# Patient Record
Sex: Male | Born: 1955
Health system: Southern US, Community
[De-identification: ages and names within clinical notes are randomized; demographics above are authoritative.]

## PROBLEM LIST (undated history)

## (undated) DIAGNOSIS — I2699 Other pulmonary embolism without acute cor pulmonale: Secondary | ICD-10-CM

## (undated) DIAGNOSIS — I469 Cardiac arrest, cause unspecified: Secondary | ICD-10-CM

## (undated) DIAGNOSIS — I499 Cardiac arrhythmia, unspecified: Secondary | ICD-10-CM

## (undated) DIAGNOSIS — J189 Pneumonia, unspecified organism: Secondary | ICD-10-CM

## (undated) DIAGNOSIS — I712 Thoracic aortic aneurysm, without rupture: Secondary | ICD-10-CM

## (undated) DIAGNOSIS — Z952 Presence of prosthetic heart valve: Secondary | ICD-10-CM

## (undated) DIAGNOSIS — I351 Nonrheumatic aortic (valve) insufficiency: Secondary | ICD-10-CM

## (undated) DIAGNOSIS — F09 Unspecified mental disorder due to known physiological condition: Secondary | ICD-10-CM

## (undated) DIAGNOSIS — Z87442 Personal history of urinary calculi: Secondary | ICD-10-CM

## (undated) HISTORY — PX: PACEMAKER INSERTION: SHX728

## (undated) HISTORY — DX: Unspecified mental disorder due to known physiological condition: F09

## (undated) HISTORY — DX: Presence of prosthetic heart valve: Z95.2

---
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
## 2017-12-31 ENCOUNTER — Emergency Department (HOSPITAL_COMMUNITY)
Admission: EM | Admit: 2017-12-31 | Discharge: 2017-12-31 | Disposition: A | Discharge status: Home / Self Care | Payer: BLUE CROSS/BLUE SHIELD | Attending: Emergency Medicine | Admitting: Emergency Medicine

## 2017-12-31 ENCOUNTER — Emergency Department (HOSPITAL_COMMUNITY): Payer: BLUE CROSS/BLUE SHIELD

## 2017-12-31 ENCOUNTER — Other Ambulatory Visit: Payer: Self-pay

## 2017-12-31 DIAGNOSIS — R1084 Generalized abdominal pain: Secondary | ICD-10-CM | POA: Diagnosis present

## 2017-12-31 DIAGNOSIS — N23 Unspecified renal colic: Secondary | ICD-10-CM | POA: Diagnosis not present

## 2017-12-31 DIAGNOSIS — S3991XA Unspecified injury of abdomen, initial encounter: Secondary | ICD-10-CM | POA: Diagnosis not present

## 2017-12-31 DIAGNOSIS — R0781 Pleurodynia: Secondary | ICD-10-CM | POA: Diagnosis not present

## 2017-12-31 DIAGNOSIS — N132 Hydronephrosis with renal and ureteral calculous obstruction: Secondary | ICD-10-CM | POA: Diagnosis not present

## 2017-12-31 LAB — CBC WITH DIFFERENTIAL/PLATELET
Abs Immature Granulocytes: 0.04 10*3/uL (ref 0.00–0.07)
Basophils Absolute: 0 10*3/uL (ref 0.0–0.1)
Basophils Relative: 0 %
Eosinophils Absolute: 0.1 10*3/uL (ref 0.0–0.5)
Eosinophils Relative: 1 %
HCT: 44.2 % (ref 39.0–52.0)
Hemoglobin: 13.8 g/dL (ref 13.0–17.0)
IMMATURE GRANULOCYTES: 0 %
Lymphocytes Relative: 12 %
Lymphs Abs: 1.1 10*3/uL (ref 0.7–4.0)
MCH: 27.1 pg (ref 26.0–34.0)
MCHC: 31.2 g/dL (ref 30.0–36.0)
MCV: 86.8 fL (ref 80.0–100.0)
Monocytes Absolute: 0.6 10*3/uL (ref 0.1–1.0)
Monocytes Relative: 7 %
Neutro Abs: 7.3 10*3/uL (ref 1.7–7.7)
Neutrophils Relative %: 80 %
Platelets: 227 10*3/uL (ref 150–400)
RBC: 5.09 MIL/uL (ref 4.22–5.81)
RDW: 13.4 % (ref 11.5–15.5)
WBC: 9.2 10*3/uL (ref 4.0–10.5)
nRBC: 0 % (ref 0.0–0.2)

## 2017-12-31 LAB — URINALYSIS, ROUTINE W REFLEX MICROSCOPIC
Bilirubin Urine: NEGATIVE
Glucose, UA: NEGATIVE mg/dL
Ketones, ur: NEGATIVE mg/dL
Leukocytes, UA: NEGATIVE
Nitrite: NEGATIVE
Protein, ur: NEGATIVE mg/dL
RBC / HPF: >50 RBC/hpf — ABNORMAL HIGH (ref 0–5)
Specific Gravity, Urine: 1.015 (ref 1.005–1.030)
pH: 7 (ref 5.0–8.0)

## 2017-12-31 LAB — LIPASE, BLOOD: Lipase: 39 U/L (ref 11–51)

## 2017-12-31 LAB — COMPREHENSIVE METABOLIC PANEL
ALT: 18 U/L (ref 0–44)
AST: 19 U/L (ref 15–41)
Albumin: 3.7 g/dL (ref 3.5–5.0)
Alkaline Phosphatase: 61 U/L (ref 38–126)
Anion gap: 8 (ref 5–15)
BUN: 10 mg/dL (ref 8–23)
CO2: 29 mmol/L (ref 22–32)
Calcium: 9.5 mg/dL (ref 8.9–10.3)
Chloride: 103 mmol/L (ref 98–111)
Creatinine, Ser: 1.31 mg/dL — ABNORMAL HIGH (ref 0.61–1.24)
GFR calc Af Amer: >60 mL/min (ref >60)
GFR calc non Af Amer: 58 mL/min — ABNORMAL LOW (ref >60)
Glucose, Bld: 110 mg/dL — ABNORMAL HIGH (ref 70–99)
Potassium: 4.7 mmol/L (ref 3.5–5.1)
Sodium: 140 mmol/L (ref 135–145)
Total Bilirubin: 0.8 mg/dL (ref 0.3–1.2)
Total Protein: 7.3 g/dL (ref 6.5–8.1)

## 2017-12-31 MED ORDER — SODIUM CHLORIDE 0.9 % IV BOLUS (SEPSIS)
1000.0000 mL | Freq: Once | INTRAVENOUS | Status: AC
  Administered 2017-12-31: 1000 mL via INTRAVENOUS

## 2017-12-31 MED ORDER — ONDANSETRON HCL 4 MG/2ML IJ SOLN
4.0000 mg | Freq: Once | INTRAMUSCULAR | Status: AC
  Administered 2017-12-31: 4 mg via INTRAVENOUS
  Filled 2017-12-31: qty 2

## 2017-12-31 MED ORDER — HYDROCODONE-ACETAMINOPHEN 5-325 MG PO TABS: 1.0000 | ORAL_TABLET | Freq: Four times a day (QID) | ORAL | 0 refills | Status: DC | PRN

## 2017-12-31 MED ORDER — FENTANYL CITRATE (PF) 100 MCG/2ML IJ SOLN
100.0000 ug | Freq: Once | INTRAMUSCULAR | Status: AC
  Administered 2017-12-31: 100 ug via INTRAVENOUS
  Filled 2017-12-31: qty 2

## 2017-12-31 MED ORDER — HYDROCODONE-ACETAMINOPHEN 5-325 MG PO TABS
1.0000 | ORAL_TABLET | Freq: Once | ORAL | Status: AC
  Administered 2017-12-31: 1 via ORAL
  Filled 2017-12-31: qty 1

## 2017-12-31 MED ORDER — ONDANSETRON 8 MG PO TBDP: ORAL_TABLET | ORAL | 0 refills | Status: DC

## 2017-12-31 NOTE — ED Provider Notes (Signed)
[facility] EMERGENCY DEPARTMENT Provider Note   CSN: [id number] Arrival date & time: 12/31/17  5997     History   Chief Complaint Chief Complaint - flank pain  HPI Kevin Rojas is a 62 y.o. male.  The history is provided by the patient and the spouse.  Flank Pain  This is a new problem. The current episode started 3 to 5 hours ago. The problem occurs constantly. The problem has been gradually improving. Pertinent negatives include no shortness of breath. Nothing aggravates the symptoms. Nothing relieves the symptoms. He has tried acetaminophen for the symptoms. The treatment provided mild relief.   Reports onset of right side pain several hours ago. Starts in his right side and goes into his abdomen.  Also feels it may be coming from his ribs.  No fevers or vomiting.  He does report mild nausea.  No urinary symptoms.  He has had some recent cough, so he thought he might have strained his chest wall  PMH-none Soc hx - no drug abuse Home Medications    Prior to Admission medications   Medication Sig Start Date End Date Taking? Authorizing Provider  acetaminophen (TYLENOL) 500 MG tablet Take 1,000 mg by mouth every 6 (six) hours as needed for mild pain.   Yes [provider]  ELDERBERRY PO Take 1 Applicatorful by mouth daily as needed (cold).   Yes [provider]    Family History No family history on file.  Social History Social History   Tobacco Use  . Smoking status: Not on file  Substance Use Topics  . Alcohol use: Not on file  . Drug use: Not on file     Allergies   Patient has no known allergies.   Review of Systems Review of Systems  Constitutional: Negative for fever.  Respiratory: Negative for shortness of breath.   Gastrointestinal: Positive for nausea. Negative for vomiting.  Genitourinary: Positive for flank pain. Negative for dysuria.  All other systems reviewed and are negative.    Physical Exam Updated  Vital Signs BP (!) 154/79   Pulse (!) 53   Temp 97.7 F (36.5 C) (Oral)   Resp 16   Ht 1.753 m (5\' 9" )   Wt 70.3 kg   SpO2 98%   BMI 22.89 kg/m   Physical Exam CONSTITUTIONAL: Well developed/well nourished HEAD: Normocephalic/atraumatic EYES: EOMI/PERRL ENMT: Mucous membranes moist NECK: supple no meningeal signs SPINE/BACK:entire spine nontender CV: S1/S2 noted, no murmurs/rubs/gallops noted LUNGS: Lungs are clear to auscultation bilaterally, no apparent distress Chest-mild tenderness to right lower costal margin, no crepitus or bruising ABDOMEN: soft, nontender, no rebound or guarding, bowel sounds noted throughout abdomen GU:no cva tenderness NEURO: Pt is awake/alert/appropriate, moves all extremitiesx4.  No facial droop.   EXTREMITIES: pulses normal/equal, full ROM SKIN: warm, color normal PSYCH: no abnormalities of mood noted, alert and oriented to situation   ED Treatments / Results  Labs (all labs ordered are listed, but only abnormal results are displayed) Labs Reviewed  COMPREHENSIVE METABOLIC PANEL - Abnormal; Notable for the following components:      Result Value   Glucose, Bld 110 (*)    Creatinine, Ser 1.31 (*)    GFR calc non Af Amer 58 (*)    All other components within normal limits  URINALYSIS, ROUTINE W REFLEX MICROSCOPIC - Abnormal; Notable for the following components:   APPearance HAZY (*)    Hgb urine dipstick LARGE (*)    RBC / HPF >50 (*)  Bacteria, UA RARE (*)    All other components within normal limits  URINE CULTURE  CBC WITH DIFFERENTIAL/PLATELET  LIPASE, BLOOD    EKG EKG Interpretation  Date/Time:  Wednesday December 31 2017 03:54:29 EST Ventricular Rate:  56 PR Interval:    QRS Duration: 148 QT Interval:  480 QTC Calculation: 464 R Axis:   -104 Text Interpretation:  Sinus rhythm Nonspecific IVCD with LAD Confirmed by Zadie RhineWickline, Ninoshka Wainwright ([id number]) on 12/31/2017 5:33:05 AM   Radiology Dg Chest 2 View  Result Date:  12/31/2017 CLINICAL DATA:  Acute onset of right-sided rib pain. Status post fall a year ago. EXAM: CHEST - 2 VIEW COMPARISON:  None. FINDINGS: The lungs are well-aerated and clear. There is no evidence of focal opacification, pleural effusion or pneumothorax. The heart is normal in size; the mediastinal contour is within normal limits. No acute osseous abnormalities are seen. IMPRESSION: No acute cardiopulmonary process seen. No displaced rib fractures identified. Electronically Signed   By: Roanna RaiderJeffery  Chang M.D.   On: 12/31/2017 04:19   Ct Renal Stone Study  Result Date: 12/31/2017 CLINICAL DATA:  Right flank pain. Recent injury. Nausea. EXAM: CT ABDOMEN AND PELVIS WITHOUT CONTRAST TECHNIQUE: Multidetector CT imaging of the abdomen and pelvis was performed following the standard protocol without IV contrast. COMPARISON:  None. FINDINGS: Lower chest: Mild bibasilar atelectasis. Normal heart size without pericardial or pleural effusion. Hepatobiliary: Normal liver. Normal gallbladder, without biliary ductal dilatation. Pancreas: Normal, without mass or ductal dilatation. Spleen: Normal in size, without focal abnormality. Adrenals/Urinary Tract: Normal adrenal glands. Left renal collecting system calculi of maximally 4 mm. Interpolar right renal 3.4 cm fluid density lesion is likely a cyst. Mild right-sided hydroureteronephrosis with perinephric edema. Right hydroureter continues to the level of a 5 mm stone at the ureterovesicular junction. Stomach/Bowel: Normal stomach, without wall thickening. Normal colon, appendix, and terminal ileum. Normal small bowel. Vascular/Lymphatic: Normal caliber of the aorta and branch vessels. No abdominopelvic adenopathy. Reproductive: Mild prostatomegaly. Other: No significant free fluid. Musculoskeletal: Degenerative partial fusion of the left sacroiliac joint. Lumbosacral spondylosis primarily at L4-5 and L5-S1. IMPRESSION: 1. Mild right-sided hydroureteronephrosis secondary to  a 5 mm stone at the right ureterovesicular junction. 2. Left nephrolithiasis. Electronically Signed   By: Jeronimo GreavesKyle  Talbot M.D.   On: 12/31/2017 07:50    Procedures Procedures   Medications Ordered in ED Medications  HYDROcodone-acetaminophen (NORCO/VICODIN) 5-325 MG per tablet 1 tablet (1 tablet Oral Given 12/31/17 0555)  ondansetron (ZOFRAN) injection 4 mg (4 mg Intravenous Given 12/31/17 0650)  fentaNYL (SUBLIMAZE) injection 100 mcg (100 mcg Intravenous Given 12/31/17 0650)  sodium chloride 0.9 % bolus 1,000 mL (1,000 mLs Intravenous New Bag/Given 12/31/17 0650)     Initial Impression / Assessment and Plan / ED Course  I have reviewed the triage vital signs and the nursing notes.  Pertinent labs & imaging results that were available during my care of the patient were reviewed by me and considered in my medical decision making (see chart for details).     5:52 AM The time of my initial evaluation patient's pain was improving.  History was a bit challenging, and he had some pain in the right lower costal margin but also reported flank pain Initial labs and imaging negative.  EKG is abnormal, but none to compare.  Low suspicion for STEMI or ACS After urinating, he had onset of similar type pain.  Suspicious for potential ureteral stone.  Urinalysis pending at this time 7:54 AM Patient now improved.  He  is awake alert.  CT imaging confirmed stone, but his pain is now controlled.  Will treat with pain medicines and antiemetics.  He is otherwise appropriate for discharge home Final Clinical Impressions(s) / ED Diagnoses   Final diagnoses:  Ureteral colic    ED Discharge Orders         Ordered    HYDROcodone-acetaminophen (NORCO/VICODIN) 5-325 MG tablet  Every 6 hours PRN     12/31/17 0752    ondansetron (ZOFRAN ODT) 8 MG disintegrating tablet     12/31/17 0752           Zadie Rhine, MD 12/31/17 [phone]

## 2017-12-31 NOTE — ED Triage Notes (Signed)
Pt presents to ED with right sided pain that he states feels like is coming from a muscle or his ribs that started hat 1230-0100 this morning. Has had previous injury to that area. Took 2 Tylenol at home with some improvement. Endorses some nausea, denies urinary symptoms.

## 2017-12-31 NOTE — ED Notes (Signed)
Pt alert and oriented in NAD. Pt verbalized understanding of discharge instructions. 

## 2018-01-01 LAB — URINE CULTURE: Culture: NO GROWTH

## 2018-01-06 DIAGNOSIS — R030 Elevated blood-pressure reading, without diagnosis of hypertension: Secondary | ICD-10-CM | POA: Diagnosis not present

## 2018-01-06 DIAGNOSIS — J209 Acute bronchitis, unspecified: Secondary | ICD-10-CM | POA: Diagnosis not present

## 2018-03-24 ENCOUNTER — Encounter (HOSPITAL_COMMUNITY): Payer: Self-pay | Admitting: Emergency Medicine

## 2018-03-24 ENCOUNTER — Emergency Department (HOSPITAL_COMMUNITY): Payer: BLUE CROSS/BLUE SHIELD

## 2018-03-24 ENCOUNTER — Inpatient Hospital Stay (HOSPITAL_COMMUNITY): Payer: BLUE CROSS/BLUE SHIELD

## 2018-03-24 ENCOUNTER — Inpatient Hospital Stay (HOSPITAL_COMMUNITY)
Admission: EM | Admit: 2018-03-24 | Discharge: 2018-04-05 | DRG: 280 | Disposition: A | Discharge status: Rehabilitation Facility | Payer: BLUE CROSS/BLUE SHIELD | Attending: Internal Medicine | Admitting: Internal Medicine

## 2018-03-24 DIAGNOSIS — R5381 Other malaise: Secondary | ICD-10-CM | POA: Diagnosis not present

## 2018-03-24 DIAGNOSIS — I4901 Ventricular fibrillation: Principal | ICD-10-CM | POA: Diagnosis present

## 2018-03-24 DIAGNOSIS — N179 Acute kidney failure, unspecified: Secondary | ICD-10-CM | POA: Diagnosis present

## 2018-03-24 DIAGNOSIS — E872 Acidosis: Secondary | ICD-10-CM | POA: Diagnosis not present

## 2018-03-24 DIAGNOSIS — I77810 Thoracic aortic ectasia: Secondary | ICD-10-CM | POA: Diagnosis not present

## 2018-03-24 DIAGNOSIS — R570 Cardiogenic shock: Secondary | ICD-10-CM | POA: Diagnosis not present

## 2018-03-24 DIAGNOSIS — I469 Cardiac arrest, cause unspecified: Secondary | ICD-10-CM

## 2018-03-24 DIAGNOSIS — R4020 Unspecified coma: Secondary | ICD-10-CM | POA: Diagnosis not present

## 2018-03-24 DIAGNOSIS — Z452 Encounter for adjustment and management of vascular access device: Secondary | ICD-10-CM

## 2018-03-24 DIAGNOSIS — N183 Chronic kidney disease, stage 3 (moderate): Secondary | ICD-10-CM | POA: Diagnosis present

## 2018-03-24 DIAGNOSIS — E162 Hypoglycemia, unspecified: Secondary | ICD-10-CM | POA: Diagnosis not present

## 2018-03-24 DIAGNOSIS — J9601 Acute respiratory failure with hypoxia: Secondary | ICD-10-CM | POA: Diagnosis not present

## 2018-03-24 DIAGNOSIS — I214 Non-ST elevation (NSTEMI) myocardial infarction: Secondary | ICD-10-CM | POA: Diagnosis present

## 2018-03-24 DIAGNOSIS — T17908A Unspecified foreign body in respiratory tract, part unspecified causing other injury, initial encounter: Secondary | ICD-10-CM

## 2018-03-24 DIAGNOSIS — Z978 Presence of other specified devices: Secondary | ICD-10-CM | POA: Diagnosis not present

## 2018-03-24 DIAGNOSIS — J069 Acute upper respiratory infection, unspecified: Secondary | ICD-10-CM | POA: Diagnosis present

## 2018-03-24 DIAGNOSIS — Z8042 Family history of malignant neoplasm of prostate: Secondary | ICD-10-CM

## 2018-03-24 DIAGNOSIS — R7989 Other specified abnormal findings of blood chemistry: Secondary | ICD-10-CM | POA: Diagnosis not present

## 2018-03-24 DIAGNOSIS — R001 Bradycardia, unspecified: Secondary | ICD-10-CM | POA: Diagnosis not present

## 2018-03-24 DIAGNOSIS — J690 Pneumonitis due to inhalation of food and vomit: Secondary | ICD-10-CM | POA: Diagnosis present

## 2018-03-24 DIAGNOSIS — I2699 Other pulmonary embolism without acute cor pulmonale: Secondary | ICD-10-CM | POA: Diagnosis not present

## 2018-03-24 DIAGNOSIS — I5043 Acute on chronic combined systolic (congestive) and diastolic (congestive) heart failure: Secondary | ICD-10-CM | POA: Diagnosis not present

## 2018-03-24 DIAGNOSIS — I351 Nonrheumatic aortic (valve) insufficiency: Secondary | ICD-10-CM | POA: Diagnosis not present

## 2018-03-24 DIAGNOSIS — F09 Unspecified mental disorder due to known physiological condition: Secondary | ICD-10-CM | POA: Diagnosis not present

## 2018-03-24 DIAGNOSIS — Z01810 Encounter for preprocedural cardiovascular examination: Secondary | ICD-10-CM | POA: Diagnosis not present

## 2018-03-24 DIAGNOSIS — I248 Other forms of acute ischemic heart disease: Secondary | ICD-10-CM | POA: Diagnosis not present

## 2018-03-24 DIAGNOSIS — I447 Left bundle-branch block, unspecified: Secondary | ICD-10-CM | POA: Diagnosis not present

## 2018-03-24 DIAGNOSIS — R739 Hyperglycemia, unspecified: Secondary | ICD-10-CM | POA: Diagnosis present

## 2018-03-24 DIAGNOSIS — G931 Anoxic brain damage, not elsewhere classified: Secondary | ICD-10-CM | POA: Diagnosis not present

## 2018-03-24 DIAGNOSIS — Z9911 Dependence on respirator [ventilator] status: Secondary | ICD-10-CM | POA: Diagnosis not present

## 2018-03-24 DIAGNOSIS — I2693 Single subsegmental pulmonary embolism without acute cor pulmonale: Secondary | ICD-10-CM | POA: Diagnosis not present

## 2018-03-24 DIAGNOSIS — R9389 Abnormal findings on diagnostic imaging of other specified body structures: Secondary | ICD-10-CM | POA: Diagnosis not present

## 2018-03-24 DIAGNOSIS — R778 Other specified abnormalities of plasma proteins: Secondary | ICD-10-CM

## 2018-03-24 DIAGNOSIS — R404 Transient alteration of awareness: Secondary | ICD-10-CM | POA: Diagnosis not present

## 2018-03-24 DIAGNOSIS — Z8674 Personal history of sudden cardiac arrest: Secondary | ICD-10-CM | POA: Diagnosis not present

## 2018-03-24 DIAGNOSIS — K567 Ileus, unspecified: Secondary | ICD-10-CM | POA: Diagnosis not present

## 2018-03-24 DIAGNOSIS — E876 Hypokalemia: Secondary | ICD-10-CM | POA: Diagnosis present

## 2018-03-24 DIAGNOSIS — Q231 Congenital insufficiency of aortic valve: Secondary | ICD-10-CM | POA: Diagnosis not present

## 2018-03-24 DIAGNOSIS — I499 Cardiac arrhythmia, unspecified: Secondary | ICD-10-CM | POA: Diagnosis not present

## 2018-03-24 DIAGNOSIS — Z931 Gastrostomy status: Secondary | ICD-10-CM | POA: Diagnosis not present

## 2018-03-24 DIAGNOSIS — J9690 Respiratory failure, unspecified, unspecified whether with hypoxia or hypercapnia: Secondary | ICD-10-CM | POA: Diagnosis not present

## 2018-03-24 DIAGNOSIS — I714 Abdominal aortic aneurysm, without rupture: Secondary | ICD-10-CM | POA: Diagnosis not present

## 2018-03-24 DIAGNOSIS — Z79899 Other long term (current) drug therapy: Secondary | ICD-10-CM | POA: Diagnosis not present

## 2018-03-24 DIAGNOSIS — J9600 Acute respiratory failure, unspecified whether with hypoxia or hypercapnia: Secondary | ICD-10-CM | POA: Diagnosis not present

## 2018-03-24 DIAGNOSIS — Z87442 Personal history of urinary calculi: Secondary | ICD-10-CM | POA: Diagnosis not present

## 2018-03-24 DIAGNOSIS — I712 Thoracic aortic aneurysm, without rupture: Secondary | ICD-10-CM | POA: Diagnosis present

## 2018-03-24 DIAGNOSIS — R0600 Dyspnea, unspecified: Secondary | ICD-10-CM

## 2018-03-24 DIAGNOSIS — I361 Nonrheumatic tricuspid (valve) insufficiency: Secondary | ICD-10-CM | POA: Diagnosis not present

## 2018-03-24 DIAGNOSIS — K224 Dyskinesia of esophagus: Secondary | ICD-10-CM | POA: Diagnosis not present

## 2018-03-24 DIAGNOSIS — R918 Other nonspecific abnormal finding of lung field: Secondary | ICD-10-CM | POA: Diagnosis not present

## 2018-03-24 DIAGNOSIS — Z4659 Encounter for fitting and adjustment of other gastrointestinal appliance and device: Secondary | ICD-10-CM

## 2018-03-24 DIAGNOSIS — K5900 Constipation, unspecified: Secondary | ICD-10-CM | POA: Diagnosis not present

## 2018-03-24 HISTORY — DX: Abnormal findings on diagnostic imaging of other specified body structures: R93.89

## 2018-03-24 HISTORY — DX: Left bundle-branch block, unspecified: I44.7

## 2018-03-24 HISTORY — DX: Cardiac arrest, cause unspecified: I46.9

## 2018-03-24 HISTORY — DX: Presence of other specified devices: Z97.8

## 2018-03-24 HISTORY — DX: Ventricular fibrillation: I49.01

## 2018-03-24 HISTORY — DX: Other specified abnormal findings of blood chemistry: R79.89

## 2018-03-24 HISTORY — DX: Hypokalemia: E87.6

## 2018-03-24 HISTORY — DX: Other specified abnormalities of plasma proteins: R77.8

## 2018-03-24 LAB — CBC WITH DIFFERENTIAL/PLATELET
Abs Immature Granulocytes: 0.09 10*3/uL — ABNORMAL HIGH (ref 0.00–0.07)
Basophils Absolute: 0 10*3/uL (ref 0.0–0.1)
Basophils Relative: 0 %
Eosinophils Absolute: 0.1 10*3/uL (ref 0.0–0.5)
Eosinophils Relative: 1 %
HCT: 45.2 % (ref 39.0–52.0)
Hemoglobin: 14.1 g/dL (ref 13.0–17.0)
IMMATURE GRANULOCYTES: 1 %
Lymphocytes Relative: 28 %
Lymphs Abs: 2.9 10*3/uL (ref 0.7–4.0)
MCH: 27.3 pg (ref 26.0–34.0)
MCHC: 31.2 g/dL (ref 30.0–36.0)
MCV: 87.6 fL (ref 80.0–100.0)
Monocytes Absolute: 0.6 10*3/uL (ref 0.1–1.0)
Monocytes Relative: 6 %
NEUTROS ABS: 6.8 10*3/uL (ref 1.7–7.7)
Neutrophils Relative %: 64 %
Platelets: 224 10*3/uL (ref 150–400)
RBC: 5.16 MIL/uL (ref 4.22–5.81)
RDW: 14 % (ref 11.5–15.5)
WBC: 10.5 10*3/uL (ref 4.0–10.5)
nRBC: 0 % (ref 0.0–0.2)

## 2018-03-24 LAB — POCT I-STAT 7, (LYTES, BLD GAS, ICA,H+H)
Acid-base deficit: 8 mmol/L — ABNORMAL HIGH (ref 0.0–2.0)
Acid-base deficit: 6 mmol/L — ABNORMAL HIGH (ref 0.0–2.0)
BICARBONATE: 16.4 mmol/L — AB (ref 20.0–28.0)
Bicarbonate: 20.7 mmol/L (ref 20.0–28.0)
Calcium, Ion: 1.17 mmol/L (ref 1.15–1.40)
Calcium, Ion: 1.07 mmol/L — ABNORMAL LOW (ref 1.15–1.40)
HCT: 39 % (ref 39.0–52.0)
HCT: 41 % (ref 39.0–52.0)
Hemoglobin: 13.3 g/dL (ref 13.0–17.0)
Hemoglobin: 13.9 g/dL (ref 13.0–17.0)
O2 Saturation: 98 %
O2 Saturation: 95 %
POTASSIUM: 3.9 mmol/L (ref 3.5–5.1)
Patient temperature: 95.3
Patient temperature: 94.5
Potassium: 3.9 mmol/L (ref 3.5–5.1)
SODIUM: 140 mmol/L (ref 135–145)
Sodium: 141 mmol/L (ref 135–145)
TCO2: 22 mmol/L (ref 22–32)
TCO2: 17 mmol/L — ABNORMAL LOW (ref 22–32)
pCO2 arterial: 49.9 mmHg — ABNORMAL HIGH (ref 32.0–48.0)
pCO2 arterial: 23 mmHg — ABNORMAL LOW (ref 32.0–48.0)
pH, Arterial: 7.215 — ABNORMAL LOW (ref 7.350–7.450)
pH, Arterial: 7.451 — ABNORMAL HIGH (ref 7.350–7.450)
pO2, Arterial: 111 mmHg — ABNORMAL HIGH (ref 83.0–108.0)
pO2, Arterial: 62 mmHg — ABNORMAL LOW (ref 83.0–108.0)

## 2018-03-24 LAB — CBC
HEMATOCRIT: 44.1 % (ref 39.0–52.0)
Hemoglobin: 14.3 g/dL (ref 13.0–17.0)
MCH: 28.2 pg (ref 26.0–34.0)
MCHC: 32.4 g/dL (ref 30.0–36.0)
MCV: 87 fL (ref 80.0–100.0)
Platelets: 212 10*3/uL (ref 150–400)
RBC: 5.07 MIL/uL (ref 4.22–5.81)
RDW: 14.1 % (ref 11.5–15.5)
WBC: 11.2 10*3/uL — ABNORMAL HIGH (ref 4.0–10.5)
nRBC: 0 % (ref 0.0–0.2)

## 2018-03-24 LAB — APTT: aPTT: 26 seconds (ref 24–36)

## 2018-03-24 LAB — HEPATIC FUNCTION PANEL
ALT: 232 U/L — ABNORMAL HIGH (ref 0–44)
AST: 250 U/L — ABNORMAL HIGH (ref 15–41)
Albumin: 3.4 g/dL — ABNORMAL LOW (ref 3.5–5.0)
Alkaline Phosphatase: 68 U/L (ref 38–126)
Bilirubin, Direct: 0.2 mg/dL (ref 0.0–0.2)
Indirect Bilirubin: 0.9 mg/dL (ref 0.3–0.9)
Total Bilirubin: 1.1 mg/dL (ref 0.3–1.2)
Total Protein: 6.7 g/dL (ref 6.5–8.1)

## 2018-03-24 LAB — BASIC METABOLIC PANEL
ANION GAP: 12 (ref 5–15)
BUN: 14 mg/dL (ref 8–23)
CO2: 19 mmol/L — ABNORMAL LOW (ref 22–32)
Calcium: 8.4 mg/dL — ABNORMAL LOW (ref 8.9–10.3)
Chloride: 107 mmol/L (ref 98–111)
Creatinine, Ser: 1.5 mg/dL — ABNORMAL HIGH (ref 0.61–1.24)
GFR calc Af Amer: 57 mL/min — ABNORMAL LOW (ref >60)
GFR calc non Af Amer: 49 mL/min — ABNORMAL LOW (ref >60)
Glucose, Bld: 206 mg/dL — ABNORMAL HIGH (ref 70–99)
Potassium: 3.2 mmol/L — ABNORMAL LOW (ref 3.5–5.1)
Sodium: 138 mmol/L (ref 135–145)

## 2018-03-24 LAB — HEMOGLOBIN A1C
Hgb A1c MFr Bld: 5.3 % (ref 4.8–5.6)
Mean Plasma Glucose: 105.41 mg/dL

## 2018-03-24 LAB — I-STAT TROPONIN, ED: Troponin i, poc: 0.11 ng/mL (ref 0.00–0.08)

## 2018-03-24 LAB — PROTIME-INR
INR: 1.2 (ref 0.8–1.2)
Prothrombin Time: 15.4 seconds — ABNORMAL HIGH (ref 11.4–15.2)

## 2018-03-24 LAB — PHOSPHORUS: PHOSPHORUS: 3.5 mg/dL (ref 2.5–4.6)

## 2018-03-24 LAB — TROPONIN I: Troponin I: 0.04 ng/mL (ref <0.03)

## 2018-03-24 LAB — MAGNESIUM: Magnesium: 2.2 mg/dL (ref 1.7–2.4)

## 2018-03-24 MED ORDER — AMIODARONE HCL IN DEXTROSE 360-4.14 MG/200ML-% IV SOLN
INTRAVENOUS | Status: AC
  Administered 2018-03-24: 150 mg via INTRAVENOUS
  Filled 2018-03-24: qty 200

## 2018-03-24 MED ORDER — PANTOPRAZOLE SODIUM 40 MG IV SOLR
40.0000 mg | Freq: Every day | INTRAVENOUS | Status: DC
  Administered 2018-03-25 – 2018-03-31 (×8): 40 mg via INTRAVENOUS
  Filled 2018-03-24 (×7): qty 40

## 2018-03-24 MED ORDER — SODIUM CHLORIDE 0.9 % IV SOLN
1.0000 ug/kg/min | INTRAVENOUS | Status: DC
  Administered 2018-03-24 – 2018-03-25 (×2): 1 ug/kg/min via INTRAVENOUS
  Filled 2018-03-24 (×2): qty 20

## 2018-03-24 MED ORDER — SUCCINYLCHOLINE CHLORIDE 20 MG/ML IJ SOLN
INTRAMUSCULAR | Status: AC | PRN
  Administered 2018-03-24: 100 mg via INTRAVENOUS

## 2018-03-24 MED ORDER — SODIUM CHLORIDE 0.9 % IV SOLN
500.0000 mg | INTRAVENOUS | Status: DC
  Administered 2018-03-25: 500 mg via INTRAVENOUS
  Filled 2018-03-24: qty 500

## 2018-03-24 MED ORDER — PROPOFOL 1000 MG/100ML IV EMUL
INTRAVENOUS | Status: AC
  Filled 2018-03-24: qty 100

## 2018-03-24 MED ORDER — POTASSIUM CHLORIDE 10 MEQ/50ML IV SOLN
10.0000 meq | INTRAVENOUS | Status: AC
  Administered 2018-03-25 (×4): 10 meq via INTRAVENOUS
  Filled 2018-03-24 (×4): qty 50

## 2018-03-24 MED ORDER — FENTANYL BOLUS VIA INFUSION
50.0000 ug | INTRAVENOUS | Status: DC | PRN
  Administered 2018-03-27 – 2018-03-28 (×2): 50 ug via INTRAVENOUS
  Filled 2018-03-24: qty 50

## 2018-03-24 MED ORDER — SODIUM CHLORIDE 0.9 % IV SOLN
1.0000 g | INTRAVENOUS | Status: DC
  Filled 2018-03-24: qty 10

## 2018-03-24 MED ORDER — IOHEXOL 350 MG/ML SOLN
100.0000 mL | Freq: Once | INTRAVENOUS | Status: AC | PRN
  Administered 2018-03-24: 70 mL via INTRAVENOUS

## 2018-03-24 MED ORDER — SODIUM CHLORIDE 0.9 % IV SOLN
3.0000 g | Freq: Four times a day (QID) | INTRAVENOUS | Status: DC
  Administered 2018-03-25: 3 g via INTRAVENOUS
  Filled 2018-03-24 (×4): qty 3

## 2018-03-24 MED ORDER — FENTANYL CITRATE (PF) 100 MCG/2ML IJ SOLN
INTRAMUSCULAR | Status: AC
  Filled 2018-03-24: qty 2

## 2018-03-24 MED ORDER — AMIODARONE LOAD VIA INFUSION
150.0000 mg | Freq: Once | INTRAVENOUS | Status: AC
  Administered 2018-03-24: 150 mg via INTRAVENOUS
  Filled 2018-03-24: qty 83.34

## 2018-03-24 MED ORDER — FENTANYL 2500MCG IN NS 250ML (10MCG/ML) PREMIX INFUSION
100.0000 ug/h | INTRAVENOUS | Status: DC
  Administered 2018-03-24: 100 ug/h via INTRAVENOUS
  Administered 2018-03-25 – 2018-03-26 (×3): 200 ug/h via INTRAVENOUS
  Filled 2018-03-24 (×5): qty 250

## 2018-03-24 MED ORDER — INSULIN ASPART 100 UNIT/ML ~~LOC~~ SOLN
0.0000 [IU] | SUBCUTANEOUS | Status: DC
  Administered 2018-03-25 (×2): 2 [IU] via SUBCUTANEOUS

## 2018-03-24 MED ORDER — CISATRACURIUM BOLUS VIA INFUSION
0.0500 mg/kg | INTRAVENOUS | Status: DC | PRN
  Filled 2018-03-24: qty 4

## 2018-03-24 MED ORDER — MIDAZOLAM HCL 2 MG/2ML IJ SOLN
2.0000 mg | Freq: Once | INTRAMUSCULAR | Status: AC
  Administered 2018-03-24: 2 mg via INTRAVENOUS

## 2018-03-24 MED ORDER — NOREPINEPHRINE 4 MG/250ML-% IV SOLN
0.0000 ug/min | INTRAVENOUS | Status: DC
  Administered 2018-03-24: 10 ug/min via INTRAVENOUS
  Administered 2018-03-25: 12 ug/min via INTRAVENOUS
  Administered 2018-03-25: 8 ug/min via INTRAVENOUS
  Administered 2018-03-26: 12 ug/min via INTRAVENOUS
  Administered 2018-03-26: 10 ug/min via INTRAVENOUS
  Administered 2018-03-26: 6 ug/min via INTRAVENOUS
  Filled 2018-03-24 (×6): qty 250

## 2018-03-24 MED ORDER — ASPIRIN 300 MG RE SUPP: 300.0000 mg | RECTAL | Status: DC

## 2018-03-24 MED ORDER — SODIUM CHLORIDE 0.9 % IV BOLUS
1000.0000 mL | Freq: Once | INTRAVENOUS | Status: AC
  Administered 2018-03-24: 1000 mL via INTRAVENOUS

## 2018-03-24 MED ORDER — MIDAZOLAM BOLUS VIA INFUSION
2.0000 mg | INTRAVENOUS | Status: DC | PRN
  Administered 2018-03-27: 2 mg via INTRAVENOUS
  Filled 2018-03-24 (×2): qty 2

## 2018-03-24 MED ORDER — AMIODARONE HCL IN DEXTROSE 360-4.14 MG/200ML-% IV SOLN: 30.0000 mg/h | INTRAVENOUS | Status: DC

## 2018-03-24 MED ORDER — PROPOFOL 1000 MG/100ML IV EMUL
5.0000 ug/kg/min | INTRAVENOUS | Status: DC
  Administered 2018-03-24: 30 ug/kg/min via INTRAVENOUS

## 2018-03-24 MED ORDER — CISATRACURIUM BOLUS VIA INFUSION
0.1000 mg/kg | Freq: Once | INTRAVENOUS | Status: AC
  Administered 2018-03-24: 7 mg via INTRAVENOUS
  Filled 2018-03-24: qty 7

## 2018-03-24 MED ORDER — FENTANYL CITRATE (PF) 100 MCG/2ML IJ SOLN
100.0000 ug | Freq: Once | INTRAMUSCULAR | Status: AC
  Administered 2018-03-24 (×2): 100 ug via INTRAVENOUS

## 2018-03-24 MED ORDER — SODIUM CHLORIDE 0.9 % IV SOLN
INTRAVENOUS | Status: DC
  Administered 2018-03-25 – 2018-03-26 (×3): via INTRAVENOUS

## 2018-03-24 MED ORDER — ARTIFICIAL TEARS OPHTHALMIC OINT
1.0000 "application " | TOPICAL_OINTMENT | Freq: Three times a day (TID) | OPHTHALMIC | Status: DC
  Administered 2018-03-25 – 2018-03-26 (×6): 1 via OPHTHALMIC
  Filled 2018-03-24 (×2): qty 3.5

## 2018-03-24 MED ORDER — MIDAZOLAM HCL 2 MG/2ML IJ SOLN
INTRAMUSCULAR | Status: AC
  Filled 2018-03-24: qty 2

## 2018-03-24 MED ORDER — ASPIRIN 300 MG RE SUPP
300.0000 mg | RECTAL | Status: AC
  Administered 2018-03-24: 300 mg via RECTAL
  Filled 2018-03-24: qty 1

## 2018-03-24 MED ORDER — MIDAZOLAM 50MG/50ML (1MG/ML) PREMIX INFUSION
2.0000 mg/h | INTRAVENOUS | Status: DC
  Administered 2018-03-24: 2 mg/h via INTRAVENOUS
  Administered 2018-03-25 – 2018-03-26 (×7): 8 mg/h via INTRAVENOUS
  Administered 2018-03-27: 4 mg/h via INTRAVENOUS
  Filled 2018-03-24 (×9): qty 50

## 2018-03-24 MED ORDER — AMIODARONE HCL IN DEXTROSE 360-4.14 MG/200ML-% IV SOLN
60.0000 mg/h | INTRAVENOUS | Status: DC
  Administered 2018-03-24: 60 mg/h via INTRAVENOUS
  Filled 2018-03-24: qty 200

## 2018-03-24 MED ORDER — HEPARIN SODIUM (PORCINE) 5000 UNIT/ML IJ SOLN
5000.0000 [IU] | Freq: Three times a day (TID) | INTRAMUSCULAR | Status: DC
  Administered 2018-03-25: 5000 [IU] via SUBCUTANEOUS
  Filled 2018-03-24: qty 1

## 2018-03-24 MED ORDER — ETOMIDATE 2 MG/ML IV SOLN
INTRAVENOUS | Status: AC | PRN
  Administered 2018-03-24: 20 mg via INTRAVENOUS

## 2018-03-24 MED ORDER — FENTANYL CITRATE (PF) 100 MCG/2ML IJ SOLN
INTRAMUSCULAR | Status: AC
  Administered 2018-03-24: 100 ug via INTRAVENOUS
  Filled 2018-03-24: qty 2

## 2018-03-24 NOTE — Procedures (Signed)
Central Venous Catheter Insertion Procedure Note  Procedure: Insertion of Central Venous Catheter  Indications:  vascular access, need for frequent blood draws, targeted temperature management  Procedure Details  Informed consent could not be obtained prior to the procedure due to emergent conditions.   Maximum sterile technique was used including antiseptics, cap, gloves, gown, hand hygiene, mask and sheet.  Under sterile conditions the skin above the midline chest, at base of throat, on the left subclavian vein was prepped with betadine and covered with a sterile drape. Local anesthesia was applied to the skin and subcutaneous tissues. A 22-gauge needle was used to identify the vein. An 18-gauge needle was then inserted into the vein. A guide wire was then passed easily through the catheter. There were no arrhythmias, occasional PVCs. The catheter was then withdrawn. A 7.0 French triple-lumen was then inserted into the vessel over the guide wire. The catheter was sutured into place.  Findings: There were no changes to vital signs. Catheter was flushed with 10 cc NS. Patient did tolerate procedure well.  Recommendations: CXR ordered to verify placement.  Marcelle Smiling, MD Board Certified by the ABIM, Pulmonary Diseases & Critical Care Medicine

## 2018-03-24 NOTE — Consult Note (Signed)
CARDIOLOGY CONSULT NOTE   Referring Physician: Dr. Ardeth Perfect Primary Physician: Dr [name] Primary Cardiologist: None Reason for Consultation:  Cardiac arrest  HPI: Tykeem Zaya is a 63 y.o. male w/ history of kidney stone, heart murmur, LBBB presents with cardiac arrest.   History is obtained from the patient's wife, who is at the bedside.  Patient was at home today in his usual state of health.  Was sitting on the couch when wife walked out of the room to use the telephone.  Upon returning about 10 minutes later she noticed her husband slumped over on the couch unresponsive.  She started CPR and called EMS.  On EMS arrival he was found to be in a shockable rhythm that was thought to be VT.  He was shocked several times for VT and then several times for VF.  He was brought to the emergency department for work-up.  The patient's wife states that the patient has no known history of cardiovascular disease.  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  He does not drink or use any illicit substances.  He has never smoked cigarettes.  The patient's wife does state that the patient has had " the sniffles" for a few days but has not had any fevers or other overt symptoms.  She does note that he reported some transient chest discomfort yesterday but thought that this was reflux.  He has no history of exertional chest symptoms.  On arrival to the emergency department, the patient's ECG revealed normal sinus rhythm with a left bundle branch block consistent with his prior ECGs from several months prior.  He was intubated for airway protection given his poor mental status and targeted temperature management protocol was initiated.  Review of Systems:     Cardiac Review of Systems: {Y] = yes [ ]  = no  Chest Pain [    ]  Resting SOB [   ] Exertional SOB  [  ]  Orthopnea [  ]   Pedal Edema [   ]    Palpitations [  ] Syncope  [  ]   Presyncope [   ]  General Review of Systems: [Y] =  yes [  ]=no Constitional: recent weight change [  ]; anorexia [  ]; fatigue [  ]; nausea [  ]; night sweats [  ]; fever [  ]; or chills [  ];                                                                     Eyes : blurred vision [  ]; diplopia [   ]; vision changes [  ];  Amaurosis fugax[  ]; Resp: cough [  ];  wheezing[  ];  hemoptysis[  ];  PND [  ];  GI:  gallstones[  ], vomiting[  ];  dysphagia[  ]; melena[  ];  hematochezia [  ]; heartburn[  ];   GU: kidney stones [  ]; hematuria[  ];   dysuria [  ];  nocturia[  ]; incontinence [  ];             Skin: rash, swelling[  ];, hair loss[  ];  peripheral edema[  ];  or itching[  ]; Musculosketetal: myalgias[  ];  joint swelling[  ];  joint erythema[  ];  joint pain[  ];  back pain[  ];  Heme/Lymph: bruising[  ];  bleeding[  ];  anemia[  ];  Neuro: TIA[  ];  headaches[  ];  stroke[  ];  vertigo[  ];  seizures[  ];   paresthesias[  ];  difficulty walking[  ];  Psych:depression[  ]; anxiety[  ];  Endocrine: diabetes[  ];  thyroid dysfunction[  ];  Other:  History reviewed. No pertinent past medical history.  (Not in a hospital admission)    . artificial tears  1 application Both Eyes Q8H  . aspirin  300 mg Rectal NOW  . heparin  5,000 Units Subcutaneous Q8H  . [START ON 03/25/2018] insulin aspart  0-9 Units Subcutaneous Q4H  . pantoprazole (PROTONIX) IV  40 mg Intravenous QHS    Infusions: . sodium chloride    . amiodarone 60 mg/hr (03/24/18 2030)   Followed by  . [START ON 03/25/2018] amiodarone    . ampicillin-sulbactam (UNASYN) IV    . azithromycin    . cisatracurium (NIMBEX) infusion 1 mcg/kg/min (03/24/18 2202)  . fentaNYL infusion INTRAVENOUS 100 mcg/hr (03/24/18 2118)  . midazolam 2 mg/hr (03/24/18 2117)  . norepinephrine (LEVOPHED) Adult infusion 10 mcg/min (03/24/18 2118)  . potassium chloride    . propofol      No Known Allergies  Social History   Socioeconomic History  . Marital status: Married    Spouse name:  Not on file  . Number of children: Not on file  . Years of education: Not on file  . Highest education level: Not on file  Occupational History  . Not on file  Social Needs  . Financial resource strain: Not on file  . Food insecurity:    Worry: Not on file    Inability: Not on file  . Transportation needs:    Medical: Not on file    Non-medical: Not on file  Tobacco Use  . Smoking status: Never Smoker  . Smokeless tobacco: Never Used  Substance and Sexual Activity  . Alcohol use: Not Currently  . Drug use: Not Currently  . Sexual activity: Not on file  Lifestyle  . Physical activity:    Days per week: Not on file    Minutes per session: Not on file  . Stress: Not on file  Relationships  . Social connections:    Talks on phone: Not on file    Gets together: Not on file    Attends religious service: Not on file    Active member of club or organization: Not on file    Attends meetings of clubs or organizations: Not on file    Relationship status: Not on file  . Intimate partner violence:    Fear of current or ex partner: Not on file    Emotionally abused: Not on file    Physically abused: Not on file    Forced sexual activity: Not on file  Other Topics Concern  . Not on file  Social History Narrative  . Not on file    No family history on file.  PHYSICAL EXAM: Vitals:   03/24/18 2145 03/24/18 2200  BP: 114/81 115/84  Pulse: 64 63  Resp: 19 20  Temp:    SpO2: 100% 99%    No intake or output data in the 24 hours ending 03/24/18 2242  General: Intubated, sedated, unresponsive. HEENT: normal  Neck: supple. no JVD. Carotids 2+ bilat; no bruits. No lymphadenopathy or thryomegaly appreciated. Cor: PMI nondisplaced.  Tachycardic.  Regular rhythm. No rubs, gallops or murmurs. Lungs: Mechanical Abdomen: soft, nontender, nondistended. No hepatosplenomegaly. No bruits or masses. Good bowel sounds. Extremities: no cyanosis, clubbing, rash, edema Neuro: Sedated,  unresponsive  ECG: Normal sinus rhythm, 1st degree AV block, Left bundle branch block, minimal repolarization abnormalities, negative for Scarbossa criteria, similar ECG compared to prior from December 2019  Results for orders placed or performed during the hospital encounter of 03/24/18 (from the past 24 hour(s))  Basic metabolic panel     Status: Abnormal   Collection Time: 03/24/18  8:19 PM  Result Value Ref Range   Sodium 138 135 - 145 mmol/L   Potassium 3.2 (L) 3.5 - 5.1 mmol/L   Chloride 107 98 - 111 mmol/L   CO2 19 (L) 22 - 32 mmol/L   Glucose, Bld 206 (H) 70 - 99 mg/dL   BUN 14 8 - 23 mg/dL   Creatinine, Ser 0.23 (H) 0.61 - 1.24 mg/dL   Calcium 8.4 (L) 8.9 - 10.3 mg/dL   GFR calc non Af Amer 49 (L) >60 mL/min   GFR calc Af Amer 57 (L) >60 mL/min   Anion gap 12 5 - 15  CBC     Status: Abnormal   Collection Time: 03/24/18  8:19 PM  Result Value Ref Range   WBC 11.2 (H) 4.0 - 10.5 K/uL   RBC 5.07 4.22 - 5.81 MIL/uL   Hemoglobin 14.3 13.0 - 17.0 g/dL   HCT 34.3 56.8 - 61.6 %   MCV 87.0 80.0 - 100.0 fL   MCH 28.2 26.0 - 34.0 pg   MCHC 32.4 30.0 - 36.0 g/dL   RDW 83.7 29.0 - 21.1 %   Platelets 212 150 - 400 K/uL   nRBC 0.0 0.0 - 0.2 %  APTT     Status: None   Collection Time: 03/24/18  8:19 PM  Result Value Ref Range   aPTT 26 24 - 36 seconds  Protime-INR     Status: Abnormal   Collection Time: 03/24/18  8:19 PM  Result Value Ref Range   Prothrombin Time 15.4 (H) 11.4 - 15.2 seconds   INR 1.2 0.8 - 1.2  Troponin I - Now Then Q6H     Status: Abnormal   Collection Time: 03/24/18  8:58 PM  Result Value Ref Range   Troponin I 0.04 (HH) <0.03 ng/mL  Magnesium     Status: None   Collection Time: 03/24/18  8:58 PM  Result Value Ref Range   Magnesium 2.2 1.7 - 2.4 mg/dL  Phosphorus     Status: None   Collection Time: 03/24/18  8:58 PM  Result Value Ref Range   Phosphorus 3.5 2.5 - 4.6 mg/dL  Hepatic function panel     Status: Abnormal   Collection Time: 03/24/18  8:58  PM  Result Value Ref Range   Total Protein 6.7 6.5 - 8.1 g/dL   Albumin 3.4 (L) 3.5 - 5.0 g/dL   AST 155 (H) 15 - 41 U/L   ALT 232 (H) 0 - 44 U/L   Alkaline Phosphatase 68 38 - 126 U/L   Total Bilirubin 1.1 0.3 - 1.2 mg/dL   Bilirubin, Direct 0.2 0.0 - 0.2 mg/dL   Indirect Bilirubin 0.9 0.3 - 0.9 mg/dL  I-stat troponin, ED     Status: Abnormal   Collection Time: 03/24/18  9:12 PM  Result Value Ref  Range   Troponin i, poc 0.11 (HH) 0.00 - 0.08 ng/mL   Comment NOTIFIED PHYSICIAN    Comment 3          I-STAT 7, (LYTES, BLD GAS, ICA, H+H)     Status: Abnormal   Collection Time: 03/24/18  9:13 PM  Result Value Ref Range   pH, Arterial 7.215 (L) 7.350 - 7.450   pCO2 arterial 49.9 (H) 32.0 - 48.0 mmHg   pO2, Arterial 111.0 (H) 83.0 - 108.0 mmHg   Bicarbonate 20.7 20.0 - 28.0 mmol/L   TCO2 22 22 - 32 mmol/L   O2 Saturation 98.0 %   Acid-base deficit 8.0 (H) 0.0 - 2.0 mmol/L   Sodium 141 135 - 145 mmol/L   Potassium 3.9 3.5 - 5.1 mmol/L   Calcium, Ion 1.17 1.15 - 1.40 mmol/L   HCT 39.0 39.0 - 52.0 %   Hemoglobin 13.3 13.0 - 17.0 g/dL   Patient temperature 37.0 F    Sample type ARTERIAL    Comment NOTIFIED PHYSICIAN   CBC with Differential/Platelet     Status: Abnormal   Collection Time: 03/24/18  9:30 PM  Result Value Ref Range   WBC 10.5 4.0 - 10.5 K/uL   RBC 5.16 4.22 - 5.81 MIL/uL   Hemoglobin 14.1 13.0 - 17.0 g/dL   HCT 96.4 38.3 - 81.8 %   MCV 87.6 80.0 - 100.0 fL   MCH 27.3 26.0 - 34.0 pg   MCHC 31.2 30.0 - 36.0 g/dL   RDW 40.3 75.4 - 36.0 %   Platelets 224 150 - 400 K/uL   nRBC 0.0 0.0 - 0.2 %   Neutrophils Relative % 64 %   Neutro Abs 6.8 1.7 - 7.7 K/uL   Lymphocytes Relative 28 %   Lymphs Abs 2.9 0.7 - 4.0 K/uL   Monocytes Relative 6 %   Monocytes Absolute 0.6 0.1 - 1.0 K/uL   Eosinophils Relative 1 %   Eosinophils Absolute 0.1 0.0 - 0.5 K/uL   Basophils Relative 0 %   Basophils Absolute 0.0 0.0 - 0.1 K/uL   Immature Granulocytes 1 %   Abs Immature  Granulocytes 0.09 (H) 0.00 - 0.07 K/uL   Ct Head Wo Contrast  Result Date: 03/24/2018 CLINICAL DATA:  Cardiac arrest EXAM: CT HEAD WITHOUT CONTRAST TECHNIQUE: Contiguous axial images were obtained from the base of the skull through the vertex without intravenous contrast. COMPARISON:  None. FINDINGS: Brain: No acute territorial infarction, hemorrhage or intracranial mass. The ventricles are of normal size Vascular: No hyperdense vessels.  No unexpected calcification Skull: Normal. Negative for fracture or focal lesion. Sinuses/Orbits: Mucosal thickening in the ethmoid sinuses. Large amount of secretions within the posterior nasopharynx with scattered hyperdense foci, possibly hemorrhage. Other: None IMPRESSION: 1. Negative non contrasted CT appearance of the brain 2. Large amount of secretions at the posterior nasopharynx, possibly with small amount of hemorrhagic secretion. Electronically Signed   By: Jasmine Pang M.D.   On: 03/24/2018 22:39   Dg Chest Port 1 View  Result Date: 03/24/2018 CLINICAL DATA:  Status post central line placement. EXAM: PORTABLE CHEST 1 VIEW COMPARISON:  03/24/2018 at 2008 hours FINDINGS: New left subclavian central venous line has its tip projecting in the lower superior vena cava. Endotracheal tube is stable and well positioned. Hazy lung opacities noted previously are not significantly changed, allowing for differences in patient positioning and technique. No pneumothorax. IMPRESSION: 1. Left subclavian central venous line has its tip in the lower superior vena cava.  No pneumothorax. 2. No other change from the earlier exam. Electronically Signed   By: Amie Portland M.D.   On: 03/24/2018 22:13   Dg Chest Portable 1 View  Result Date: 03/24/2018 CLINICAL DATA:  Status post CPR, check endotracheal tube placement EXAM: PORTABLE CHEST 1 VIEW COMPARISON:  12/31/2017 FINDINGS: Cardiac shadow is mildly prominent but accentuated by the portable technique. Endotracheal tube is noted  approximately 4.5 cm above the carina. Diffuse central vascular congestion is noted as well as increased density throughout the right upper lobe. These changes likely represent mild pulmonary edema. No pneumothorax is seen. No bony abnormality is noted. IMPRESSION: Increased central vascular congestion with some changes suggestive of pulmonary edema particularly in the right upper lobe. Continued follow-up is recommended. Electronically Signed   By: Alcide Clever M.D.   On: 03/24/2018 20:29    ASSESSMENT: In summary, this is a 63 year old male with a known history of left bundle branch block but otherwise no history of cardiovascular disease who presents after cardiac arrest while at home.  To this point, there is no clear etiology of the patient's arrest.  From a cardiovascular standpoint, his ECG appears unchanged from his prior ECG 3 months ago.  It is however difficult to definitively rule out acute coronary syndrome in the setting of an underlying left bundle branch block.  His initial troponin is very slightly elevated and his subsequent troponin will most likely come back even more elevated in the setting of his recent cardiac arrest.  It is difficult to interpret the significance of this trend. The patient does not exhibit any signs of cardiogenic shock either on exam or by his current hemodynamic state.  Of note, a review of the patient's chest x-ray and chest CT scan reveals some evidence of pulmonary edema but also what appears to be an enlarged/dilated left ventricle (although CT scan protocol for PE, not cardiac).  This does make one wonder as to whether he has developed some new cardiomyopathy leading to sudden cardiac death.  The patient's case was discussed with the on-call interventional cardiologist, Dr. Clifton James, who agrees on the decision to defer invasive angiography at this time given lack of clinical evidence of acute coronary syndrome.   PLAN/DISCUSSION: - obtain echocardiogram -  obtain urine drug screen - agree with CT-PE to rule out PE  - obtain repeat troponin 6 hours after initial - agree with empiric heparin drip for presumed acute coronary syndrome - give  aspirin either rectally or per NG tube - agree with TTM post-cardiac arrest - additional workup for other causes of cardiac arrest per critical care team  Rosario Jacks, MD Cardiology Fellow, PGY-6

## 2018-03-24 NOTE — Procedures (Signed)
Arterial Catheter Insertion Procedure Note Kevin Rojas [id number] [birth year]  Procedure: Insertion of Arterial Catheter  Indications: Blood pressure monitoring  Procedure Details Consent: Unable to obtain consent because of altered level of consciousness. Time Out: Verified patient identification, verified procedure, site/side was marked, verified correct patient position, special equipment/implants available, medications/allergies/relevent history reviewed, required imaging and test results available.  Performed  Maximum sterile technique was used including antiseptics, cap, gloves, gown, hand hygiene and mask. Skin prep: Chlorhexidine; local anesthetic administered 20 gauge catheter was inserted into right radial artery using the Seldinger technique. ULTRASOUND GUIDANCE USED: NO Evaluation Blood flow good; BP tracing poor. Complications: No apparent complications.   Benjamine Sprague, B.S, RRT 03/24/2018

## 2018-03-24 NOTE — H&P (Addendum)
NAME:  Shaqwan Hoak, MRN:  [id number], DOB:  Oct 16, 1955, LOS: 0 ADMISSION DATE:  03/24/2018, CONSULTATION DATE:  03/24/2018 REFERRING MD:  Dr. Rosalia Hammers, CHIEF COMPLAINT:  Cardiac arrest  Brief History   63 yoM presenting from home with Vfib cardiac arrest.  CPR started by wife.  Found in Vfib shocked twice and king airway placed.  Vomited around [facility] airway. Intubated and stable in ER but not purposeful.  Hypothermia protocol started.  EKG noted for LBBB.  PCCM to admit  History of present illness   HPI obtained from medical chart review and per discussion with wife, as patient is intubated and sedated.    63 year old non-smoker with history of kidney stones presenting from home s/p cardiac arrest.  Wife reports was in him his normal state of health prior to event.  Only complained of not sleeping well and fatigue, sneezing and coughing in which he attributed to allergies, one report of vague chest discomfort possibly in the last few days.  Wife denies fever, recent sick contacts.  Non smoker, non drinker, and denies illicit drug abuse.  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   She found patient sitting on couch with blank stare and breathing funny.  He was seen normal 10 mins prior to this.  She called 911 and moved him to the floor and started CPR. Unknown time prior to Fire/EMS arrival.  On EMS arrival, found in Vfib defibrillated twice then ROSC.  King airway inserted.  Patient noted to be vomiting around on ER arrival was intubated.  Patient has not had purposeful movement. Initial temp 95.3 and patient has been normo to hypertensive, in SR, EKG showing LBBB. Abnormal labs noted for WBC 11.2, K 3.2, glucose 206, sCr 1.50 (prior sCr 1.31 in 12/2017), trop 0.11, Mag 2.2, CXR post intubation noted for stable ETT, diffuse central vascular congestion and R> left upper lobe.  Cardiology consulted.  Hypothermia protocol initiated.  PCCM to admit.   Past Medical History  Kidney stones   Significant Hospital Events   3/17 Admit  Consults:  Cards   Procedures:  3/17 ETT >> 3/17 OGT >> 3/17 R art line >> 3/17 left subclavian CVL >>  Significant Diagnostic Tests:  3/17 CTH >> 3/17 CTA chest >>  Micro Data:  3/17 BCx 2 >> 3/17 trach asp >> 3/17 RVP >>  Antimicrobials:  3/17 azithro >> 3/17 ceftriaxone x1  3/17 unasyn >>  Interim history/subjective:   Objective   Blood pressure 105/79, pulse 65, temperature (!) 95.3 F (35.2 C), temperature source Rectal, resp. rate (!) 22, height (P) 5\' 10"  (1.778 m), weight 70.3 kg, SpO2 (P) 99 %.    Vent Mode: (P) PRVC FiO2 (%):  [80 %] (P) 80 % Set Rate:  [22 bmp] (P) 22 bmp Vt Set:  [580 mL] (P) 580 mL PEEP:  [8 cmH20] (P) 8 cmH20 Plateau Pressure:  [19 cmH20] (P) 19 cmH20  No intake or output data in the 24 hours ending 03/24/18 2124 Filed Weights   03/24/18 2000  Weight: 70.3 kg   Examination: General:  Adult male on mechanical ventilation in NAD HEENT: MM pink/moist, pupils 4/brisk, anicteric, ETT 7.5 at 23, OGT, noted for emesis in mouth with large chunks of food/ green beans Neuro:  Minimally withdrawals to noxious stimuli, does not localize or f/c CV: rr, no mumur PULM: even/non-labored on MV, breathing over set rate, RUL rales/rhonchi otherwise coarse, no wheeze GI: soft, non distended, hypobs Extremities: cool/dry,  no LE edema  Skin: no rashes  Resolved Hospital Problem list    Assessment & Plan:  Vfib cardiac arrest  - unclear etiology, no known underlying cardiac disease, non prior workup, known abnormal EKG in 12/2017 - ddx ACS/ MI, less likely PE- wells score 0, cardiac arrhythmia, electrolyte abnormality - defib x 2, unclear specific time to ROSC - seen normal 10 mins prior to being found, and unknown time of CPR performed by wife till EMS arrival  P:  Appreciate Cardiology consult Goal K > 4, Mag > 2 - will given KCL x 4 runs now for K 3.2 Defer amiodarone to cards ASA rectal CTH  first, defer possible heparin gtt to cards CTA chest as well Trend troponin / EKG Check LFTs S/p CVL and Aline for TTM protocol TTE in am  Trend CVP   Acute hypoxic respiratory failure Probable aspiration pneumonia  P:  Full MV support, PRVC 8 cc/kg, rate 16 ABG noted, increase rate to 22, recheck ABG in 1 hour  Wean FiO2 / PEEP for goal > 94% VAP protocol See below   Leukocytosis Suspected aspiration pneumonia P:  Send BC and trach asp Add on differential Check UA Assess PCT  Continue azithro, d/c ceftriaxone, start unasyn Check RVP  Acute encephalopathy with concern for anoxic injury given unclear arrest  P:  TTM protocol 63 degrees celsius  PAD RASS goal -4/-5 with paralytic / TOF/ BIS EEG  Pending UDS  CTH pending  AKI P:  S/p 1L NS, NS at 50 Insert foley  BMP q 2 Trend BMP / urinary output Replace electrolytes as indicated Avoid nephrotoxic agents, ensure adequate renal perfusion  Hyperglycemia P:  SSI sensitive CBG q 4 Assess HgA1C  Best practice:  Diet: NPO Pain/Anxiety/Delirium protocol (if indicated): PAD protocol fentanyl/ versed gtt w/ nimbex VAP protocol (if indicated): yes  DVT prophylaxis: SCD/ heparin sq for now GI prophylaxis: PPI Glucose control: SSI sensitive Mobility: BR Code Status: Full  Family Communication: Patients wife and son updated at bedside at length.  63 We discussed TTM protocol and unfortunately will not know if neurological insult till 63rd day.  Disposition: ICU  Labs   CBC: Recent Labs  Lab 03/24/18 2019 03/24/18 2113  WBC 11.2*  --   HGB 14.3 13.3  HCT 44.1 39.0  MCV 87.0  --   PLT 212  --     Basic Metabolic Panel: Recent Labs  Lab 03/24/18 2019 03/24/18 2113  NA 138 141  K 3.2* 3.9  CL 107  --   CO2 19*  --   GLUCOSE 206*  --   BUN 14  --   CREATININE 1.50*  --   CALCIUM 8.4*  --    GFR: Estimated Creatinine Clearance: 50.8 mL/min (A) (by C-G formula based on SCr of 1.5 mg/dL (H)). Recent  Labs  Lab 03/24/18 2019  WBC 11.2*    Liver Function Tests: No results for input(s): AST, ALT, ALKPHOS, BILITOT, PROT, ALBUMIN in the last 168 hours. No results for input(s): LIPASE, AMYLASE in the last 168 hours. No results for input(s): AMMONIA in the last 168 hours.  ABG    Component Value Date/Time   PHART 7.215 (L) 03/24/2018 2113   PCO2ART 49.9 (H) 03/24/2018 2113   PO2ART 111.0 (H) 03/24/2018 2113   HCO3 20.7 03/24/2018 2113   TCO2 22 03/24/2018 2113   ACIDBASEDEF 8.0 (H) 03/24/2018 2113   O2SAT 98.0 03/24/2018 2113     Coagulation Profile: Recent Labs  Lab 03/24/18 2019  INR 1.2    Cardiac Enzymes: No results for input(s): CKTOTAL, CKMB, CKMBINDEX, TROPONINI in the last 168 hours.  HbA1C: No results found for: HGBA1C  CBG: No results for input(s): GLUCAP in the last 168 hours.  Review of Systems:   Unable   Past Medical History  He,  has no past medical history on file.   Kidney stones  Surgical History   History reviewed. No pertinent surgical history.   Social History   reports that he has never smoked. He has never used smokeless tobacco. He reports previous alcohol use. He reports previous drug use.   Family History   His family history is not on file.   Allergies No Known Allergies   Home Medications  Prior to Admission medications   Medication Sig Start Date End Date Taking? Authorizing Provider  acetaminophen (TYLENOL) 500 MG tablet Take 1,000 mg by mouth every 6 (six) hours as needed for mild pain.    [provider]  ELDERBERRY PO Take 1 Applicatorful by mouth daily as needed (cold).    [provider]  HYDROcodone-acetaminophen (NORCO/VICODIN) 5-325 MG tablet Take 1 tablet by mouth every 6 (six) hours as needed for severe pain. 12/31/17   Zadie Rhine, MD  ondansetron (ZOFRAN ODT) 8 MG disintegrating tablet 8mg  ODT q4 hours prn nausea 12/31/17   Zadie Rhine, MD     Critical care time: 60 mins    Posey Boyer, MSN, AGACNP-BC [facility] Pgr: [phone] or if no answer [phone] 03/24/2018, 10:38 PM

## 2018-03-24 NOTE — ED Provider Notes (Signed)
[facility] EMERGENCY DEPARTMENT Provider Note   CSN: [id number] Arrival date & time: 03/24/18  1956    History   Chief Complaint Chief Complaint  Patient presents with  . Cardiac Arrest    HPI Kevin Rojas is a 63 y.o. male.     HPI  63 year old male presents today after cardiac arrest.  History is obtained via EMS.  They report that wife was out of the house returned home to find him unresponsive on the ground.  Fire was first responder.  They placed a ED and patient was shocked prior to EMS arrival.  On EMS arrival patient was in V. fib.  They shocked at 200 J and had a return of spontaneous circulation which then deteriorated to V. tach without pulses.  He received a second 300 J.  King airway was placed.  No other medications were given prior to arrival at the hospital. Wife, and 2 sons, or in family room and I have received additional history from them.  He states that she was on the phone the other room came back to find him sitting in the chair staring to space and unresponsive.  He was breathing.  She put him on the ground and called 911.  She began chest compressions.  She continued compressions until first responders arrived History reviewed. No pertinent past medical history.  There are no active problems to display for this patient.   History reviewed. No pertinent surgical history.      Home Medications    Prior to Admission medications   Medication Sig Start Date End Date Taking? Authorizing Provider  acetaminophen (TYLENOL) 500 MG tablet Take 1,000 mg by mouth every 6 (six) hours as needed for mild pain.    [provider]  ELDERBERRY PO Take 1 Applicatorful by mouth daily as needed (cold).    [provider]  HYDROcodone-acetaminophen (NORCO/VICODIN) 5-325 MG tablet Take 1 tablet by mouth every 6 (six) hours as needed for severe pain. 12/31/17   Zadie Rhine, MD  ondansetron (ZOFRAN ODT) 8 MG disintegrating tablet 8mg   ODT q4 hours prn nausea 12/31/17   Zadie Rhine, MD    Family History No family history on file.  Social History Social History   Tobacco Use  . Smoking status: Never Smoker  . Smokeless tobacco: Never Used  Substance Use Topics  . Alcohol use: Not Currently  . Drug use: Not Currently     Allergies   Patient has no known allergies.   Review of Systems Review of Systems  All other systems reviewed and are negative.    Physical Exam Updated Vital Signs BP 113/71   Pulse 70   Resp (!) 21   Wt 70.3 kg   SpO2 100%   BMI 22.89 kg/m   Physical Exam Vitals signs and nursing note reviewed.  Constitutional:      Comments: Patient unresponsive but is breathing  HENT:     Head: Normocephalic and atraumatic.     Right Ear: External ear normal.     Left Ear: External ear normal.     Nose: Nose normal.     Mouth/Throat:     Comments: Tongue with some abrasions and enlarged Emesis around airway and in mouth with some blood Eyes:     Comments: Pupils are midsize  Cardiovascular:     Rate and Rhythm: Normal rate and regular rhythm.     Pulses: Normal pulses.     Heart sounds: Normal heart sounds.  Pulmonary:     Effort: Respiratory distress present.  Abdominal:     General: There is distension.  Genitourinary:    Penis: Normal.   Musculoskeletal: Normal range of motion.     Comments: Abrasion right arm  Skin:    General: Skin is warm.     Capillary Refill: Capillary refill takes less than 2 seconds.  Neurological:     Comments: Patient has some coughing with leg and appears to move all extremities though no purposeful movement was noted      ED Treatments / Results  Labs (all labs ordered are listed, but only abnormal results are displayed) Labs Reviewed  CBC - Abnormal; Notable for the following components:      Result Value   WBC 11.2 (*)    All other components within normal limits  BLOOD GAS, ARTERIAL  BASIC METABOLIC PANEL  APTT  PROTIME-INR     EKG EKG Interpretation  Date/Time:  Tuesday March 24 2018 20:04:12 EDT Ventricular Rate:  87 PR Interval:    QRS Duration: 165 QT Interval:  456 QTC Calculation: 549 R Axis:   -63 Text Interpretation:  Sinus rhythm Left bundle branch block Confirmed by Margarita Grizzle [phone]) on 03/24/2018 9:28:07 PM  Ekg compared to last and no significant change in QRS morphology Radiology Dg Chest Portable 1 View  Result Date: 03/24/2018 CLINICAL DATA:  Status post CPR, check endotracheal tube placement EXAM: PORTABLE CHEST 1 VIEW COMPARISON:  12/31/2017 FINDINGS: Cardiac shadow is mildly prominent but accentuated by the portable technique. Endotracheal tube is noted approximately 4.5 cm above the carina. Diffuse central vascular congestion is noted as well as increased density throughout the right upper lobe. These changes likely represent mild pulmonary edema. No pneumothorax is seen. No bony abnormality is noted. IMPRESSION: Increased central vascular congestion with some changes suggestive of pulmonary edema particularly in the right upper lobe. Continued follow-up is recommended. Electronically Signed   By: Alcide Clever M.D.   On: 03/24/2018 20:29    Procedures Date/Time: 03/24/2018 8:39 PM Performed by: Margarita Grizzle, MD Pre-anesthesia Checklist: Patient identified, Emergency Drugs available, Suction available, Patient being monitored and Timeout performed Oxygen Delivery Method: Non-rebreather mask Preoxygenation: Pre-oxygenation with 100% oxygen Induction Type: Rapid sequence Ventilation: Mask ventilation without difficulty Laryngoscope Size: Glidescope and 4 Tube size: 8.0 mm Number of attempts: 1 Airway Equipment and Method: Patient positioned with wedge pillow and Video-laryngoscopy Placement Confirmation: ETT inserted through vocal cords under direct vision,  Positive ETCO2,  CO2 detector and Breath sounds checked- equal and bilateral Tube secured with: ETT holder Dental Injury: Teeth  and Oropharynx as per pre-operative assessment     .Critical Care Performed by: Margarita Grizzle, MD Authorized by: Margarita Grizzle, MD   Critical care provider statement:    Critical care time (minutes):  60   Critical care was necessary to treat or prevent imminent or life-threatening deterioration of the following conditions:  Cardiac failure, respiratory failure and circulatory failure   Critical care was time spent personally by me on the following activities:  Development of treatment plan with patient or surrogate, discussions with consultants, examination of patient, gastric intubation, obtaining history from patient or surrogate, ordering and performing treatments and interventions, ordering and review of laboratory studies, ordering and review of radiographic studies, pulse oximetry and re-evaluation of patient's condition   (including critical care time) Post intubation chest x-Jeanmarc Viernes reviewed and ET tube in place but slightly high discussed with RT advancing Medications Ordered in ED Medications  etomidate (  AMIDATE) injection (20 mg Intravenous Given 03/24/18 2002)  succinylcholine (ANECTINE) injection (100 mg Intravenous Given 03/24/18 2002)  amiodarone (NEXTERONE) 1.8 mg/mL load via infusion 150 mg (150 mg Intravenous Bolus from Bag 03/24/18 2030)    Followed by  amiodarone (NEXTERONE PREMIX) 360-4.14 MG/200ML-% (1.8 mg/mL) IV infusion (60 mg/hr Intravenous New Bag/Given 03/24/18 2030)    Followed by  amiodarone (NEXTERONE PREMIX) 360-4.14 MG/200ML-% (1.8 mg/mL) IV infusion (has no administration in time range)  propofol (DIPRIVAN) 1000 MG/100ML infusion (30 mcg/kg/min  70.3 kg Intravenous New Bag/Given 03/24/18 2029)  aspirin suppository 300 mg (has no administration in time range)  fentaNYL (SUBLIMAZE) 100 MCG/2ML injection (has no administration in time range)  cefTRIAXone (ROCEPHIN) 1 g in sodium chloride 0.9 % 100 mL IVPB (has no administration in time range)  azithromycin  (ZITHROMAX) 500 mg in sodium chloride 0.9 % 250 mL IVPB (has no administration in time range)  sodium chloride 0.9 % bolus 1,000 mL (1,000 mLs Intravenous New Bag/Given 03/24/18 2031)     Initial Impression / Assessment and Plan / ED Course  I have reviewed the triage vital signs and the nursing notes.  Pertinent labs & imaging results that were available during my care of the patient were reviewed by me and considered in my medical decision making (see chart for details).        This is a 63 year old man with cardiac arrest at home.  Wife is in next room.  She performed CPR.  He he had shocked by a ED for fire.  EMS found initial rhythm to be V. fib and he was defibrillated.  He had a return of spontaneous circulation but degenerated into ventricular tachycardia and he received a second shock at 300 J.  He then remained in the sinus rhythm.  He has had normotension on my initial evaluation.  Cardiology was consulted.  Discussed with Alinda Money, on-call for cardiology Patient intubated in the ED and amiodarone started. Code cool initiated Patient appears to have aspirated and chest x-Lovell Roe with infiltrates in upper lobes noted Rocephin and Zithromax initiated Critical care consulted and at bedside and have assumed care  Discussed events leading up to arrest, peri-arrest, with patient's wife and 2 sons.   Final Clinical Impressions(s) / ED Diagnoses   Final diagnoses:  Cardiac arrest [facility])    ED Discharge Orders    None       Margarita Grizzle, MD 03/24/18 2133

## 2018-03-24 NOTE — Progress Notes (Signed)
Patient vomiting around ETT.

## 2018-03-24 NOTE — ED Notes (Signed)
Correction: Per wife she exited room and when she came back in pt was unresponsive in chair. She lowered pt to floor and began CPR until fire arrived

## 2018-03-24 NOTE — Progress Notes (Signed)
Patient transported from ED trauma C to CT and and then to 2H25 with no complications.

## 2018-03-24 NOTE — ED Triage Notes (Signed)
BIB EMS from home as Post CPR. Pt wife left house and when she returned pt was found unresponsive on floor. Fire arrived, pt in vfib, shocked total of 2 times. Has remained in NSR en route, HR 80's, BP 156/92,  CBG 150. I/O Bertha Stakes airway present.

## 2018-03-25 ENCOUNTER — Inpatient Hospital Stay (HOSPITAL_COMMUNITY): Payer: BLUE CROSS/BLUE SHIELD

## 2018-03-25 ENCOUNTER — Other Ambulatory Visit: Payer: Self-pay

## 2018-03-25 DIAGNOSIS — I361 Nonrheumatic tricuspid (valve) insufficiency: Secondary | ICD-10-CM

## 2018-03-25 DIAGNOSIS — J9600 Acute respiratory failure, unspecified whether with hypoxia or hypercapnia: Secondary | ICD-10-CM

## 2018-03-25 LAB — BASIC METABOLIC PANEL
ANION GAP: 10 (ref 5–15)
Anion gap: 10 (ref 5–15)
Anion gap: 12 (ref 5–15)
Anion gap: 11 (ref 5–15)
BUN: 14 mg/dL (ref 8–23)
BUN: 13 mg/dL (ref 8–23)
BUN: 13 mg/dL (ref 8–23)
BUN: 13 mg/dL (ref 8–23)
CO2: 16 mmol/L — ABNORMAL LOW (ref 22–32)
CO2: 15 mmol/L — ABNORMAL LOW (ref 22–32)
CO2: 12 mmol/L — ABNORMAL LOW (ref 22–32)
CO2: 11 mmol/L — ABNORMAL LOW (ref 22–32)
Calcium: 8.1 mg/dL — ABNORMAL LOW (ref 8.9–10.3)
Calcium: 7.7 mg/dL — ABNORMAL LOW (ref 8.9–10.3)
Calcium: 7.7 mg/dL — ABNORMAL LOW (ref 8.9–10.3)
Calcium: 8 mg/dL — ABNORMAL LOW (ref 8.9–10.3)
Chloride: 114 mmol/L — ABNORMAL HIGH (ref 98–111)
Chloride: 116 mmol/L — ABNORMAL HIGH (ref 98–111)
Chloride: 117 mmol/L — ABNORMAL HIGH (ref 98–111)
Chloride: 119 mmol/L — ABNORMAL HIGH (ref 98–111)
Creatinine, Ser: 1.27 mg/dL — ABNORMAL HIGH (ref 0.61–1.24)
Creatinine, Ser: 1.5 mg/dL — ABNORMAL HIGH (ref 0.61–1.24)
Creatinine, Ser: 1.51 mg/dL — ABNORMAL HIGH (ref 0.61–1.24)
Creatinine, Ser: 1.45 mg/dL — ABNORMAL HIGH (ref 0.61–1.24)
GFR calc Af Amer: >60 mL/min (ref >60)
GFR calc Af Amer: 57 mL/min — ABNORMAL LOW (ref >60)
GFR calc Af Amer: 59 mL/min — ABNORMAL LOW (ref >60)
GFR calc non Af Amer: 49 mL/min — ABNORMAL LOW (ref >60)
GFR calc non Af Amer: 49 mL/min — ABNORMAL LOW (ref >60)
GFR calc non Af Amer: 51 mL/min — ABNORMAL LOW (ref >60)
GFR, EST AFRICAN AMERICAN: 57 mL/min — AB (ref >60)
GLUCOSE: 182 mg/dL — AB (ref 70–99)
Glucose, Bld: 169 mg/dL — ABNORMAL HIGH (ref 70–99)
Glucose, Bld: 154 mg/dL — ABNORMAL HIGH (ref 70–99)
Glucose, Bld: 164 mg/dL — ABNORMAL HIGH (ref 70–99)
Potassium: 3.7 mmol/L (ref 3.5–5.1)
Potassium: 2.6 mmol/L — CL (ref 3.5–5.1)
Potassium: 3.3 mmol/L — ABNORMAL LOW (ref 3.5–5.1)
Potassium: 4.3 mmol/L (ref 3.5–5.1)
Sodium: 140 mmol/L (ref 135–145)
Sodium: 141 mmol/L (ref 135–145)
Sodium: 141 mmol/L (ref 135–145)
Sodium: 141 mmol/L (ref 135–145)

## 2018-03-25 LAB — GLUCOSE, CAPILLARY
GLUCOSE-CAPILLARY: 120 mg/dL — AB (ref 70–99)
Glucose-Capillary: 108 mg/dL — ABNORMAL HIGH (ref 70–99)
Glucose-Capillary: 120 mg/dL — ABNORMAL HIGH (ref 70–99)
Glucose-Capillary: 144 mg/dL — ABNORMAL HIGH (ref 70–99)
Glucose-Capillary: 148 mg/dL — ABNORMAL HIGH (ref 70–99)
Glucose-Capillary: 154 mg/dL — ABNORMAL HIGH (ref 70–99)
Glucose-Capillary: 158 mg/dL — ABNORMAL HIGH (ref 70–99)
Glucose-Capillary: 160 mg/dL — ABNORMAL HIGH (ref 70–99)
Glucose-Capillary: 170 mg/dL — ABNORMAL HIGH (ref 70–99)
Glucose-Capillary: 219 mg/dL — ABNORMAL HIGH (ref 70–99)
Glucose-Capillary: 256 mg/dL — ABNORMAL HIGH (ref 70–99)
Glucose-Capillary: 91 mg/dL (ref 70–99)

## 2018-03-25 LAB — CBC
HCT: 41.7 % (ref 39.0–52.0)
Hemoglobin: 13.4 g/dL (ref 13.0–17.0)
MCH: 27.3 pg (ref 26.0–34.0)
MCHC: 32.1 g/dL (ref 30.0–36.0)
MCV: 85.1 fL (ref 80.0–100.0)
PLATELETS: 188 10*3/uL (ref 150–400)
RBC: 4.9 MIL/uL (ref 4.22–5.81)
RDW: 14.1 % (ref 11.5–15.5)
WBC: 12.8 10*3/uL — ABNORMAL HIGH (ref 4.0–10.5)
nRBC: 0 % (ref 0.0–0.2)

## 2018-03-25 LAB — RESPIRATORY PANEL BY PCR
Adenovirus: NOT DETECTED
Bordetella pertussis: NOT DETECTED
Chlamydophila pneumoniae: NOT DETECTED
Coronavirus 229E: NOT DETECTED
Coronavirus HKU1: NOT DETECTED
Coronavirus NL63: DETECTED — AB
Coronavirus OC43: NOT DETECTED
Influenza A: NOT DETECTED
Influenza B: NOT DETECTED
METAPNEUMOVIRUS-RVPPCR: NOT DETECTED
Mycoplasma pneumoniae: NOT DETECTED
PARAINFLUENZA VIRUS 3-RVPPCR: NOT DETECTED
Parainfluenza Virus 1: NOT DETECTED
Parainfluenza Virus 2: NOT DETECTED
Parainfluenza Virus 4: NOT DETECTED
Respiratory Syncytial Virus: NOT DETECTED
Rhinovirus / Enterovirus: NOT DETECTED

## 2018-03-25 LAB — POCT I-STAT 7, (LYTES, BLD GAS, ICA,H+H)
Acid-base deficit: 9 mmol/L — ABNORMAL HIGH (ref 0.0–2.0)
BICARBONATE: 15.2 mmol/L — AB (ref 20.0–28.0)
Calcium, Ion: 1.18 mmol/L (ref 1.15–1.40)
HCT: 40 % (ref 39.0–52.0)
Hemoglobin: 13.6 g/dL (ref 13.0–17.0)
O2 Saturation: 98 %
Potassium: 3.7 mmol/L (ref 3.5–5.1)
SODIUM: 142 mmol/L (ref 135–145)
TCO2: 16 mmol/L — ABNORMAL LOW (ref 22–32)
pCO2 arterial: 24.1 mmHg — ABNORMAL LOW (ref 32.0–48.0)
pH, Arterial: 7.387 (ref 7.350–7.450)
pO2, Arterial: 89 mmHg (ref 83.0–108.0)

## 2018-03-25 LAB — ECHOCARDIOGRAM COMPLETE
Height: 70 in
Weight: 2543.23 oz

## 2018-03-25 LAB — HEPARIN LEVEL (UNFRACTIONATED)
HEPARIN UNFRACTIONATED: 0.52 [IU]/mL (ref 0.30–0.70)
Heparin Unfractionated: 0.59 IU/mL (ref 0.30–0.70)

## 2018-03-25 LAB — URINALYSIS, ROUTINE W REFLEX MICROSCOPIC
Bilirubin Urine: NEGATIVE
Glucose, UA: 50 mg/dL — AB
KETONES UR: NEGATIVE mg/dL
LEUKOCYTE UA: NEGATIVE
Nitrite: NEGATIVE
Protein, ur: NEGATIVE mg/dL
Specific Gravity, Urine: 1.011 (ref 1.005–1.030)
pH: 7 (ref 5.0–8.0)

## 2018-03-25 LAB — RAPID URINE DRUG SCREEN, HOSP PERFORMED
Amphetamines: NOT DETECTED
Barbiturates: NOT DETECTED
Benzodiazepines: POSITIVE — AB
Cocaine: NOT DETECTED
Opiates: NOT DETECTED
Tetrahydrocannabinol: NOT DETECTED

## 2018-03-25 LAB — MAGNESIUM
Magnesium: 1.9 mg/dL (ref 1.7–2.4)
Magnesium: 1.8 mg/dL (ref 1.7–2.4)

## 2018-03-25 LAB — LACTIC ACID, PLASMA
LACTIC ACID, VENOUS: 1.5 mmol/L (ref 0.5–1.9)
Lactic Acid, Venous: 5.1 mmol/L (ref 0.5–1.9)

## 2018-03-25 LAB — TROPONIN I
TROPONIN I: 4.8 ng/mL — AB (ref <0.03)
Troponin I: 2.13 ng/mL (ref <0.03)
Troponin I: 2.35 ng/mL (ref <0.03)

## 2018-03-25 LAB — PHOSPHORUS: Phosphorus: 1.2 mg/dL — ABNORMAL LOW (ref 2.5–4.6)

## 2018-03-25 LAB — PROTIME-INR
INR: 1.3 — AB (ref 0.8–1.2)
INR: 1.3 — ABNORMAL HIGH (ref 0.8–1.2)
Prothrombin Time: 16.4 seconds — ABNORMAL HIGH (ref 11.4–15.2)
Prothrombin Time: 16.4 seconds — ABNORMAL HIGH (ref 11.4–15.2)

## 2018-03-25 LAB — APTT
aPTT: 31 seconds (ref 24–36)
aPTT: 190 seconds (ref 24–36)
aPTT: 153 seconds — ABNORMAL HIGH (ref 24–36)

## 2018-03-25 LAB — PROCALCITONIN: Procalcitonin: <0.1 ng/mL

## 2018-03-25 LAB — TSH: TSH: 1.863 u[IU]/mL (ref 0.350–4.500)

## 2018-03-25 LAB — MRSA PCR SCREENING: MRSA by PCR: NEGATIVE

## 2018-03-25 MED ORDER — INSULIN ASPART 100 UNIT/ML ~~LOC~~ SOLN
0.0000 [IU] | SUBCUTANEOUS | Status: DC
  Administered 2018-03-25 (×2): 2 [IU] via SUBCUTANEOUS
  Administered 2018-03-25 – 2018-03-29 (×3): 1 [IU] via SUBCUTANEOUS

## 2018-03-25 MED ORDER — ALBUTEROL SULFATE (2.5 MG/3ML) 0.083% IN NEBU: 2.5000 mg | INHALATION_SOLUTION | RESPIRATORY_TRACT | Status: DC

## 2018-03-25 MED ORDER — HEPARIN BOLUS VIA INFUSION
2000.0000 [IU] | Freq: Once | INTRAVENOUS | Status: AC
  Administered 2018-03-25: 2000 [IU] via INTRAVENOUS
  Filled 2018-03-25: qty 2000

## 2018-03-25 MED ORDER — SODIUM CHLORIDE 0.9% FLUSH
10.0000 mL | Freq: Two times a day (BID) | INTRAVENOUS | Status: DC
  Administered 2018-03-25 – 2018-03-28 (×7): 10 mL
  Administered 2018-03-29: 30 mL

## 2018-03-25 MED ORDER — INSULIN REGULAR(HUMAN) IN NACL 100-0.9 UT/100ML-% IV SOLN
INTRAVENOUS | Status: DC
  Administered 2018-03-25: 1.6 [IU]/h via INTRAVENOUS
  Filled 2018-03-25: qty 100

## 2018-03-25 MED ORDER — ALBUTEROL SULFATE (2.5 MG/3ML) 0.083% IN NEBU: 2.5000 mg | INHALATION_SOLUTION | Freq: Four times a day (QID) | RESPIRATORY_TRACT | Status: DC

## 2018-03-25 MED ORDER — VITAL AF 1.2 CAL PO LIQD
1000.0000 mL | ORAL | Status: DC
  Administered 2018-03-26: 1000 mL

## 2018-03-25 MED ORDER — HEPARIN (PORCINE) 25000 UT/250ML-% IV SOLN
800.0000 [IU]/h | INTRAVENOUS | Status: DC
  Administered 2018-03-25 (×2): 600 [IU]/h via INTRAVENOUS
  Administered 2018-03-26: 700 [IU]/h via INTRAVENOUS
  Filled 2018-03-25 (×2): qty 250

## 2018-03-25 MED ORDER — MAGNESIUM SULFATE IN D5W 1-5 GM/100ML-% IV SOLN
1.0000 g | Freq: Once | INTRAVENOUS | Status: AC
  Administered 2018-03-25: 1 g via INTRAVENOUS
  Filled 2018-03-25: qty 100

## 2018-03-25 MED ORDER — ALBUTEROL SULFATE (2.5 MG/3ML) 0.083% IN NEBU
2.5000 mg | INHALATION_SOLUTION | RESPIRATORY_TRACT | Status: AC
  Administered 2018-03-25: 2.5 mg via RESPIRATORY_TRACT
  Filled 2018-03-25: qty 3

## 2018-03-25 MED ORDER — ORAL CARE MOUTH RINSE
15.0000 mL | OROMUCOSAL | Status: DC
  Administered 2018-03-25 – 2018-03-28 (×33): 15 mL via OROMUCOSAL

## 2018-03-25 MED ORDER — SODIUM CHLORIDE 0.9% FLUSH: 10.0000 mL | INTRAVENOUS | Status: DC | PRN

## 2018-03-25 MED ORDER — ALBUTEROL SULFATE (2.5 MG/3ML) 0.083% IN NEBU
2.5000 mg | INHALATION_SOLUTION | Freq: Four times a day (QID) | RESPIRATORY_TRACT | Status: DC
  Administered 2018-03-25 – 2018-03-28 (×13): 2.5 mg via RESPIRATORY_TRACT
  Filled 2018-03-25 (×13): qty 3

## 2018-03-25 MED ORDER — POTASSIUM CHLORIDE 20 MEQ/15ML (10%) PO SOLN: 40.0000 meq | Freq: Once | ORAL | Status: DC

## 2018-03-25 MED ORDER — VITAL HIGH PROTEIN PO LIQD: 1000.0000 mL | ORAL | Status: DC

## 2018-03-25 MED ORDER — SODIUM CHLORIDE 0.9 % IV SOLN
3.0000 g | Freq: Four times a day (QID) | INTRAVENOUS | Status: AC
  Administered 2018-03-25 – 2018-03-28 (×15): 3 g via INTRAVENOUS
  Filled 2018-03-25 (×16): qty 3

## 2018-03-25 MED ORDER — CHLORHEXIDINE GLUCONATE CLOTH 2 % EX PADS
6.0000 | MEDICATED_PAD | Freq: Every day | CUTANEOUS | Status: DC
  Administered 2018-03-25 – 2018-03-27 (×4): 6 via TOPICAL

## 2018-03-25 MED ORDER — CHLORHEXIDINE GLUCONATE 0.12% ORAL RINSE (MEDLINE KIT)
15.0000 mL | Freq: Two times a day (BID) | OROMUCOSAL | Status: DC
  Administered 2018-03-25 – 2018-03-28 (×7): 15 mL via OROMUCOSAL

## 2018-03-25 MED ORDER — POTASSIUM CHLORIDE 20 MEQ/15ML (10%) PO SOLN
40.0000 meq | Freq: Once | ORAL | Status: AC
  Administered 2018-03-25: 40 meq
  Filled 2018-03-25: qty 30

## 2018-03-25 NOTE — Progress Notes (Signed)
EEG completed, results pending. 

## 2018-03-25 NOTE — Progress Notes (Signed)
  Echocardiogram 2D Echocardiogram has been performed.  Kevin Rojas 03/25/2018, 8:50 AM

## 2018-03-25 NOTE — Plan of Care (Signed)
Patient arrived via bed to unit, initiated CODE COOL in Ed, placed Arctic sun pads and began cooling per protocol.  Ice packs were removed.  Family educated on visitation restrictions, verbalizing understanding.  Patient was placed on froplet precautions per protocol until respiratory panel results.  Monitoring as per protocol.

## 2018-03-25 NOTE — Progress Notes (Signed)
Progress Note  Patient Name: Kevin Rojas Date of Encounter: 03/25/2018  Primary Cardiologist: No primary care provider on file.   Subjective   No history available, patient intubated, sedated, and paralyzed.  Inpatient Medications    Scheduled Meds: . albuterol  2.5 mg Nebulization Q6H  . artificial tears  1 application Both Eyes H0Q  . chlorhexidine gluconate (MEDLINE KIT)  15 mL Mouth Rinse BID  . Chlorhexidine Gluconate Cloth  6 each Topical Daily  . mouth rinse  15 mL Mouth Rinse 10 times per day  . pantoprazole (PROTONIX) IV  40 mg Intravenous QHS  . sodium chloride flush  10-40 mL Intracatheter Q12H   Continuous Infusions: . sodium chloride 50 mL/hr at 03/25/18 0700  . amiodarone Stopped (03/25/18 0602)  . ampicillin-sulbactam (UNASYN) IV 3 g (03/25/18 0747)  . azithromycin Stopped (03/25/18 0200)  . cisatracurium (NIMBEX) infusion 1 mcg/kg/min (03/25/18 0700)  . fentaNYL infusion INTRAVENOUS 200 mcg/hr (03/25/18 0842)  . heparin 600 Units/hr (03/25/18 0700)  . insulin Stopped (03/25/18 0848)  . midazolam 8 mg/hr (03/25/18 0917)  . norepinephrine (LEVOPHED) Adult infusion 8 mcg/min (03/25/18 0858)  . propofol     PRN Meds: [COMPLETED] cisatracurium **AND** cisatracurium (NIMBEX) infusion **AND** cisatracurium, fentaNYL, midazolam, sodium chloride flush   Vital Signs    Vitals:   03/25/18 0500 03/25/18 0637 03/25/18 0700 03/25/18 0746  BP: 97/61  122/64 122/64  Pulse: (!) 42  (!) 41 (!) 46  Resp: (!) 22  (!) 22 (!) 22  Temp:   (!) 90.1 F (32.3 C) (!) 90.3 F (32.4 C)  TempSrc:   Bladder Bladder  SpO2: 100% 100% 99% 100%  Weight:      Height:        Intake/Output Summary (Last 24 hours) at 03/25/2018 0930 Last data filed at 03/25/2018 0800 Gross per 24 hour  Intake 1595.83 ml  Output 2000 ml  Net -404.17 ml   Last 3 Weights 03/24/2018 03/24/2018 12/31/2017  Weight (lbs) 158 lb 15.2 oz 154 lb 15.7 oz 155 lb  Weight (kg) 72.1 kg 70.3 kg 70.308 kg     Telemetry    Marked sinus bradycardia, no VT or VF- Personally Reviewed  ECG    Marked sinus bradycardia, left bundle branch block, prolonged QT - Personally Reviewed  Physical Exam  Intubated/sedated GEN: No acute distress.   Neck: No JVD Cardiac: RRR, no murmurs, rubs, or gallops.  Respiratory: Clear to auscultation bilaterally. GI: Soft, no masses, non-distended  MS: No edema; No deformity. Neuro:  Nonfocal  Psych: Normal affect   Labs    Chemistry Recent Labs  Lab 03/24/18 2019 03/24/18 2058  03/24/18 2310 03/25/18 0557 03/25/18 0744  NA 138  --    < > 140 142 140  K 3.2*  --    < > 3.9 3.7 3.7  CL 107  --   --   --   --  114*  CO2 19*  --   --   --   --  16*  GLUCOSE 206*  --   --   --   --  169*  BUN 14  --   --   --   --  14  CREATININE 1.50*  --   --   --   --  1.27*  CALCIUM 8.4*  --   --   --   --  8.1*  PROT  --  6.7  --   --   --   --  ALBUMIN  --  3.4*  --   --   --   --   AST  --  250*  --   --   --   --   ALT  --  232*  --   --   --   --   ALKPHOS  --  68  --   --   --   --   BILITOT  --  1.1  --   --   --   --   GFRNONAA 49*  --   --   --   --  >60  GFRAA 57*  --   --   --   --  >60  ANIONGAP 12  --   --   --   --  10   < > = values in this interval not displayed.     Hematology Recent Labs  Lab 03/24/18 2019  03/24/18 2130 03/24/18 2310 03/25/18 0557 03/25/18 0744  WBC 11.2*  --  10.5  --   --  12.8*  RBC 5.07  --  5.16  --   --  4.90  HGB 14.3   < > 14.1 13.9 13.6 13.4  HCT 44.1   < > 45.2 41.0 40.0 41.7  MCV 87.0  --  87.6  --   --  85.1  MCH 28.2  --  27.3  --   --  27.3  MCHC 32.4  --  31.2  --   --  32.1  RDW 14.1  --  14.0  --   --  14.1  PLT 212  --  224  --   --  188   < > = values in this interval not displayed.    Cardiac Enzymes Recent Labs  Lab 03/24/18 2058 03/25/18 0048 03/25/18 0744  TROPONINI 0.04* 2.13* 4.80*    Recent Labs  Lab 03/24/18 2112  TROPIPOC 0.11*     BNPNo results for input(s): BNP,  PROBNP in the last 168 hours.   DDimer No results for input(s): DDIMER in the last 168 hours.   Radiology    Ct Head Wo Contrast  Result Date: 03/24/2018 CLINICAL DATA:  Cardiac arrest EXAM: CT HEAD WITHOUT CONTRAST TECHNIQUE: Contiguous axial images were obtained from the base of the skull through the vertex without intravenous contrast. COMPARISON:  None. FINDINGS: Brain: No acute territorial infarction, hemorrhage or intracranial mass. The ventricles are of normal size Vascular: No hyperdense vessels.  No unexpected calcification Skull: Normal. Negative for fracture or focal lesion. Sinuses/Orbits: Mucosal thickening in the ethmoid sinuses. Large amount of secretions within the posterior nasopharynx with scattered hyperdense foci, possibly hemorrhage. Other: None IMPRESSION: 1. Negative non contrasted CT appearance of the brain 2. Large amount of secretions at the posterior nasopharynx, possibly with small amount of hemorrhagic secretion. Electronically Signed   By: Donavan Foil M.D.   On: 03/24/2018 22:39   Ct Angio Chest Pe W Or Wo Contrast  Result Date: 03/24/2018 CLINICAL DATA:  Cardiac arrest.  Intubated patient. EXAM: CT ANGIOGRAPHY CHEST WITH CONTRAST TECHNIQUE: Multidetector CT imaging of the chest was performed using the standard protocol during bolus administration of intravenous contrast. Multiplanar CT image reconstructions and MIPs were obtained to evaluate the vascular anatomy. CONTRAST:  58m OMNIPAQUE IOHEXOL 350 MG/ML SOLN COMPARISON:  Current chest radiographs. FINDINGS: Cardiovascular: There is satisfactory opacification of the pulmonary arteries to the segmental level. There is no evidence of a pulmonary embolism. Heart is mildly enlarged. No  pericardial effusion. No coronary artery calcifications. Ascending aorta is dilated to 4.4 cm. Aorta is not opacified. No atherosclerotic calcifications. Mediastinum/Nodes: No neck base, no neck base or axillary masses or enlarged lymph  nodes. Endotracheal tube and nasal/orogastric tube are well positioned. Subcentimeter shotty mediastinal lymph nodes. No discrete enlarged mediastinal or hilar lymph nodes. No masses. Trachea is unremarkable. Lungs/Pleura: Bilateral dependent lung consolidation. Airspace consolidation is noted in the posterior right upper lobe with a lesser degree of posterior consolidation in the right lower lobe. There is less extensive consolidation in the posterior left upper lobe and left lower lobe. No evidence of pulmonary edema. No pleural effusion and no pneumothorax. Upper Abdomen: No acute abnormality. Musculoskeletal: No chest wall abnormality. No acute or significant osseous findings. Review of the MIP images confirms the above findings. IMPRESSION: 1. No evidence of a pulmonary embolus. 2. Dependent consolidation in both lungs, right greater than left. A significant portion of this is likely due to multifocal pneumonia. Some of this dependent opacity, particularly in the lower lobes, is likely atelectasis. 3. There is no evidence of pulmonary edema.  No pleural effusion. 4. Endotracheal and nasogastric tubes are well position. 5. Mild cardiomegaly. 6. Dilated ascending aorta to 4.2 cm. Recommend annual imaging followup by CTA or MRA. This recommendation follows 2010 ACCF/AHA/AATS/ACR/ASA/SCA/SCAI/SIR/STS/SVM Guidelines for the Diagnosis and Management of Patients with Thoracic Aortic Disease. Circulation. 2010; 121: Q657-Q469. Aortic aneurysm NOS (ICD10-I71.9) Aortic aneurysm NOS (ICD10-I71.9). Electronically Signed   By: Lajean Manes M.D.   On: 03/24/2018 22:44   Dg Chest Port 1 View  Result Date: 03/25/2018 CLINICAL DATA:  Endotracheal intubation EXAM: PORTABLE CHEST 1 VIEW COMPARISON:  Yesterday FINDINGS: Endotracheal tube tip just below the clavicular heads. The orogastric tube at least reaches the stomach. Left subclavian line with tip at the SVC. Extensive lung opacity greater on the right correlating with  atelectasis and consolidation by CT. There has been mild progression since yesterday. No visible pneumothorax. IMPRESSION: 1. Unremarkable hardware positioning. 2. History of aspiration pneumonia with mildly increased opacity. Electronically Signed   By: Monte Fantasia M.D.   On: 03/25/2018 06:47   Dg Chest Port 1 View  Result Date: 03/24/2018 CLINICAL DATA:  Status post central line placement. EXAM: PORTABLE CHEST 1 VIEW COMPARISON:  03/24/2018 at 2008 hours FINDINGS: New left subclavian central venous line has its tip projecting in the lower superior vena cava. Endotracheal tube is stable and well positioned. Hazy lung opacities noted previously are not significantly changed, allowing for differences in patient positioning and technique. No pneumothorax. IMPRESSION: 1. Left subclavian central venous line has its tip in the lower superior vena cava. No pneumothorax. 2. No other change from the earlier exam. Electronically Signed   By: Lajean Manes M.D.   On: 03/24/2018 22:13   Dg Chest Portable 1 View  Result Date: 03/24/2018 CLINICAL DATA:  Status post CPR, check endotracheal tube placement EXAM: PORTABLE CHEST 1 VIEW COMPARISON:  12/31/2017 FINDINGS: Cardiac shadow is mildly prominent but accentuated by the portable technique. Endotracheal tube is noted approximately 4.5 cm above the carina. Diffuse central vascular congestion is noted as well as increased density throughout the right upper lobe. These changes likely represent mild pulmonary edema. No pneumothorax is seen. No bony abnormality is noted. IMPRESSION: Increased central vascular congestion with some changes suggestive of pulmonary edema particularly in the right upper lobe. Continued follow-up is recommended. Electronically Signed   By: Inez Catalina M.D.   On: 03/24/2018 20:29    Cardiac  Studies   2D echocardiogram formal interpretation pending.  Patient Profile     63 y.o. male with chronic left bundle branch block, suffered out of  hospital ventricular fibrillation cardiac arrest  Assessment & Plan    1.  Ventricular fibrillation arrest: The patient received multiple shocks in the field by EMS after receiving CPR from his wife.  His rhythm has stabilized.  Etiology/cause of arrest remains unclear.  Differential diagnosis includes ischemic heart disease, electrolyte imbalance, and cardiomyopathy is most likely culprits.  Apparently did not have any prodromal symptoms.  2.  Marked bradycardia: The patient was initially treated with amiodarone and this is been discontinued because of marked bradycardia and QT prolongation.  I suspect these changes are also caused by therapeutic hypothermia.  At the time of my evaluation his heart rate is now about 45 bpm and he appears hemodynamically stable.  We will continue to observe.  If he has progressive bradycardia arrhythmias, may need to alter his treatment approach to normothermia.  3.  Cardiomyopathy/aortic insufficiency: I have reviewed his echo images at the bedside.  He appears to have significant aortic insufficiency at least moderate in severity.  He also appears to have a mild global cardiomyopathy but I do not appreciate any regional wall motion abnormalities that would suggest ischemic heart disease.  It is possible that he has underlying aortic insufficiency and cardiomyopathy which could have led to his cardiac arrest.  Will await for formal echo interpretation.  4.  Non-STEMI: Troponin is elevated, level of troponin elevation could clearly be consistent with cardiac arrest scenario.  Difficult to know regarding possibility of ACS versus demand ischemia at this point.  Patient will need cardiac catheterization pending his neurologic recovery and clinical course.  For now he is treated with IV heparin.  The patient is critically ill with multiple organ systems failure and requires high complexity decision making for assessment and support, frequent evaluation and titration of  therapies, application of advanced monitoring technologies and extensive interpretation of multiple databases.   Critical Care Time devoted to patient care services described in this note is 40 minutes  We will follow with you.   For questions or updates, please contact [place] Please consult [email or web address] for contact info under     Signed, Sherren Mocha, MD  03/25/2018, 9:30 AM

## 2018-03-25 NOTE — Progress Notes (Signed)
ANTICOAGULATION CONSULT NOTE   Pharmacy Consult for Heparin Indication: chest pain/ACS  No Known Allergies  Patient Measurements: Height: 5' 10" (177.8 cm) Weight: 158 lb 15.2 oz (72.1 kg) IBW/kg (Calculated) : 73  Vital Signs: Temp: 91.6 F (33.1 C) (03/18 2100) Temp Source: Bladder (03/18 2100) BP: 130/63 (03/18 2046) Pulse Rate: 43 (03/18 2100)  Labs: Recent Labs    03/24/18 2019  03/24/18 2130 03/24/18 2310 03/25/18 0048 03/25/18 0252 03/25/18 0557 03/25/18 0744 03/25/18 1100 03/25/18 1125 03/25/18 1334 03/25/18 1700 03/25/18 2116  HGB 14.3   < > 14.1 13.9  --   --  13.6 13.4  --   --   --   --   --   HCT 44.1   < > 45.2 41.0  --   --  40.0 41.7  --   --   --   --   --   PLT 212  --  224  --   --   --   --  188  --   --   --   --   --   APTT 26  --   --   --   --  31  --   --  190*  --  153*  --   --   LABPROT 15.4*  --   --   --   --  16.4*  --   --  16.4*  --   --   --   --   INR 1.2  --   --   --   --  1.3*  --   --  1.3*  --   --   --   --   HEPARINUNFRC  --   --   --   --   --   --   --   --   --  0.52  --   --  0.59  CREATININE 1.50*  --   --   --   --   --   --  1.27* 1.50*  --   --  1.51*  --   TROPONINI  --    < >  --   --  2.13*  --   --  4.80*  --   --  2.35*  --   --    < > = values in this interval not displayed.    Estimated Creatinine Clearance: 51.7 mL/min (A) (by C-G formula based on SCr of 1.51 mg/dL (H)).   Medical History: History reviewed. No pertinent past medical history.  Medications:  Scheduled:  . albuterol  2.5 mg Nebulization Q6H  . artificial tears  1 application Both Eyes V6H  . chlorhexidine gluconate (MEDLINE KIT)  15 mL Mouth Rinse BID  . Chlorhexidine Gluconate Cloth  6 each Topical Daily  . feeding supplement (VITAL AF 1.2 CAL)  1,000 mL Per Tube Q24H  . insulin aspart  0-9 Units Subcutaneous Q4H  . mouth rinse  15 mL Mouth Rinse 10 times per day  . pantoprazole (PROTONIX) IV  40 mg Intravenous QHS  . sodium chloride  flush  10-40 mL Intracatheter Q12H    Assessment: 63 y.o. M presents s/p cardiac arrest. Pt with increased troponin to 2.13. To begin heparin for r/o ACS. No AC PTA. CBC ok at baseline. Pt currently on hypothermia protocol with target temperature 33 degrees.  Follow up heparin level came back therapeutic at 0.5, on 600 units/hr. CBC remains stable. No s/sx of  bleed. No infusion issues.   Goal of Therapy:  Heparin level 0.3-0.7 units/ml Monitor platelets by anticoagulation protocol: Yes   Plan:  Continue heparin gtt at 600 units/hr Daily heparin level and CBC  Erin Hearing PharmD., BCPS Clinical Pharmacist 03/25/2018 10:01 PM

## 2018-03-25 NOTE — Progress Notes (Signed)
PCCM Interval Note   Worsening bradycardia now from 40/ 50's into the low 30's w/ TTM but noted that QTc is flipping QRS waves on monitor.   Amio gtt turned off at 0609.  Remains on levophed 6 mcg/min.  Patient remains on heparin gtt.  Troponin noted 0.04- 2.13.    EKG obtained- with changes from prior, showing sinus brady with prolongation of PR / mobitz 2 type 1 without dropping of QRS complex, RRB and inferior and lateral changes.    P:  Cardiology called for EKG review, spoke with Hammonds, NP   Posey Boyer, MSN, AGACNP-BC [facility] Pgr: [phone] or if no answer [phone] 03/25/2018, 6:41 AM

## 2018-03-25 NOTE — Progress Notes (Signed)
NAME:  Kevin Rojas, MRN:  [id number], DOB:  04/26/1955, LOS: 1 ADMISSION DATE:  03/24/2018, CONSULTATION DATE:  03/24/2018 REFERRING MD:  Dr. Rosalia Hammers, CHIEF COMPLAINT:  Cardiac arrest  Brief History   69 yoM presenting from home with Vfib cardiac arrest.  CPR started by wife.  Found in Vfib shocked twice and king airway placed.  Vomited around [facility] airway. Intubated and stable in ER but not purposeful.  Hypothermia protocol started.  EKG noted for LBBB.  PCCM to admit  Past Medical History  Kidney stones  Significant Hospital Events   3/17 Admit post VF arrest   Consults:  Cards   Procedures:  3/17 ETT >> 3/17 OGT >> 3/17 R art line >> 3/17 left subclavian CVL >>  Significant Diagnostic Tests:  3/17 [facility] >> negative  3/17 CTA chest >> neg for PE, dependent consolidation in both lungs R>L, multifocal PNA, mild cardiomegaly, dilated ascending aorta to 4.2 cm   Micro Data:  3/17 BCx 2 >> 3/17 trach asp >> 3/17 RVP >> coronavirus NL63 positive (NOT COVID)  Antimicrobials:  Ceftriaxone 3/17 x1  Azithro 3/17 >> 3/18 Unasyn 3/17 >>  Interim history/subjective:  Hypothermia protocol continues.  Amiodarone discontinued due to bradycardia.  Objective   Blood pressure 100/62, pulse (!) 48, temperature (!) 90.3 F (32.4 C), temperature source Bladder, resp. rate (!) 22, height 5\' 10"  (1.778 m), weight 72.1 kg, SpO2 99 %. CVP:  [7 mmHg-12 mmHg] 9 mmHg  Vent Mode: PRVC FiO2 (%):  [40 %-100 %] 40 % Set Rate:  [16 bmp-22 bmp] 22 bmp Vt Set:  [580 mL] 580 mL PEEP:  [5 cmH20-8 cmH20] 5 cmH20 Plateau Pressure:  [17 cmH20-21 cmH20] 17 cmH20   Intake/Output Summary (Last 24 hours) at 03/25/2018 0953 Last data filed at 03/25/2018 0900 Gross per 24 hour  Intake 1692.88 ml  Output 2060 ml  Net -367.12 ml   Filed Weights   03/24/18 2000 03/24/18 2304  Weight: 70.3 kg 72.1 kg   Examination: General: critically ill appearing adult male lying in bed in NAD   HEENT: MM pink/moist, ETT  Neuro: sedate / paralyzed  CV: s1s2 rrr, no m/r/g, cooling pads in place  PULM: even/non-labored, lungs bilaterally clear  ZW:CHEN, non-tender, bsx4 active  Extremities: warm/dry, no edema  Skin: no rashes or lesions  Resolved Hospital Problem list     Assessment & Plan:   Vfib cardiac arrest  - unclear etiology, no known underlying cardiac disease, non prior workup, known abnormal EKG in 12/2017 - ddx ACS/ MI, cardiac arrhythmia, electrolyte abnormality.  PE ruled out. - defib x 2, unclear specific time to ROSC - seen normal 10 mins prior to being found, and unknown time of CPR performed by wife till EMS arrival P:  Hypothermia protocol  Cardiology following, appreciate assistance  Trend Troponin, CVP Tele monitoring  Heparin gtt per Pharmacy for possible ACS Amiodarone stopped due to bradycardia   Acute hypoxic respiratory failure in setting of multifocal infiltrates, viral process Coronavirus Positive (NOT COVID) Probable aspiration pneumonia  P:  PRVC 8cc/kg  Wean PEEP / FiO2 for sats >90% VAP prevention measures  Follow CXR   Leukocytosis Aspiration pneumonia + Coronavirus  P:  Narrow abx to unasyn   Trend PCT  Follow cultures  Acute encephalopathy with concern for anoxic injury given unclear arrest  P:  Continue hypothermia  RASS Goal: -4 to -5 with paralytic Await EEG  Await rewarming for neuro prognostication   AKI P:  Trend BMP /  urinary output Replace electrolytes as indicated Avoid nephrotoxic agents, ensure adequate renal perfusion BMP Q2 while on TTM Goal K >4, Mg >2 Stop insulin gtt > await next BMP before replacing K+  Hyperglycemia -A1c 5.3 P:  SSI, sensitive scale CBG Q4   Best practice:  Diet: NPO Pain/Anxiety/Delirium protocol (if indicated): PAD protocol fentanyl/ versed gtt w/ nimbex VAP protocol (if indicated): yes  DVT prophylaxis: SCD/ heparin sq for now GI prophylaxis: PPI  Glucose control: SSI sensitive Mobility: BR Code  Status: Full  Family Communication: Family updated on admit. No family available am 3/18 on NP rounds Disposition: ICU  Labs   CBC: Recent Labs  Lab 03/24/18 2019 03/24/18 2113 03/24/18 2130 03/24/18 2310 03/25/18 0557 03/25/18 0744  WBC 11.2*  --  10.5  --   --  12.8*  NEUTROABS  --   --  6.8  --   --   --   HGB 14.3 13.3 14.1 13.9 13.6 13.4  HCT 44.1 39.0 45.2 41.0 40.0 41.7  MCV 87.0  --  87.6  --   --  85.1  PLT 212  --  224  --   --  188    Basic Metabolic Panel: Recent Labs  Lab 03/24/18 2019 03/24/18 2058 03/24/18 2113 03/24/18 2310 03/25/18 0557 03/25/18 0744  NA 138  --  141 140 142 140  K 3.2*  --  3.9 3.9 3.7 3.7  CL 107  --   --   --   --  114*  CO2 19*  --   --   --   --  16*  GLUCOSE 206*  --   --   --   --  169*  BUN 14  --   --   --   --  14  CREATININE 1.50*  --   --   --   --  1.27*  CALCIUM 8.4*  --   --   --   --  8.1*  MG  --  2.2  --   --   --  1.9  PHOS  --  3.5  --   --   --   --    GFR: Estimated Creatinine Clearance: 61.5 mL/min (A) (by C-G formula based on SCr of 1.27 mg/dL (H)). Recent Labs  Lab 03/24/18 2019 03/24/18 2130 03/25/18 0018 03/25/18 0047 03/25/18 0744 03/25/18 0900  PROCALCITON  --   --   --  <0.10  --   --   WBC 11.2* 10.5  --   --  12.8*  --   LATICACIDVEN  --   --  1.5  --   --  5.1*    Liver Function Tests: Recent Labs  Lab 03/24/18 2058  AST 250*  ALT 232*  ALKPHOS 68  BILITOT 1.1  PROT 6.7  ALBUMIN 3.4*   No results for input(s): LIPASE, AMYLASE in the last 168 hours. No results for input(s): AMMONIA in the last 168 hours.  ABG    Component Value Date/Time   PHART 7.387 03/25/2018 0557   PCO2ART 24.1 (L) 03/25/2018 0557   PO2ART 89.0 03/25/2018 0557   HCO3 15.2 (L) 03/25/2018 0557   TCO2 16 (L) 03/25/2018 0557   ACIDBASEDEF 9.0 (H) 03/25/2018 0557   O2SAT 98.0 03/25/2018 0557     Coagulation Profile: Recent Labs  Lab 03/24/18 2019 03/25/18 0252  INR 1.2 1.3*    Cardiac Enzymes:  Recent Labs  Lab 03/24/18 2058 03/25/18 0048 03/25/18 7939  TROPONINI 0.04* 2.13* 4.80*    HbA1C: Hgb A1c MFr Bld  Date/Time Value Ref Range Status  03/24/2018 09:30 PM 5.3 4.8 - 5.6 % Final    Comment:    (NOTE) Pre diabetes:          5.7%-6.4% Diabetes:              >6.4% Glycemic control for   <7.0% adults with diabetes     CBG: No results for input(s): GLUCAP in the last 168 hours.   Critical care time: 35 minutes      Canary Brim, NP-C [facility] Pgr: [phone] or if no answer [phone] 03/25/2018, 9:53 AM

## 2018-03-25 NOTE — Procedures (Signed)
History: 63 year old male status post cardiac arrest undergoing cooling protocol.  Sedation: None  Technique: This is a 21 channel routine scalp EEG performed at the bedside with bipolar and monopolar montages arranged in accordance to the international 10/20 system of electrode placement. One channel was dedicated to EKG recording.    Background: The background consists predominantly of diffuse 5 to 6 Hz theta range activity with some superimposed generalized irregular slow activity.  There was no definite posterior dominant rhythm or epileptiform activity seen.  Photic stimulation: Physiologic driving is not performed  EEG Abnormalities: 1) generalized irregular slow activity 2) slow background  Clinical Interpretation: This EEG is consistent with a generalized nonspecific cerebral dysfunction (encephalopathy).  Though nonspecific, this could be seen with sedating medication   There was no seizure or seizure predisposition recorded on this study. Please note that lack of epileptiform activity on EEG does not preclude the possibility of epilepsy.   Ritta Slot, MD [facility] [phone]  If 7pm- 7am, please page neurology on call as listed in AMION.

## 2018-03-25 NOTE — Progress Notes (Signed)
CRITICAL VALUE ALERT  Critical Value:  K 2.6  Date & Time Notied:  03/25/2018 1235  Provider Notified: Yes, Canary Brim, NP  Orders Received/Actions taken:

## 2018-03-25 NOTE — Progress Notes (Addendum)
Initial Nutrition Assessment  DOCUMENTATION CODES:   Not applicable  INTERVENTION:  Initiate TF via current OG:  Begin trickle feeds of Vital AF 1.2 at 44mL/hr will assess on follow-up for tolerance and titration to goal.  Vital AF 1.2 @ Goal rate 32mL/hr (total ) Free water flushes per MD recommendations TF formula provides 1872 kcal, 117g Pro., and 1263.47mL H2O, (meets at least 100% of needs).    NUTRITION DIAGNOSIS:   Inadequate oral intake related to inability to eat, acute illness(Cardiac Arrest & Vent) as evidenced by estimated needs, NPO status.   GOAL:   Patient will meet greater than or equal to 90% of their needs   MONITOR:   Vent status, Weight trends, Labs, TF tolerance, I & O's, Skin  REASON FOR ASSESSMENT:   New TF, Ventilator    ASSESSMENT:   Pt is a 28y M from home with PMH of kidney stones. Pt presented to ED with V.fib cardiac arrest, CPR started by wife. Hypothermia protocol started. Pt admitted for cardiac arrest.  Pt is sedated and on vent, unable to provide any information. Pt is currently on TTM (Target Temperature Management).   Patient is currently intubated on ventilator support, fentanyl, versed drips for sedation, paralyzed on nimbex, requiring pressor support on levophed MV: 12.6 L/min Temp (24hrs), Avg:92.1 F (33.4 C), Min:88.7 F (31.5 C), Max:95.3 F (35.2 C)  Labs reviewed:  Ca 7.7 (L) Potassium 2.6 (L) Given KCL Tox, Urine, Positive for Benzodiazepines  Medications reviewed and include:  NovoLog 0-9 Units SSI 0.9% Sodium Chloride Infusion 69mL/hr Magnesium Sulfate 1g   Unable to determine malnutrition status, but pt is at risk.   NUTRITION - FOCUSED PHYSICAL EXAM:    Most Recent Value  Orbital Region  No depletion  Upper Arm Region  Mild depletion  Thoracic and Lumbar Region  Unable to assess  Buccal Region  Unable to assess  Temple Region  Mild depletion  Clavicle Bone Region  No depletion  Clavicle and  Acromion Bone Region  No depletion  Scapular Bone Region  No depletion  Dorsal Hand  No depletion  Patellar Region  Unable to assess  Anterior Thigh Region  Unable to assess  Posterior Calf Region  No depletion  Edema (RD Assessment)  None  Hair  Reviewed  Eyes  Unable to assess  Mouth  Unable to assess  Skin  Reviewed  Nails  Reviewed       Diet Order:   Diet Order    None      EDUCATION NEEDS:   Not appropriate for education at this time  Skin:  Skin Assessment: Reviewed RN Assessment  Last BM:  3/16  Height:   Ht Readings from Last 1 Encounters:  03/24/18 5\' 10"  (1.778 m)    Weight:   Wt Readings from Last 1 Encounters:  03/24/18 72.1 kg    Ideal Body Weight:  75.45 kg  BMI:  Body mass index is 22.81 kg/m.  Estimated Nutritional Needs:   Kcal:  1823.76  Protein:  86.5-144.2 grams  Fluid:  1.5L or MD recommendations    Burnard Bunting, [facility] Dietetic Intern

## 2018-03-25 NOTE — Progress Notes (Signed)
RT Note:  Total of 5 mg Albuterol given. Unable to scan second dose. Per MD.

## 2018-03-25 NOTE — Progress Notes (Signed)
ANTICOAGULATION CONSULT NOTE - Initial Consult  Pharmacy Consult for Heparin Indication: chest pain/ACS  No Known Allergies  Patient Measurements: Height: '5\' 10"'$  (177.8 cm) Weight: 158 lb 15.2 oz (72.1 kg) IBW/kg (Calculated) : 73  Vital Signs: Temp: 91.8 F (33.2 C) (03/18 0445) Temp Source: Bladder (03/18 0445) BP: 100/69 (03/18 0400) Pulse Rate: 42 (03/18 0445)  Labs: Recent Labs    03/24/18 2019 03/24/18 2058 03/24/18 2113 03/24/18 2130 03/24/18 2310 03/25/18 0048 03/25/18 0252  HGB 14.3  --  13.3 14.1 13.9  --   --   HCT 44.1  --  39.0 45.2 41.0  --   --   PLT 212  --   --  224  --   --   --   APTT 26  --   --   --   --   --  31  LABPROT 15.4*  --   --   --   --   --  16.4*  INR 1.2  --   --   --   --   --  1.3*  CREATININE 1.50*  --   --   --   --   --   --   TROPONINI  --  0.04*  --   --   --  2.13*  --     Estimated Creatinine Clearance: 52.1 mL/min (A) (by C-G formula based on SCr of 1.5 mg/dL (H)).   Medical History: History reviewed. No pertinent past medical history.  Medications:  Scheduled:  . artificial tears  1 application Both Eyes [id number]  . chlorhexidine gluconate (MEDLINE KIT)  15 mL Mouth Rinse BID  . Chlorhexidine Gluconate Cloth  6 each Topical Daily  . mouth rinse  15 mL Mouth Rinse 10 times per day  . pantoprazole (PROTONIX) IV  40 mg Intravenous QHS  . sodium chloride flush  10-40 mL Intracatheter Q12H    Assessment: 63 y.o. M presents s/p cardiac arrest. Pt with increased troponin to 2.13. To begin heparin for r/o ACS. No AC PTA. CBC ok at baseline. Pt currently on hypothermia protocol with target temperature 33 degrees.  Goal of Therapy:  Heparin level 0.3-0.7 units/ml Monitor platelets by anticoagulation protocol: Yes   Plan:  Heparin IV bolus 2000 units Heparin gtt at 600 units/hr Will f/u heparin level in 6 hours Daily heparin level and CBC  Sherlon Handing, PharmD, BCPS Clinical pharmacist  **Pharmacist phone directory can  now be found on [email or web address] ([email or web address]).  Listed under [place]. 03/25/2018,5:40 AM

## 2018-03-25 NOTE — Progress Notes (Signed)
Patient heart rate dropped in the 30's.  Stopped amiodarone gtt. Selmer Dominion, NP and Dr. Ardeth Perfect at bedside.  Patient was given neb treatments as ordered to increase heart rate.  Rate increased slightly to 40's.  Also spoke with Dr. Excell Seltzer via phone.  Dr. Excell Seltzer indicated that it was related to the cooling process and that he would evaluate the patient upon arrival to the floor and discuss with CCM what other steps to take.  Monitoring continues.

## 2018-03-25 NOTE — Progress Notes (Signed)
63 year old man admitted 3/17 with V. fib arrest from home.  He has a history of chronic left bundle branch block .  Wife does not report any preceding symptoms CT angiogram chest was negative for pulmonary embolic event showed consolidation both lungs and dilated ascending aorta 4.2 cm. He is undergoing hypothermia protocol, amiodarone stopped overnight due to severe bradycardia.  On exam-induced coma, pupils pinpoint not reactive, heart rate 40s, sinus bradycardia, clear breath sounds bilateral, minimal secretions, soft nontender abdomen, cool extremities, clear urine.  Chest x-ray personally reviewed which shows stents of airspace disease on the right with left relatively clear. Labs show mild increase in creatinine to 1.3, calcium 3.7, troponin 4.8,   Echo performed on per cardiology prelim review seems to show a cardiomyopathy with significant AI.  Impression/plan  V. fib arrest-off amiodarone due to bradycardia, unclear cause will need cardiac cath eventually, await final echo read regarding AI and underlying cardiomyopathy versus post arrest  Acute respiratory failure-vent settings reviewed and adjusted Aspiration pneumonia-change to Unasyn, obtain respiratory culture.  At risk anoxic encephalopathy-hypothermia protocol, rewarming to start at midnight tonight. Monitor potassium. If bradycardia worsens, then will change to normothermia  AKI-good urine output so hopeful for renal recovery, Po kalemia will be repleted  -Updated wife at bedside  The patient is critically ill with multiple organ systems failure and requires high complexity decision making for assessment and support, frequent evaluation and titration of therapies, application of advanced monitoring technologies and extensive interpretation of multiple databases. Critical Care Time devoted to patient care services described in this note independent of APP/resident  time is 35 minutes.   Cyril Mourning MD. Tonny Bollman. [place]  Pulmonary & Critical care Pager [phone] If no response call [phone]   03/25/2018

## 2018-03-25 NOTE — Progress Notes (Addendum)
ANTICOAGULATION CONSULT NOTE   Pharmacy Consult for Heparin Indication: chest pain/ACS  No Known Allergies  Patient Measurements: Height: _0  (177.8 cm) Weight: 158 lb 15.2 oz (72.1 kg) IBW/kg (Calculated) : 73  Vital Signs: Temp: 92.1 F (33.4 C) (03/18 1300) Temp Source: Bladder (03/18 0746) BP: 110/59 (03/18 1200) Pulse Rate: 42 (03/18 1300)  Labs: Recent Labs    03/24/18 2019 03/24/18 2058  03/24/18 2130 03/24/18 2310 03/25/18 0048 03/25/18 0252 03/25/18 0557 03/25/18 0744 03/25/18 1100 03/25/18 1125  HGB 14.3  --    < > 14.1 13.9  --   --  13.6 13.4  --   --   HCT 44.1  --    < > 45.2 41.0  --   --  40.0 41.7  --   --   PLT 212  --   --  224  --   --   --   --  188  --   --   APTT 26  --   --   --   --   --  31  --   --  190*  --   LABPROT 15.4*  --   --   --   --   --  16.4*  --   --  16.4*  --   INR 1.2  --   --   --   --   --  1.3*  --   --  1.3*  --   HEPARINUNFRC  --   --   --   --   --   --   --   --   --   --  0.52  CREATININE 1.50*  --   --   --   --   --   --   --  1.27* 1.50*  --   TROPONINI  --  0.04*  --   --   --  2.13*  --   --  4.80*  --   --    < > = values in this interval not displayed.    Estimated Creatinine Clearance: 52.1 mL/min (A) (by C-G formula based on SCr of 1.5 mg/dL (H)).   Medical History: History reviewed. No pertinent past medical history.  Medications:  Scheduled:  . albuterol  2.5 mg Nebulization Q6H  . artificial tears  1 application Both Eyes T4S  . chlorhexidine gluconate (MEDLINE KIT)  15 mL Mouth Rinse BID  . Chlorhexidine Gluconate Cloth  6 each Topical Daily  . insulin aspart  0-9 Units Subcutaneous Q4H  . mouth rinse  15 mL Mouth Rinse 10 times per day  . pantoprazole (PROTONIX) IV  40 mg Intravenous QHS  . sodium chloride flush  10-40 mL Intracatheter Q12H    Assessment: 63 y.o. M presents s/p cardiac arrest. Pt with increased troponin to 2.13. To begin heparin for r/o ACS. No AC PTA. CBC ok at  baseline. Pt currently on hypothermia protocol with target temperature 33 degrees.  Initial heparin level came back therapeutic at 0.52, on 600 units/hr. APTT reported at 190? - unclear why but no AC PTA so will disregard. CBC remains stable. No s/sx of bleed. No infusion issues. Trop up to 4.8 this morning.   Goal of Therapy:  Heparin level 0.3-0.7 units/ml Monitor platelets by anticoagulation protocol: Yes   Plan:  Continue heparin gtt at 600 units/hr Will f/u heparin level in 6 hours Daily heparin level and CBC  Antonietta Jewel, PharmD, BCCCP Clinical  Pharmacist  Pager: [phone] Phone: [phone] **Pharmacist phone directory can now be found on [email or web address] ([email or web address]).  Listed under [place]. 03/25/2018,1:13 PM

## 2018-03-26 ENCOUNTER — Inpatient Hospital Stay (HOSPITAL_COMMUNITY): Payer: BLUE CROSS/BLUE SHIELD

## 2018-03-26 DIAGNOSIS — I351 Nonrheumatic aortic (valve) insufficiency: Secondary | ICD-10-CM | POA: Diagnosis present

## 2018-03-26 LAB — BASIC METABOLIC PANEL
ANION GAP: 11 (ref 5–15)
Anion gap: 9 (ref 5–15)
Anion gap: 12 (ref 5–15)
Anion gap: 10 (ref 5–15)
Anion gap: 9 (ref 5–15)
Anion gap: 6 (ref 5–15)
Anion gap: 9 (ref 5–15)
Anion gap: 6 (ref 5–15)
Anion gap: 4 — ABNORMAL LOW (ref 5–15)
BUN: 13 mg/dL (ref 8–23)
BUN: 12 mg/dL (ref 8–23)
BUN: 12 mg/dL (ref 8–23)
BUN: 11 mg/dL (ref 8–23)
BUN: 9 mg/dL (ref 8–23)
BUN: 10 mg/dL (ref 8–23)
BUN: 10 mg/dL (ref 8–23)
BUN: 10 mg/dL (ref 8–23)
BUN: 10 mg/dL (ref 8–23)
CHLORIDE: 118 mmol/L — AB (ref 98–111)
CHLORIDE: 122 mmol/L — AB (ref 98–111)
CO2: 12 mmol/L — ABNORMAL LOW (ref 22–32)
CO2: 14 mmol/L — ABNORMAL LOW (ref 22–32)
CO2: 13 mmol/L — ABNORMAL LOW (ref 22–32)
CO2: 14 mmol/L — ABNORMAL LOW (ref 22–32)
CO2: 14 mmol/L — AB (ref 22–32)
CO2: 16 mmol/L — ABNORMAL LOW (ref 22–32)
CO2: 14 mmol/L — ABNORMAL LOW (ref 22–32)
CO2: 17 mmol/L — AB (ref 22–32)
CO2: 17 mmol/L — ABNORMAL LOW (ref 22–32)
CREATININE: 1.4 mg/dL — AB (ref 0.61–1.24)
Calcium: 8 mg/dL — ABNORMAL LOW (ref 8.9–10.3)
Calcium: 8.1 mg/dL — ABNORMAL LOW (ref 8.9–10.3)
Calcium: 8 mg/dL — ABNORMAL LOW (ref 8.9–10.3)
Calcium: 7.7 mg/dL — ABNORMAL LOW (ref 8.9–10.3)
Calcium: 7.8 mg/dL — ABNORMAL LOW (ref 8.9–10.3)
Calcium: 7.8 mg/dL — ABNORMAL LOW (ref 8.9–10.3)
Calcium: 7.8 mg/dL — ABNORMAL LOW (ref 8.9–10.3)
Calcium: 8 mg/dL — ABNORMAL LOW (ref 8.9–10.3)
Calcium: 7.9 mg/dL — ABNORMAL LOW (ref 8.9–10.3)
Chloride: 119 mmol/L — ABNORMAL HIGH (ref 98–111)
Chloride: 118 mmol/L — ABNORMAL HIGH (ref 98–111)
Chloride: 120 mmol/L — ABNORMAL HIGH (ref 98–111)
Chloride: 120 mmol/L — ABNORMAL HIGH (ref 98–111)
Chloride: 121 mmol/L — ABNORMAL HIGH (ref 98–111)
Chloride: 120 mmol/L — ABNORMAL HIGH (ref 98–111)
Chloride: 122 mmol/L — ABNORMAL HIGH (ref 98–111)
Creatinine, Ser: 1.33 mg/dL — ABNORMAL HIGH (ref 0.61–1.24)
Creatinine, Ser: 1.39 mg/dL — ABNORMAL HIGH (ref 0.61–1.24)
Creatinine, Ser: 1.46 mg/dL — ABNORMAL HIGH (ref 0.61–1.24)
Creatinine, Ser: 1.39 mg/dL — ABNORMAL HIGH (ref 0.61–1.24)
Creatinine, Ser: 1.35 mg/dL — ABNORMAL HIGH (ref 0.61–1.24)
Creatinine, Ser: 1.44 mg/dL — ABNORMAL HIGH (ref 0.61–1.24)
Creatinine, Ser: 1.51 mg/dL — ABNORMAL HIGH (ref 0.61–1.24)
Creatinine, Ser: 1.39 mg/dL — ABNORMAL HIGH (ref 0.61–1.24)
GFR calc Af Amer: >60 mL/min (ref >60)
GFR calc Af Amer: >60 mL/min (ref >60)
GFR calc Af Amer: >60 mL/min (ref >60)
GFR calc Af Amer: 59 mL/min — ABNORMAL LOW (ref >60)
GFR calc Af Amer: >60 mL/min (ref >60)
GFR calc Af Amer: >60 mL/min (ref >60)
GFR calc Af Amer: 60 mL/min — ABNORMAL LOW (ref >60)
GFR calc Af Amer: 57 mL/min — ABNORMAL LOW (ref >60)
GFR calc Af Amer: >60 mL/min (ref >60)
GFR calc non Af Amer: 57 mL/min — ABNORMAL LOW (ref >60)
GFR calc non Af Amer: 54 mL/min — ABNORMAL LOW (ref >60)
GFR calc non Af Amer: 53 mL/min — ABNORMAL LOW (ref >60)
GFR calc non Af Amer: 51 mL/min — ABNORMAL LOW (ref >60)
GFR calc non Af Amer: 54 mL/min — ABNORMAL LOW (ref >60)
GFR calc non Af Amer: 56 mL/min — ABNORMAL LOW (ref >60)
GFR calc non Af Amer: 52 mL/min — ABNORMAL LOW (ref >60)
GFR calc non Af Amer: 49 mL/min — ABNORMAL LOW (ref >60)
GFR calc non Af Amer: 54 mL/min — ABNORMAL LOW (ref >60)
GLUCOSE: 124 mg/dL — AB (ref 70–99)
GLUCOSE: 135 mg/dL — AB (ref 70–99)
Glucose, Bld: 153 mg/dL — ABNORMAL HIGH (ref 70–99)
Glucose, Bld: 138 mg/dL — ABNORMAL HIGH (ref 70–99)
Glucose, Bld: 135 mg/dL — ABNORMAL HIGH (ref 70–99)
Glucose, Bld: 133 mg/dL — ABNORMAL HIGH (ref 70–99)
Glucose, Bld: 116 mg/dL — ABNORMAL HIGH (ref 70–99)
Glucose, Bld: 108 mg/dL — ABNORMAL HIGH (ref 70–99)
Glucose, Bld: 104 mg/dL — ABNORMAL HIGH (ref 70–99)
POTASSIUM: 4.3 mmol/L (ref 3.5–5.1)
POTASSIUM: 4.5 mmol/L (ref 3.5–5.1)
Potassium: 3.5 mmol/L (ref 3.5–5.1)
Potassium: 3.7 mmol/L (ref 3.5–5.1)
Potassium: 3.7 mmol/L (ref 3.5–5.1)
Potassium: 3.7 mmol/L (ref 3.5–5.1)
Potassium: 4.1 mmol/L (ref 3.5–5.1)
Potassium: 4.4 mmol/L (ref 3.5–5.1)
Potassium: 4.3 mmol/L (ref 3.5–5.1)
Sodium: 140 mmol/L (ref 135–145)
Sodium: 143 mmol/L (ref 135–145)
Sodium: 143 mmol/L (ref 135–145)
Sodium: 144 mmol/L (ref 135–145)
Sodium: 143 mmol/L (ref 135–145)
Sodium: 143 mmol/L (ref 135–145)
Sodium: 143 mmol/L (ref 135–145)
Sodium: 145 mmol/L (ref 135–145)
Sodium: 143 mmol/L (ref 135–145)

## 2018-03-26 LAB — POCT I-STAT 4, (NA,K, GLUC, HGB,HCT)
GLUCOSE: 187 mg/dL — AB (ref 70–99)
Glucose, Bld: 184 mg/dL — ABNORMAL HIGH (ref 70–99)
Glucose, Bld: 284 mg/dL — ABNORMAL HIGH (ref 70–99)
HCT: 40 % (ref 39.0–52.0)
HCT: 41 % (ref 39.0–52.0)
HEMATOCRIT: 46 % (ref 39.0–52.0)
HEMOGLOBIN: 15.6 g/dL (ref 13.0–17.0)
Hemoglobin: 13.6 g/dL (ref 13.0–17.0)
Hemoglobin: 13.9 g/dL (ref 13.0–17.0)
Potassium: 3.2 mmol/L — ABNORMAL LOW (ref 3.5–5.1)
Potassium: 4 mmol/L (ref 3.5–5.1)
Potassium: 3.9 mmol/L (ref 3.5–5.1)
Sodium: 141 mmol/L (ref 135–145)
Sodium: 141 mmol/L (ref 135–145)
Sodium: 143 mmol/L (ref 135–145)

## 2018-03-26 LAB — POCT I-STAT 7, (LYTES, BLD GAS, ICA,H+H)
Acid-base deficit: 11 mmol/L — ABNORMAL HIGH (ref 0.0–2.0)
Bicarbonate: 12.9 mmol/L — ABNORMAL LOW (ref 20.0–28.0)
Calcium, Ion: 1.16 mmol/L (ref 1.15–1.40)
HCT: 37 % — ABNORMAL LOW (ref 39.0–52.0)
Hemoglobin: 12.6 g/dL — ABNORMAL LOW (ref 13.0–17.0)
O2 Saturation: 98 %
PH ART: 7.399 (ref 7.350–7.450)
Patient temperature: 34
Potassium: 3.9 mmol/L (ref 3.5–5.1)
Sodium: 146 mmol/L — ABNORMAL HIGH (ref 135–145)
TCO2: 14 mmol/L — ABNORMAL LOW (ref 22–32)
pCO2 arterial: 20.3 mmHg — ABNORMAL LOW (ref 32.0–48.0)
pO2, Arterial: 86 mmHg (ref 83.0–108.0)

## 2018-03-26 LAB — COOXEMETRY PANEL
Carboxyhemoglobin: 1.9 % — ABNORMAL HIGH (ref 0.5–1.5)
Methemoglobin: 0.9 % (ref 0.0–1.5)
O2 Saturation: 66.4 %
Total hemoglobin: 10.9 g/dL — ABNORMAL LOW (ref 12.0–16.0)

## 2018-03-26 LAB — CBC
HCT: 40.5 % (ref 39.0–52.0)
Hemoglobin: 13 g/dL (ref 13.0–17.0)
MCH: 26.8 pg (ref 26.0–34.0)
MCHC: 32.1 g/dL (ref 30.0–36.0)
MCV: 83.5 fL (ref 80.0–100.0)
PLATELETS: 168 10*3/uL (ref 150–400)
RBC: 4.85 MIL/uL (ref 4.22–5.81)
RDW: 14.2 % (ref 11.5–15.5)
WBC: 16.2 10*3/uL — ABNORMAL HIGH (ref 4.0–10.5)
nRBC: 0 % (ref 0.0–0.2)

## 2018-03-26 LAB — GLUCOSE, CAPILLARY
GLUCOSE-CAPILLARY: 122 mg/dL — AB (ref 70–99)
GLUCOSE-CAPILLARY: 120 mg/dL — AB (ref 70–99)
Glucose-Capillary: 132 mg/dL — ABNORMAL HIGH (ref 70–99)
Glucose-Capillary: 111 mg/dL — ABNORMAL HIGH (ref 70–99)
Glucose-Capillary: 101 mg/dL — ABNORMAL HIGH (ref 70–99)
Glucose-Capillary: 94 mg/dL (ref 70–99)

## 2018-03-26 LAB — PHOSPHORUS
Phosphorus: 1.8 mg/dL — ABNORMAL LOW (ref 2.5–4.6)
Phosphorus: 2.3 mg/dL — ABNORMAL LOW (ref 2.5–4.6)

## 2018-03-26 LAB — MAGNESIUM
Magnesium: 1.9 mg/dL (ref 1.7–2.4)
Magnesium: 2.1 mg/dL (ref 1.7–2.4)

## 2018-03-26 LAB — HEPARIN LEVEL (UNFRACTIONATED)
Heparin Unfractionated: 0.57 IU/mL (ref 0.30–0.70)
Heparin Unfractionated: 0.35 IU/mL (ref 0.30–0.70)

## 2018-03-26 MED ORDER — ACETAMINOPHEN 325 MG PO TABS
650.0000 mg | ORAL_TABLET | Freq: Four times a day (QID) | ORAL | Status: DC | PRN
  Administered 2018-03-26: 650 mg via ORAL
  Filled 2018-03-26: qty 2

## 2018-03-26 MED ORDER — MAGNESIUM SULFATE IN D5W 1-5 GM/100ML-% IV SOLN
1.0000 g | Freq: Once | INTRAVENOUS | Status: AC
  Administered 2018-03-26: 1 g via INTRAVENOUS
  Filled 2018-03-26: qty 100

## 2018-03-26 MED ORDER — POTASSIUM CHLORIDE 20 MEQ/15ML (10%) PO SOLN
20.0000 meq | Freq: Every day | ORAL | Status: DC
  Administered 2018-03-26 – 2018-04-01 (×6): 20 meq via ORAL
  Filled 2018-03-26 (×6): qty 15

## 2018-03-26 NOTE — Progress Notes (Addendum)
NAME:  Kevin Rojas, MRN:  [id number], DOB:  1955/06/10, LOS: 2 ADMISSION DATE:  03/24/2018, CONSULTATION DATE:  03/24/2018 REFERRING MD:  Dr. Rosalia Hammers, CHIEF COMPLAINT:  Cardiac arrest  Brief History   36 yoM presenting from home with Vfib cardiac arrest.  CPR started by wife.  Found in Vfib shocked twice and king airway placed.  Vomited around [facility] airway. Intubated and stable in ER but not purposeful.  Hypothermia protocol started.  EKG noted for LBBB.  PCCM to admit  Past Medical History  Kidney stones LBBB  Significant Hospital Events   3/17 Admit post VF arrest  3/19 Rewarming initiated   Consults:  Cardiology   Procedures:  3/17 ETT >> 3/17 OGT >> 3/17 R art line >> 3/17 left subclavian CVL >>  Significant Diagnostic Tests:  3/17 [facility] >> negative  3/17 CTA chest >> neg for PE, dependent consolidation in both lungs R>L, multifocal PNA, mild cardiomegaly, dilated ascending aorta to 4.2 cm  3/18 EEG >> generalized nonspecific cerebral dysfunction (encephalopathy), no seizure predisposition  Micro Data:  3/17 BCx 2 >> 3/17 trach asp >> 3/17 RVP >> coronavirus NL63 positive (NOT COVID)  Antimicrobials:  Ceftriaxone 3/17 x1  Azithro 3/17 >> 3/18 Unasyn 3/17 >>  Interim history/subjective:  RN reports pt rewarming in process. No acute events overnight.  Levophed 8 mcg, versed 8 mg, fentanyl 200 mcg's, heparin gtt.    Objective   Blood pressure 112/67, pulse 61, temperature (!) 95.2 F (35.1 C), temperature source Bladder, resp. rate (!) 22, height 5\' 10"  (1.778 m), weight 73.1 kg, SpO2 100 %. CVP:  [3 mmHg-11 mmHg] 3 mmHg  Vent Mode: PRVC FiO2 (%):  [40 %] 40 % Set Rate:  [22 bmp] 22 bmp Vt Set:  [580 mL] 580 mL PEEP:  [5 cmH20] 5 cmH20 Plateau Pressure:  [17 cmH20-18 cmH20] 17 cmH20   Intake/Output Summary (Last 24 hours) at 03/26/2018 1191 Last data filed at 03/26/2018 0900 Gross per 24 hour  Intake 3434.31 ml  Output 1805 ml  Net 1629.31 ml   Filed Weights   03/24/18 2000 03/24/18 2304 03/26/18 0600  Weight: 70.3 kg 72.1 kg 73.1 kg   Examination: General: thin adult male, critically ill appearing lying in bed on vent HEENT: MM pink/moist, ETT Neuro: sedate/paralyzed  CV: s1s2 rrr, no m/r/g PULM: even/non-labored, lungs bilaterally clear, cooling pads in place YN:WGNF, non-tender, bsx4 active  Extremities: warm/dry, no edema  Skin: no rashes or lesions  Resolved Hospital Problem list     Assessment & Plan:   VF Arrest  - hx LBBB, known abnormal EKG in 12/2017 - ddx ACS/ MI, cardiac arrhythmia, electrolyte abnormality.  PE ruled out. Viral illness + - defib x 2, unclear specific time to ROSC - seen normal 10 mins prior to being found, and unknown time of CPR performed by wife till EMS arrival -did not tolerate amiodarone due to bradycardia (during cooling) P:  Continue rewarming, once at goal temp, discontinue paralytics Appreciate Cardiology assistance  ICU monitoring  Continue heparin for ACS Will likely need LHC if has neuro recovery  Acute hypoxic respiratory failure in setting of multifocal infiltrates, viral process Coronavirus Positive (NOT COVID) Probable aspiration pneumonia  P:  PRVC 8cc/kg as rest mode  Wean PEEP / FiO2 for sats >90% Intermittent ABG Follow intermittent CXR  VAP prevention measures   Leukocytosis Aspiration pneumonia + Coronavirus  P:  Continue unasyn, D3/x Follow cultures   Acute Encephalopathy  -r/o anoxic injury given unclear arrest  time P:  EEG as above  Await re-warming for neuro prognostication  Sedation / paralytics per TTM protocol   AKI -suspect pre-renal due to hypotension, arrest  P:  Trend BMP / urinary output Replace electrolytes as indicated, K/Mg 3/19 Avoid nephrotoxic agents, ensure adequate renal perfusion  Hyperglycemia -A1c 5.3 P:  CBG Q4  SSI, sensitive scale  Ascending Aortic Aneurysm  -4.2 cm identified on admit imaging  P: Will need follow up pending  neuro recovery   Best practice:  Diet: NPO Pain/Anxiety/Delirium protocol (if indicated): PAD protocol fentanyl/ versed gtt w/ nimbex VAP protocol (if indicated): yes  DVT prophylaxis: SCD/ heparin sq for now GI prophylaxis: PPI  Glucose control: SSI sensitive Mobility: BR Code Status: Full  Family Communication: No family available am 3/19 on rounds Disposition: ICU  Labs   CBC: Recent Labs  Lab 03/24/18 2019  03/24/18 2130  03/25/18 0533 03/25/18 0557 03/25/18 0744 03/26/18 0250 03/26/18 0537  WBC 11.2*  --  10.5  --   --   --  12.8* 16.2*  --   NEUTROABS  --   --  6.8  --   --   --   --   --   --   HGB 14.3   < > 14.1   < > 13.9 13.6 13.4 13.0 12.6*  HCT 44.1   < > 45.2   < > 41.0 40.0 41.7 40.5 37.0*  MCV 87.0  --  87.6  --   --   --  85.1 83.5  --   PLT 212  --  224  --   --   --  188 168  --    < > = values in this interval not displayed.    Basic Metabolic Panel: Recent Labs  Lab 03/24/18 2058  03/25/18 0744  03/25/18 1702 03/25/18 2116 03/26/18 0250 03/26/18 0520 03/26/18 0537 03/26/18 0626 03/26/18 0859  NA  --    < > 140   < >  --  141 140 143 146* 143 144  K  --    < > 3.7   < >  --  4.3 3.5 3.7 3.9 3.7 3.7  CL  --   --  114*   < >  --  119* 119* 118*  --  118* 120*  CO2  --   --  16*   < >  --  11* 12* 14*  --  13* 14*  GLUCOSE  --    < > 169*   < >  --  182* 153* 138*  --  135* 133*  BUN  --   --  14   < >  --  13 13 12   --  12 11  CREATININE  --   --  1.27*   < >  --  1.45* 1.33* 1.39*  --  1.40* 1.46*  CALCIUM  --   --  8.1*   < >  --  8.0* 8.0* 8.1*  --  8.0* 7.7*  MG 2.2  --  1.9  --  1.8  --  1.9  --   --   --   --   PHOS 3.5  --   --   --  1.2*  --  1.8*  --   --   --   --    < > = values in this interval not displayed.   GFR: Estimated Creatinine Clearance: 54.2 mL/min (A) (by C-G formula based on  SCr of 1.46 mg/dL (H)). Recent Labs  Lab 03/24/18 2019 03/24/18 2130 03/25/18 0018 03/25/18 0047 03/25/18 0744 03/25/18 0900 03/26/18  0250  PROCALCITON  --   --   --  <0.10  --   --   --   WBC 11.2* 10.5  --   --  12.8*  --  16.2*  LATICACIDVEN  --   --  1.5  --   --  5.1*  --     Liver Function Tests: Recent Labs  Lab 03/24/18 2058  AST 250*  ALT 232*  ALKPHOS 68  BILITOT 1.1  PROT 6.7  ALBUMIN 3.4*   No results for input(s): LIPASE, AMYLASE in the last 168 hours. No results for input(s): AMMONIA in the last 168 hours.  ABG    Component Value Date/Time   PHART 7.399 03/26/2018 0537   PCO2ART 20.3 (L) 03/26/2018 0537   PO2ART 86.0 03/26/2018 0537   HCO3 12.9 (L) 03/26/2018 0537   TCO2 14 (L) 03/26/2018 0537   ACIDBASEDEF 11.0 (H) 03/26/2018 0537   O2SAT 98.0 03/26/2018 0537     Coagulation Profile: Recent Labs  Lab 03/24/18 2019 03/25/18 0252 03/25/18 1100  INR 1.2 1.3* 1.3*    Cardiac Enzymes: Recent Labs  Lab 03/24/18 2058 03/25/18 0048 03/25/18 0744 03/25/18 1334  TROPONINI 0.04* 2.13* 4.80* 2.35*    HbA1C: Hgb A1c MFr Bld  Date/Time Value Ref Range Status  03/24/2018 09:30 PM 5.3 4.8 - 5.6 % Final    Comment:    (NOTE) Pre diabetes:          5.7%-6.4% Diabetes:              >6.4% Glycemic control for   <7.0% adults with diabetes     CBG: Recent Labs  Lab 03/25/18 1958 03/25/18 2355 03/26/18 0446 03/26/18 0749 03/26/18 0920  GLUCAP 160* 158* 132* 111* 122*     Critical care time: 30 minutes      Canary Brim, NP-C [place] [facility] Pgr: [phone] or if no answer [phone] 03/26/2018, 9:39 AM

## 2018-03-26 NOTE — Progress Notes (Signed)
Rewarming initiated at 0045.  Settings verified by Sheilah Pigeon, RN.  Following protocol for 33 degree TTM.  Monitoring.

## 2018-03-26 NOTE — Progress Notes (Addendum)
[place] for Heparin Indication: chest pain/ACS  No Known Allergies  Patient Measurements: Height: '5\' 10"'$  (177.8 cm) Weight: 161 lb 2.5 oz (73.1 kg) IBW/kg (Calculated) : 73  Vital Signs: Temp: 94.1 F (34.5 C) (03/19 0700) Temp Source: Bladder (03/18 2200) BP: 118/64 (03/19 0700) Pulse Rate: 55 (03/19 0700)  Labs: Recent Labs    03/24/18 2019  03/24/18 2130  03/25/18 0048  03/25/18 0252  03/25/18 0744 03/25/18 1100 03/25/18 1125 03/25/18 1334  03/25/18 2116 03/26/18 0250 03/26/18 0500 03/26/18 0520 03/26/18 0537  HGB 14.3   < > 14.1   < >  --    < >  --    < > 13.4  --   --   --   --   --  13.0  --   --  12.6*  HCT 44.1   < > 45.2   < >  --    < >  --    < > 41.7  --   --   --   --   --  40.5  --   --  37.0*  PLT 212  --  224  --   --   --   --   --  188  --   --   --   --   --  168  --   --   --   APTT 26  --   --   --   --   --  31  --   --  190*  --  153*  --   --   --   --   --   --   LABPROT 15.4*  --   --   --   --   --  16.4*  --   --  16.4*  --   --   --   --   --   --   --   --   INR 1.2  --   --   --   --   --  1.3*  --   --  1.3*  --   --   --   --   --   --   --   --   HEPARINUNFRC  --   --   --   --   --   --   --   --   --   --  0.52  --   --  0.59  --  0.57  --   --   CREATININE 1.50*  --   --   --   --   --   --   --  1.27* 1.50*  --   --    < > 1.45* 1.33*  --  1.39*  --   TROPONINI  --    < >  --   --  2.13*  --   --   --  4.80*  --   --  2.35*  --   --   --   --   --   --    < > = values in this interval not displayed.    Estimated Creatinine Clearance: 56.9 mL/min (A) (by C-G formula based on SCr of 1.39 mg/dL (H)).   Medical History: History reviewed. No pertinent past medical history.  Medications:  Scheduled:  . albuterol  2.5 mg Nebulization Q6H  . artificial tears  1 application Both  Eyes Q8H  . chlorhexidine gluconate (MEDLINE KIT)  15 mL Mouth Rinse BID  . Chlorhexidine Gluconate Cloth  6 each  Topical Daily  . feeding supplement (VITAL AF 1.2 CAL)  1,000 mL Per Tube Q24H  . insulin aspart  0-9 Units Subcutaneous Q4H  . mouth rinse  15 mL Mouth Rinse 10 times per day  . pantoprazole (PROTONIX) IV  40 mg Intravenous QHS  . sodium chloride flush  10-40 mL Intracatheter Q12H    Assessment: 63 y.o. M presents s/p cardiac arrest. Pt with increased troponin to 2.13. To begin heparin for r/o ACS. No AC PTA. CBC ok at baseline. Pt currently on hypothermia protocol with target temperature 33 degrees.  Heparin level this morning came back therapeutic at 0.57, on 600 units/hr. Hgb stable at 12.6, plt 168. No s/sx of bleeding. No infusion issues. Started rewarming on 3/18 at [place].   Goal of Therapy:  Heparin level 0.3-0.7 units/ml Monitor platelets by anticoagulation protocol: Yes   Plan:  Continue heparin gtt at 600 units/hr Obtain heparin level in 6 hours during rewarming to make sure remains therapeutic  Daily heparin level and CBC  Antonietta Jewel, PharmD, [place] Pharmacist  Pager: [phone] Phone: [phone] 03/26/2018 7:21 AM  ADDENDUM Heparin level came back therapeutic at 0.35, on 600 units/hr. Patient is undergoing rewarming. CBC stable today, no s/sx of bleeding or infusion issues. Will increase infusion to 700 units/hr to keep in goal range.   Antonietta Jewel, PharmD, [place] Clinical Pharmacist

## 2018-03-26 NOTE — Progress Notes (Signed)
eLink Physician-Brief Progress Note Patient Name: Kevin Rojas DOB: 1955-04-23 MRN: [id number]   Date of Service  03/26/2018  HPI/Events of Note  Notified of ABG results - 7.399/20.3/86/13/98%  eICU Interventions  Well compensated metabolic acidosis.  No indication of HCO3 at this point.     Intervention Category Intermediate Interventions: Diagnostic test evaluation  Larinda Buttery 03/26/2018, 6:35 AM

## 2018-03-26 NOTE — Progress Notes (Signed)
NAME:  Kevin Rojas, MRN:  [id number], DOB:  05/30/1955, LOS: 2 ADMISSION DATE:  03/24/2018, CONSULTATION DATE:  03/24/2018 REFERRING MD:  Dr. Rosalia Hammers, CHIEF COMPLAINT:  Cardiac arrest  Brief History   55 yoM presenting from home with Vfib cardiac arrest.  CPR started by wife.  Found in Vfib shocked twice and king airway placed.  Vomited around [facility] airway. Intubated and stable in ER but not purposeful.  Hypothermia protocol started.  EKG noted for LBBB.  PCCM to admit  Past Medical History  Kidney stones  Significant Hospital Events   3/17 Admit post VF arrest  3/18   Amiodarone discontinued due to bradycardia.  Consults:  Cards   Procedures:  3/17 ETT >> 3/17 OGT >> 3/17 R art line >> 3/17 left subclavian CVL >>  Significant Diagnostic Tests:  3/17 [facility] >> negative  3/17 CTA chest >> neg for PE, dependent consolidation in both lungs R>L, multifocal PNA, mild cardiomegaly, dilated ascending aorta to 4.2 cm   EEG 3/18 no seizures  Micro Data:  3/17 BCx 2 >> 3/17 trach asp >> 3/17 RVP >> coronavirus NL63 positive (NOT COVID)  Antimicrobials:  Ceftriaxone 3/17 x1  Azithro 3/17 >> 3/18 Unasyn 3/17 >>  Interim history/subjective:   In rewarming phase of hypothermia protocol. Remains critically ill, sedated and paralyzed on low-dose Levophed Good urine output  Objective   Blood pressure 112/67, pulse 61, temperature (!) 95.2 F (35.1 C), temperature source Bladder, resp. rate (!) 22, height 5\' 10"  (1.778 m), weight 73.1 kg, SpO2 100 %. CVP:  [3 mmHg-11 mmHg] 3 mmHg  Vent Mode: PRVC FiO2 (%):  [40 %] 40 % Set Rate:  [22 bmp] 22 bmp Vt Set:  [580 mL] 580 mL PEEP:  [5 cmH20] 5 cmH20 Plateau Pressure:  [17 cmH20-18 cmH20] 17 cmH20   Intake/Output Summary (Last 24 hours) at 03/26/2018 0950 Last data filed at 03/26/2018 0900 Gross per 24 hour  Intake 3434.31 ml  Output 1805 ml  Net 1629.31 ml   Filed Weights   03/24/18 2000 03/24/18 2304 03/26/18 0600  Weight: 70.3 kg  72.1 kg 73.1 kg   Examination: Acutely ill, in induced coma Pupils pinpoint, orally intubated, no pallor or icterus Clear breath sounds bilateral, minimal secretions S1-S2 bradycardia, regular sinus rhythm Soft nontender abdomen Cool extremities, clear urine  X-ray 3/19 personally reviewed which shows bilateral infiltrates right more than left consistent with aspiration   Resolved Hospital Problem list     Assessment & Plan:   Vfib cardiac arrest  - unclear etiology, no known underlying cardiac disease, no prior workup, known LBBB 12/2017 - ddx ACS/ MI, cardiac arrhythmia, electrolyte abnormality.  PE ruled out. - defib x 2, unclear specific time to ROSC - seen normal 10 mins prior to being found, and unknown time of CPR performed by wife till EMS arrival P:  Hypothermia protocol  Cardiology following, cath if full recovery Heparin gtt per Pharmacy  Amiodarone stopped due to bradycardia   Acute hypoxic respiratory failure in setting of multifocal infiltrates, viral process Coronavirus Positive (NOT COVID) Aspiration pneumonia  P:  PRVC 8cc/kg  Wean PEEP / FiO2 for sats >90% VAP prevention measures  Ct unasyn, await respiratory culture    Acute encephalopathy with concern for anoxic injury given unclear arrest  EEG negative seizures P:  Continue hypothermia  RASS Goal: -4 to -5 with paralytic Await neuro check post rewarming for neuro prognostication   AKI P:  Trend BMP / urinary output Replace electrolytes  as indicated Avoid nephrotoxic agents, ensure adequate renal perfusion We will hold off replacing potassium while rewarming  Hyperglycemia -A1c 5.3 P:  SSI, sensitive scale CBG Q4   Best practice:  Diet: NPO Pain/Anxiety/Delirium protocol (if indicated): PAD protocol fentanyl/ versed gtt w/ nimbex VAP protocol (if indicated): yes  DVT prophylaxis: SCD/ heparin sq for now GI prophylaxis: PPI  Glucose control: SSI sensitive Mobility: BR Code Status:  Full  Family Communication: Updated wife at bedside Disposition: ICU   The patient is critically ill with multiple organ systems failure and requires high complexity decision making for assessment and support, frequent evaluation and titration of therapies, application of advanced monitoring technologies and extensive interpretation of multiple databases. Critical Care Time devoted to patient care services described in this note independent of APP/resident  time is 35 minutes.   Cyril Mourning MD. Tonny Bollman. [facility] Pager [phone] If no response call [phone]     03/26/2018, 9:50 AM

## 2018-03-26 NOTE — Progress Notes (Signed)
Progress Note  Patient Name: Cypher Paule Date of Encounter: 03/26/2018  Primary Cardiologist: No primary care provider on file.   Subjective   Intubated/sedated no history obtainable  Inpatient Medications    Scheduled Meds:  albuterol  2.5 mg Nebulization Q6H   artificial tears  1 application Both Eyes O3J   chlorhexidine gluconate (MEDLINE KIT)  15 mL Mouth Rinse BID   Chlorhexidine Gluconate Cloth  6 each Topical Daily   feeding supplement (VITAL AF 1.2 CAL)  1,000 mL Per Tube Q24H   insulin aspart  0-9 Units Subcutaneous Q4H   mouth rinse  15 mL Mouth Rinse 10 times per day   pantoprazole (PROTONIX) IV  40 mg Intravenous QHS   potassium chloride  20 mEq Oral Daily   sodium chloride flush  10-40 mL Intracatheter Q12H   Continuous Infusions:  sodium chloride 50 mL/hr at 03/26/18 1000   ampicillin-sulbactam (UNASYN) IV Stopped (03/26/18 0844)   cisatracurium (NIMBEX) infusion 1 mcg/kg/min (03/26/18 1000)   fentaNYL infusion INTRAVENOUS 200 mcg/hr (03/26/18 1000)   heparin 600 Units/hr (03/26/18 1000)   magnesium sulfate 1 - 4 g bolus IVPB     midazolam 8 mg/hr (03/26/18 1000)   norepinephrine (LEVOPHED) Adult infusion 8 mcg/min (03/26/18 1000)   PRN Meds: [COMPLETED] cisatracurium **AND** cisatracurium (NIMBEX) infusion **AND** cisatracurium, fentaNYL, midazolam, sodium chloride flush   Vital Signs    Vitals:   03/26/18 0744 03/26/18 0800 03/26/18 0900 03/26/18 1000  BP: 131/63 122/66 112/67 114/63  Pulse: (!) 56 (!) 59 61 61  Resp:  (!) 22 (!) 22 (!) 22  Temp:  (!) 94.5 F (34.7 C) (!) 95.2 F (35.1 C) (!) 95.5 F (35.3 C)  TempSrc:  Bladder Bladder Bladder  SpO2:  100% 100% 100%  Weight:      Height:        Intake/Output Summary (Last 24 hours) at 03/26/2018 1056 Last data filed at 03/26/2018 1000 Gross per 24 hour  Intake 3383.33 ml  Output 1765 ml  Net 1618.33 ml   Last 3 Weights 03/26/2018 03/24/2018 03/24/2018  Weight (lbs) 161  lb 2.5 oz 158 lb 15.2 oz 154 lb 15.7 oz  Weight (kg) 73.1 kg 72.1 kg 70.3 kg      Telemetry    Sinus bradycardia/sinus rhythm with PVCs - Personally Reviewed   Physical Exam  Intubated/sedated/paralyzed, unresponsive GEN: No acute distress.   Neck: No JVD Cardiac: RRR, 2/6 systolic murmur at the left lower sternal border, heart sounds distant Respiratory: Clear to auscultation bilaterally. GI: Soft, nontender, non-distended  MS: No edema; No deformity. Skin: Warm and dry without rash  Labs    Chemistry Recent Labs  Lab 03/24/18 2058  03/26/18 0520 03/26/18 0537 03/26/18 0626 03/26/18 0859  NA  --    < > 143 146* 143 144  K  --    < > 3.7 3.9 3.7 3.7  CL  --    < > 118*  --  118* 120*  CO2  --    < > 14*  --  13* 14*  GLUCOSE  --    < > 138*  --  135* 133*  BUN  --    < > 12  --  12 11  CREATININE  --    < > 1.39*  --  1.40* 1.46*  CALCIUM  --    < > 8.1*  --  8.0* 7.7*  PROT 6.7  --   --   --   --   --  ALBUMIN 3.4*  --   --   --   --   --   AST 250*  --   --   --   --   --   ALT 232*  --   --   --   --   --   ALKPHOS 68  --   --   --   --   --   BILITOT 1.1  --   --   --   --   --   GFRNONAA  --    < > 54*  --  53* 51*  GFRAA  --    < > >60  --  >60 59*  ANIONGAP  --    < > 11  --  12 10   < > = values in this interval not displayed.     Hematology Recent Labs  Lab 03/24/18 2130  03/25/18 0744 03/26/18 0250 03/26/18 0537  WBC 10.5  --  12.8* 16.2*  --   RBC 5.16  --  4.90 4.85  --   HGB 14.1   < > 13.4 13.0 12.6*  HCT 45.2   < > 41.7 40.5 37.0*  MCV 87.6  --  85.1 83.5  --   MCH 27.3  --  27.3 26.8  --   MCHC 31.2  --  32.1 32.1  --   RDW 14.0  --  14.1 14.2  --   PLT 224  --  188 168  --    < > = values in this interval not displayed.    Cardiac Enzymes Recent Labs  Lab 03/24/18 2058 03/25/18 0048 03/25/18 0744 03/25/18 1334  TROPONINI 0.04* 2.13* 4.80* 2.35*    Recent Labs  Lab 03/24/18 2112  TROPIPOC 0.11*     BNPNo results for  input(s): BNP, PROBNP in the last 168 hours.   DDimer No results for input(s): DDIMER in the last 168 hours.   Radiology    Ct Head Wo Contrast  Result Date: 03/24/2018 CLINICAL DATA:  Cardiac arrest EXAM: CT HEAD WITHOUT CONTRAST TECHNIQUE: Contiguous axial images were obtained from the base of the skull through the vertex without intravenous contrast. COMPARISON:  None. FINDINGS: Brain: No acute territorial infarction, hemorrhage or intracranial mass. The ventricles are of normal size Vascular: No hyperdense vessels.  No unexpected calcification Skull: Normal. Negative for fracture or focal lesion. Sinuses/Orbits: Mucosal thickening in the ethmoid sinuses. Large amount of secretions within the posterior nasopharynx with scattered hyperdense foci, possibly hemorrhage. Other: None IMPRESSION: 1. Negative non contrasted CT appearance of the brain 2. Large amount of secretions at the posterior nasopharynx, possibly with small amount of hemorrhagic secretion. Electronically Signed   By: Donavan Foil M.D.   On: 03/24/2018 22:39   Ct Angio Chest Pe W Or Wo Contrast  Result Date: 03/24/2018 CLINICAL DATA:  Cardiac arrest.  Intubated patient. EXAM: CT ANGIOGRAPHY CHEST WITH CONTRAST TECHNIQUE: Multidetector CT imaging of the chest was performed using the standard protocol during bolus administration of intravenous contrast. Multiplanar CT image reconstructions and MIPs were obtained to evaluate the vascular anatomy. CONTRAST:  59m OMNIPAQUE IOHEXOL 350 MG/ML SOLN COMPARISON:  Current chest radiographs. FINDINGS: Cardiovascular: There is satisfactory opacification of the pulmonary arteries to the segmental level. There is no evidence of a pulmonary embolism. Heart is mildly enlarged. No pericardial effusion. No coronary artery calcifications. Ascending aorta is dilated to 4.4 cm. Aorta is not opacified. No atherosclerotic calcifications. Mediastinum/Nodes: No neck base, no  neck base or axillary masses or  enlarged lymph nodes. Endotracheal tube and nasal/orogastric tube are well positioned. Subcentimeter shotty mediastinal lymph nodes. No discrete enlarged mediastinal or hilar lymph nodes. No masses. Trachea is unremarkable. Lungs/Pleura: Bilateral dependent lung consolidation. Airspace consolidation is noted in the posterior right upper lobe with a lesser degree of posterior consolidation in the right lower lobe. There is less extensive consolidation in the posterior left upper lobe and left lower lobe. No evidence of pulmonary edema. No pleural effusion and no pneumothorax. Upper Abdomen: No acute abnormality. Musculoskeletal: No chest wall abnormality. No acute or significant osseous findings. Review of the MIP images confirms the above findings. IMPRESSION: 1. No evidence of a pulmonary embolus. 2. Dependent consolidation in both lungs, right greater than left. A significant portion of this is likely due to multifocal pneumonia. Some of this dependent opacity, particularly in the lower lobes, is likely atelectasis. 3. There is no evidence of pulmonary edema.  No pleural effusion. 4. Endotracheal and nasogastric tubes are well position. 5. Mild cardiomegaly. 6. Dilated ascending aorta to 4.2 cm. Recommend annual imaging followup by CTA or MRA. This recommendation follows 2010 ACCF/AHA/AATS/ACR/ASA/SCA/SCAI/SIR/STS/SVM Guidelines for the Diagnosis and Management of Patients with Thoracic Aortic Disease. Circulation. 2010; 121: Z610-R604. Aortic aneurysm NOS (ICD10-I71.9) Aortic aneurysm NOS (ICD10-I71.9). Electronically Signed   By: Lajean Manes M.D.   On: 03/24/2018 22:44   Dg Chest Port 1 View  Result Date: 03/26/2018 CLINICAL DATA:  Intubation.  Respiratory failure. EXAM: PORTABLE CHEST 1 VIEW COMPARISON:  None. FINDINGS: Endotracheal tube, left subclavian line, NG tube in stable position. Bilateral pulmonary infiltrates particularly prominent throughout the right lung again noted. No interim change. Small  right pleural effusion can not be excluded. Scratched it small bilateral pleural effusions could not be excluded. No pneumothorax. IMPRESSION: 1.  Lines and tubes in stable position. 2. Bilateral pulmonary infiltrates particularly prominent throughout the right lung again noted. No significant interim change. Small bilateral pleural effusions could not be excluded. Electronically Signed   By: Marcello Moores  Register   On: 03/26/2018 06:46   Dg Chest Port 1 View  Result Date: 03/25/2018 CLINICAL DATA:  Endotracheal intubation EXAM: PORTABLE CHEST 1 VIEW COMPARISON:  Yesterday FINDINGS: Endotracheal tube tip just below the clavicular heads. The orogastric tube at least reaches the stomach. Left subclavian line with tip at the SVC. Extensive lung opacity greater on the right correlating with atelectasis and consolidation by CT. There has been mild progression since yesterday. No visible pneumothorax. IMPRESSION: 1. Unremarkable hardware positioning. 2. History of aspiration pneumonia with mildly increased opacity. Electronically Signed   By: Monte Fantasia M.D.   On: 03/25/2018 06:47   Dg Chest Port 1 View  Result Date: 03/24/2018 CLINICAL DATA:  Status post central line placement. EXAM: PORTABLE CHEST 1 VIEW COMPARISON:  03/24/2018 at 2008 hours FINDINGS: New left subclavian central venous line has its tip projecting in the lower superior vena cava. Endotracheal tube is stable and well positioned. Hazy lung opacities noted previously are not significantly changed, allowing for differences in patient positioning and technique. No pneumothorax. IMPRESSION: 1. Left subclavian central venous line has its tip in the lower superior vena cava. No pneumothorax. 2. No other change from the earlier exam. Electronically Signed   By: Lajean Manes M.D.   On: 03/24/2018 22:13   Dg Chest Portable 1 View  Result Date: 03/24/2018 CLINICAL DATA:  Status post CPR, check endotracheal tube placement EXAM: PORTABLE CHEST 1 VIEW  COMPARISON:  12/31/2017 FINDINGS:  Cardiac shadow is mildly prominent but accentuated by the portable technique. Endotracheal tube is noted approximately 4.5 cm above the carina. Diffuse central vascular congestion is noted as well as increased density throughout the right upper lobe. These changes likely represent mild pulmonary edema. No pneumothorax is seen. No bony abnormality is noted. IMPRESSION: Increased central vascular congestion with some changes suggestive of pulmonary edema particularly in the right upper lobe. Continued follow-up is recommended. Electronically Signed   By: Inez Catalina M.D.   On: 03/24/2018 20:29    Cardiac Studies   2D echocardiogram: IMPRESSIONS    1. The left ventricle has mildly reduced systolic function, with an ejection fraction of 45-50%. The cavity size was mildly dilated. Left ventricular diastolic Doppler parameters are consistent with pseudonormalization.  2. The right ventricle has normal systolic function. The cavity was normal. There is no increase in right ventricular wall thickness.  3. Right atrial size was mildly dilated.  4. Trivial pericardial effusion is present.  5. Mild thickening of the mitral valve leaflet.  6. The aortic valveis bicuspid Aortic valve regurgitation is severe by color flow Doppler.  7. The aortic valve appears to be a bicuspid or functionally bicuspid AV. There is severe AI.     The aortic root is mildly dilated.  8. There is mild dilatation of the ascending aorta.  9.     The aortic valve appears to be a bicuspid or functionally bicuspid AV. There is severe AI.     The aortic root is mildly dilated .     There is concern for a Type A aortic dissection.     Suggest STAT CT angiogram of the chest for further evaluation.           10. The inferior vena cava was normal in size with <50% respiratory variability. 11. The interatrial septum was not assessed.  CTA Chest: IMPRESSION: 1. No evidence of a pulmonary  embolus. 2. Dependent consolidation in both lungs, right greater than left. A significant portion of this is likely due to multifocal pneumonia. Some of this dependent opacity, particularly in the lower lobes, is likely atelectasis. 3. There is no evidence of pulmonary edema.  No pleural effusion. 4. Endotracheal and nasogastric tubes are well position. 5. Mild cardiomegaly. 6. Dilated ascending aorta to 4.2 cm. Recommend annual imaging  Patient Profile     63 y.o. male with out of hospital ventricular fibrillation arrest  Assessment & Plan    1.  Ventricular fibrillation cardiac arrest: The patient's heart rhythm remained stable with normal sinus rhythm.  Amiodarone discontinued yesterday because of marked bradycardia and QT prolongation.  The patient has a stable rhythm today.  Will follow.  2.  Severe aortic valve insufficiency: Possible that this is longstanding and with associated cardiomyopathy could be the etiology of his V. fib arrest.  3.  Non-STEMI: Troponin peak at 4.8 mg/dL.  No regional wall motion abnormalities on echo.  Suspect troponin elevation related to demand ischemia rather than true ACS.  Overall prognosis remains guarded.  Will await his neurologic recovery after he rewarms today.  Will follow his cardiac status and if he has good functional recovery anticipate proceeding forward with cardiac evaluation including heart catheterization and further assessment of his coronary anatomy and hemodynamics in the context of severe aortic insufficiency.      For questions or updates, please contact [place] Please consult [email or web address] for contact info under        Signed, Sherren Mocha, MD  03/26/2018, 10:56 AM

## 2018-03-27 DIAGNOSIS — J9601 Acute respiratory failure with hypoxia: Secondary | ICD-10-CM

## 2018-03-27 DIAGNOSIS — T17908A Unspecified foreign body in respiratory tract, part unspecified causing other injury, initial encounter: Secondary | ICD-10-CM

## 2018-03-27 LAB — CULTURE, RESPIRATORY W GRAM STAIN: Culture: NORMAL

## 2018-03-27 LAB — CBC
HCT: 34.7 % — ABNORMAL LOW (ref 39.0–52.0)
Hemoglobin: 11.2 g/dL — ABNORMAL LOW (ref 13.0–17.0)
MCH: 26.8 pg (ref 26.0–34.0)
MCHC: 32.3 g/dL (ref 30.0–36.0)
MCV: 83 fL (ref 80.0–100.0)
Platelets: 142 10*3/uL — ABNORMAL LOW (ref 150–400)
RBC: 4.18 MIL/uL — ABNORMAL LOW (ref 4.22–5.81)
RDW: 14.7 % (ref 11.5–15.5)
WBC: 9.7 10*3/uL (ref 4.0–10.5)
nRBC: 0 % (ref 0.0–0.2)

## 2018-03-27 LAB — POCT I-STAT 7, (LYTES, BLD GAS, ICA,H+H)
Acid-base deficit: 4 mmol/L — ABNORMAL HIGH (ref 0.0–2.0)
Bicarbonate: 18.1 mmol/L — ABNORMAL LOW (ref 20.0–28.0)
Calcium, Ion: 1.21 mmol/L (ref 1.15–1.40)
HCT: 34 % — ABNORMAL LOW (ref 39.0–52.0)
HEMOGLOBIN: 11.6 g/dL — AB (ref 13.0–17.0)
O2 Saturation: 100 %
Patient temperature: 36.3
Potassium: 4.2 mmol/L (ref 3.5–5.1)
Sodium: 144 mmol/L (ref 135–145)
TCO2: 19 mmol/L — ABNORMAL LOW (ref 22–32)
pCO2 arterial: 23.7 mmHg — ABNORMAL LOW (ref 32.0–48.0)
pH, Arterial: 7.488 — ABNORMAL HIGH (ref 7.350–7.450)
pO2, Arterial: 162 mmHg — ABNORMAL HIGH (ref 83.0–108.0)

## 2018-03-27 LAB — BASIC METABOLIC PANEL
Anion gap: 6 (ref 5–15)
BUN: 10 mg/dL (ref 8–23)
CHLORIDE: 119 mmol/L — AB (ref 98–111)
CO2: 16 mmol/L — ABNORMAL LOW (ref 22–32)
CREATININE: 1.35 mg/dL — AB (ref 0.61–1.24)
Calcium: 8 mg/dL — ABNORMAL LOW (ref 8.9–10.3)
GFR calc Af Amer: >60 mL/min (ref >60)
GFR calc non Af Amer: 56 mL/min — ABNORMAL LOW (ref >60)
Glucose, Bld: 115 mg/dL — ABNORMAL HIGH (ref 70–99)
POTASSIUM: 4.1 mmol/L (ref 3.5–5.1)
Sodium: 141 mmol/L (ref 135–145)

## 2018-03-27 LAB — MAGNESIUM: Magnesium: 2 mg/dL (ref 1.7–2.4)

## 2018-03-27 LAB — PHOSPHORUS: Phosphorus: 2.8 mg/dL (ref 2.5–4.6)

## 2018-03-27 LAB — GLUCOSE, CAPILLARY
Glucose-Capillary: 87 mg/dL (ref 70–99)
Glucose-Capillary: 117 mg/dL — ABNORMAL HIGH (ref 70–99)
Glucose-Capillary: 119 mg/dL — ABNORMAL HIGH (ref 70–99)
Glucose-Capillary: 66 mg/dL — ABNORMAL LOW (ref 70–99)
Glucose-Capillary: 88 mg/dL (ref 70–99)
Glucose-Capillary: 90 mg/dL (ref 70–99)

## 2018-03-27 LAB — HEPARIN LEVEL (UNFRACTIONATED): Heparin Unfractionated: 0.3 IU/mL (ref 0.30–0.70)

## 2018-03-27 MED ORDER — VITAL AF 1.2 CAL PO LIQD
1000.0000 mL | ORAL | Status: DC
  Administered 2018-03-27: 1000 mL

## 2018-03-27 MED ORDER — HEPARIN SODIUM (PORCINE) 5000 UNIT/ML IJ SOLN
5000.0000 [IU] | Freq: Three times a day (TID) | INTRAMUSCULAR | Status: DC
  Administered 2018-03-27 – 2018-04-02 (×18): 5000 [IU] via SUBCUTANEOUS
  Filled 2018-03-27 (×18): qty 1

## 2018-03-27 MED ORDER — HYDRALAZINE HCL 20 MG/ML IJ SOLN
10.0000 mg | INTRAMUSCULAR | Status: DC | PRN
  Administered 2018-03-27 (×2): 10 mg via INTRAVENOUS
  Administered 2018-03-27 – 2018-03-28 (×2): 20 mg via INTRAVENOUS
  Filled 2018-03-27 (×3): qty 1

## 2018-03-27 MED ORDER — DEXTROSE 50 % IV SOLN
INTRAVENOUS | Status: AC
  Filled 2018-03-27: qty 50

## 2018-03-27 MED ORDER — PRO-STAT SUGAR FREE PO LIQD
30.0000 mL | Freq: Every day | ORAL | Status: DC
  Administered 2018-03-27: 30 mL
  Filled 2018-03-27: qty 30

## 2018-03-27 MED ORDER — FENTANYL CITRATE (PF) 100 MCG/2ML IJ SOLN
INTRAMUSCULAR | Status: AC
  Administered 2018-03-27: 50 ug via INTRAVENOUS
  Filled 2018-03-27: qty 2

## 2018-03-27 MED ORDER — DEXTROSE 50 % IV SOLN
25.0000 mL | Freq: Once | INTRAVENOUS | Status: AC
  Administered 2018-03-27: 25 mL via INTRAVENOUS

## 2018-03-27 MED ORDER — DEXTROSE-NACL 5-0.45 % IV SOLN
INTRAVENOUS | Status: DC
  Administered 2018-03-27 – 2018-03-28 (×2): via INTRAVENOUS

## 2018-03-27 MED ORDER — FENTANYL CITRATE (PF) 100 MCG/2ML IJ SOLN
50.0000 ug | INTRAMUSCULAR | Status: DC | PRN
  Administered 2018-03-27 – 2018-03-28 (×2): 50 ug via INTRAVENOUS
  Filled 2018-03-27: qty 2

## 2018-03-27 NOTE — Progress Notes (Signed)
Progress Note  Patient Name: Kevin Rojas Date of Encounter: 03/27/2018  Primary Cardiologist: No primary care provider on file.   Subjective   Intubated/sedated  Inpatient Medications    Scheduled Meds: . albuterol  2.5 mg Nebulization Q6H  . artificial tears  1 application Both Eyes S0Y  . chlorhexidine gluconate (MEDLINE KIT)  15 mL Mouth Rinse BID  . Chlorhexidine Gluconate Cloth  6 each Topical Daily  . feeding supplement (VITAL AF 1.2 CAL)  1,000 mL Per Tube Q24H  . heparin injection (subcutaneous)  5,000 Units Subcutaneous Q8H  . insulin aspart  0-9 Units Subcutaneous Q4H  . mouth rinse  15 mL Mouth Rinse 10 times per day  . pantoprazole (PROTONIX) IV  40 mg Intravenous QHS  . potassium chloride  20 mEq Oral Daily  . sodium chloride flush  10-40 mL Intracatheter Q12H   Continuous Infusions: . sodium chloride Stopped (03/27/18 0732)  . ampicillin-sulbactam (UNASYN) IV 200 mL/hr at 03/27/18 0800  . cisatracurium (NIMBEX) infusion Stopped (03/26/18 1401)  . fentaNYL infusion INTRAVENOUS Stopped (03/26/18 1843)  . midazolam Stopped (03/27/18 0734)  . norepinephrine (LEVOPHED) Adult infusion Stopped (03/27/18 0523)   PRN Meds: acetaminophen, [COMPLETED] cisatracurium **AND** cisatracurium (NIMBEX) infusion **AND** cisatracurium, fentaNYL, midazolam, sodium chloride flush   Vital Signs    Vitals:   03/27/18 0800 03/27/18 0815 03/27/18 0822 03/27/18 0830  BP: 127/74 123/71 (!) 133/59 133/80  Pulse: 62 61  89  Resp: (!) 22 (!) 22 (!) 22 (!) 22  Temp: 98.4 F (36.9 C)     TempSrc: Bladder     SpO2: 100% 100%  100%  Weight:      Height:        Intake/Output Summary (Last 24 hours) at 03/27/2018 0834 Last data filed at 03/27/2018 0800 Gross per 24 hour  Intake 2821.11 ml  Output 2625 ml  Net 196.11 ml   Last 3 Weights 03/27/2018 03/26/2018 03/24/2018  Weight (lbs) 167 lb 1.7 oz 161 lb 2.5 oz 158 lb 15.2 oz  Weight (kg) 75.8 kg 73.1 kg 72.1 kg      Telemetry     Sinus rhythm with occasional PVCs and 1 5 beat ventricular run - Personally Reviewed   Physical Exam  Intubated/sedated, seems to have a sluggish response to sternal rub GEN: No acute distress.   Neck: No JVD Cardiac: RRR, distant heart sounds, difficult to appreciate a murmur today Respiratory: Clear to auscultation bilaterally. GI: Soft, no masses, non-distended  MS:  Diffuse trace to 1+ edema; No deformity. Neuro:   Minimally responsive but on sedation, recently weaned off  [place]  Lab 03/24/18 2058  03/26/18 1616 03/26/18 1803 03/27/18 0426 03/27/18 0443  NA  --    < > 145 143 141 144  K  --    < > 4.3 4.5 4.1 4.2  CL  --    < > 122* 122* 119*  --   CO2  --    < > 17* 17* 16*  --   GLUCOSE  --    < > 108* 104* 115*  --   BUN  --    < > '10 10 10  '$ --   CREATININE  --    < > 1.51* 1.39* 1.35*  --   CALCIUM  --    < > 8.0* 7.9* 8.0*  --   PROT 6.7  --   --   --   --   --  ALBUMIN 3.4*  --   --   --   --   --   AST 250*  --   --   --   --   --   ALT 232*  --   --   --   --   --   ALKPHOS 68  --   --   --   --   --   BILITOT 1.1  --   --   --   --   --   GFRNONAA  --    < > 49* 54* 56*  --   GFRAA  --    < > 57* >60 >60  --   ANIONGAP  --    < > 6 4* 6  --    < > = values in this interval not displayed.     Hematology Recent Labs  Lab 03/25/18 0744 03/26/18 0250 03/26/18 0537 03/27/18 0426 03/27/18 0443  WBC 12.8* 16.2*  --  9.7  --   RBC 4.90 4.85  --  4.18*  --   HGB 13.4 13.0 12.6* 11.2* 11.6*  HCT 41.7 40.5 37.0* 34.7* 34.0*  MCV 85.1 83.5  --  83.0  --   MCH 27.3 26.8  --  26.8  --   MCHC 32.1 32.1  --  32.3  --   RDW 14.1 14.2  --  14.7  --   PLT 188 168  --  142*  --     Cardiac Enzymes Recent Labs  Lab 03/24/18 2058 03/25/18 0048 03/25/18 0744 03/25/18 1334  TROPONINI 0.04* 2.13* 4.80* 2.35*    Recent Labs  Lab 03/24/18 2112  TROPIPOC 0.11*     BNPNo results for input(s): BNP, PROBNP in the last 168 hours.    DDimer No results for input(s): DDIMER in the last 168 hours.   Radiology    Dg Chest Port 1 View  Result Date: 03/26/2018 CLINICAL DATA:  Intubation.  Respiratory failure. EXAM: PORTABLE CHEST 1 VIEW COMPARISON:  None. FINDINGS: Endotracheal tube, left subclavian line, NG tube in stable position. Bilateral pulmonary infiltrates particularly prominent throughout the right lung again noted. No interim change. Small right pleural effusion can not be excluded. Scratched it small bilateral pleural effusions could not be excluded. No pneumothorax. IMPRESSION: 1.  Lines and tubes in stable position. 2. Bilateral pulmonary infiltrates particularly prominent throughout the right lung again noted. No significant interim change. Small bilateral pleural effusions could not be excluded. Electronically Signed   By: Marcello Moores  Register   On: 03/26/2018 06:46   Patient Profile     63 y.o. male with out of hospital ventricular fibrillation arrest  Assessment & Plan    Ventricular fibrillation cardiac arrest: The patient's heart rhythm remained stable.  Await washout of sedation to assess for neurologic recovery.  Continue supportive care.  Severe aortic valve insufficiency: Potentially work-up for treatment depending on neurologic recovery.  Elevated troponin likely secondary to demand ischemia rather than true ACS: We will stop IV heparin today and convert to DVT dose heparin  Prognosis remains guarded.  Follow neurologic status.  Will follow with you.  For questions or updates, please contact [place] Please consult [email or web address] for contact info under     Signed, Sherren Mocha, MD  03/27/2018, 8:34 AM

## 2018-03-27 NOTE — Progress Notes (Signed)
Nutrition Follow-up  DOCUMENTATION CODES:   Not applicable  INTERVENTION:   Tube Feeding:  Vital AF 1.2 at 65 ml/hr Pro-Stat 30 mL daily Provides 1972 kcals, 132 g of protein and 1269 mL of free water Meets 100% protein needs, 101% protein needs   NUTRITION DIAGNOSIS:   Inadequate oral intake related to inability to eat, acute illness(Cardiac Arrest & Vent) as evidenced by estimated needs, NPO status.  Being addressed via TF   GOAL:   Patient will meet greater than or equal to 90% of their needs  Progressing  MONITOR:   Vent status, Weight trends, Labs, TF tolerance, I & O's, Skin  REASON FOR ASSESSMENT:   New TF, Ventilator    ASSESSMENT:   Pt is a 62y M from home with PMH of kidney stones. Pt presented to ED with V.fib cardiac arrest, CPR started by wife. Hypothermia protocol started. Pt admitted for cardiac arrest.  Patient is currently intubated on ventilator support, rewarming complete, off paralytic MV: 12.1 L/min Temp (24hrs), Avg:98.3 F (36.8 C), Min:97 F (36.1 C), Max:100.4 F (38 C)  Hypoglycemic this AM   Vital AF 1.2 at 20 ml/hr; ordered to start 3/18 but not initiated until 3/19. Per RN, there was an issue obtaining feeding pump?  Tolerating trickle TF   Admission wt 75.8 kg; admission wt 70.3 kg. Net + 1.3 L  Labs: Creatinine 1.35, CBGs 66-119 Meds: D5-1/2NS at 50 ml/hr, ss novolog   Diet Order:   Diet Order    None      EDUCATION NEEDS:   Not appropriate for education at this time  Skin:  Skin Assessment: Reviewed RN Assessment  Last BM:  3/16  Height:   Ht Readings from Last 1 Encounters:  03/24/18 5\' 10"  (1.778 m)    Weight:   Wt Readings from Last 1 Encounters:  03/27/18 75.8 kg    Ideal Body Weight:  75.45 kg  BMI:  Body mass index is 23.98 kg/m.  Estimated Nutritional Needs:   Kcal:  1958 kcals   Protein:  105-140 g  Fluid:  >/= 1.9 L   Romelle Starcher MS, RD, LDN, CNSC [phone] Pager  [phone] Weekend/On-Call Pager

## 2018-03-27 NOTE — Progress Notes (Signed)
63 year old man admitted 3/17 with V. fib arrest from home.  He has a history of chronic left bundle branch block .  Wife does not report any preceding symptoms CT angiogram chest was negative for pulmonary embolic event showed consolidation both lungs and dilated ascending aorta 4.2 cm  He underwent hypothermia protocol and was rewarmed. On exam-she is starting to wake up follows one-step commands, good grip my hand and wiggle his toes, minimal secretions, clear breath sounds bilateral, soft nontender abdomen, cool extremities.  Chest x-ray personally reviewed which shows improved bilateral airspace disease  Labs show improving creatinine, normal electrolytes, no leukocytosis.  Impression/plan  V. fib arrest -has severe aortic regurgitation on echo, mildly reduced ejection fraction -will need cardiac cath -Some concern for aortic dissection on echo but CT angiogram did not show this.  Acute respiratory failure/aspiration pneumonia -Continue Unasyn -Spontaneous breathing trials with extubation when he is fully awake  AKI-recovering with good urine output  Acute encephalopathy-appears to be neurologically intact, keep off sedation with goal extubation  Updated wife and son in detail  The patient is critically ill with multiple organ systems failure and requires high complexity decision making for assessment and support, frequent evaluation and titration of therapies, application of advanced monitoring technologies and extensive interpretation of multiple databases. Critical Care Time devoted to patient care services described in this note independent of APP/resident  time is 35 minutes.   Comer Locket Vassie Loll MD

## 2018-03-27 NOTE — Progress Notes (Addendum)
Kevin Rojas for Heparin Indication: chest pain/ACS  No Known Allergies  Patient Measurements: Height: _0  (177.8 cm) Weight: 167 lb 1.7 oz (75.8 kg)(minus 1.4kg for pads ) IBW/kg (Calculated) : 73  Vital Signs: Temp: 98.1 F (36.7 C) (03/20 0700) Temp Source: Bladder (03/20 0600) BP: 110/70 (03/20 0700) Pulse Rate: 59 (03/20 0700)  Labs: Recent Labs    03/24/18 2019  03/25/18 0048  03/25/18 0252  03/25/18 0744 03/25/18 1100  03/25/18 1334  03/26/18 0250 03/26/18 0500  03/26/18 0537  03/26/18 1244  03/26/18 1616 03/26/18 1803 03/27/18 0426 03/27/18 0443  HGB 14.3   < >  --    < >  --    < > 13.4  --   --   --   --  13.0  --   --  12.6*  --   --   --   --   --  11.2* 11.6*  HCT 44.1   < >  --    < >  --    < > 41.7  --   --   --   --  40.5  --   --  37.0*  --   --   --   --   --  34.7* 34.0*  PLT 212   < >  --   --   --   --  188  --   --   --   --  168  --   --   --   --   --   --   --   --  142*  --   APTT 26  --   --   --  31  --   --  190*  --  153*  --   --   --   --   --   --   --   --   --   --   --   --   LABPROT 15.4*  --   --   --  16.4*  --   --  16.4*  --   --   --   --   --   --   --   --   --   --   --   --   --   --   INR 1.2  --   --   --  1.3*  --   --  1.3*  --   --   --   --   --   --   --   --   --   --   --   --   --   --   HEPARINUNFRC  --   --   --   --   --   --   --   --    < >  --    < >  --  0.57  --   --   --  0.35  --   --   --  0.30  --   CREATININE 1.50*  --   --   --   --   --  1.27* 1.50*  --   --    < > 1.33*  --    < >  --    < >  --    < > 1.51* 1.39* 1.35*  --   TROPONINI  --    < >  2.13*  --   --   --  4.80*  --   --  2.35*  --   --   --   --   --   --   --   --   --   --   --   --    < > = values in this interval not displayed.    Estimated Creatinine Clearance: 58.6 mL/min (A) (by C-G formula based on SCr of 1.35 mg/dL (H)).   Medical History: History reviewed. No pertinent past medical  history.  Medications:  Scheduled:  . albuterol  2.5 mg Nebulization Q6H  . artificial tears  1 application Both Eyes F7J  . chlorhexidine gluconate (MEDLINE KIT)  15 mL Mouth Rinse BID  . Chlorhexidine Gluconate Cloth  6 each Topical Daily  . feeding supplement (VITAL AF 1.2 CAL)  1,000 mL Per Tube Q24H  . insulin aspart  0-9 Units Subcutaneous Q4H  . mouth rinse  15 mL Mouth Rinse 10 times per day  . pantoprazole (PROTONIX) IV  40 mg Intravenous QHS  . potassium chloride  20 mEq Oral Daily  . sodium chloride flush  10-40 mL Intracatheter Q12H    Assessment: 63 y.o. M presents s/p cardiac arrest. Pt with increased troponin to 2.13. To begin heparin for r/o ACS. No AC PTA. CBC ok at baseline. Pt currently on hypothermia protocol with target temperature 33 degrees.  Heparin level this morning came back therapeutic at 0.3, on 700 units/hr. Hgb stable at 11.6, plt 142 (down from baseline 224). No s/sx of bleeding. No infusion issues. Started rewarming on 3/18 at [place].   Goal of Therapy:  Heparin level 0.3-0.7 units/ml Monitor platelets by anticoagulation protocol: Yes   Plan:  Increase heparin gtt to 800 units/hr Daily heparin level and CBC  Antonietta Jewel, PharmD, [place] Clinical Pharmacist  Pager: [phone] Phone: [phone] 03/27/2018 7:16 AM   ADDENDUM Has been on heparin infusion for 48 hours - discussed with cardiology and okay to discontinue and change to [facility] for DVT ppx.  Antonietta Jewel, PharmD, [place] Clinical Pharmacist

## 2018-03-27 NOTE — Progress Notes (Addendum)
CBG drawn from aline resulted at 66.  RN followed standing hypoglycemia orders and will give 25 ml dextrose and recheck in 15 min. Will report to MD during rounds.  0805 recheck CBG 119

## 2018-03-27 NOTE — Progress Notes (Signed)
NAME:  Kevin Rojas, MRN:  [id number], DOB:  [birth year], LOS: 3 ADMISSION DATE:  03/24/2018, CONSULTATION DATE:  03/24/2018 REFERRING MD:  Dr. Rosalia Hammers, CHIEF COMPLAINT:  Cardiac arrest  Brief History   22 yoM presenting from home with Vfib cardiac arrest on 3/17.  CPR started by wife.  Found in Vfib shocked twice and king airway placed.  Vomited around [facility] airway. Intubated and stable in ER but not purposeful.  Hypothermia protocol started.  EKG noted for LBBB.  PCCM to admit  Past Medical History  Kidney stones  Significant Hospital Events   3/17 Admit post VF arrest  3/18   Amiodarone discontinued due to bradycardia.  Consults:  Cards   Procedures:  3/17 ETT >> 3/17 OGT >> 3/17 R art line >> 3/17 left subclavian CVL >>  Significant Diagnostic Tests:  3/17 [facility] >> negative  3/17 CTA chest >> neg for PE, dependent consolidation in both lungs R>L, multifocal PNA, mild cardiomegaly, dilated ascending aorta to 4.2 cm   EEG 3/18 no seizures  3/18 Echo:  mildly reduced systolic function, with an ejection fraction of 45-50%. The cavity size was mildly dilated. Left ventricular diastolic Doppler parameters are consistent with pseudonormalization.RV normal systolic function, Right Atrium mildly  Dilated, trivial pericardial effusion,Trivial pericardial effusion is present. Mild thickening of the mitral valve leaflet.The aortic valveis bicuspid Aortic valve regurgitation is severe by color flow Doppler.The aortic valve appears to be a bicuspid or functionally bicuspid AV. There is severe AI.Aortic root mildly dilated, mild dilation of ascending aorta   Micro Data:  3/17 BCx 2 >> 3/17 trach asp >> 3/17 RVP >> coronavirus NL63 positive (NOT COVID)  Antimicrobials:  Ceftriaxone 3/17 x1  Azithro 3/17 >> 3/18 Unasyn 3/17 >>  Interim history/subjective:   Reached 37 degrees 3/19 at 4:30 Off pressors, sedation, following some commands, opening eyes , nods, UE movement notes ( weak) Good  urine output  Objective   Blood pressure (!) 151/77, pulse 61, temperature 98.4 F (36.9 C), temperature source Bladder, resp. rate (!) 22, height 5\' 10"  (1.778 m), weight 75.8 kg, SpO2 100 %. CVP:  [2 mmHg-7 mmHg] 7 mmHg  Vent Mode: PRVC FiO2 (%):  [40 %] 40 % Set Rate:  [22 bmp] 22 bmp Vt Set:  [580 mL] 580 mL PEEP:  [5 cmH20] 5 cmH20 Plateau Pressure:  [18 cmH20-23 cmH20] 22 cmH20   Intake/Output Summary (Last 24 hours) at 03/27/2018 0948 Last data filed at 03/27/2018 0900 Gross per 24 hour  Intake 2705.31 ml  Output 2695 ml  Net 10.31 ml   Filed Weights   03/24/18 2304 03/26/18 0600 03/27/18 0454  Weight: 72.1 kg 73.1 kg 75.8 kg   Examination:  Intubated, sedation off, follows commands , attempts to open eyes NCAT, Oral ETT, No LAD, No JVD Bilateral chest excursion, Clear breath sounds bilateral, minimal secretions, slightly diminished per bases S1-S2 , SR, , RRR, + Murmur, no gallop or rub noted Abdomen Soft nontender , ND, BS + Cool extremities, no rash or lesion , no obvious deformities Adequate UO  X-ray 3/19:bilateral infiltrates right more than left consistent with aspiration No CXR 3/10 to review   Resolved Hospital Problem list     Assessment & Plan:   Vfib cardiac arrest  - unclear etiology, no known underlying cardiac disease, no prior workup, known LBBB 12/2017 - ddx ACS/ MI, cardiac arrhythmia, electrolyte abnormality.  PE ruled out. - defib x 2, unclear specific time to ROSC - seen normal 10 mins  prior to being found, and unknown time of CPR performed by wife till EMS arrival P:  Hypothermia protocol completed 3/19 at 4:30 pm Cardiology following, cath if full recovery Amiodarone stopped due to bradycardia 3/18  Acute hypoxic respiratory failure in setting of multifocal infiltrates, viral process Coronavirus Positive (NOT COVID) Aspiration pneumonia   P:  PRVC 8cc/kg  Wean PEEP / FiO2 for sats >90% VAP prevention measures  Continue Unasyn  CXR prn ABG prn Trend micro SBT when able Trend fever and WBC Minimize sedation as able   Acute encephalopathy with concern for anoxic injury given unclear arrest  EEG negative seizures Following commands , off sedation P:  Hypothermia completed Following some commands RASS Goal: -1. to -2, minimize sedation Consider neuro consult as neuro status evolves  AKI + 1200 cc's  1600 cc UO 3/19 Creatinine down trending P:  Trend BMP / urinary output Replace electrolytes as indicated Avoid nephrotoxic agents, ensure adequate renal perfusion Monitor UO  Hyperglycemia -A1c 5.3 - Hypoglycemic event overnight 3/20, responded to 1/2 amp D50 P:  Increase TF to goal SSI, sensitive scale CBG Q4   Best practice:  Diet: TF Pain/Anxiety/Delirium protocol (if indicated): Fentanyl prn VAP protocol (if indicated): yes  DVT prophylaxis: SCD/ heparin sq for now GI prophylaxis: PPI  Glucose control: SSI sensitive Mobility: BR Code Status: Full  Family Communication: Updated wife at bedside 3/20 Disposition: ICU  Bevelyn Ngo, AGACNP-BC [facility] Pulmonary/Critical Care Medicine Pager # [phone] If no answer, call [phone]   03/27/2018, 9:48 AM

## 2018-03-28 ENCOUNTER — Inpatient Hospital Stay (HOSPITAL_COMMUNITY): Payer: BLUE CROSS/BLUE SHIELD

## 2018-03-28 DIAGNOSIS — I447 Left bundle-branch block, unspecified: Secondary | ICD-10-CM

## 2018-03-28 DIAGNOSIS — I248 Other forms of acute ischemic heart disease: Secondary | ICD-10-CM

## 2018-03-28 LAB — BASIC METABOLIC PANEL
Anion gap: 9 (ref 5–15)
BUN: 15 mg/dL (ref 8–23)
CO2: 18 mmol/L — ABNORMAL LOW (ref 22–32)
Calcium: 8.9 mg/dL (ref 8.9–10.3)
Chloride: 110 mmol/L (ref 98–111)
Creatinine, Ser: 1.23 mg/dL (ref 0.61–1.24)
GFR calc Af Amer: >60 mL/min (ref >60)
Glucose, Bld: 103 mg/dL — ABNORMAL HIGH (ref 70–99)
Potassium: 4.9 mmol/L (ref 3.5–5.1)
Sodium: 137 mmol/L (ref 135–145)

## 2018-03-28 LAB — GLUCOSE, CAPILLARY
Glucose-Capillary: 104 mg/dL — ABNORMAL HIGH (ref 70–99)
Glucose-Capillary: 85 mg/dL (ref 70–99)
Glucose-Capillary: 97 mg/dL (ref 70–99)
Glucose-Capillary: 97 mg/dL (ref 70–99)

## 2018-03-28 LAB — CBC
HCT: 40.3 % (ref 39.0–52.0)
Hemoglobin: 12.6 g/dL — ABNORMAL LOW (ref 13.0–17.0)
MCH: 26.6 pg (ref 26.0–34.0)
MCHC: 31.3 g/dL (ref 30.0–36.0)
MCV: 85.2 fL (ref 80.0–100.0)
Platelets: 188 10*3/uL (ref 150–400)
RBC: 4.73 MIL/uL (ref 4.22–5.81)
RDW: 15.1 % (ref 11.5–15.5)
WBC: 12.5 10*3/uL — ABNORMAL HIGH (ref 4.0–10.5)
nRBC: 0 % (ref 0.0–0.2)

## 2018-03-28 LAB — MAGNESIUM: Magnesium: 2 mg/dL (ref 1.7–2.4)

## 2018-03-28 LAB — PHOSPHORUS: PHOSPHORUS: 5.4 mg/dL — AB (ref 2.5–4.6)

## 2018-03-28 MED ORDER — DEXTROSE 10 % IV SOLN
INTRAVENOUS | Status: DC
  Administered 2018-03-28 – 2018-03-30 (×4): via INTRAVENOUS

## 2018-03-28 MED ORDER — DEXMEDETOMIDINE HCL IN NACL 200 MCG/50ML IV SOLN
0.4000 ug/kg/h | INTRAVENOUS | Status: DC
Start: 2018-03-28 — End: 2018-03-28
  Administered 2018-03-28: 0.4 ug/kg/h via INTRAVENOUS
  Filled 2018-03-28: qty 50

## 2018-03-28 MED ORDER — ACETAMINOPHEN 650 MG RE SUPP
650.0000 mg | Freq: Four times a day (QID) | RECTAL | Status: DC | PRN
  Administered 2018-03-28 – 2018-03-29 (×2): 650 mg via RECTAL
  Filled 2018-03-28 (×2): qty 1

## 2018-03-28 MED ORDER — FUROSEMIDE 10 MG/ML IJ SOLN
40.0000 mg | Freq: Once | INTRAMUSCULAR | Status: AC
  Administered 2018-03-28: 40 mg via INTRAVENOUS
  Filled 2018-03-28: qty 4

## 2018-03-28 MED ORDER — ORAL CARE MOUTH RINSE
15.0000 mL | Freq: Two times a day (BID) | OROMUCOSAL | Status: DC
  Administered 2018-03-28 – 2018-04-05 (×11): 15 mL via OROMUCOSAL

## 2018-03-28 MED ORDER — FENTANYL CITRATE (PF) 100 MCG/2ML IJ SOLN: 50.0000 ug | INTRAMUSCULAR | Status: DC | PRN

## 2018-03-28 MED ORDER — ALBUTEROL SULFATE (2.5 MG/3ML) 0.083% IN NEBU: 2.5000 mg | INHALATION_SOLUTION | Freq: Four times a day (QID) | RESPIRATORY_TRACT | Status: DC | PRN

## 2018-03-28 MED ORDER — ONDANSETRON HCL 4 MG/2ML IJ SOLN
4.0000 mg | Freq: Four times a day (QID) | INTRAMUSCULAR | Status: DC | PRN
  Filled 2018-03-28: qty 2

## 2018-03-28 MED ORDER — ONDANSETRON HCL 4 MG/2ML IJ SOLN
4.0000 mg | Freq: Once | INTRAMUSCULAR | Status: AC
  Administered 2018-03-28: 4 mg via INTRAVENOUS
  Filled 2018-03-28: qty 2

## 2018-03-28 MED ORDER — FENTANYL CITRATE (PF) 100 MCG/2ML IJ SOLN: 100.0000 ug | INTRAMUSCULAR | Status: DC | PRN

## 2018-03-28 MED ORDER — CHLORHEXIDINE GLUCONATE 0.12 % MT SOLN
15.0000 mL | Freq: Two times a day (BID) | OROMUCOSAL | Status: DC
  Administered 2018-03-28 – 2018-04-05 (×15): 15 mL via OROMUCOSAL
  Filled 2018-03-28 (×15): qty 15

## 2018-03-28 MED ORDER — SODIUM CHLORIDE 0.9 % IV SOLN
INTRAVENOUS | Status: DC
  Administered 2018-03-28 – 2018-03-29 (×4): via INTRAVENOUS

## 2018-03-28 MED ORDER — FENTANYL CITRATE (PF) 100 MCG/2ML IJ SOLN
100.0000 ug | INTRAMUSCULAR | Status: DC | PRN
  Administered 2018-03-28: 100 ug via INTRAVENOUS
  Filled 2018-03-28: qty 2

## 2018-03-28 NOTE — Progress Notes (Addendum)
NAME:  Kevin Rojas, MRN:  [id number], DOB:  Jan 15, 1955, LOS: 4 ADMISSION DATE:  03/24/2018, CONSULTATION DATE:  03/24/2018 REFERRING MD:  Dr. Rosalia Hammers, CHIEF COMPLAINT:  Cardiac arrest  Brief History   44 yoM presenting from home with Vfib cardiac arrest on 3/17.  CPR started by wife.  Found in Vfib shocked twice and king airway placed.  Vomited around [facility] airway. Intubated and stable in ER but not purposeful.  Hypothermia protocol started.  EKG noted for LBBB.  PCCM to admit  Past Medical History  Kidney stones  Significant Hospital Events   3/17 Admit post VF arrest  3/18 Amiodarone discontinued due to bradycardia. 3/21 Awake, following commands, PSV  Consults:  Cardiology  Procedures:  3/17 ETT >> 3/17 OGT >> 3/17 R art line >> 3/21 3/17 left subclavian CVL >>  Significant Diagnostic Tests:  3/17 [facility] >> negative  3/17 CTA chest >> neg for PE, dependent consolidation in both lungs R>L, multifocal PNA, mild cardiomegaly, dilated ascending aorta to 4.2 cm  3/18 EEG >> neg for seizure 3/18 ECHO >> LVEF 45-50%,The cavity size was mildly dilated. Left ventricular diastolic Doppler parameters are consistent with pseudonormalization.RV normal systolic function, Right Atrium mildly  Dilated, trivial pericardial effusion,Trivial pericardial effusion is present. Mild thickening of the mitral valve leaflet.The aortic valveis bicuspid Aortic valve regurgitation is severe by color flow Doppler.The aortic valve appears to be a bicuspid or functionally bicuspid AV. There is severe AI.Aortic root mildly dilated, mild dilation of ascending aorta   Micro Data:  3/17 BCx 2 >>  3/17 trach asp >> negative 3/17 RVP >> coronavirus NL63 positive (NOT COVID)  Antimicrobials:  Ceftriaxone 3/17 x1  Azithro 3/17 >> 3/18 Unasyn 3/17 >> 3/21  Interim history/subjective:  RN reports pt had several coughing episodes which led to vomiting overnight.  Afebrile.    Objective   Blood pressure (!) 149/89,  pulse (!) 105, temperature 98.1 F (36.7 C), resp. rate (!) 21, height 5\' 10"  (1.778 m), weight 73.5 kg, SpO2 99 %. CVP:  [6 mmHg-7 mmHg] 7 mmHg  Vent Mode: PRVC FiO2 (%):  [40 %] 40 % Set Rate:  [22 bmp] 22 bmp Vt Set:  [580 mL] 580 mL PEEP:  [5 cmH20] 5 cmH20 Plateau Pressure:  [16 cmH20-22 cmH20] 16 cmH20   Intake/Output Summary (Last 24 hours) at 03/28/2018 0729 Last data filed at 03/28/2018 8786 Gross per 24 hour  Intake 1852.15 ml  Output 3515 ml  Net -1662.85 ml   Filed Weights   03/26/18 0600 03/27/18 0454 03/28/18 0500  Weight: 73.1 kg 75.8 kg 73.5 kg   Examination: General: thin adult male lying in bed in NAD   HEENT: MM pink/moist, ETT Neuro: Awakens, alert, follow commands CV: s1s2 rrr, no m/r/g PULM: even/non-labored, lungs bilaterally coarse  GI: soft, non-tender, bsx4 active  Extremities: warm/dry, no edema  Skin: no rashes or lesions  Resolved Hospital Problem list     Assessment & Plan:   VFib Arrest  - unclear etiology, no known underlying cardiac disease, no prior workup, known LBBB 12/2017 - ddx ACS/ MI, cardiac arrhythmia, electrolyte abnormality.  PE ruled out. - defib x 2, unclear specific time to ROSC - seen normal 10 mins prior to being found, and unknown time of CPR performed by wife till EMS arrival P:  ICU monitoring  Cardiology following, may need LHC if full recovery.   Acute hypoxic respiratory failure in setting of multifocal infiltrates, viral process Coronavirus Positive (NOT COVID) Aspiration pneumonia  P:  PRVC 8cc/kg as rest mode PSV with goal for extubation  Follow intermittent CXR Unasyn, D5/5 Pulmonary hygiene as able  VAP prevention measures  Acute encephalopathy with concern for anoxic injury given unclear arrest  EEG negative seizures Following commands , off sedation P:  Neuro exam reassuring Follow intermittent neuro checks  Minimize sedating medications   AKI P:  Trend BMP / urinary output Replace  electrolytes as indicated Avoid nephrotoxic agents, ensure adequate renal perfusion  Hyperglycemia -A1c 5.3 - Hypoglycemic event overnight 3/20, responded to 1/2 amp D50 P:  Hold TF 3/21 with vomiting and PSV attempt  SSI, sensitive scale  D51/2 NS at 50 ml/hr while NPO CBG Q4  Best practice:  Diet: TF Pain/Anxiety/Delirium protocol (if indicated): Fentanyl prn VAP protocol (if indicated): yes  DVT prophylaxis: SCD/ heparin sq for now GI prophylaxis: PPI  Glucose control: SSI sensitive Mobility: BR Code Status: Full  Family Communication: Wife updated 3/21 am on NP rounds Disposition: ICU   CC Time: 30 minutes   Canary Brim, NP-C  Pulmonary & Critical Care Pgr: [phone] or if no answer [phone]  03/28/2018, 7:29 AM

## 2018-03-28 NOTE — Progress Notes (Signed)
Assessed patient at bedside for extubation.  Pt following commands, weaning on PSV 5/5 with Vt of 500-780.  Normal work of breathing.  Minimal FiO2.    Proceed with extubation  Discontinue cooling pads due to shivering / pt alert.  Maintain normothermia   Canary Brim, NP-C [place] Pulmonary & Critical Care Pgr: [phone] or if no answer [phone] 03/28/2018, 9:58 AM

## 2018-03-28 NOTE — Progress Notes (Addendum)
eLink Physician-Brief Progress Note Patient Name: Kevin Rojas DOB: May 07, 1955 MRN: [id number]   Date of Service  03/28/2018  HPI/Events of Note  Persistent bilious vomiting.   eICU Interventions  Will order: 1. Place OGT to LIS.  2. Abd film post OGT placement to assess position.      Intervention Category Major Interventions: Other:  Lenell Antu 03/28/2018, 10:38 PM

## 2018-03-28 NOTE — Progress Notes (Signed)
RT note: patient placed on CPAP/PSV of 5/5 at 0740.  Currently tolerating well.  Will continue to monitor.  

## 2018-03-28 NOTE — Progress Notes (Signed)
SLP Cancellation Note  Patient Details Name: Kevin Rojas MRN: [id number] DOB: 06-18-1955   Cancelled treatment:        Patient extubated at 10:50 this morning. Patient is still sedated. Will see at next available date.  Lindalou Hose Sharisse Rantz, MA, CCC-SLP 03/28/2018 12:28 PM

## 2018-03-28 NOTE — Plan of Care (Signed)
  Problem: Clinical Measurements: Goal: Ability to maintain clinical measurements within normal limits will improve Outcome: Progressing Goal: Diagnostic test results will improve Outcome: Progressing Goal: Respiratory complications will improve Outcome: Progressing Goal: Cardiovascular complication will be avoided Outcome: Progressing   Problem: Elimination: Goal: Will not experience complications related to urinary retention Outcome: Progressing   Problem: Pain Managment: Goal: General experience of comfort will improve Outcome: Progressing   Problem: Safety: Goal: Ability to remain free from injury will improve Outcome: Progressing   Problem: Skin Integrity: Goal: Risk for impaired skin integrity will decrease Outcome: Progressing   Problem: Cardiac: Goal: Ability to achieve and maintain adequate cardiopulmonary perfusion will improve Outcome: Progressing   Problem: Neurologic: Goal: Promote progressive neurologic recovery Outcome: Progressing   Problem: Skin Integrity: Goal: Risk for impaired skin integrity will be minimized. Outcome: Progressing   Problem: Clinical Measurements: Goal: Will remain free from infection Outcome: Not Progressing Note:  Patient with elevated temp post therapeutic hypothermia. Patient also noted with abnormal chest x-ray raising concern for infection and elevated WBC count.    Problem: Elimination: Goal: Will not experience complications related to bowel motility Outcome: Not Progressing Note:  Patient with multiple episodes of vomiting with emesis noted with undigested food from food patient consumed Tuesday prior to cardiac arrest. Wife at bedside confirms patient meal prior to arrest. Concern for impaired bowel motility due to undigested food and vomiting.

## 2018-03-28 NOTE — Progress Notes (Signed)
63 year old man admitted 3/17 with V. fib arrest from home. He has a history of chronic left bundle branch block .Wife does not report any preceding symptoms CT angiogram chest was negative for pulmonary embolic event showed consolidation both lungs and dilated ascending aorta 4.2 cm  He underwent hypothermia protocol and was rewarmed. He had episodes of vomiting overnight.  On exam-still appears weak and deconditioned, follows one-step commands, grip my hand and moved his toes, weak cough, decreased breath sounds bilateral, soft nontender abdomen, warm extremities.  Chest x-ray personally reviewed which shows persistent bilateral airspace disease. Labs reviewed which show stable creatinine, normal electrolytes, mild leukocytosis.  Impression/plan  Acute respiratory failure-he was weaned on pressure support and extubated to nasal cannula  V. fib arrest-severe aortic regurgitatio on echo - will need cardiac cath and further evaluation by cardiology  Aspiration pneumonia-continue Unasyn, Zofran as needed for vomiting.  AKI -recovering with good cardiac output, he appears volume loaded, received Lasix this morning, will give additional dose in the evening  While he is n.p.o., will use D10 drip at 40 an hour to prevent hypoglycemia. Acute encephalopathy appears to be resolving, postarrest but he appears to be neurologically intact. PT consult.  Prognosis improved, will ask triad to take over 322  The patient is critically ill with multiple organ systems failure and requires high complexity decision making for assessment and support, frequent evaluation and titration of therapies, application of advanced monitoring technologies and extensive interpretation of multiple databases. Critical Care Time devoted to patient care services described in this note independent of APP/resident  time is 35 minutes.   Comer Locket Vassie Loll MD

## 2018-03-28 NOTE — Progress Notes (Signed)
PT Cancellation Note  Patient Details Name: Trayon Kania MRN: [id number] DOB: [birth year]   Cancelled Treatment:    Reason Eval/Treat Not Completed: Patient not medically ready. Pt extubated at 1050 today. Pt is now sedated and sleeping. PT to re-attempt tomorrow.    Ilda Foil 03/28/2018, 12:46 PM   Aida Raider, PT  Office # [phone] Pager [phone]

## 2018-03-28 NOTE — Procedures (Signed)
Extubation Procedure Note  Patient Details:   Name: Kevin Rojas DOB: 06/07/1955 MRN: [id number]   Airway Documentation:    Vent end date: 03/28/18 Vent end time: 1050   Evaluation  O2 sats: stable throughout Complications: No apparent complications Patient did tolerate procedure well. Bilateral Breath Sounds: Clear   Yes   Patient extubated to 4L nasal cannula per MD order.  Positive cuff leak noted.  No evidence of stridor.  Patient able to speak post extubation.  Sats currently 98%.  Vitals are stable.  After removing ETT patient began to vomit which was suctioned out by RN.  At this time, no complications noted.   [name] 03/28/2018, 10:55 AM

## 2018-03-28 NOTE — Progress Notes (Signed)
Progress Note  Patient Name: Kevin Rojas Date of Encounter: 03/28/2018  Primary Cardiologist: Tonny Bollman, MD   Subjective   Sedate, resting comfortably in bed.  He is a few hours post extubation.  Inpatient Medications    Scheduled Meds: . albuterol  2.5 mg Nebulization Q6H  . Chlorhexidine Gluconate Cloth  6 each Topical Daily  . heparin injection (subcutaneous)  5,000 Units Subcutaneous Q8H  . insulin aspart  0-9 Units Subcutaneous Q4H  . pantoprazole (PROTONIX) IV  40 mg Intravenous QHS  . potassium chloride  20 mEq Oral Daily  . sodium chloride flush  10-40 mL Intracatheter Q12H   Continuous Infusions: . sodium chloride 10 mL/hr at 03/28/18 1100  . ampicillin-sulbactam (UNASYN) IV Stopped (03/28/18 1010)  . dextrose 5 % and 0.45% NaCl 50 mL/hr at 03/28/18 1100   PRN Meds: acetaminophen, hydrALAZINE, ondansetron (ZOFRAN) IV, sodium chloride flush   Vital Signs    Vitals:   03/28/18 1030 03/28/18 1054 03/28/18 1100 03/28/18 1130  BP: 122/68  (!) 143/81 128/66  Pulse: (!) 107 (!) 123 (!) 122 (!) 106  Resp: (!) 21 (!) 32 (!) 22 (!) 25  Temp:   98.4 F (36.9 C) 98.4 F (36.9 C)  TempSrc:      SpO2: 99% 98% 95% 98%  Weight:      Height:        Intake/Output Summary (Last 24 hours) at 03/28/2018 1218 Last data filed at 03/28/2018 1200 Gross per 24 hour  Intake 1780.87 ml  Output 3145 ml  Net -1364.13 ml   Last 3 Weights 03/28/2018 03/27/2018 03/26/2018  Weight (lbs) 162 lb 0.6 oz 167 lb 1.7 oz 161 lb 2.5 oz  Weight (kg) 73.5 kg 75.8 kg 73.1 kg      Telemetry    Sinus rhythm/sinus tachycardia, left bundle branch block, 1, 3 beat triplet noted- Personally Reviewed  ECG    Sinus bradycardia left bundle branch block- Personally Reviewed  Physical Exam   GEN:  Comfortable, sedated in bed Cardiac:  Currently sinus rhythm on telemetry Respiratory:  Normal respiratory effort noted. GI: non-distended  MS: No deformity. Neuro:   Sleeping Psych:  Sleeping  Labs    Chemistry Recent Labs  Lab 03/24/18 [id number]  03/26/18 1803 03/27/18 0426 03/27/18 0443 03/28/18 0443  NA  --    < > 143 141 144 137  K  --    < > 4.5 4.1 4.2 4.9  CL  --    < > 122* 119*  --  110  CO2  --    < > 17* 16*  --  18*  GLUCOSE  --    < > 104* 115*  --  103*  BUN  --    < > 10 10  --  15  CREATININE  --    < > 1.39* 1.35*  --  1.23  CALCIUM  --    < > 7.9* 8.0*  --  8.9  PROT 6.7  --   --   --   --   --   ALBUMIN 3.4*  --   --   --   --   --   AST 250*  --   --   --   --   --   ALT 232*  --   --   --   --   --   ALKPHOS 68  --   --   --   --   --  BILITOT 1.1  --   --   --   --   --   GFRNONAA  --    < > 54* 56*  --  >60  GFRAA  --    < > >60 >60  --  >60  ANIONGAP  --    < > 4* 6  --  9   < > = values in this interval not displayed.     Hematology Recent Labs  Lab 03/26/18 0250  03/27/18 0426 03/27/18 0443 03/28/18 0443  WBC 16.2*  --  9.7  --  12.5*  RBC 4.85  --  4.18*  --  4.73  HGB 13.0   < > 11.2* 11.6* 12.6*  HCT 40.5   < > 34.7* 34.0* 40.3  MCV 83.5  --  83.0  --  85.2  MCH 26.8  --  26.8  --  26.6  MCHC 32.1  --  32.3  --  31.3  RDW 14.2  --  14.7  --  15.1  PLT 168  --  142*  --  188   < > = values in this interval not displayed.    Cardiac Enzymes Recent Labs  Lab 03/24/18 2058 03/25/18 0048 03/25/18 0744 03/25/18 1334  TROPONINI 0.04* 2.13* 4.80* 2.35*    Recent Labs  Lab 03/24/18 2112  TROPIPOC 0.11*     BNPNo results for input(s): BNP, PROBNP in the last 168 hours.   DDimer No results for input(s): DDIMER in the last 168 hours.   Radiology    Dg Chest Port 1 View  Result Date: 03/28/2018 CLINICAL DATA:  Initial evaluation for acute dyspnea. EXAM: PORTABLE CHEST 1 VIEW COMPARISON:  Prior radiograph from 03/26/2018. FINDINGS: Endotracheal tube in place with tip positioned 3.2 cm above the carina. Left subclavian approach CVC in place with tip overlying the distal SVC, stable. Enteric tube courses into the  abdomen. Stable cardiomegaly. Mediastinal silhouette within normal limits. Lungs hypoinflated. Veiling opacity overlying the hemidiaphragms compatible with layering effusions, right greater than left. Bilateral parenchymal infiltrates again seen involving the lungs bilaterally, right greater than left, perhaps mildly improved at the right upper lung as compared to previous. Underlying mild diffuse pulmonary interstitial congestion. No pneumothorax. Osseous structures unchanged. IMPRESSION: 1. Support apparatus in satisfactory position as above. 2. Veiling opacities overlying the hemidiaphragms, compatible with layering pleural effusions, right worse than left. These appear slightly worsened from previous. 3. Persistent right greater than left parenchymal infiltrates, perhaps mildly improved at the right upper lobe as compared to previous. Electronically Signed   By: Rise Mu M.D.   On: 03/28/2018 05:05    Cardiac Studies   Echocardiogram 03/25/2018 EF 40 to 45% Bicuspid aortic valve, severe regurgitation  Patient Profile     63 y.o. male post V. fib arrest, now extubated  Assessment & Plan    V. fib arrest - On telemetry, sinus tachycardia with interventricular conduction delay noted.  He did have one ventricular triplet noted. - I agree with prior cardiology notes that we will continue to monitor his overall mental status to determine further testing, cardiac catheterization for instance. - Patient was at home in usual state of health sitting on the couch when his wife returned to find him after 10 minutes on the phone slumped over unresponsive.  No known history of CAD.  He is a Programmer, multimedia.  Hospital doctor for living.  Mildly reduced systolic function -EF 40 to 45%, postarrest.  Unknown chronicity.  Bicuspid  aortic valve with severe aortic valve regurgitation - We will have to assess for further encephalopathic improvements before pursuing potential cardiac surgery.   Left bundle branch block - Noted back in December.  Elevated troponin - Peak at 4.8 decreased to 2.35.  Likely secondary to demand ischemia in the setting of his arrest/hypotension/respiratory failure.  This does portend a worsened prognosis.  Dilated aortic root -4.4 cm on CT scan.  Nonsurgical, continue treat blood pressure.  Likely related to bicuspid aortic valve. -Note there was no evidence of coronary artery calcification  No other changes from our aspect.     For questions or updates, please contact [facility] Please consult [email or web address] for contact info under        Signed, Donato Schultz, MD  03/28/2018, 12:18 PM

## 2018-03-28 NOTE — Progress Notes (Signed)
Discussed central line removal with NP Veleta Miners, NP advised to keep the central line at this time as patient just extubated and line will be evaluated for necessity on 3/22.

## 2018-03-28 NOTE — Progress Notes (Signed)
eLink Physician-Brief Progress Note Patient Name: Kevin Rojas DOB: 1955/07/05 MRN: [id number]   Date of Service  03/28/2018  HPI/Events of Note  Patient with increasing work of breathing on vent.  cxr with edema and right effusion  eICU Interventions  Lasix Fentanyl     Intervention Category Minor Interventions: Agitation / anxiety - evaluation and management  Henry Russel, P 03/28/2018, 4:54 AM

## 2018-03-29 ENCOUNTER — Inpatient Hospital Stay (HOSPITAL_COMMUNITY): Payer: BLUE CROSS/BLUE SHIELD

## 2018-03-29 LAB — CBC
HCT: 38.8 % — ABNORMAL LOW (ref 39.0–52.0)
Hemoglobin: 12.8 g/dL — ABNORMAL LOW (ref 13.0–17.0)
MCH: 27.8 pg (ref 26.0–34.0)
MCHC: 33 g/dL (ref 30.0–36.0)
MCV: 84.2 fL (ref 80.0–100.0)
Platelets: 215 10*3/uL (ref 150–400)
RBC: 4.61 MIL/uL (ref 4.22–5.81)
RDW: 14.8 % (ref 11.5–15.5)
WBC: 17.2 10*3/uL — ABNORMAL HIGH (ref 4.0–10.5)
nRBC: 0 % (ref 0.0–0.2)

## 2018-03-29 LAB — GLUCOSE, CAPILLARY
Glucose-Capillary: 104 mg/dL — ABNORMAL HIGH (ref 70–99)
Glucose-Capillary: 108 mg/dL — ABNORMAL HIGH (ref 70–99)
Glucose-Capillary: 115 mg/dL — ABNORMAL HIGH (ref 70–99)
Glucose-Capillary: 122 mg/dL — ABNORMAL HIGH (ref 70–99)
Glucose-Capillary: 128 mg/dL — ABNORMAL HIGH (ref 70–99)

## 2018-03-29 LAB — BASIC METABOLIC PANEL
Anion gap: 10 (ref 5–15)
BUN: 24 mg/dL — ABNORMAL HIGH (ref 8–23)
CALCIUM: 8.9 mg/dL (ref 8.9–10.3)
CO2: 24 mmol/L (ref 22–32)
Chloride: 104 mmol/L (ref 98–111)
Creatinine, Ser: 1.46 mg/dL — ABNORMAL HIGH (ref 0.61–1.24)
GFR calc Af Amer: 59 mL/min — ABNORMAL LOW (ref >60)
GFR calc non Af Amer: 51 mL/min — ABNORMAL LOW (ref >60)
Glucose, Bld: 106 mg/dL — ABNORMAL HIGH (ref 70–99)
Potassium: 4.2 mmol/L (ref 3.5–5.1)
Sodium: 138 mmol/L (ref 135–145)

## 2018-03-29 MED ORDER — ACETAMINOPHEN 325 MG PO TABS
650.0000 mg | ORAL_TABLET | Freq: Four times a day (QID) | ORAL | Status: DC | PRN
  Administered 2018-03-29: 650 mg via ORAL
  Filled 2018-03-29: qty 2

## 2018-03-29 MED ORDER — SODIUM CHLORIDE 0.9 % IV SOLN
3.0000 g | Freq: Four times a day (QID) | INTRAVENOUS | Status: AC
  Administered 2018-03-29 – 2018-03-30 (×6): 3 g via INTRAVENOUS
  Filled 2018-03-29 (×8): qty 3

## 2018-03-29 NOTE — Progress Notes (Signed)
End of Shift Summary:   Patient noted with increase sensitivity with gag reflex. Deep oral suctioning caused patient to gag and vomit up tube feedings. Residuals assessed post vomiting and patient only noted with tube feeding residuals in stomach. Oral care completed and patient vomited again. Tube feedings stopped and on call MD notified and updated. Order received for PRN Zofran and as nursing staff attempted to input order for medication, patient vomited again without stimulus. Zofran administered as ordered. Emesis noted with undigested food from second and third occurrence. These vomiting occurrences happened prior to midnight. Patient noted to vomit a fourth time this morning with the 0400 scheduled mouth care. After midnight patient noted to have issues with double stacking breaths on ventilator and consistent alarm for regulation pressure limited. Attempt made to manage patients comfort/sedation to improve vent synchrony. Endotrachial suctioning attempted but nursing staff was able to pass inline suction catheter more than 1-2 inches before meeting resistance. Respiratory therapy notified and updated. Respiratory therapy was able to get suction catheter to pass and ET suctioning completed. Further issues with regulation pressure limited alarm unable to be corrected by nursing staff or respiratory therapy. On call MD notified multiple times seeking assistance for alarm resolution and prevent double stacking breathins. Interventions attempted with trying to manage patients sedation to promote vent synchrony, CPAP trial, and suctioning. Lastly another respiratory therapist assisted with changing the vent filters with concern that a clogged filter may be causing the alarm. Second respiratory therapist attempted endotracheal suctioning and noted inline suction catheter would not insert all the way raising concern for mucus plug. Critical care ground team almost consulted for assistance with  management/maintenance of ET tube with possible mucus plug but respiratory therapy was finally able to fully advance suction catheter past the occlusion; however no secretions were able to be suctioned out. Ventilator alarm resolved and patient no longer double stacking breaths after getting suction catheter past the occulusion. On call MD monitoring at bedside via E-Link at time of second therapists interventions. MD able to visualize patient and ventilator monitor/settings via E-Link. Sedation medications stopped after resolution of vent alarm with hope to re-attempt spontaneous breathing trials later today. Wife at bedside throughout the night while nursing staff managed patient care and vomiting/mechanical ventilator issues. Wife updated throughout the night.  Vitals and oxygen saturations remain stable despite multiple episodes of vomiting and double stacking breaths / regulation pressure limited alarms. No signs of respiratory distress noted. Patient able to rest comfortable after settling down from vomiting episodes.

## 2018-03-29 NOTE — Progress Notes (Signed)
RN discussed removal of central line with MD Ogbata during morning rounds.  MD Ogbata advised it is okay to remove central line if cardiology also approved of removal.  RN spoke with MD Anne Fu and MD Anne Fu advised okay to remove central line.

## 2018-03-29 NOTE — Progress Notes (Signed)
Progress Note  Patient Name: Kevin Rojas Date of Encounter: 03/29/2018  Primary Cardiologist: Tonny Bollman, MD   Subjective   Encephalopathy seems to be slowly improving.  When oriented, he still is asking questions on why he is here even after being recently explained.  Slowly moving all extremities.  Had copious bilious vomiting last night.  Mild fever.  NG tube placed.  Extubated yesterday morning.  Inpatient Medications    Scheduled Meds:  chlorhexidine  15 mL Mouth Rinse BID   heparin injection (subcutaneous)  5,000 Units Subcutaneous Q8H   insulin aspart  0-9 Units Subcutaneous Q4H   mouth rinse  15 mL Mouth Rinse q12n4p   pantoprazole (PROTONIX) IV  40 mg Intravenous QHS   potassium chloride  20 mEq Oral Daily   sodium chloride flush  10-40 mL Intracatheter Q12H   Continuous Infusions:  sodium chloride Stopped (03/29/18 0140)   dextrose 40 mL/hr at 03/29/18 0600   PRN Meds: acetaminophen, albuterol, hydrALAZINE, ondansetron (ZOFRAN) IV, sodium chloride flush   Vital Signs    Vitals:   03/29/18 0530 03/29/18 0600 03/29/18 0700 03/29/18 0759  BP: (!) 148/86 (!) 154/96 (!) 145/81   Pulse: 78 92 95   Resp: 18 (!) 23 (!) 21   Temp: 100 F (37.8 C) 100 F (37.8 C) 99.9 F (37.7 C) 99.9 F (37.7 C)  TempSrc:    Core  SpO2: 97% 95% 96%   Weight:      Height:        Intake/Output Summary (Last 24 hours) at 03/29/2018 0902 Last data filed at 03/29/2018 0600 Gross per 24 hour  Intake 1169.17 ml  Output 2985 ml  Net -1815.83 ml   Last 3 Weights 03/28/2018 03/27/2018 03/26/2018  Weight (lbs) 162 lb 0.6 oz 167 lb 1.7 oz 161 lb 2.5 oz  Weight (kg) 73.5 kg 75.8 kg 73.1 kg      Telemetry    Sinus rhythm interventricular conduction delay, occasional sinus tachycardia.  No VT.- Personally Reviewed  ECG    No new- Personally Reviewed  Physical Exam   GEN: No acute distress, in bed, sleeping.   Cardiac: Telemetry reviewed sinus rhythm  interventricular conduction delay Respiratory:  Normal respiratory effort. GI: non-distended  MS: No deformity. Neuro:  Nonfocal  Psych: Still demonstrating signs of encephalopathy but slowly improving  Labs    Chemistry Recent Labs  Lab 03/24/18 2058  03/27/18 0426 03/27/18 0443 03/28/18 0443 03/29/18 0523  NA  --    < > 141 144 137 138  K  --    < > 4.1 4.2 4.9 4.2  CL  --    < > 119*  --  110 104  CO2  --    < > 16*  --  18* 24  GLUCOSE  --    < > 115*  --  103* 106*  BUN  --    < > 10  --  15 24*  CREATININE  --    < > 1.35*  --  1.23 1.46*  CALCIUM  --    < > 8.0*  --  8.9 8.9  PROT 6.7  --   --   --   --   --   ALBUMIN 3.4*  --   --   --   --   --   AST 250*  --   --   --   --   --   ALT 232*  --   --   --   --   --  ALKPHOS 68  --   --   --   --   --   BILITOT 1.1  --   --   --   --   --   GFRNONAA  --    < > 56*  --  >60 51*  GFRAA  --    < > >60  --  >60 59*  ANIONGAP  --    < > 6  --  9 10   < > = values in this interval not displayed.     Hematology Recent Labs  Lab 03/27/18 0426 03/27/18 0443 03/28/18 0443 03/29/18 0523  WBC 9.7  --  12.5* 17.2*  RBC 4.18*  --  4.73 4.61  HGB 11.2* 11.6* 12.6* 12.8*  HCT 34.7* 34.0* 40.3 38.8*  MCV 83.0  --  85.2 84.2  MCH 26.8  --  26.6 27.8  MCHC 32.3  --  31.3 33.0  RDW 14.7  --  15.1 14.8  PLT 142*  --  188 215    Cardiac Enzymes Recent Labs  Lab 03/24/18 2058 03/25/18 0048 03/25/18 0744 03/25/18 1334  TROPONINI 0.04* 2.13* 4.80* 2.35*    Recent Labs  Lab 03/24/18 2112  TROPIPOC 0.11*     BNPNo results for input(s): BNP, PROBNP in the last 168 hours.   DDimer No results for input(s): DDIMER in the last 168 hours.   Radiology    Dg Chest Port 1 View  Result Date: 03/28/2018 CLINICAL DATA:  Initial evaluation for acute dyspnea. EXAM: PORTABLE CHEST 1 VIEW COMPARISON:  Prior radiograph from 03/26/2018. FINDINGS: Endotracheal tube in place with tip positioned 3.2 cm above the carina. Left  subclavian approach CVC in place with tip overlying the distal SVC, stable. Enteric tube courses into the abdomen. Stable cardiomegaly. Mediastinal silhouette within normal limits. Lungs hypoinflated. Veiling opacity overlying the hemidiaphragms compatible with layering effusions, right greater than left. Bilateral parenchymal infiltrates again seen involving the lungs bilaterally, right greater than left, perhaps mildly improved at the right upper lung as compared to previous. Underlying mild diffuse pulmonary interstitial congestion. No pneumothorax. Osseous structures unchanged. IMPRESSION: 1. Support apparatus in satisfactory position as above. 2. Veiling opacities overlying the hemidiaphragms, compatible with layering pleural effusions, right worse than left. These appear slightly worsened from previous. 3. Persistent right greater than left parenchymal infiltrates, perhaps mildly improved at the right upper lobe as compared to previous. Electronically Signed   By: Rise Mu M.D.   On: 03/28/2018 05:05   Dg Abd Portable 1v  Result Date: 03/29/2018 CLINICAL DATA:  Nasogastric tube placement EXAM: PORTABLE ABDOMEN - 1 VIEW COMPARISON:  Portable exam 0048 hours compared to CT abdomen and pelvis of 12/31/2017 FINDINGS: Nasogastric tube coiled in mid stomach with tip projecting over distal antrum. Air-filled loops of large and small bowel in mid abdomen with scattered stool in colon. Catheter versus thermal probe coiled in pelvis. LEFT lower lobe consolidation. Bones unremarkable. IMPRESSION: Nasogastric tube coiled in mid stomach with tip projecting over distal gastric antrum. Electronically Signed   By: Ulyses Southward M.D.   On: 03/29/2018 01:09    Cardiac Studies   Echocardiogram 03/25/2018-EF 40 to 45% with bicuspid aortic valve and severe regurgitation  Patient Profile     63 y.o. male with ventricular fibrillatory arrest at home, in normal state of health no prior cardiac history sitting on  couch wife went out for about 10 minutes came back found him unconscious.  CPR.  Assessment & Plan  Ventricular fibrillatory arrest - His hypoxic encephalopathy seems to be slowly improving although he is not at a point where further invasive testing can be done.  Continue to monitor his progress. -EF 45%   Severe aortic valve regurgitation bicuspid aortic valve - At this juncture, need to continue to monitor for improvement before considering possible operative candidacy. -No overt signs of heart failure currently.  Breathing appears comfortable. - It is often helpful in severe regurgitation of the aortic valve to maintain an increased heart rate, because of this, low-dose beta-blocker likely should only be used.  At this time holding off of beta-blocker initiation until he is able to take oral successfully.  Mildly reduced systolic function - EF was 40 to 45% post arrest.  Unknown chronicity of this.  Interventricular conduction delay noted.  Left bundle branch block - Chronic, noted back in December  Elevated troponin -Likely demand ischemia in the setting of his cardiac arrest, rise and fall troponin noted.  4.8 at peak, decreased to 2.35.  This portends a worsened prognosis.  Dilated aortic root - 4.4 cm on CT scan- likely related to bicuspid aortic valve.  Nonsurgical at this point.  Bilious vomiting/fever-peak temperature 101.8 - NG tube in place per primary team.  Appreciate their overall guidance. -Mild increase in creatinine noted.  Encephalopathy -Slowly progressing, physical therapy and Occupational Therapy to assess.      For questions or updates, please contact [facility] Please consult [email or web address] for contact info under        Signed, Donato Schultz, MD  03/29/2018, 9:02 AM

## 2018-03-29 NOTE — Evaluation (Signed)
Physical Therapy Evaluation Patient Details Name: Kevin Rojas MRN: [id number] DOB: 01/18/1955 Today's Date: 03/29/2018   History of Present Illness  Pt is a 63 year old man admitted 3/17 with V fib arrest, + CPR. ETT 3/17-3/21. Hospital course complicated by ongoing vomiting, NGT placed 3/21. PMH: chronic L bundle branch block.   Clinical Impression  Orders received for PT evaluation. Patient demonstrates deficits in functional mobility as indicated below. Will benefit from continued skilled PT to address deficits and maximize function. Will see as indicated and progress as tolerated.  Patient was highly independent prior to admission, currently patient requires increased assist for all aspects of mobility. Given current deficits, good family support and PLOF, feel patient would be ideal candidate for CIR.    Follow Up Recommendations CIR    Equipment Recommendations  (TBD)    Recommendations for Other Services Rehab consult     Precautions / Restrictions Precautions Precautions: Fall Precaution Comments: NGT Restrictions Weight Bearing Restrictions: No      Mobility  Bed Mobility Overal bed mobility: Needs Assistance Bed Mobility: Supine to Sit;Sit to Supine     Supine to sit: Min assist Sit to supine: Mod assist   General bed mobility comments: increased time, min assist to position hips at EOB, assist for LEs back into bed   Transfers Overall transfer level: Needs assistance Equipment used: 2 person hand held assist Transfers: Sit to/from Stand Sit to Stand: +2 physical assistance;Min assist         General transfer comment: assist to rise and stedy, pt able to take several side steps toward HOB, flexed posture  Ambulation/Gait Ambulation/Gait assistance: Min assist;+2 physical assistance Gait Distance (Feet): 3 Feet         General Gait Details: side steps to [facility]  Stairs            Wheelchair Mobility    Modified Rankin (Stroke Patients  Only)       Balance Overall balance assessment: Needs assistance   Sitting balance-Leahy Scale: Fair     Standing balance support: Bilateral upper extremity supported Standing balance-Leahy Scale: Poor                               Pertinent Vitals/Pain Pain Assessment: Faces Faces Pain Scale: No hurt    Home Living Family/patient expects to be discharged to:: Private residence Living Arrangements: Spouse/significant other Available Help at Discharge: Family;Available 24 hours/day Type of Home: House Home Access: Stairs to enter Entrance Stairs-Rails: Right Entrance Stairs-Number of Steps: 5+2 Home Layout: One level Home Equipment: None Additional Comments: has access to a walker    Prior Function Level of Independence: Independent         Comments: drives, concrete truck driver     Hand Dominance   Dominant Hand: Right    Extremity/Trunk Assessment   Upper Extremity Assessment Upper Extremity Assessment: Generalized weakness    Lower Extremity Assessment Lower Extremity Assessment: Generalized weakness    Cervical / Trunk Assessment Cervical / Trunk Assessment: Other exceptions Cervical / Trunk Exceptions: weakness, forward head  Communication   Communication: No difficulties;Other (comment)(minimal verbalization)  Cognition Arousal/Alertness: Awake/alert Behavior During Therapy: Flat affect Overall Cognitive Status: Within Functional Limits for tasks assessed                                 General Comments: WFL for tasks  assessed this visit, needs ongoing assessment, minimally verbal during session      General Comments      Exercises     Assessment/Plan    PT Assessment Patient needs continued PT services  PT Problem List Decreased strength;Decreased activity tolerance;Decreased balance;Decreased mobility;Cardiopulmonary status limiting activity       PT Treatment Interventions DME instruction;Gait  training;Stair training;Functional mobility training;Therapeutic activities;Therapeutic exercise;Balance training;Neuromuscular re-education;Patient/family education    PT Goals (Current goals can be found in the Care Plan section)  Acute Rehab PT Goals Patient Stated Goal: to return to PLOF PT Goal Formulation: With patient/family Time For Goal Achievement: 04/12/18 Potential to Achieve Goals: Good    Frequency Min 3X/week   Barriers to discharge        Co-evaluation PT/OT/SLP Co-Evaluation/Treatment: Yes Reason for Co-Treatment: For patient/therapist safety;Complexity of the patient's impairments (multi-system involvement) PT goals addressed during session: Mobility/safety with mobility;Balance OT goals addressed during session: ADL's and self-care       AM-PAC PT "6 Clicks" Mobility  Outcome Measure Help needed turning from your back to your side while in a flat bed without using bedrails?: A Little Help needed moving from lying on your back to sitting on the side of a flat bed without using bedrails?: A Little Help needed moving to and from a bed to a chair (including a wheelchair)?: A Little Help needed standing up from a chair using your arms (e.g., wheelchair or bedside chair)?: A Little Help needed to walk in hospital room?: A Lot Help needed climbing 3-5 steps with a railing? : Total 6 Click Score: 15    End of Session Equipment Utilized During Treatment: Oxygen Activity Tolerance: Patient tolerated treatment well Patient left: in bed;with call bell/phone within reach;with family/visitor present Nurse Communication: Mobility status PT Visit Diagnosis: Unsteadiness on feet (R26.81);Difficulty in walking, not elsewhere classified (R26.2);Muscle weakness (generalized) (M62.81)    Time: 2774-1287 PT Time Calculation (min) (ACUTE ONLY): 18 min   Charges:   PT Evaluation $PT Eval Moderate Complexity: 1 Mod          Charlotte Crumb, PT DPT  Board Certified  Neurologic Specialist Acute Rehabilitation Services Pager [phone] Office [phone]   Fabio Asa 03/29/2018, 1:53 PM

## 2018-03-29 NOTE — Progress Notes (Signed)
Rehab Admissions Coordinator Note:  Patient was screened by Trish Mage for appropriateness for an Inpatient Acute Rehab Consult.  At this time, we are recommending Inpatient Rehab consult.  Trish Mage 03/29/2018, 6:35 PM  I can be reached at [phone].

## 2018-03-29 NOTE — Progress Notes (Signed)
PROGRESS NOTE    Kevin Rojas  OKH:[id number] DOB: [birth year] DOA: 03/24/2018 PCP: [name], MD  Outpatient Specialists:   Brief Narrative:  Patient is a 63 year old African-American male, with past medical history significant for nephrolithiasis.  Patient presented from home with Vfib cardiac arrest on 3/17.  CPR started by wife.  Found in Vfib shocked twice and king airway placed.  Vomited around [facility] airway. Intubated and stable in ER but not purposeful.  Hypothermia protocol started.  EKG noted for LBBB.  PCCM the patient.  Patient has been extubated.  Patient was transferred to the hospitalist team today, and 03/29/2018.  Problems include encephalopathy that is resolving lactate following the V. fib arrest, possible aspiration pneumonia, decreased ejection fraction that could be related to the V. fib arrest and cardioversion, acute kidney injury that is resolving, likely bicuspid aortic valve with severe regurgitation and elevated troponin likely secondary to above.    Assessment & Plan:   Principal Problem:   Cardiac arrest [facility]) Active Problems:   LBBB (left bundle branch block)   Elevated troponin   Hypokalemia   Endotracheally intubated   Abnormal CXR   Aspiration pneumonia (HCC)   Severe aortic insufficiency   Acute respiratory failure with hypoxia (HCC)   Aspiration into airway   V. fib arrest/bicuspid aortic valve with severe regurgitation: Cardiology input is appreciated. For likely cardiac catheterization chronic inflammation, will defer to cardiology team. Encephalopathy is resolving. Further management will depend on hospital course.  Possible aspiration pneumonia: Continue Unasyn. Leukocytosis persists. Chest x-ray finding noted (bilateral airspace disease).  Acute kidney injury: Likely related to V. fib arrest. Continue to monitor.  Encephalopathy: This is following V. fib arrest. Patient is improving. Manage expectantly.   DVT prophylaxis:  Subcu heparin Code Status: Full code Family Communication: Wife Disposition Plan: Will depend on hospital course   Consultants:   Cardiology  Patient was under PCCM  Procedures:   Cardiogram reveals EF of 45%.  Antimicrobials:   IV Unasyn   Subjective: History from patient due to encephalopathy.  Objective: Vitals:   03/29/18 1200 03/29/18 1300 03/29/18 1400 03/29/18 1430  BP: (!) 142/88 (!) 151/86 (!) 144/109 (!) 148/84  Pulse: 83 77 88 85  Resp: (!) 24 (!) 24 (!) 24 15  Temp: 99.9 F (37.7 C) 100 F (37.8 C) 100 F (37.8 C) 100.2 F (37.9 C)  TempSrc: Core     SpO2: 95% 95% 97% 97%  Weight:      Height:        Intake/Output Summary (Last 24 hours) at 03/29/2018 1455 Last data filed at 03/29/2018 1423 Gross per 24 hour  Intake 1073.87 ml  Output 3570 ml  Net -2496.13 ml   Filed Weights   03/26/18 0600 03/27/18 0454 03/28/18 0500  Weight: 73.1 kg 75.8 kg 73.5 kg    Examination:  General exam: Appears calm and comfortable.   Respiratory system: Clear to auscultation.  Cardiovascular system: S1 & S2 heard Gastrointestinal system: Abdomen is nondistended, soft and nontender. No organomegaly or masses felt. Normal bowel sounds heard. Central nervous system: Awake.  Follows simple command.   Data Reviewed: I have personally reviewed following labs and imaging studies  CBC: Recent Labs  Lab 03/24/18 2130  03/25/18 0744 03/26/18 0250 03/26/18 0537 03/27/18 0426 03/27/18 0443 03/28/18 0443 03/29/18 0523  WBC 10.5  --  12.8* 16.2*  --  9.7  --  12.5* 17.2*  NEUTROABS 6.8  --   --   --   --   --   --   --   --  HGB 14.1   < > 13.4 13.0 12.6* 11.2* 11.6* 12.6* 12.8*  HCT 45.2   < > 41.7 40.5 37.0* 34.7* 34.0* 40.3 38.8*  MCV 87.6  --  85.1 83.5  --  83.0  --  85.2 84.2  PLT 224  --  188 168  --  142*  --  188 215   < > = values in this interval not displayed.   Basic Metabolic Panel: Recent Labs  Lab 03/25/18 1702  03/26/18 0250  03/26/18  1616 03/26/18 1803 03/27/18 0426 03/27/18 0443 03/28/18 0443 03/29/18 0523  NA  --    < > 140   < > 145 143 141 144 137 138  K  --    < > 3.5   < > 4.3 4.5 4.1 4.2 4.9 4.2  CL  --    < > 119*   < > 122* 122* 119*  --  110 104  CO2  --    < > 12*   < > 17* 17* 16*  --  18* 24  GLUCOSE  --    < > 153*   < > 108* 104* 115*  --  103* 106*  BUN  --    < > 13   < > 10 10 10   --  15 24*  CREATININE  --    < > 1.33*   < > 1.51* 1.39* 1.35*  --  1.23 1.46*  CALCIUM  --    < > 8.0*   < > 8.0* 7.9* 8.0*  --  8.9 8.9  MG 1.8  --  1.9  --  2.1  --  2.0  --  2.0  --   PHOS 1.2*  --  1.8*  --  2.3*  --  2.8  --  5.4*  --    < > = values in this interval not displayed.   GFR: Estimated Creatinine Clearance: 54.2 mL/min (A) (by C-G formula based on SCr of 1.46 mg/dL (H)). Liver Function Tests: Recent Labs  Lab 03/24/18 2058  AST 250*  ALT 232*  ALKPHOS 68  BILITOT 1.1  PROT 6.7  ALBUMIN 3.4*   No results for input(s): LIPASE, AMYLASE in the last 168 hours. No results for input(s): AMMONIA in the last 168 hours. Coagulation Profile: Recent Labs  Lab 03/24/18 2019 03/25/18 0252 03/25/18 1100  INR 1.2 1.3* 1.3*   Cardiac Enzymes: Recent Labs  Lab 03/24/18 2058 03/25/18 0048 03/25/18 0744 03/25/18 1334  TROPONINI 0.04* 2.13* 4.80* 2.35*   BNP (last 3 results) No results for input(s): PROBNP in the last 8760 hours. HbA1C: No results for input(s): HGBA1C in the last 72 hours. CBG: Recent Labs  Lab 03/28/18 2052 03/29/18 0015 03/29/18 0312 03/29/18 0846 03/29/18 1153  GLUCAP 97 122* 115* 128* 104*   Lipid Profile: No results for input(s): CHOL, HDL, LDLCALC, TRIG, CHOLHDL, LDLDIRECT in the last 72 hours. Thyroid Function Tests: No results for input(s): TSH, T4TOTAL, FREET4, T3FREE, THYROIDAB in the last 72 hours. Anemia Panel: No results for input(s): VITAMINB12, FOLATE, FERRITIN, TIBC, IRON, RETICCTPCT in the last 72 hours. Urine analysis:    Component Value  Date/Time   COLORURINE STRAW (A) 03/25/2018 0015   APPEARANCEUR CLEAR 03/25/2018 0015   LABSPEC 1.011 03/25/2018 0015   PHURINE 7.0 03/25/2018 0015   GLUCOSEU 50 (A) 03/25/2018 0015   HGBUR MODERATE (A) 03/25/2018 0015   BILIRUBINUR NEGATIVE 03/25/2018 0015   KETONESUR NEGATIVE 03/25/2018 0015   PROTEINUR  NEGATIVE 03/25/2018 0015   NITRITE NEGATIVE 03/25/2018 0015   LEUKOCYTESUR NEGATIVE 03/25/2018 0015   Sepsis Labs: @LABRCNTIP (procalcitonin:4,lacticidven:4)  ) Recent Results (from the past 240 hour(s))  Respiratory Panel by PCR     Status: Abnormal   Collection Time: 03/24/18 10:07 PM  Result Value Ref Range Status   Adenovirus NOT DETECTED NOT DETECTED Final   Coronavirus 229E NOT DETECTED NOT DETECTED Final    Comment: (NOTE) The Coronavirus on the Respiratory Panel, DOES NOT test for the novel  Coronavirus (2019 nCoV)    Coronavirus HKU1 NOT DETECTED NOT DETECTED Final   Coronavirus NL63 DETECTED (A) NOT DETECTED Final   Coronavirus OC43 NOT DETECTED NOT DETECTED Final   Metapneumovirus NOT DETECTED NOT DETECTED Final   Rhinovirus / Enterovirus NOT DETECTED NOT DETECTED Final   Influenza A NOT DETECTED NOT DETECTED Final   Influenza B NOT DETECTED NOT DETECTED Final   Parainfluenza Virus 1 NOT DETECTED NOT DETECTED Final   Parainfluenza Virus 2 NOT DETECTED NOT DETECTED Final   Parainfluenza Virus 3 NOT DETECTED NOT DETECTED Final   Parainfluenza Virus 4 NOT DETECTED NOT DETECTED Final   Respiratory Syncytial Virus NOT DETECTED NOT DETECTED Final   Bordetella pertussis NOT DETECTED NOT DETECTED Final   Chlamydophila pneumoniae NOT DETECTED NOT DETECTED Final   Mycoplasma pneumoniae NOT DETECTED NOT DETECTED Final    Comment: Performed at [facility], [address], [place], [place] [address]  Culture, blood (routine x 2)     Status: None (Preliminary result)   Collection Time: 03/25/18 12:42 AM  Result Value Ref Range Status   Specimen Description BLOOD LEFT  A-LINE  Final   Special Requests   Final    BOTTLES DRAWN AEROBIC AND ANAEROBIC Blood Culture results may not be optimal due to an excessive volume of blood received in culture bottles   Culture   Final    NO GROWTH 4 DAYS Performed at [facility], [address] [place], [place] [address]    Report Status PENDING  Incomplete  Culture, blood (routine x 2)     Status: None (Preliminary result)   Collection Time: 03/25/18 12:44 AM  Result Value Ref Range Status   Specimen Description BLOOD LEFT HAND  Final   Special Requests   Final    BOTTLES DRAWN AEROBIC ONLY Blood Culture adequate volume   Culture   Final    NO GROWTH 4 DAYS Performed at [facility], [address], [place], [place] [address]    Report Status PENDING  Incomplete  MRSA PCR Screening     Status: None   Collection Time: 03/25/18  1:14 AM  Result Value Ref Range Status   MRSA by PCR NEGATIVE NEGATIVE Final    Comment:        The GeneXpert MRSA Assay (FDA approved for NASAL specimens only), is one component of a comprehensive MRSA colonization surveillance program. It is not intended to diagnose MRSA infection nor to guide or monitor treatment for MRSA infections. Performed at [facility], [address], [place], [place] [address]   Culture, respiratory (non-expectorated)     Status: None   Collection Time: 03/25/18  2:51 AM  Result Value Ref Range Status   Specimen Description TRACHEAL ASPIRATE  Final   Special Requests NONE  Final   Gram Stain   Final    RARE WBC PRESENT, PREDOMINANTLY PMN FEW GRAM POSITIVE COCCI IN PAIRS IN CLUSTERS RARE YEAST  Culture   Final    FEW Consistent with normal respiratory flora. Performed at [facility], [address] [place], [place] [address]    Report Status 03/27/2018 FINAL  Final         Radiology Studies: Dg Chest Port 1 View  Result Date: 03/28/2018 CLINICAL DATA:  Initial evaluation for acute dyspnea. EXAM: PORTABLE  CHEST 1 VIEW COMPARISON:  Prior radiograph from 03/26/2018. FINDINGS: Endotracheal tube in place with tip positioned 3.2 cm above the carina. Left subclavian approach CVC in place with tip overlying the distal SVC, stable. Enteric tube courses into the abdomen. Stable cardiomegaly. Mediastinal silhouette within normal limits. Lungs hypoinflated. Veiling opacity overlying the hemidiaphragms compatible with layering effusions, right greater than left. Bilateral parenchymal infiltrates again seen involving the lungs bilaterally, right greater than left, perhaps mildly improved at the right upper lung as compared to previous. Underlying mild diffuse pulmonary interstitial congestion. No pneumothorax. Osseous structures unchanged. IMPRESSION: 1. Support apparatus in satisfactory position as above. 2. Veiling opacities overlying the hemidiaphragms, compatible with layering pleural effusions, right worse than left. These appear slightly worsened from previous. 3. Persistent right greater than left parenchymal infiltrates, perhaps mildly improved at the right upper lobe as compared to previous. Electronically Signed   By: Rise Mu M.D.   On: 03/28/2018 05:05   Dg Abd Portable 1v  Result Date: 03/29/2018 CLINICAL DATA:  Nasogastric tube placement EXAM: PORTABLE ABDOMEN - 1 VIEW COMPARISON:  Portable exam 0048 hours compared to CT abdomen and pelvis of 12/31/2017 FINDINGS: Nasogastric tube coiled in mid stomach with tip projecting over distal antrum. Air-filled loops of large and small bowel in mid abdomen with scattered stool in colon. Catheter versus thermal probe coiled in pelvis. LEFT lower lobe consolidation. Bones unremarkable. IMPRESSION: Nasogastric tube coiled in mid stomach with tip projecting over distal gastric antrum. Electronically Signed   By: Ulyses Southward M.D.   On: 03/29/2018 01:09        Scheduled Meds: . chlorhexidine  15 mL Mouth Rinse BID  . heparin injection (subcutaneous)  5,000  Units Subcutaneous Q8H  . insulin aspart  0-9 Units Subcutaneous Q4H  . mouth rinse  15 mL Mouth Rinse q12n4p  . pantoprazole (PROTONIX) IV  40 mg Intravenous QHS  . potassium chloride  20 mEq Oral Daily  . sodium chloride flush  10-40 mL Intracatheter Q12H   Continuous Infusions: . sodium chloride Stopped (03/29/18 0140)  . dextrose 40 mL/hr at 03/29/18 1400     LOS: 5 days    Time spent: 35 minutes.    Berton Mount, MD  [facility] Pager #: [phone] 7PM-7AM contact night coverage as above

## 2018-03-29 NOTE — Evaluation (Signed)
Clinical/Bedside Swallow Evaluation Patient Details  Name: Kevin Rojas MRN: [id number] Date of Birth: [birth year]  Today's Date: 03/29/2018 Time: SLP Start Time (ACUTE ONLY): 1130 SLP Stop Time (ACUTE ONLY): 1143 SLP Time Calculation (min) (ACUTE ONLY): 13 min  Past Medical History: History reviewed. No pertinent past medical history. Past Surgical History: History reviewed. No pertinent surgical history. HPI:  63 year old man admitted 3/17 with V. fib arrest from home.  He has a history of chronic left bundle branch block. Underwent hypothermia protocol; vomited around [facility] airway. Intubated in ER 3/17-3/21.  NG placed 3/21 for stomach decompression due to ongoing vomiting.     Assessment / Plan / Recommendation Clinical Impression  Pt groggy, but sufficiently alert to participate in clinical swallowing evaluation.  No focal deficits; oriented to person.  Follows basic commands. Voice has good quality; cough strong after four-day intubation. Pt consumed thin liquids, ice chips, and applesauce with no overt s/s of aspiration; no coughing; no wet phonation post-swallow. Occasional belching. RR within appropriate parameters necessary for coordination of respiration/swallow.  Once pt is cleared by MD for oral diet and obstruction is ruled-out, recommend initiating a full liquid diet; meds whole in puree.  SLP will follow briefly to ensure safety, diet advancement.  D/W wife, who is at bedside today.  SLP Visit Diagnosis: Dysphagia, unspecified (R13.10)    Aspiration Risk       Diet Recommendation   full liquids WHEN CLEARED BY MD  Medication Administration: Whole meds with puree    Other  Recommendations Oral Care Recommendations: Oral care BID   Follow up Recommendations None      Frequency and Duration min 2x/week  1 week       Prognosis Prognosis for Safe Diet Advancement: Good      Swallow Study   General Date of Onset: 03/24/18 HPI: 63 year old man admitted 3/17 with V.  fib arrest from home.  He has a history of chronic left bundle branch block. Underwent hypothermia protocol; vomited around [facility] airway. Intubated in ER 3/17-3/21.  NG placed 3/21 for stomach decompression due to ongoing vomiting.   Type of Study: Bedside Swallow Evaluation Diet Prior to this Study: NPO Temperature Spikes Noted: Yes Respiratory Status: Nasal cannula History of Recent Intubation: Yes Length of Intubations (days): 4 days Date extubated: 03/28/18 Behavior/Cognition: Lethargic/Drowsy Oral Cavity Assessment: Within Functional Limits Oral Care Completed by SLP: No Oral Cavity - Dentition: Edentulous Self-Feeding Abilities: Needs assist Patient Positioning: Upright in bed Baseline Vocal Quality: Normal Volitional Cough: Strong Volitional Swallow: Able to elicit    Oral/Motor/Sensory Function Overall Oral Motor/Sensory Function: Within functional limits   Ice Chips Ice chips: Within functional limits   Thin Liquid Thin Liquid: Within functional limits    Nectar Thick Nectar Thick Liquid: Not tested   Honey Thick Honey Thick Liquid: Not tested   Puree Puree: Within functional limits   Solid    [name] [name] [name], MA CCC/SLP Acute Rehabilitation Services Office number [phone] Pager [phone]  Solid: Not tested      [name] [facility] 03/29/2018,12:00 PM

## 2018-03-29 NOTE — Progress Notes (Signed)
Patient noted with another episode of vomiting while doing scheduled routine mouth care. On call MD notified and updated. Recommendations made for abdominal xray to evaluate ileus/obstruction, IV Reglan to promote stomach emptying if no sign of obstruction, and NG tube placement to assist with stomach decompression. Orders received for NG tube placement and abdominal xray for tube placement verification. 3 attempts made without success prior to updating MD of inability to place NG tube. 12 Fr and 14 Fr catheter used during attempts. Right and left nare attempted as well. Patient gagging, not following directions when instructed to swallow, and tube consistently getting coiled and exiting out of patients mouth. Request made for IV pain medication / versed to assist with patients discomfort and increase patients cooperation with tube placement. Advised of patients current mental status and fluctuating drowsiness that sedation medications not recommended. One final attempt made with 12 Fr NG tube via right nare. Tube lubricated and advanced very slowly. NG tube successfully placed and secured to right nare. Tube placement confirmed via auscultation and abdominal Xray. Patient noted with mild bleeding via right and left nare due to unsuccessful attempts. No interventions required to stop bleeding. Patient able to cough and spit out bloody secretions / oral suctioning provided. Patients vitals remain stable throughout procedure. NG tube connected to low wall suction as ordered.

## 2018-03-29 NOTE — Progress Notes (Signed)
Patient's wife requested disability paperwork be filled out on patient's behalf by attending physician.   MD Ogbata rounded at bedside and RN discussed paperwork request with MD.  MD Sallee Provencal advised that patient's primary care physician should fill out the paperwork due to when the patient is discharged the insurance company/disability provider would not be able to reach MD Ogbata with questions and would need to discuss with patient's primary care physician.   RN informed patient's wife.   Patient's wife requested RN speak with patient's son as he had concerns about paperwork.  Patient's son concerned that primary care physician will not be able to fill out the paperwork as they are not handling patient's care at this time and are not familiar with patient's current hospitalization.  Patient's family also concerned with upcoming visitor restrictions that they will have a very difficult time getting the paperwork back to MD if primary care provider refuses to fill out paperwork.  Patient's family requested MD Ogbata come bedside and/or call them to discuss disability paperwork concerns.  RN paged MD Ogbata via [email or web address].  MD Ogbata called RN and phone was given to patient's wife so MD Obgata and patient's family could discuss concerns.

## 2018-03-29 NOTE — Evaluation (Signed)
Occupational Therapy Evaluation Patient Details Name: Kevin Rojas MRN: [id number] DOB: 08/15/1955 Today's Date: 03/29/2018    History of Present Illness Pt is a 63 year old man admitted 3/17 with V fib arrest, + CPR. ETT 3/17-3/21. Hospital course complicated by ongoing vomiting, NGT placed 3/21. PMH: chronic L bundle branch block.    Clinical Impression   Pt was independent prior to admission. Drives a concrete truck. Pt presents with generalized weakness, decreased activity tolerance and impaired standing balance. He requires 2 person assist for OOB activities. Pt minimally verbal during session, will continue to assess cognition. Pt requires up to total assist for ADL. Anticipate pt will be progress well with intensive rehab in CIR. He has a supportive wife to assist at home. Will follow acutely.    Follow Up Recommendations  CIR    Equipment Recommendations  3 in 1 bedside commode    Recommendations for Other Services Rehab consult     Precautions / Restrictions Precautions Precautions: Fall Precaution Comments: NGT Restrictions Weight Bearing Restrictions: No      Mobility Bed Mobility Overal bed mobility: Needs Assistance Bed Mobility: Supine to Sit;Sit to Supine     Supine to sit: Min assist Sit to supine: Mod assist   General bed mobility comments: increased time, min assist to position hips at EOB, assist for LEs back into bed   Transfers Overall transfer level: Needs assistance Equipment used: 2 person hand held assist Transfers: Sit to/from Stand Sit to Stand: +2 physical assistance;Min assist         General transfer comment: assist to rise and stedy, pt able to take several side steps toward HOB, flexed posture    Balance Overall balance assessment: Needs assistance   Sitting balance-Leahy Scale: Fair     Standing balance support: Bilateral upper extremity supported Standing balance-Leahy Scale: Poor                              ADL either performed or assessed with clinical judgement   ADL Overall ADL's : Needs assistance/impaired Eating/Feeding: NPO   Grooming: Minimal assistance;Sitting   Upper Body Bathing: Sitting;Maximal assistance   Lower Body Bathing: Maximal assistance;Sit to/from stand;+2 for physical assistance   Upper Body Dressing : Moderate assistance;Sitting   Lower Body Dressing: +2 for physical assistance;Sit to/from stand;Maximal assistance   Toilet Transfer: +2 for physical assistance;Minimal assistance   Toileting- Clothing Manipulation and Hygiene: +2 for physical assistance;Total assistance;Sit to/from stand               Vision Patient Visual Report: No change from baseline       Perception     Praxis      Pertinent Vitals/Pain Pain Assessment: Faces Faces Pain Scale: No hurt     Hand Dominance Right   Extremity/Trunk Assessment Upper Extremity Assessment Upper Extremity Assessment: Generalized weakness   Lower Extremity Assessment Lower Extremity Assessment: Defer to PT evaluation   Cervical / Trunk Assessment Cervical / Trunk Assessment: Other exceptions Cervical / Trunk Exceptions: weakness, forward head   Communication Communication Communication: No difficulties;Other (comment)(minimal verbalization)   Cognition Arousal/Alertness: Awake/alert Behavior During Therapy: Flat affect Overall Cognitive Status: Within Functional Limits for tasks assessed                                 General Comments: WFL for tasks assessed this visit, needs ongoing assessment,  minimally verbal during session   General Comments       Exercises     Shoulder Instructions      Home Living Family/patient expects to be discharged to:: Private residence Living Arrangements: Spouse/significant other Available Help at Discharge: Family;Available 24 hours/day Type of Home: House Home Access: Stairs to enter [employer] of Steps: 5+2 Entrance  Stairs-Rails: Right Home Layout: One level     Bathroom Shower/Tub: Chief Strategy Officer: Standard     Home Equipment: None   Additional Comments: has access to a walker      Prior Functioning/Environment Level of Independence: Independent        Comments: drives, concrete truck driver        OT Problem List: Impaired balance (sitting and/or standing);Decreased activity tolerance;Decreased strength;Decreased knowledge of use of DME or AE      OT Treatment/Interventions: Self-care/ADL training;DME and/or AE instruction;Therapeutic activities;Patient/family education;Balance training    OT Goals(Current goals can be found in the care plan section) Acute Rehab OT Goals Patient Stated Goal: to return to PLOF OT Goal Formulation: With patient/family Time For Goal Achievement: 04/12/18 Potential to Achieve Goals: Good ADL Goals Pt Will Perform Eating: sitting;Independently Pt Will Perform Grooming: with min guard assist;standing Pt Will Perform Upper Body Dressing: with set-up;sitting Pt Will Perform Lower Body Dressing: with min guard assist;sit to/from stand Pt Will Transfer to Toilet: with min guard assist;ambulating Pt Will Perform Toileting - Clothing Manipulation and hygiene: with min guard assist;sit to/from stand Additional ADL Goal #1: Pt will participate in formal cognitive assessment to determine any deficits.  OT Frequency: Min 2X/week   Barriers to D/C:            Co-evaluation PT/OT/SLP Co-Evaluation/Treatment: Yes Reason for Co-Treatment: For patient/therapist safety;Complexity of the patient's impairments (multi-system involvement)   OT goals addressed during session: ADL's and self-care      AM-PAC OT "6 Clicks" Daily Activity     Outcome Measure Help from another person eating meals?: Total Help from another person taking care of personal grooming?: A Little Help from another person toileting, which includes using toliet, bedpan, or  urinal?: Total Help from another person bathing (including washing, rinsing, drying)?: A Lot Help from another person to put on and taking off regular upper body clothing?: A Lot Help from another person to put on and taking off regular lower body clothing?: Total 6 Click Score: 10   End of Session Equipment Utilized During Treatment: Oxygen Nurse Communication: Mobility status  Activity Tolerance: Patient tolerated treatment well Patient left: in bed;with call bell/phone within reach;with family/visitor present  OT Visit Diagnosis: Unsteadiness on feet (R26.81);Other abnormalities of gait and mobility (R26.89);Muscle weakness (generalized) (M62.81)                Time: 0177-9390 OT Time Calculation (min): 16 min Charges:  OT General Charges $OT Visit: 1 Visit OT Evaluation $OT Eval Moderate Complexity: 1 Mod  Martie Round, OTR/L Acute Rehabilitation Services Pager: [phone] Office: [phone] Evern Bio 03/29/2018, 1:36 PM

## 2018-03-30 ENCOUNTER — Encounter (HOSPITAL_COMMUNITY): Payer: Self-pay | Admitting: Physical Medicine and Rehabilitation

## 2018-03-30 DIAGNOSIS — R5381 Other malaise: Secondary | ICD-10-CM

## 2018-03-30 LAB — CULTURE, BLOOD (ROUTINE X 2)
CULTURE: NO GROWTH
Culture: NO GROWTH
Special Requests: ADEQUATE

## 2018-03-30 LAB — GLUCOSE, CAPILLARY
Glucose-Capillary: 104 mg/dL — ABNORMAL HIGH (ref 70–99)
Glucose-Capillary: 104 mg/dL — ABNORMAL HIGH (ref 70–99)
Glucose-Capillary: 106 mg/dL — ABNORMAL HIGH (ref 70–99)
Glucose-Capillary: 111 mg/dL — ABNORMAL HIGH (ref 70–99)
Glucose-Capillary: 114 mg/dL — ABNORMAL HIGH (ref 70–99)
Glucose-Capillary: 98 mg/dL (ref 70–99)

## 2018-03-30 LAB — RENAL FUNCTION PANEL
Albumin: 3 g/dL — ABNORMAL LOW (ref 3.5–5.0)
Anion gap: 11 (ref 5–15)
BUN: 24 mg/dL — ABNORMAL HIGH (ref 8–23)
CO2: 23 mmol/L (ref 22–32)
Calcium: 9.1 mg/dL (ref 8.9–10.3)
Chloride: 105 mmol/L (ref 98–111)
Creatinine, Ser: 1.35 mg/dL — ABNORMAL HIGH (ref 0.61–1.24)
GFR calc Af Amer: >60 mL/min (ref >60)
GFR calc non Af Amer: 56 mL/min — ABNORMAL LOW (ref >60)
Glucose, Bld: 105 mg/dL — ABNORMAL HIGH (ref 70–99)
Phosphorus: 3 mg/dL (ref 2.5–4.6)
Potassium: 3.8 mmol/L (ref 3.5–5.1)
Sodium: 139 mmol/L (ref 135–145)

## 2018-03-30 LAB — CBC WITH DIFFERENTIAL/PLATELET
Abs Immature Granulocytes: 0.1 10*3/uL — ABNORMAL HIGH (ref 0.00–0.07)
Basophils Absolute: 0 10*3/uL (ref 0.0–0.1)
Basophils Relative: 0 %
Eosinophils Absolute: 0 10*3/uL (ref 0.0–0.5)
Eosinophils Relative: 0 %
HCT: 40.9 % (ref 39.0–52.0)
Hemoglobin: 13.6 g/dL (ref 13.0–17.0)
Immature Granulocytes: 1 %
Lymphocytes Relative: 11 %
Lymphs Abs: 1.4 10*3/uL (ref 0.7–4.0)
MCH: 27.8 pg (ref 26.0–34.0)
MCHC: 33.3 g/dL (ref 30.0–36.0)
MCV: 83.6 fL (ref 80.0–100.0)
Monocytes Absolute: 1.3 10*3/uL — ABNORMAL HIGH (ref 0.1–1.0)
Monocytes Relative: 10 %
Neutro Abs: 10.4 10*3/uL — ABNORMAL HIGH (ref 1.7–7.7)
Neutrophils Relative %: 78 %
Platelets: 219 10*3/uL (ref 150–400)
RBC: 4.89 MIL/uL (ref 4.22–5.81)
RDW: 14.5 % (ref 11.5–15.5)
WBC: 13.3 10*3/uL — ABNORMAL HIGH (ref 4.0–10.5)
nRBC: 0 % (ref 0.0–0.2)

## 2018-03-30 LAB — MAGNESIUM: Magnesium: 2.3 mg/dL (ref 1.7–2.4)

## 2018-03-30 MED ORDER — ALBUTEROL SULFATE (2.5 MG/3ML) 0.083% IN NEBU: 2.5000 mg | INHALATION_SOLUTION | RESPIRATORY_TRACT | Status: DC | PRN

## 2018-03-30 NOTE — Progress Notes (Signed)
SLP Cancellation Note  Patient Details Name: Kevin Rojas MRN: [id number] DOB: [birth year]   Cancelled treatment:       Reason Eval/Treat Not Completed: Medical issues which prohibited therapy. Pt remains NPO with orders for NGT to LIS. Evaluating SLP had recommended full liquid diet when cleared for POs. This SLP reached out to MD regarding medical clearance for PO diet. Will hold POs pending clearance.    Virl Axe Louie Meaders 03/30/2018, 10:13 AM  Ivar Drape, M.A. CCC-SLP Acute Herbalist [phone] Office [phone]

## 2018-03-30 NOTE — Progress Notes (Signed)
Inpatient Rehabilitation Admissions Coordinator  I met with patient at bedside for rehab assessment. He is up in chair with NGT to suction. I will follow his progress medically and with therapy to assist with planning dispo when appropriate.  Danne Baxter, RN, MSN Rehab Admissions Coordinator [phone] 03/30/2018 2:39 PM

## 2018-03-30 NOTE — Progress Notes (Addendum)
Occupational Therapy Treatment Patient Details Name: Kevin Rojas MRN: [id number] DOB: [birth year] Today's Date: 03/30/2018    History of present illness Pt is a 63 year old man admitted 3/17 with V fib arrest, + CPR. ETT 3/17-3/21. Hospital course complicated by ongoing vomiting, NGT placed 3/21. PMH: chronic L bundle branch block.    OT comments  Pt progressing well towards established OT goals and is motivated to participate in therapy. Pt performing functional mobility with Min A +2 for safety and balance. Pt continues to present with decreased processing and would benefit from further assessment for cognition. Continue to recommend dc to CIR and will continue follow acutely as admitted.    Follow Up Recommendations  CIR    Equipment Recommendations  3 in 1 bedside commode    Recommendations for Other Services Rehab consult    Precautions / Restrictions Precautions Precautions: Fall Precaution Comments: NGT Restrictions Weight Bearing Restrictions: No       Mobility Bed Mobility Overal bed mobility: Needs Assistance Bed Mobility: Supine to Sit     Supine to sit: Min assist     General bed mobility comments: increased time and with cueing to complete task  Transfers Overall transfer level: Needs assistance Equipment used: 2 person hand held assist Transfers: Sit to/from Stand Sit to Stand: +2 physical assistance;Min assist         General transfer comment: light assist to rise at bedside; noted posterior bias upon initial standing    Balance Overall balance assessment: Needs assistance Sitting-balance support: Single extremity supported;Bilateral upper extremity supported;Feet supported Sitting balance-Leahy Scale: Fair Sitting balance - Comments: trunk flexed throughou   Standing balance support: Bilateral upper extremity supported Standing balance-Leahy Scale: Poor                             ADL either performed or assessed with clinical  judgement   ADL Overall ADL's : Needs assistance/impaired Eating/Feeding: NPO                       Toilet Transfer: Minimal assistance;+2 for physical assistance;+2 for safety/equipment;Ambulation(Simulated to recliner) Toilet Transfer Details (indicate cue type and reason): Min A for balance.          Functional mobility during ADLs: Minimal assistance;+2 for physical assistance General ADL Comments: Pt highly motivated to participate in therapy and perform OOB activity. Min A for balance..     Vision       Perception     Praxis      Cognition Arousal/Alertness: Awake/alert Behavior During Therapy: WFL for tasks assessed/performed Overall Cognitive Status: Impaired/Different from baseline Area of Impairment: Problem solving                             Problem Solving: Slow processing;Requires verbal cues General Comments: Pt WFL for tasks completed. Presenting with slow processing and perseverating on statement "I have been in the bed for three days" and repeating this throughout session. Pt very motivated to participate in therapy and perform OOB activity. Will need further assessment.         Exercises     Shoulder Instructions       General Comments VSS     Pertinent Vitals/ Pain       Pain Assessment: No/denies pain  Home Living  Prior Functioning/Environment              Frequency  Min 2X/week        Progress Toward Goals  OT Goals(current goals can now be found in the care plan section)  Progress towards OT goals: Progressing toward goals  Acute Rehab OT Goals Patient Stated Goal: to return to PLOF OT Goal Formulation: With patient/family Time For Goal Achievement: 04/12/18 Potential to Achieve Goals: Good ADL Goals Pt Will Perform Eating: sitting;Independently Pt Will Perform Grooming: with min guard assist;standing Pt Will Perform Upper Body Dressing:  with set-up;sitting Pt Will Perform Lower Body Dressing: with min guard assist;sit to/from stand Pt Will Transfer to Toilet: with min guard assist;ambulating Pt Will Perform Toileting - Clothing Manipulation and hygiene: with min guard assist;sit to/from stand Additional ADL Goal #1: Pt will participate in formal cognitive assessment to determine any deficits.  Plan Discharge plan remains appropriate    Co-evaluation    PT/OT/SLP Co-Evaluation/Treatment: Yes Reason for Co-Treatment: Complexity of the patient's impairments (multi-system involvement);For patient/therapist safety;To address functional/ADL transfers PT goals addressed during session: Mobility/safety with mobility;Balance OT goals addressed during session: ADL's and self-care      AM-PAC OT "6 Clicks" Daily Activity     Outcome Measure   Help from another person eating meals?: Total Help from another person taking care of personal grooming?: A Little Help from another person toileting, which includes using toliet, bedpan, or urinal?: Total Help from another person bathing (including washing, rinsing, drying)?: A Lot Help from another person to put on and taking off regular upper body clothing?: A Lot Help from another person to put on and taking off regular lower body clothing?: A Lot 6 Click Score: 11    End of Session Equipment Utilized During Treatment: Gait belt  OT Visit Diagnosis: Unsteadiness on feet (R26.81);Other abnormalities of gait and mobility (R26.89);Muscle weakness (generalized) (M62.81)   Activity Tolerance Patient tolerated treatment well   Patient Left in chair;with call bell/phone within reach   Nurse Communication Mobility status        Time: 1310-1333 OT Time Calculation (min): 23 min  Charges: OT General Charges $OT Visit: 1 Visit OT Treatments $Self Care/Home Management : 8-22 mins  Lashawn Bromwell MSOT, OTR/L Acute Rehab Pager: [phone] Office: [phone]   Theodoro Grist  Demaris Leavell 03/30/2018, 3:39 PM

## 2018-03-30 NOTE — Progress Notes (Signed)
Physical Therapy Treatment Patient Details Name: Kevin Rojas MRN: [id number] DOB: [birth year] Today's Date: 03/30/2018    History of Present Illness Pt is a 63 year old man admitted 3/17 with V fib arrest, + CPR. ETT 3/17-3/21. Hospital course complicated by ongoing vomiting, NGT placed 3/21. PMH: chronic L bundle branch block.     PT Comments    Patient seen for mobility progression. Patient very motivated to participate with therapies. Patient requiring +1/2 HHA for all mobility with patient demonstrating scissoring gait with increased unsteadiness. HR up to 123 bpm with mobility, patient asymptomatic. Will continue to recommend CIR level therapies at discharge to progress safe and independent functional mobility.     Follow Up Recommendations  CIR     Equipment Recommendations  (TBD)    Recommendations for Other Services Rehab consult     Precautions / Restrictions Precautions Precautions: Fall Precaution Comments: NGT Restrictions Weight Bearing Restrictions: No    Mobility  Bed Mobility Overal bed mobility: Needs Assistance Bed Mobility: Supine to Sit     Supine to sit: Min assist     General bed mobility comments: increased time and with cueing to complete task  Transfers Overall transfer level: Needs assistance Equipment used: 2 person hand held assist Transfers: Sit to/from Stand Sit to Stand: +2 physical assistance;Min assist         General transfer comment: light assist to rise at bedside; noted posterior bias upon initial standing  Ambulation/Gait Ambulation/Gait assistance: Min assist;+2 physical assistance;Min guard Gait Distance (Feet): 15 Feet(x2) Assistive device: 2 person hand held assist Gait Pattern/deviations: Step-to pattern;Decreased stride length;Scissoring;Narrow base of support Gait velocity: decreased   General Gait Details: cueing to increase BOS for improved balance; unsteadiness throughout; able to stand in hallway and talk to  mobility tech; HR up to 123   Stairs             Wheelchair Mobility    Modified Rankin (Stroke Patients Only)       Balance Overall balance assessment: Needs assistance Sitting-balance support: Single extremity supported;Bilateral upper extremity supported;Feet supported Sitting balance-Leahy Scale: Fair Sitting balance - Comments: trunk flexed throughou   Standing balance support: Bilateral upper extremity supported Standing balance-Leahy Scale: Poor                              Cognition Arousal/Alertness: Awake/alert Behavior During Therapy: WFL for tasks assessed/performed Overall Cognitive Status: Within Functional Limits for tasks assessed                                 General Comments: WFL for tasks assessed this visit, needs ongoing assessment, minimally verbal during session      Exercises      General Comments        Pertinent Vitals/Pain Pain Assessment: No/denies pain    Home Living                      Prior Function            PT Goals (current goals can now be found in the care plan section) Acute Rehab PT Goals Patient Stated Goal: to return to PLOF PT Goal Formulation: With patient/family Time For Goal Achievement: 04/12/18 Potential to Achieve Goals: Good Progress towards PT goals: Progressing toward goals    Frequency    Min 3X/week  PT Plan Current plan remains appropriate    Co-evaluation PT/OT/SLP Co-Evaluation/Treatment: Yes Reason for Co-Treatment: Complexity of the patient's impairments (multi-system involvement);For patient/therapist safety;To address functional/ADL transfers PT goals addressed during session: Mobility/safety with mobility;Balance        AM-PAC PT "6 Clicks" Mobility   Outcome Measure  Help needed turning from your back to your side while in a flat bed without using bedrails?: A Little Help needed moving from lying on your back to sitting on the side  of a flat bed without using bedrails?: A Little Help needed moving to and from a bed to a chair (including a wheelchair)?: A Little Help needed standing up from a chair using your arms (e.g., wheelchair or bedside chair)?: A Little Help needed to walk in hospital room?: A Little Help needed climbing 3-5 steps with a railing? : Total 6 Click Score: 16    End of Session Equipment Utilized During Treatment: Oxygen Activity Tolerance: Patient tolerated treatment well Patient left: with call bell/phone within reach;in chair;with nursing/sitter in room Nurse Communication: Mobility status PT Visit Diagnosis: Unsteadiness on feet (R26.81);Difficulty in walking, not elsewhere classified (R26.2);Muscle weakness (generalized) (M62.81)     Time: 4403-4742 PT Time Calculation (min) (ACUTE ONLY): 23 min  Charges:  $Gait Training: 8-22 mins                      Kipp Laurence, PT, DPT Supplemental Physical Therapist 03/30/18 1:58 PM Pager: [phone] Office: [phone]

## 2018-03-30 NOTE — Progress Notes (Addendum)
The patient has been seen in conjunction with Vin Bhagat, PAC. All aspects of care have been considered and discussed. The patient has been personally interviewed, examined, and all clinical data has been reviewed.   No new findings.  Awaiting sufficient neuro recovery to allow cath and planning for AV surgical therapy if appropriate.   Progress Note  Patient Name: Kevin Rojas Date of Encounter: 03/30/2018  Primary Cardiologist: Tonny Bollman, MD  Subjective    Encephalopathy seems improving. Sleepy this morning. No chest pain.   Inpatient Medications    Scheduled Meds: . chlorhexidine  15 mL Mouth Rinse BID  . heparin injection (subcutaneous)  5,000 Units Subcutaneous Q8H  . insulin aspart  0-9 Units Subcutaneous Q4H  . mouth rinse  15 mL Mouth Rinse q12n4p  . pantoprazole (PROTONIX) IV  40 mg Intravenous QHS  . potassium chloride  20 mEq Oral Daily   Continuous Infusions: . sodium chloride Stopped (03/29/18 1724)  . ampicillin-sulbactam (UNASYN) IV 3 g (03/30/18 0421)  . dextrose 40 mL/hr at 03/29/18 2130   PRN Meds: acetaminophen, albuterol, hydrALAZINE, ondansetron (ZOFRAN) IV   Vital Signs    Vitals:   03/29/18 2020 03/30/18 0012 03/30/18 0345 03/30/18 0747  BP: 135/72 (!) 145/95 (!) 148/84 136/78  Pulse: 82 78 95 81  Resp: (!) 22 (!) 22 20 19   Temp: 99.8 F (37.7 C) 99.1 F (37.3 C) 99.7 F (37.6 C)   TempSrc: Oral Oral Oral Oral  SpO2: 94% 95% 96% 93%  Weight:   66.9 kg   Height:        Intake/Output Summary (Last 24 hours) at 03/30/2018 0810 Last data filed at 03/30/2018 0600 Gross per 24 hour  Intake 1450.51 ml  Output 1930 ml  Net -479.49 ml   Last 3 Weights 03/30/2018 03/28/2018 03/27/2018  Weight (lbs) 147 lb 6.4 oz 162 lb 0.6 oz 167 lb 1.7 oz  Weight (kg) 66.86 kg 73.5 kg 75.8 kg      Telemetry    Sinus rhythm at controlled rate- Personally Reviewed  ECG    N/A  Physical Exam   GEN: No acute distress.   Neck: No JVD Cardiac:  RRR, no murmurs, rubs, or gallops.  Respiratory: Clear to auscultation bilaterally. GI: Soft, nontender, non-distended  MS: No edema; No deformity. Neuro:  Nonfocal  Psych: Sleepy this morning  Labs    Chemistry Recent Labs  Lab 03/24/18 2058  03/28/18 0443 03/29/18 0523 03/30/18 0344  NA  --    < > 137 138 139  K  --    < > 4.9 4.2 3.8  CL  --    < > 110 104 105  CO2  --    < > 18* 24 23  GLUCOSE  --    < > 103* 106* 105*  BUN  --    < > 15 24* 24*  CREATININE  --    < > 1.23 1.46* 1.35*  CALCIUM  --    < > 8.9 8.9 9.1  PROT 6.7  --   --   --   --   ALBUMIN 3.4*  --   --   --  3.0*  AST 250*  --   --   --   --   ALT 232*  --   --   --   --   ALKPHOS 68  --   --   --   --   BILITOT 1.1  --   --   --   --  GFRNONAA  --    < > >60 51* 56*  GFRAA  --    < > >60 59* >60  ANIONGAP  --    < > 9 10 11    < > = values in this interval not displayed.     Hematology Recent Labs  Lab 03/28/18 0443 03/29/18 0523 03/30/18 0344  WBC 12.5* 17.2* 13.3*  RBC 4.73 4.61 4.89  HGB 12.6* 12.8* 13.6  HCT 40.3 38.8* 40.9  MCV 85.2 84.2 83.6  MCH 26.6 27.8 27.8  MCHC 31.3 33.0 33.3  RDW 15.1 14.8 14.5  PLT 188 215 219    Cardiac Enzymes Recent Labs  Lab 03/24/18 2058 03/25/18 0048 03/25/18 0744 03/25/18 1334  TROPONINI 0.04* 2.13* 4.80* 2.35*    Recent Labs  Lab 03/24/18 2112  TROPIPOC 0.11*     Radiology    Dg Abd Portable 1v  Result Date: 03/29/2018 CLINICAL DATA:  Nasogastric tube placement EXAM: PORTABLE ABDOMEN - 1 VIEW COMPARISON:  Portable exam 0048 hours compared to CT abdomen and pelvis of 12/31/2017 FINDINGS: Nasogastric tube coiled in mid stomach with tip projecting over distal antrum. Air-filled loops of large and small bowel in mid abdomen with scattered stool in colon. Catheter versus thermal probe coiled in pelvis. LEFT lower lobe consolidation. Bones unremarkable. IMPRESSION: Nasogastric tube coiled in mid stomach with tip projecting over distal gastric  antrum. Electronically Signed   By: Ulyses Southward M.D.   On: 03/29/2018 01:09    Cardiac Studies   Echo 03/25/18 IMPRESSIONS   1. The left ventricle has mildly reduced systolic function, with an ejection fraction of 45-50%. The cavity size was mildly dilated. Left ventricular diastolic Doppler parameters are consistent with pseudonormalization.  2. The right ventricle has normal systolic function. The cavity was normal. There is no increase in right ventricular wall thickness.  3. Right atrial size was mildly dilated.  4. Trivial pericardial effusion is present.  5. Mild thickening of the mitral valve leaflet.  6. The aortic valveis bicuspid Aortic valve regurgitation is severe by color flow Doppler.  7. The aortic valve appears to be a bicuspid or functionally bicuspid AV. There is severe AI.     The aortic root is mildly dilated.  8. There is mild dilatation of the ascending aorta.  9.     The aortic valve appears to be a bicuspid or functionally bicuspid AV. There is severe AI.     The aortic root is mildly dilated .     There is concern for a Type A aortic dissection.     Suggest STAT CT angiogram of the chest for further evaluation.  Patient Profile     63 y.o. male with ventricular fibrillatory arrest at home, in normal state of health no prior cardiac history sitting on couch wife went out for about 10 minutes came back found him unconscious.  CPR.  Assessment & Plan    1. Ventricular fibrillatory arrest - unknown etiology. His hypoxic encephalopathy seems to be slowly improving. Echo showed LVEF of 45%.   2. Severe aortic valve regurgitation bicuspid aortic valve - Not in heart failure.  - At this juncture, need to continue to monitor for improvement before considering possible operative candidacy. - It is often helpful in severe regurgitation of the aortic valve to maintain an increased heart rate, because of this, low-dose beta-blocker likely should only be used.  At this  time holding off of beta-blocker initiation until he is able to take oral  successfully.  3. Mildly reduced systolic function - EF was 40 to 45% post arrest.  Unknown chronicity of this.  BB as above.   4. Left bundle branch block - Chronic, noted back in December  5. Elevated troponin -Likely demand ischemia in the setting of his cardiac arrest, rise and fall troponin noted.  4.8 at peak, decreased to 2.35.    6. Dilated aortic root - 4.4 cm on CT scan- likely related to bicuspid aortic valve.  Nonsurgical at this point.  Otherwise per primary. Plan for inpatient rehab.   For questions or updates, please contact [facility] Please consult [email or web address] for contact info under        SignedManson Passey, PA  03/30/2018, 8:10 AM

## 2018-03-30 NOTE — Progress Notes (Signed)
[place] TEAM 1 - Stepdown/ICU TEAM  Caley Farner  EMV:[id number] DOB: November 15, 1955 DOA: 03/24/2018 PCP: [name], MD    Brief Narrative:  [phone] w/ a hx of nephrolithiasis who presented from home with Vfib cardiac arrest on 3/17. CPR started by wife. Found in Vfib shocked twice and king airway placed. Vomited around [facility] airway. Intubated and stable in ER but not purposeful. Hypothermia protocol was utilized.   Subjective: Pt is sedate/lethargic. He can not provide a ROS. He is in no apparent resp distress, and does not appear uncomfortable.   Assessment & Plan:  V. fib arrest / bicuspid aortic valve with severe regurgitation Etiology unclear - Cardiology is following - EF 45% on TTE   Hypoxic encephalopathy post arrest Slow to improve  Coronavirus upper respiratory infection - NOT SARS-CoV2  Severe aortic valve regurgitation bicuspid aortic valve Possibility of surgery will hinge upon recovery of mental capacity   Possible aspiration pneumonia Has completed 7 days of Unasyn therapy as per today -discontinue antibiotics today and follow clinically  Acute kidney injury Due to cardiogenic shock in the setting of V. fib arrest -creatinine slowly improving  DVT prophylaxis: Subcutaneous heparin Code Status: FULL CODE Family Communication: no family present at time of exam  Disposition Plan: SDU  Consultants:  Cardiology  Antimicrobials:  Unasyn 3/17 > 3/23  Objective: Blood pressure (!) 146/77, pulse 83, temperature 99.1 F (37.3 C), temperature source Oral, resp. rate (!) 24, height 5\' 10"  (1.778 m), weight 66.9 kg, SpO2 91 %.  Intake/Output Summary (Last 24 hours) at 03/30/2018 1735 Last data filed at 03/30/2018 1613 Gross per 24 hour  Intake 1707.2 ml  Output 430 ml  Net 1277.2 ml   Filed Weights   03/27/18 0454 03/28/18 0500 03/30/18 0345  Weight: 75.8 kg 73.5 kg 66.9 kg    Examination: General: No acute respiratory distress Lungs: Clear to  auscultation bilaterally without wheezes or crackles Cardiovascular: Regular rate and rhythm without murmur gallop or rub normal S1 and S2 Abdomen: Nontender, nondistended, soft, bowel sounds positive, no rebound, no ascites, no appreciable mass Extremities: No significant cyanosis, clubbing, or edema bilateral lower extremities  CBC: Recent Labs  Lab 03/24/18 2130  03/28/18 0443 03/29/18 0523 03/30/18 0344  WBC 10.5   < > 12.5* 17.2* 13.3*  NEUTROABS 6.8  --   --   --  10.4*  HGB 14.1   < > 12.6* 12.8* 13.6  HCT 45.2   < > 40.3 38.8* 40.9  MCV 87.6   < > 85.2 84.2 83.6  PLT 224   < > 188 215 219   < > = values in this interval not displayed.   Basic Metabolic Panel: Recent Labs  Lab 03/27/18 0426  03/28/18 0443 03/29/18 0523 03/30/18 0344  NA 141   < > 137 138 139  K 4.1   < > 4.9 4.2 3.8  CL 119*  --  110 104 105  CO2 16*  --  18* 24 23  GLUCOSE 115*  --  103* 106* 105*  BUN 10  --  15 24* 24*  CREATININE 1.35*  --  1.23 1.46* 1.35*  CALCIUM 8.0*  --  8.9 8.9 9.1  MG 2.0  --  2.0  --  2.3  PHOS 2.8  --  5.4*  --  3.0   < > = values in this interval not displayed.   GFR: Estimated Creatinine Clearance: 53.7 mL/min (A) (by C-G formula based on SCr of 1.35 mg/dL (  H)).  Liver Function Tests: Recent Labs  Lab 03/24/18 2058 03/30/18 0344  AST 250*  --   ALT 232*  --   ALKPHOS 68  --   BILITOT 1.1  --   PROT 6.7  --   ALBUMIN 3.4* 3.0*    Coagulation Profile: Recent Labs  Lab 03/24/18 2019 03/25/18 0252 03/25/18 1100  INR 1.2 1.3* 1.3*    Cardiac Enzymes: Recent Labs  Lab 03/24/18 2058 03/25/18 0048 03/25/18 0744 03/25/18 1334  TROPONINI 0.04* 2.13* 4.80* 2.35*    HbA1C: Hgb A1c MFr Bld  Date/Time Value Ref Range Status  03/24/2018 09:30 PM 5.3 4.8 - 5.6 % Final    Comment:    (NOTE) Pre diabetes:          5.7%-6.4% Diabetes:              >6.4% Glycemic control for   <7.0% adults with diabetes     CBG: Recent Labs  Lab 03/30/18 0015  03/30/18 0356 03/30/18 0749 03/30/18 1200 03/30/18 1603  GLUCAP 104* 114* 104* 111* 106*    Recent Results (from the past 240 hour(s))  Respiratory Panel by PCR     Status: Abnormal   Collection Time: 03/24/18 10:07 PM  Result Value Ref Range Status   Adenovirus NOT DETECTED NOT DETECTED Final   Coronavirus 229E NOT DETECTED NOT DETECTED Final    Comment: (NOTE) The Coronavirus on the Respiratory Panel, DOES NOT test for the novel  Coronavirus (2019 nCoV)    Coronavirus HKU1 NOT DETECTED NOT DETECTED Final   Coronavirus NL63 DETECTED (A) NOT DETECTED Final   Coronavirus OC43 NOT DETECTED NOT DETECTED Final   Metapneumovirus NOT DETECTED NOT DETECTED Final   Rhinovirus / Enterovirus NOT DETECTED NOT DETECTED Final   Influenza A NOT DETECTED NOT DETECTED Final   Influenza B NOT DETECTED NOT DETECTED Final   Parainfluenza Virus 1 NOT DETECTED NOT DETECTED Final   Parainfluenza Virus 2 NOT DETECTED NOT DETECTED Final   Parainfluenza Virus 3 NOT DETECTED NOT DETECTED Final   Parainfluenza Virus 4 NOT DETECTED NOT DETECTED Final   Respiratory Syncytial Virus NOT DETECTED NOT DETECTED Final   Bordetella pertussis NOT DETECTED NOT DETECTED Final   Chlamydophila pneumoniae NOT DETECTED NOT DETECTED Final   Mycoplasma pneumoniae NOT DETECTED NOT DETECTED Final    Comment: Performed at [facility], [address] [place], [place] [address]  Culture, blood (routine x 2)     Status: None   Collection Time: 03/25/18 12:42 AM  Result Value Ref Range Status   Specimen Description BLOOD LEFT A-LINE  Final   Special Requests   Final    BOTTLES DRAWN AEROBIC AND ANAEROBIC Blood Culture results may not be optimal due to an excessive volume of blood received in culture bottles   Culture   Final    NO GROWTH 5 DAYS Performed at [facility], [address] [place], [place] [address]    Report Status 03/30/2018 FINAL  Final  Culture, blood (routine x 2)     Status: None    Collection Time: 03/25/18 12:44 AM  Result Value Ref Range Status   Specimen Description BLOOD LEFT HAND  Final   Special Requests   Final    BOTTLES DRAWN AEROBIC ONLY Blood Culture adequate volume   Culture   Final    NO GROWTH 5 DAYS Performed at [facility], [address] [place], [place] [address]    Report  Status 03/30/2018 FINAL  Final  MRSA PCR Screening     Status: None   Collection Time: 03/25/18  1:14 AM  Result Value Ref Range Status   MRSA by PCR NEGATIVE NEGATIVE Final    Comment:        The GeneXpert MRSA Assay (FDA approved for NASAL specimens only), is one component of a comprehensive MRSA colonization surveillance program. It is not intended to diagnose MRSA infection nor to guide or monitor treatment for MRSA infections. Performed at [facility], [address], [place], [place] [address]   Culture, respiratory (non-expectorated)     Status: None   Collection Time: 03/25/18  2:51 AM  Result Value Ref Range Status   Specimen Description TRACHEAL ASPIRATE  Final   Special Requests NONE  Final   Gram Stain   Final    RARE WBC PRESENT, PREDOMINANTLY PMN FEW GRAM POSITIVE COCCI IN PAIRS IN CLUSTERS RARE YEAST    Culture   Final    FEW Consistent with normal respiratory flora. Performed at [facility], [address] [place], [place] [address]    Report Status 03/27/2018 FINAL  Final     Scheduled Meds: . chlorhexidine  15 mL Mouth Rinse BID  . heparin injection (subcutaneous)  5,000 Units Subcutaneous Q8H  . insulin aspart  0-9 Units Subcutaneous Q4H  . mouth rinse  15 mL Mouth Rinse q12n4p  . pantoprazole (PROTONIX) IV  40 mg Intravenous QHS  . potassium chloride  20 mEq Oral Daily     LOS: 6 days   Lonia Blood, MD [facility] Office  [phone] Pager - Text Page per Amion  If 7PM-7AM, please contact night-coverage per Amion 03/30/2018, 5:35 PM

## 2018-03-30 NOTE — Consult Note (Signed)
Physical Medicine and Rehabilitation Consult   Reason for Consult: Debility Referring Physician: Dr. Sharon Seller   HPI: Kevin Rojas is a 63 y.o. male with history of LBBB, renal calculi, recent URI symptoms who was admitted from home on 03/24/2018 with V. fib cardiac arrest.  He was treated with CPR x3 minutes by family as well as defibrillation by EMS. He was intubated and treated with cooling protocol. He was started on Unasyn for aspiration PNA. 2D echo showed EF 45-50% with severe AI as well as concerns of type A aortic dissection.  CTA chest negative for PE, dissection or fluid overload.  EKG without acute changes but cardiac enzymes positive due to NSTEMI felt to be due to demand ischemia.  Cardiology recommends cath pending recovery.  CT head negative for acute changes and EEG showed generalized slowing. He tolerated extubation on 03/28/18 and developed ileus with bilious emesis therefore NGT placed. Therapy evaluations revealed debility and CIR recommended due to functional decline.    Review of Systems  Constitutional: Negative for chills and fever.  HENT: Negative for hearing loss and tinnitus.   Eyes: Negative for blurred vision and double vision.  Respiratory: Negative for cough and shortness of breath.   Cardiovascular: Negative for chest pain and palpitations.  Gastrointestinal: Positive for heartburn and nausea. Negative for abdominal pain.  Genitourinary: Negative for dysuria and urgency.  Musculoskeletal: Negative for joint pain and myalgias.  Skin: Negative for rash.     History reviewed. No pertinent past medical history.    History reviewed. No pertinent surgical history.    Family History  Problem Relation Age of Onset   Pulmonary embolism Mother    Prostate cancer Father        passed awary at 52      Social History:  Married. Works as a Programmer, multimedia and drives a Writer. He reports that he has never smoked. He has never used smokeless tobacco. He  reports previous alcohol use. He reports previous drug use.    Allergies: No Known Allergies    Medications Prior to Admission  Medication Sig Dispense Refill   acetaminophen (TYLENOL) 500 MG tablet Take 1,000 mg by mouth every 6 (six) hours as needed for mild pain.     HYDROcodone-acetaminophen (NORCO/VICODIN) 5-325 MG tablet Take 1 tablet by mouth every 6 (six) hours as needed for severe pain. (Patient not taking: Reported on 03/25/2018) 10 tablet 0   ondansetron (ZOFRAN ODT) 8 MG disintegrating tablet  ODT q4 hours prn nausea (Patient not taking: Reported on 03/25/2018) 4 tablet 0    Home: Home Living Family/patient expects to be discharged to:: Private residence Living Arrangements: Spouse/significant other Available Help at Discharge: Family, Available 24 hours/day Type of Home: House Home Access: Stairs to enter Secretary/administrator of Steps: 5+2 Entrance Stairs-Rails: Right Home Layout: One level Bathroom Shower/Tub: Engineer, manufacturing systems: Standard Home Equipment: None Additional Comments: has access to a walker  Functional History: Prior Function Level of Independence: Independent Comments: drives, concrete truck driver Functional Status:  Mobility: Bed Mobility Overal bed mobility: Needs Assistance Bed Mobility: Supine to Sit, Sit to Supine Supine to sit: Min assist Sit to supine: Mod assist General bed mobility comments: increased time, min assist to position hips at EOB, assist for LEs back into bed  Transfers Overall transfer level: Needs assistance Equipment used: 2 person hand held assist Transfers: Sit to/from Stand Sit to Stand: +2 physical assistance, Min assist General transfer comment: assist to rise  and stedy, pt able to take several side steps toward HOB, flexed posture Ambulation/Gait Ambulation/Gait assistance: Min assist, +2 physical assistance Gait Distance (Feet): 3 Feet General Gait Details: side steps to [facility]     ADL: ADL Overall ADL's : Needs assistance/impaired Eating/Feeding: NPO Grooming: Minimal assistance, Sitting Upper Body Bathing: Sitting, Maximal assistance Lower Body Bathing: Maximal assistance, Sit to/from stand, +2 for physical assistance Upper Body Dressing : Moderate assistance, Sitting Lower Body Dressing: +2 for physical assistance, Sit to/from stand, Maximal assistance Toilet Transfer: +2 for physical assistance, Minimal assistance Toileting- Clothing Manipulation and Hygiene: +2 for physical assistance, Total assistance, Sit to/from stand  Cognition: Cognition Overall Cognitive Status: Within Functional Limits for tasks assessed Orientation Level: Oriented to person, Oriented to place Cognition Arousal/Alertness: Awake/alert Behavior During Therapy: Flat affect Overall Cognitive Status: Within Functional Limits for tasks assessed General Comments: WFL for tasks assessed this visit, needs ongoing assessment, minimally verbal during session   Blood pressure 136/78, pulse 81, temperature 99.7 F (37.6 C), temperature source Oral, resp. rate 19, height 5\' 10"  (1.778 m), weight 66.9 kg, SpO2 93 %. Physical Exam  Nursing note and vitals reviewed. Constitutional: He is oriented to person, place, and time. He appears well-developed and well-nourished.  Frail appearing  HENT:  Head: Normocephalic and atraumatic.  NGT  Eyes: Pupils are equal, round, and reactive to light.  Neck: No thyromegaly present.  Cardiovascular: Normal rate.  Respiratory: Effort normal. No stridor. No respiratory distress. He has no wheezes.  Sl dyspnea with conversation  GI: Soft.     Neurological: He is alert and oriented to person, place, and time.  Pt sitting in chair, has difficulty with head control. UE motor 3+ to 4/5 prox to distal. LE: 3/5 prox to 4- distal. No focal sensory deficits. Displays good insight and awareness. Speech clear.       Skin: Skin is warm.  Psychiatric: He has a  normal mood and affect. His behavior is normal.    Results for orders placed or performed during the hospital encounter of 03/24/18 (from the past 24 hour(s))  Glucose, capillary     Status: Abnormal   Collection Time: 03/29/18 11:53 AM  Result Value Ref Range   Glucose-Capillary 104 (H) 70 - 99 mg/dL  Glucose, capillary     Status: Abnormal   Collection Time: 03/29/18  3:26 PM  Result Value Ref Range   Glucose-Capillary 108 (H) 70 - 99 mg/dL  Glucose, capillary     Status: Abnormal   Collection Time: 03/30/18 12:15 AM  Result Value Ref Range   Glucose-Capillary 104 (H) 70 - 99 mg/dL  Renal function panel     Status: Abnormal   Collection Time: 03/30/18  3:44 AM  Result Value Ref Range   Sodium 139 135 - 145 mmol/L   Potassium 3.8 3.5 - 5.1 mmol/L   Chloride 105 98 - 111 mmol/L   CO2 23 22 - 32 mmol/L   Glucose, Bld 105 (H) 70 - 99 mg/dL   BUN 24 (H) 8 - 23 mg/dL   Creatinine, Ser 2.33 (H) 0.61 - 1.24 mg/dL   Calcium 9.1 8.9 - 61.2 mg/dL   Phosphorus 3.0 2.5 - 4.6 mg/dL   Albumin 3.0 (L) 3.5 - 5.0 g/dL   GFR calc non Af Amer 56 (L) >60 mL/min   GFR calc Af Amer >60 >60 mL/min   Anion gap 11 5 - 15  Magnesium     Status: None   Collection Time: 03/30/18  3:44 AM  Result Value Ref Range   Magnesium 2.3 1.7 - 2.4 mg/dL  CBC with Differential/Platelet     Status: Abnormal   Collection Time: 03/30/18  3:44 AM  Result Value Ref Range   WBC 13.3 (H) 4.0 - 10.5 K/uL   RBC 4.89 4.22 - 5.81 MIL/uL   Hemoglobin 13.6 13.0 - 17.0 g/dL   HCT 09.7 35.3 - 29.9 %   MCV 83.6 80.0 - 100.0 fL   MCH 27.8 26.0 - 34.0 pg   MCHC 33.3 30.0 - 36.0 g/dL   RDW 24.2 68.3 - 41.9 %   Platelets 219 150 - 400 K/uL   nRBC 0.0 0.0 - 0.2 %   Neutrophils Relative % 78 %   Neutro Abs 10.4 (H) 1.7 - 7.7 K/uL   Lymphocytes Relative 11 %   Lymphs Abs 1.4 0.7 - 4.0 K/uL   Monocytes Relative 10 %   Monocytes Absolute 1.3 (H) 0.1 - 1.0 K/uL   Eosinophils Relative 0 %   Eosinophils Absolute 0.0 0.0 - 0.5  K/uL   Basophils Relative 0 %   Basophils Absolute 0.0 0.0 - 0.1 K/uL   Immature Granulocytes 1 %   Abs Immature Granulocytes 0.10 (H) 0.00 - 0.07 K/uL  Glucose, capillary     Status: Abnormal   Collection Time: 03/30/18  3:56 AM  Result Value Ref Range   Glucose-Capillary 114 (H) 70 - 99 mg/dL  Glucose, capillary     Status: Abnormal   Collection Time: 03/30/18  7:49 AM  Result Value Ref Range   Glucose-Capillary 104 (H) 70 - 99 mg/dL   Dg Abd Portable 1v  Result Date: 03/29/2018 CLINICAL DATA:  Nasogastric tube placement EXAM: PORTABLE ABDOMEN - 1 VIEW COMPARISON:  Portable exam 0048 hours compared to CT abdomen and pelvis of 12/31/2017 FINDINGS: Nasogastric tube coiled in mid stomach with tip projecting over distal antrum. Air-filled loops of large and small bowel in mid abdomen with scattered stool in colon. Catheter versus thermal probe coiled in pelvis. LEFT lower lobe consolidation. Bones unremarkable. IMPRESSION: Nasogastric tube coiled in mid stomach with tip projecting over distal gastric antrum. Electronically Signed   By: Ulyses Southward M.D.   On: 03/29/2018 01:09     Assessment/Plan: Diagnosis: 63 year old male with severe debility related to v-fib cardiac arrest and pneumonia 1. Does the need for close, 24 hr/day medical supervision in concert with the patient's rehab needs make it unreasonable for this patient to be served in a less intensive setting? Yes 2. Co-Morbidities requiring supervision/potential complications: NSTEMI, dysphagia/nutrition 3. Due to bladder management, bowel management, safety, skin/wound care, disease management, medication administration, pain management and patient education, does the patient require 24 hr/day rehab nursing? Yes 4. Does the patient require coordinated care of a physician, rehab nurse, PT (1-2 hrs/day, 5 days/week), OT (1-2 hrs/day, 5 days/week) and SLP (1-2 hrs/day, 5 days/week) to address physical and functional deficits in the context  of the above medical diagnosis(es)? Yes Addressing deficits in the following areas: balance, endurance, locomotion, strength, transferring, bowel/bladder control, bathing, dressing, feeding, grooming, toileting, swallowing and psychosocial support 5. Can the patient actively participate in an intensive therapy program of at least 3 hrs of therapy per day at least 5 days per week? Yes 6. The potential for patient to make measurable gains while on inpatient rehab is good 7. Anticipated functional outcomes upon discharge from inpatient rehab are modified independent  with PT, modified independent with OT, modified independent with SLP.  8. Estimated rehab length of stay to reach the above functional goals is: 11-17 days 9. Anticipated D/C setting: Home 10. Anticipated post D/C treatments: HH therapy 11. Overall Rehab/Functional Prognosis: excellent  RECOMMENDATIONS: This patient's condition is appropriate for continued rehabilitative care in the following setting: CIR Patient has agreed to participate in recommended program. Yes Note that insurance prior authorization may be required for reimbursement for recommended care.  Comment: Rehab Admissions Coordinator to follow up.  Thanks,  Ranelle Oyster, MD, [place] [facility]  I have personally performed a face to face diagnostic evaluation of this patient. Additionally, I have examined pertinent labs and radiographic images. I have reviewed and concur with the physician assistant's documentation above.    Jacquelynn Cree, PA-C 03/30/2018

## 2018-03-31 LAB — COMPREHENSIVE METABOLIC PANEL
ALT: 40 U/L (ref 0–44)
AST: 23 U/L (ref 15–41)
Albumin: 2.8 g/dL — ABNORMAL LOW (ref 3.5–5.0)
Alkaline Phosphatase: 70 U/L (ref 38–126)
Anion gap: 10 (ref 5–15)
BUN: 22 mg/dL (ref 8–23)
CO2: 23 mmol/L (ref 22–32)
Calcium: 8.9 mg/dL (ref 8.9–10.3)
Chloride: 108 mmol/L (ref 98–111)
Creatinine, Ser: 1.41 mg/dL — ABNORMAL HIGH (ref 0.61–1.24)
GFR calc Af Amer: >60 mL/min (ref >60)
GFR calc non Af Amer: 53 mL/min — ABNORMAL LOW (ref >60)
Glucose, Bld: 104 mg/dL — ABNORMAL HIGH (ref 70–99)
POTASSIUM: 3.8 mmol/L (ref 3.5–5.1)
Sodium: 141 mmol/L (ref 135–145)
Total Bilirubin: 1.4 mg/dL — ABNORMAL HIGH (ref 0.3–1.2)
Total Protein: 7.2 g/dL (ref 6.5–8.1)

## 2018-03-31 LAB — GLUCOSE, CAPILLARY
GLUCOSE-CAPILLARY: 112 mg/dL — AB (ref 70–99)
GLUCOSE-CAPILLARY: 75 mg/dL (ref 70–99)
Glucose-Capillary: 77 mg/dL (ref 70–99)
Glucose-Capillary: 89 mg/dL (ref 70–99)
Glucose-Capillary: 112 mg/dL — ABNORMAL HIGH (ref 70–99)
Glucose-Capillary: 96 mg/dL (ref 70–99)
Glucose-Capillary: 107 mg/dL — ABNORMAL HIGH (ref 70–99)
Glucose-Capillary: 93 mg/dL (ref 70–99)
Glucose-Capillary: 84 mg/dL (ref 70–99)

## 2018-03-31 LAB — CBC
HCT: 41.3 % (ref 39.0–52.0)
HEMOGLOBIN: 13.2 g/dL (ref 13.0–17.0)
MCH: 26.9 pg (ref 26.0–34.0)
MCHC: 32 g/dL (ref 30.0–36.0)
MCV: 84.3 fL (ref 80.0–100.0)
Platelets: 242 10*3/uL (ref 150–400)
RBC: 4.9 MIL/uL (ref 4.22–5.81)
RDW: 14.3 % (ref 11.5–15.5)
WBC: 11.9 10*3/uL — ABNORMAL HIGH (ref 4.0–10.5)
nRBC: 0 % (ref 0.0–0.2)

## 2018-03-31 LAB — MAGNESIUM: Magnesium: 2.3 mg/dL (ref 1.7–2.4)

## 2018-03-31 MED ORDER — PANTOPRAZOLE SODIUM 40 MG PO TBEC
40.0000 mg | DELAYED_RELEASE_TABLET | Freq: Every day | ORAL | Status: DC
  Administered 2018-04-01 – 2018-04-05 (×5): 40 mg via ORAL
  Filled 2018-03-31 (×6): qty 1

## 2018-03-31 MED ORDER — SODIUM CHLORIDE 0.9 % IV SOLN: 3.0000 g | Freq: Four times a day (QID) | INTRAVENOUS | Status: DC

## 2018-03-31 MED ORDER — ENSURE ENLIVE PO LIQD
237.0000 mL | Freq: Two times a day (BID) | ORAL | Status: DC
  Administered 2018-03-31 – 2018-04-05 (×8): 237 mL via ORAL

## 2018-03-31 NOTE — Progress Notes (Signed)
Physical Therapy Treatment Patient Details Name: Kevin Rojas MRN: [id number] DOB: 07-14-1955 Today's Date: 03/31/2018    History of Present Illness Pt is a 63 year old man admitted 3/17 with V fib arrest, + CPR. ETT 3/17-3/21. Hospital course complicated by ongoing vomiting, NGT placed 3/21. PMH: chronic L bundle branch block.     PT Comments    Patient making good progress towards all goals. Patient very motivated to participate with therapy. Patient continues to require Min A for transfers and mobility with unsteadiness noted throughout session. Able to improve stepping pattern today with less scissoring gait noted. VSS throughout session with no subjective complaints. Will continue to recommend CIR at discharge.     Follow Up Recommendations  CIR     Equipment Recommendations  (defer)    Recommendations for Other Services Rehab consult     Precautions / Restrictions Precautions Precautions: Fall Restrictions Weight Bearing Restrictions: No    Mobility  Bed Mobility               General bed mobility comments: up in recliner  Transfers Overall transfer level: Needs assistance Equipment used: 1 person hand held assist Transfers: Sit to/from Stand;Stand Pivot Transfers Sit to Stand: Min assist Stand pivot transfers: Min assist       General transfer comment: Min A to stand at recliner; Min A for steadying and overall balance  Ambulation/Gait Ambulation/Gait assistance: Editor, commissioning (Feet): 80 Feet(x2) Assistive device: 1 person hand held assist;IV Pole Gait Pattern/deviations: Step-to pattern;Step-through pattern;Decreased stride length;Trunk flexed;Drifts right/left Gait velocity: decreased   General Gait Details: use of 1 HHA and IV pole for mobility; continued mild unsteadiness throughout; much improved BOS today with less scissoring noted; HR up to 110's and SpO2 on RA >90% throughout mobility   Stairs             Wheelchair  Mobility    Modified Rankin (Stroke Patients Only)       Balance Overall balance assessment: Needs assistance Sitting-balance support: Single extremity supported;Feet supported Sitting balance-Leahy Scale: Fair Sitting balance - Comments: reduced head and trunk control   Standing balance support: Bilateral upper extremity supported;During functional activity Standing balance-Leahy Scale: Poor                              Cognition Arousal/Alertness: Awake/alert Behavior During Therapy: WFL for tasks assessed/performed Overall Cognitive Status: Within Functional Limits for tasks assessed                                 General Comments: able to recal past event and friends and make appropriate conversation during session      Exercises      General Comments General comments (skin integrity, edema, etc.): very motivated; VSS      Pertinent Vitals/Pain Pain Assessment: No/denies pain Faces Pain Scale: No hurt    Home Living                      Prior Function            PT Goals (current goals can now be found in the care plan section) Acute Rehab PT Goals Patient Stated Goal: to return to PLOF PT Goal Formulation: With patient/family Time For Goal Achievement: 04/12/18 Potential to Achieve Goals: Good Progress towards PT goals: Progressing toward goals    Frequency  Min 3X/week      PT Plan Current plan remains appropriate    Co-evaluation              AM-PAC PT "6 Clicks" Mobility   Outcome Measure  Help needed turning from your back to your side while in a flat bed without using bedrails?: A Little Help needed moving from lying on your back to sitting on the side of a flat bed without using bedrails?: A Little Help needed moving to and from a bed to a chair (including a wheelchair)?: A Little Help needed standing up from a chair using your arms (e.g., wheelchair or bedside chair)?: A Little Help needed to  walk in hospital room?: A Little Help needed climbing 3-5 steps with a railing? : A Lot 6 Click Score: 17    End of Session Equipment Utilized During Treatment: Gait belt Activity Tolerance: Patient tolerated treatment well Patient left: in chair;with call bell/phone within reach;with chair alarm set Nurse Communication: Mobility status PT Visit Diagnosis: Unsteadiness on feet (R26.81);Difficulty in walking, not elsewhere classified (R26.2);Muscle weakness (generalized) (M62.81)     Time: 2595-6387 PT Time Calculation (min) (ACUTE ONLY): 24 min  Charges:  $Gait Training: 8-22 mins $Therapeutic Activity: 8-22 mins                      Kipp Laurence, PT, DPT Supplemental Physical Therapist 03/31/18 2:23 PM Pager: [phone] Office: [phone]

## 2018-03-31 NOTE — Progress Notes (Signed)
Inpatient Rehabilitation Admissions Coordinator  I met with patient at bedside. He requests that I contact his son, Treven, by phone. I contacted [facility] to discuss a possible inpt rehab admit before pt discharges home. Urban is a Physical Therapist. Dacen to discuss with his Step Mom and get back to me with their preference for rehab venue. Pt awake and interactive, face timing family via lap top. I await cardiology plan of care to proceed.  Danne Baxter, RN, MSN Rehab Admissions Coordinator [phone] 03/31/2018 2:53 PM

## 2018-03-31 NOTE — Progress Notes (Signed)
OT Cancellation Note  Patient Details Name: Kevin Rojas MRN: [id number] DOB: 1955-02-24   Cancelled Treatment:    Reason Eval/Treat Not Completed: Patient declined. Pt up in chair and attempting to face time with family members. Will return tomorrow for OT treatment.    Cipriano Mile OTR/L Acute Rehabilitation Services [phone] 03/31/2018, 3:37 PM

## 2018-03-31 NOTE — Progress Notes (Signed)
  Speech Language Pathology Treatment: Dysphagia  Patient Details Name: Kevin Rojas MRN: [id number] DOB: 20-Jun-1955 Today's Date: 03/31/2018 Time: 8299-3716 SLP Time Calculation (min) (ACUTE ONLY): 11 min  Assessment / Plan / Recommendation Clinical Impression  Pt consumed purees and thin liquids with consistent second swallows noted regardless of consistency, but no overt signs of aspiration even with large, consecutive straw sips. Of note, pt was repetitive throughout session and required Min-Mod cues for sustained attention, initiation. MD may wish to consider ordering SLP cognitive evaluation. For now, will start with full liquid diet as cleared by MD. Will f/u for tolerance and progression.   HPI HPI: 63 year old man admitted 3/17 with V. fib arrest from home.  He has a history of chronic left bundle branch block. Underwent hypothermia protocol; vomited around [facility] airway. Intubated in ER 3/17-3/21.  NG placed 3/21 for stomach decompression due to ongoing vomiting.        SLP Plan  Continue with current plan of care       Recommendations  Diet recommendations: Thin liquid;Other(comment)(full liquid diet) Liquids provided via: Cup;Straw Medication Administration: Whole meds with liquid Supervision: Patient able to self feed;Intermittent supervision to cue for compensatory strategies Compensations: Slow rate;Small sips/bites;Minimize environmental distractions Postural Changes and/or Swallow Maneuvers: Seated upright 90 degrees;Upright 30-60 min after meal                Oral Care Recommendations: Oral care BID Follow up Recommendations: None SLP Visit Diagnosis: Dysphagia, unspecified (R13.10) Plan: Continue with current plan of care       GO                Kevin Rojas Train 03/31/2018, 11:07 AM  Kevin Rojas, M.A. CCC-SLP Acute Herbalist [phone] Office [phone]

## 2018-03-31 NOTE — Progress Notes (Signed)
Nutrition Follow-up RD working remotely.  DOCUMENTATION CODES:   Not applicable  INTERVENTION:    Ensure Enlive po BID, each supplement provides 350 kcal and 20 grams of protein  NUTRITION DIAGNOSIS:   Inadequate oral intake related to altered GI function as evidenced by other (comment)(diet just advanced to full liquids).  Ongoing  GOAL:   Patient will meet greater than or equal to 90% of their needs  Unmet currently  MONITOR:   PO intake, Supplement acceptance, Labs, Skin  ASSESSMENT:   Pt is a 56y M from home with PMH of kidney stones. Pt presented to ED with V.fib cardiac arrest, CPR started by wife. Hypothermia protocol started. Pt admitted for cardiac arrest.  Patient was extubated 3/21. NGT was placed 3/21 for decompression due to vomiting. Tube accidentally fell out this morning.   SLP following. Diet just advanced to full liquids. Doubt patient will be able to meet 100% of nutrition needs on full liquids; RD to order PO supplement.  Weight down 5 kg since admission I/O net negative 2.4 L  Cardiology following for V fib arrest, plans for cardiac cath later this week.  Diet Order:   Diet Order            Diet full liquid Room service appropriate? Yes; Fluid consistency: Thin  Diet effective now              EDUCATION NEEDS:   Not appropriate for education at this time  Skin:  Skin Assessment: Reviewed RN Assessment  Last BM:  3/24  Height:   Ht Readings from Last 1 Encounters:  03/24/18 5\' 10"  (1.778 m)    Weight:   Wt Readings from Last 1 Encounters:  03/31/18 65.2 kg    Ideal Body Weight:  75.45 kg  BMI:  Body mass index is 20.62 kg/m.  Estimated Nutritional Needs:   Kcal:  2000-2200  Protein:  90-110 gm  Fluid:  1.6-1.9 L    Joaquin Courts, RD, LDN, CNSC Pager [phone] After Hours Pager [phone]

## 2018-03-31 NOTE — Progress Notes (Addendum)
Inpatient Rehabilitation Admissions Coordinator  I was contacted by SW, Darl Pikes, to clarify timing of planned admit to CIR. I contacted Dr. Sharon Seller to clarify if cardiac cath to be done prior to d/c. I have to have clarity on timing before I can proceed with [employer] authorization for a possible inpt rehab admission. I await this clarification and then will proceed. Noted NGT removed and pt on full liquid diet. Await diet advancement/tolerance.  Ottie Glazier, RN, MSN Rehab Admissions Coordinator [phone] 03/31/2018 12:14 PM

## 2018-03-31 NOTE — Plan of Care (Signed)
  Problem: Education: Goal: Knowledge of General Education information will improve Description Including pain rating scale, medication(s)/side effects and non-pharmacologic comfort measures Outcome: Progressing   Problem: Health Behavior/Discharge Planning: Goal: Ability to manage health-related needs will improve Outcome: Progressing   Problem: Clinical Measurements: Goal: Ability to maintain clinical measurements within normal limits will improve Outcome: Progressing Goal: Will remain free from infection Outcome: Progressing   Problem: Clinical Measurements: Goal: Respiratory complications will improve Outcome: Completed/Met

## 2018-03-31 NOTE — Progress Notes (Signed)
Patient's son called, asking for Dr. Sharon Seller to call him regarding father's care and update.  Advised of current plan of care. PH. [phone].  Dr. Sharon Seller notified.

## 2018-03-31 NOTE — Progress Notes (Addendum)
The patient has been seen in conjunction with Micah Flesher, PA-C. All aspects of care have been considered and discussed. The patient has been personally interviewed, examined, and all clinical data has been reviewed.   NG tube is been removed.  Patient informed me that he has 2 biological sons and 2 adoptive sons.  He has 6 grandchildren.  His wife is had aortic valve surgery twice.  He understands what is involved with the process of valve re-placement.  Attempted to contact his son, Ewing, who seems to be the power of attorney.  He seems to be recovering quite well with recovering neurological function.  No overt evidence of volume overload or heart failure.  Plan left and right heart cath within the next 24 to 36 hours and then getting cardiac surgical team involved to set a game plan for future valve surgery.  Progress Note  Patient Name: Kevin Rojas Date of Encounter: 03/31/2018  Primary Cardiologist: Tonny Bollman, MD   Subjective   Pt resting in bed with NG tube. Alert and oriented.  Inpatient Medications    Scheduled Meds: . chlorhexidine  15 mL Mouth Rinse BID  . heparin injection (subcutaneous)  5,000 Units Subcutaneous Q8H  . insulin aspart  0-9 Units Subcutaneous Q4H  . mouth rinse  15 mL Mouth Rinse q12n4p  . pantoprazole (PROTONIX) IV  40 mg Intravenous QHS  . potassium chloride  20 mEq Oral Daily   Continuous Infusions: . dextrose 40 mL/hr at 03/30/18 2308   PRN Meds: acetaminophen, albuterol, hydrALAZINE, ondansetron (ZOFRAN) IV   Vital Signs    Vitals:   03/30/18 1621 03/30/18 2015 03/31/18 0009 03/31/18 0625  BP: (!) 146/77 (!) 153/78 (!) 144/80 135/77  Pulse: 83 80 91 80  Resp:  (!) 23 (!) 25 (!) 24  Temp:  99.7 F (37.6 C) 98.7 F (37.1 C) 99 F (37.2 C)  TempSrc: Oral Oral Axillary Oral  SpO2: 91% 95% 93% 96%  Weight:    65.2 kg  Height:        Intake/Output Summary (Last 24 hours) at 03/31/2018 0741 Last data filed at 03/31/2018  8185 Gross per 24 hour  Intake 776.52 ml  Output 650 ml  Net 126.52 ml   Last 3 Weights 03/31/2018 03/30/2018 03/28/2018  Weight (lbs) 143 lb 11.2 oz 147 lb 6.4 oz 162 lb 0.6 oz  Weight (kg) 65.182 kg 66.86 kg 73.5 kg      Telemetry    sinus - Personally Reviewed  ECG    No new tracings - Personally Reviewed  Physical Exam   GEN: No acute distress.   Neck: No JVD Cardiac: RRR, systolic murmur Respiratory: diminished in bases. GI: Soft, nontender, non-distended, NG tube in place MS: No edema; No deformity. Neuro:  Nonfocal  Psych: Normal affect   Labs    Chemistry Recent Labs  Lab 03/24/18 2058  03/29/18 0523 03/30/18 0344 03/31/18 0302  NA  --    < > 138 139 141  K  --    < > 4.2 3.8 3.8  CL  --    < > 104 105 108  CO2  --    < > 24 23 23   GLUCOSE  --    < > 106* 105* 104*  BUN  --    < > 24* 24* 22  CREATININE  --    < > 1.46* 1.35* 1.41*  CALCIUM  --    < > 8.9 9.1 8.9  PROT  6.7  --   --   --  7.2  ALBUMIN 3.4*  --   --  3.0* 2.8*  AST 250*  --   --   --  23  ALT 232*  --   --   --  40  ALKPHOS 68  --   --   --  70  BILITOT 1.1  --   --   --  1.4*  GFRNONAA  --    < > 51* 56* 53*  GFRAA  --    < > 59* >60 >60  ANIONGAP  --    < > 10 11 10    < > = values in this interval not displayed.     Hematology Recent Labs  Lab 03/29/18 0523 03/30/18 0344 03/31/18 0302  WBC 17.2* 13.3* 11.9*  RBC 4.61 4.89 4.90  HGB 12.8* 13.6 13.2  HCT 38.8* 40.9 41.3  MCV 84.2 83.6 84.3  MCH 27.8 27.8 26.9  MCHC 33.0 33.3 32.0  RDW 14.8 14.5 14.3  PLT 215 219 242    Cardiac Enzymes Recent Labs  Lab 03/24/18 2058 03/25/18 0048 03/25/18 0744 03/25/18 1334  TROPONINI 0.04* 2.13* 4.80* 2.35*    Recent Labs  Lab 03/24/18 2112  TROPIPOC 0.11*     BNPNo results for input(s): BNP, PROBNP in the last 168 hours.   DDimer No results for input(s): DDIMER in the last 168 hours.   Radiology    No results found.  Cardiac Studies   Echo 03/25/18 IMPRESSIONS   1. The left ventricle has mildly reduced systolic function, with an ejection fraction of 45-50%. The cavity size was mildly dilated. Left ventricular diastolic Doppler parameters are consistent with pseudonormalization. 2. The right ventricle has normal systolic function. The cavity was normal. There is no increase in right ventricular wall thickness. 3. Right atrial size was mildly dilated. 4. Trivial pericardial effusion is present. 5. Mild thickening of the mitral valve leaflet. 6. The aortic valveis bicuspid Aortic valve regurgitation is severe by color flow Doppler. 7. The aortic valve appears to be a bicuspid or functionally bicuspid AV. There is severe AI. The aortic root is mildly dilated. 8. There is mild dilatation of the ascending aorta. 9. The aortic valve appears to be a bicuspid or functionally bicuspid AV. There is severe AI. The aortic root is mildly dilated . There is concern for a Type A aortic dissection. Suggest STAT CT angiogram of the chest for further evaluation.  Patient Profile     63 y.o. male with ventricular fibrillatory arrest at home, in normal state of health no prior cardiac history sitting on couch wife went out for about 10 minutes came back found him unconscious. CPR.  Assessment & Plan    1. Vfib arrest - unknown etiology - awaiting improvement in his encephalopathy for definitive angiography to define anatomy - seems to be improving - anticipate possible heart cath later this week - echo with mild reduction in EF to 45% - chest is sore from compressions, but no chest pain   2. Severe aortic valve regurgitation, bicuspid valve - holding off on BB while NPO - will only use low dose BB given his valvular function   3. Newly diagnosed systolic heart failure - mildly reduced EF to 45% - he appears euvolemic   4. Left bundle branch block - documented in December   5. Elevated troponin - troponin 0.04 --> 2.13  --> 4.80 --> 2.35 - in the setting of arrest -  awaiting improvement in his encephalopathy before planning definitive angiography   6. Dilated aortic root - 4.4 cm on CT scan - thought to be due to bicuspid valve - nonsurgical at this time   7. AKI - sCr 1.41 (1.35) - appears stable across days -  Baseline may be 1.31   8. NG tube in place - per primary - denies flatus       For questions or updates, please contact [facility] Please consult [email or web address] for contact info under        Signed, Marcelino Duster, PA  03/31/2018, 7:41 AM

## 2018-03-31 NOTE — Progress Notes (Signed)
   03/31/18 0830  NG/OG Tube Nasogastric 12 Fr. Right nare Xray Measured external length of tube 39 cm  Placement Date/Time: 03/29/18 0015   Person Inserting Catheter: Claudean Severance, RN.  Tube Type: Nasogastric  Tube Size (Fr.): 12 Fr.  Tube Location: Right nare  Initial Placement Verification: Xray  Technique Used to Measure Tube Placement: Measured ex...  Site Assessment Clean;Dry;Intact  Ongoing Placement Verification  (NG tube removed)  Drainage Appearance Brown  Output (mL) 300 mL   The patient states to this RN "the tube fell out" dressing removed from nose at this time. Patient VSS. Notified MD McClung. Ordered placed for discontinue. I will continue to monitor the patient closely.   Sheppard Evens RN

## 2018-03-31 NOTE — Progress Notes (Signed)
[facility] TEAM 1 - Stepdown/ICU TEAM  Kevin Rojas  HEN:[id number] DOB: 1955-05-06 DOA: 03/24/2018 PCP: [name], MD    Brief Narrative:  [phone] w/ a hx of nephrolithiasis who presented from home with Vfib cardiac arrest on 3/17. CPR started by wife. Found in Vfib shocked twice and king airway placed. Vomited around [facility] airway. Intubated and stable in ER but not purposeful. Hypothermia protocol was utilized.   Subjective: Pt pulled out his NG tube this morning. He has been cleared for liquids per SLP.   Assessment & Plan:  V. fib arrest Etiology unclear - Cardiology is following - EF 45% on TTE - Cards to determine timing of cardiac cath   Bicuspid aortic valve with severe regurgitation To be followed by Cardiology   Newly diagnosed Acute systolic CHF EF 45% - no gross volume overload on exam today - hopeful for recovery   Chronic LBBB Noted in Dec 2019  Hypoxic encephalopathy post arrest Much improved today   Dilated Aortic root CT measures at 4.4cm - felt to be due to bicuspid valve   Coronavirus upper respiratory infection - NOT SARS-CoV2 asymptomatic at this time   Severe aortic valve regurgitation bicuspid aortic valve Possibility of surgery will hinge upon significant recovery of mental capacity (which presently appears to be occurring)  Possible aspiration pneumonia Has completed 7 days of Unasyn therapy - clincially stable - diet being advanced per SLP  Acute kidney injury Due to cardiogenic shock in the setting of V. fib arrest -creatinine may be settling into new baseline of ~1.4 - follow trend   DVT prophylaxis: Subcutaneous heparin Code Status: FULL CODE Family Communication: no family present at time of exam  Disposition Plan: stables for tele bed status - await decision on cardiac cath by Cards - if not cath planned this admit, pt is medically ready for CIR as of today   Consultants:  Cardiology  Antimicrobials:  Unasyn 3/17 > 3/23   Objective: Blood pressure 135/77, pulse 86, temperature 99 F (37.2 C), temperature source Oral, resp. rate 17, height 5\' 10"  (1.778 m), weight 65.2 kg, SpO2 95 %.  Intake/Output Summary (Last 24 hours) at 03/31/2018 1123 Last data filed at 03/31/2018 0830 Gross per 24 hour  Intake 876.52 ml  Output 950 ml  Net -73.48 ml   Filed Weights   03/28/18 0500 03/30/18 0345 03/31/18 0625  Weight: 73.5 kg 66.9 kg 65.2 kg    Examination: General: No acute respiratory distress Lungs: Clear to auscultation bilaterally without wheezes or crackles Cardiovascular: Regular rate and rhythm with gallup or rub  Abdomen: NT/ND, soft, bowel sounds positive, no rebound, no ascites, no appreciable mass Extremities: No significant cyanosis, clubbing, or edema bilateral lower extremities  CBC: Recent Labs  Lab 03/24/18 2130  03/29/18 0523 03/30/18 0344 03/31/18 0302  WBC 10.5   < > 17.2* 13.3* 11.9*  NEUTROABS 6.8  --   --  10.4*  --   HGB 14.1   < > 12.8* 13.6 13.2  HCT 45.2   < > 38.8* 40.9 41.3  MCV 87.6   < > 84.2 83.6 84.3  PLT 224   < > 215 219 242   < > = values in this interval not displayed.   Basic Metabolic Panel: Recent Labs  Lab 03/27/18 0426  03/28/18 0443 03/29/18 0523 03/30/18 0344 03/31/18 0302  NA 141   < > 137 138 139 141  K 4.1   < > 4.9 4.2 3.8 3.8  CL 119*  --  110 104 105 108  CO2 16*  --  18* 24 23 23   GLUCOSE 115*  --  103* 106* 105* 104*  BUN 10  --  15 24* 24* 22  CREATININE 1.35*  --  1.23 1.46* 1.35* 1.41*  CALCIUM 8.0*  --  8.9 8.9 9.1 8.9  MG 2.0  --  2.0  --  2.3 2.3  PHOS 2.8  --  5.4*  --  3.0  --    < > = values in this interval not displayed.   GFR: Estimated Creatinine Clearance: 50.1 mL/min (A) (by C-G formula based on SCr of 1.41 mg/dL (H)).  Liver Function Tests: Recent Labs  Lab 03/24/18 2058 03/30/18 0344 03/31/18 0302  AST 250*  --  23  ALT 232*  --  40  ALKPHOS 68  --  70  BILITOT 1.1  --  1.4*  PROT 6.7  --  7.2  ALBUMIN 3.4*  3.0* 2.8*    Coagulation Profile: Recent Labs  Lab 03/24/18 2019 03/25/18 0252 03/25/18 1100  INR 1.2 1.3* 1.3*    Cardiac Enzymes: Recent Labs  Lab 03/24/18 2058 03/25/18 0048 03/25/18 0744 03/25/18 1334  TROPONINI 0.04* 2.13* 4.80* 2.35*    HbA1C: Hgb A1c MFr Bld  Date/Time Value Ref Range Status  03/24/2018 09:30 PM 5.3 4.8 - 5.6 % Final    Comment:    (NOTE) Pre diabetes:          5.7%-6.4% Diabetes:              >6.4% Glycemic control for   <7.0% adults with diabetes     CBG: Recent Labs  Lab 03/30/18 1603 03/30/18 2012 03/31/18 0005 03/31/18 0406 03/31/18 0739  GLUCAP 106* 98 75 96 107*    Recent Results (from the past 240 hour(s))  Respiratory Panel by PCR     Status: Abnormal   Collection Time: 03/24/18 10:07 PM  Result Value Ref Range Status   Adenovirus NOT DETECTED NOT DETECTED Final   Coronavirus 229E NOT DETECTED NOT DETECTED Final    Comment: (NOTE) The Coronavirus on the Respiratory Panel, DOES NOT test for the novel  Coronavirus (2019 nCoV)    Coronavirus HKU1 NOT DETECTED NOT DETECTED Final   Coronavirus NL63 DETECTED (A) NOT DETECTED Final   Coronavirus OC43 NOT DETECTED NOT DETECTED Final   Metapneumovirus NOT DETECTED NOT DETECTED Final   Rhinovirus / Enterovirus NOT DETECTED NOT DETECTED Final   Influenza A NOT DETECTED NOT DETECTED Final   Influenza B NOT DETECTED NOT DETECTED Final   Parainfluenza Virus 1 NOT DETECTED NOT DETECTED Final   Parainfluenza Virus 2 NOT DETECTED NOT DETECTED Final   Parainfluenza Virus 3 NOT DETECTED NOT DETECTED Final   Parainfluenza Virus 4 NOT DETECTED NOT DETECTED Final   Respiratory Syncytial Virus NOT DETECTED NOT DETECTED Final   Bordetella pertussis NOT DETECTED NOT DETECTED Final   Chlamydophila pneumoniae NOT DETECTED NOT DETECTED Final   Mycoplasma pneumoniae NOT DETECTED NOT DETECTED Final    Comment: Performed at [facility], [address], [place], [place] [address]   Culture, blood (routine x 2)     Status: None   Collection Time: 03/25/18 12:42 AM  Result Value Ref Range Status   Specimen Description BLOOD LEFT A-LINE  Final   Special Requests   Final    BOTTLES DRAWN AEROBIC AND ANAEROBIC Blood Culture results may not be optimal due to an excessive volume of blood received in culture bottles  Culture   Final    NO GROWTH 5 DAYS Performed at [facility], [address] [place], [place] [address]    Report Status 03/30/2018 FINAL  Final  Culture, blood (routine x 2)     Status: None   Collection Time: 03/25/18 12:44 AM  Result Value Ref Range Status   Specimen Description BLOOD LEFT HAND  Final   Special Requests   Final    BOTTLES DRAWN AEROBIC ONLY Blood Culture adequate volume   Culture   Final    NO GROWTH 5 DAYS Performed at [facility], [address], [place], [place] [address]    Report Status 03/30/2018 FINAL  Final  MRSA PCR Screening     Status: None   Collection Time: 03/25/18  1:14 AM  Result Value Ref Range Status   MRSA by PCR NEGATIVE NEGATIVE Final    Comment:        The GeneXpert MRSA Assay (FDA approved for NASAL specimens only), is one component of a comprehensive MRSA colonization surveillance program. It is not intended to diagnose MRSA infection nor to guide or monitor treatment for MRSA infections. Performed at [facility], [address], [place], [place] [address]   Culture, respiratory (non-expectorated)     Status: None   Collection Time: 03/25/18  2:51 AM  Result Value Ref Range Status   Specimen Description TRACHEAL ASPIRATE  Final   Special Requests NONE  Final   Gram Stain   Final    RARE WBC PRESENT, PREDOMINANTLY PMN FEW GRAM POSITIVE COCCI IN PAIRS IN CLUSTERS RARE YEAST    Culture   Final    FEW Consistent with normal respiratory flora. Performed at [facility], [address], [place], [place] [address]    Report Status 03/27/2018 FINAL  Final     Scheduled  Meds: . chlorhexidine  15 mL Mouth Rinse BID  . heparin injection (subcutaneous)  5,000 Units Subcutaneous Q8H  . insulin aspart  0-9 Units Subcutaneous Q4H  . mouth rinse  15 mL Mouth Rinse q12n4p  . pantoprazole (PROTONIX) IV  40 mg Intravenous QHS  . potassium chloride  20 mEq Oral Daily     LOS: 7 days   Lonia Blood, MD [facility] Office  [phone] Pager - Text Page per Amion  If 7PM-7AM, please contact night-coverage per Amion 03/31/2018, 11:23 AM

## 2018-04-01 LAB — BASIC METABOLIC PANEL
Anion gap: 10 (ref 5–15)
BUN: 20 mg/dL (ref 8–23)
CHLORIDE: 106 mmol/L (ref 98–111)
CO2: 24 mmol/L (ref 22–32)
Calcium: 8.8 mg/dL — ABNORMAL LOW (ref 8.9–10.3)
Creatinine, Ser: 1.4 mg/dL — ABNORMAL HIGH (ref 0.61–1.24)
GFR calc Af Amer: >60 mL/min (ref >60)
GFR calc non Af Amer: 53 mL/min — ABNORMAL LOW (ref >60)
Glucose, Bld: 85 mg/dL (ref 70–99)
POTASSIUM: 3.8 mmol/L (ref 3.5–5.1)
Sodium: 140 mmol/L (ref 135–145)

## 2018-04-01 LAB — GLUCOSE, CAPILLARY
Glucose-Capillary: 103 mg/dL — ABNORMAL HIGH (ref 70–99)
Glucose-Capillary: 98 mg/dL (ref 70–99)
Glucose-Capillary: 100 mg/dL — ABNORMAL HIGH (ref 70–99)
Glucose-Capillary: 97 mg/dL (ref 70–99)

## 2018-04-01 LAB — CBC
HCT: 42.6 % (ref 39.0–52.0)
Hemoglobin: 13.9 g/dL (ref 13.0–17.0)
MCH: 27.6 pg (ref 26.0–34.0)
MCHC: 32.6 g/dL (ref 30.0–36.0)
MCV: 84.7 fL (ref 80.0–100.0)
Platelets: 240 10*3/uL (ref 150–400)
RBC: 5.03 MIL/uL (ref 4.22–5.81)
RDW: 14.1 % (ref 11.5–15.5)
WBC: 11.5 10*3/uL — ABNORMAL HIGH (ref 4.0–10.5)
nRBC: 0 % (ref 0.0–0.2)

## 2018-04-01 LAB — MAGNESIUM: Magnesium: 2.2 mg/dL (ref 1.7–2.4)

## 2018-04-01 MED ORDER — SODIUM CHLORIDE 0.9 % WEIGHT BASED INFUSION
1.0000 mL/kg/h | INTRAVENOUS | Status: DC
  Administered 2018-04-01: 1 mL/kg/h via INTRAVENOUS

## 2018-04-01 MED ORDER — ATORVASTATIN CALCIUM 80 MG PO TABS: 80.0000 mg | ORAL_TABLET | ORAL | Status: DC

## 2018-04-01 MED ORDER — ATORVASTATIN CALCIUM 80 MG PO TABS
80.0000 mg | ORAL_TABLET | Freq: Every day | ORAL | Status: DC
  Administered 2018-04-02: 80 mg via ORAL
  Filled 2018-04-01: qty 1

## 2018-04-01 MED ORDER — ASPIRIN 81 MG PO CHEW
81.0000 mg | CHEWABLE_TABLET | ORAL | Status: AC
  Administered 2018-04-02: 81 mg via ORAL
  Filled 2018-04-01: qty 1

## 2018-04-01 MED ORDER — SODIUM CHLORIDE 0.9 % IV SOLN: 250.0000 mL | INTRAVENOUS | Status: DC | PRN

## 2018-04-01 MED ORDER — SODIUM CHLORIDE 0.9% FLUSH
3.0000 mL | Freq: Two times a day (BID) | INTRAVENOUS | Status: DC
  Administered 2018-04-01: 3 mL via INTRAVENOUS

## 2018-04-01 MED ORDER — SODIUM CHLORIDE 0.9% FLUSH: 3.0000 mL | INTRAVENOUS | Status: DC | PRN

## 2018-04-01 NOTE — Progress Notes (Signed)
PROGRESS NOTE    Kevin Rojas  VFI:[id number] DOB: 1955/07/13 DOA: 03/24/2018 PCP: [name], MD     Brief Narrative:  Kevin Rojas is a 63 year old male with past medical history significant for nephrolithiasis.  Patient presented from home with Vfib cardiac arrest on 3/17. CPR started by wife. Found in Vfib shocked twice and king airway placed. Vomited around [facility] airway. Intubated and stable in ER but not purposeful. Hypothermia protocol started. EKG noted for LBBB. PCCM admitted the patient.  Patient has been extubated.  Patient was transferred to the hospitalist team on 03/29/2018.  Problems include encephalopathy that is resolving, possible aspiration pneumonia, decreased ejection fraction that could be related to the V. fib arrest and cardioversion, acute kidney injury that is resolving, likely bicuspid aortic valve with severe regurgitation and elevated troponin likely secondary to above.   New events last 24 hours / Subjective: Continues to improve.  Heart cath planned for tomorrow.  Denies any chest pain or shortness of breath today.  Assessment & Plan:   Principal Problem:   Cardiac arrest [facility]) Active Problems:   LBBB (left bundle branch block)   Elevated troponin   Hypokalemia   Endotracheally intubated   Abnormal CXR   Aspiration pneumonia (HCC)   Severe aortic insufficiency   Acute respiratory failure with hypoxia (HCC)   Aspiration into airway   V. fib arrest Bicuspid aortic valve with severe regurgitation Newly diagnosed acute combined systolic and diastolic congestive heart failure Chronic LBBB Status post intubation and cooling protocol Cardiology planning for heart cath 3/26 to rule out underlying CAD   Anoxic encephalopathy Improved  Coronavirus NL63 URI - NOT SARS-CoV2 Improved  Possible aspiration pneumonia Completed unasyn 7 days   AKI Due to cardiogenic shock in setting of V fib Cr stable at 1.4    DVT prophylaxis: Subq hep Code  Status: Full Family Communication: No family at bedside  Disposition Plan: CIR eval pending. Cath 3/26   Consultants:   PCCM admit  Cardiology    Antimicrobials:  Anti-infectives (From admission, onward)   Start     Dose/Rate Route Frequency Ordered Stop   03/31/18 1145  Ampicillin-Sulbactam (UNASYN) 3 g in sodium chloride 0.9 % 100 mL IVPB  Status:  Discontinued     3 g 200 mL/hr over 30 Minutes Intravenous Every 6 hours 03/31/18 1143 03/31/18 1143   03/29/18 1600  Ampicillin-Sulbactam (UNASYN) 3 g in sodium chloride 0.9 % 100 mL IVPB     3 g 200 mL/hr over 30 Minutes Intravenous Every 6 hours 03/29/18 1508 03/30/18 2339   03/25/18 0800  Ampicillin-Sulbactam (UNASYN) 3 g in sodium chloride 0.9 % 100 mL IVPB     3 g 200 mL/hr over 30 Minutes Intravenous Every 6 hours 03/25/18 0721 03/28/18 2107   03/24/18 2300  Ampicillin-Sulbactam (UNASYN) 3 g in sodium chloride 0.9 % 100 mL IVPB  Status:  Discontinued     3 g 200 mL/hr over 30 Minutes Intravenous Every 6 hours 03/24/18 2211 03/25/18 0721   03/24/18 2030  cefTRIAXone (ROCEPHIN) 1 g in sodium chloride 0.9 % 100 mL IVPB  Status:  Discontinued     1 g 200 mL/hr over 30 Minutes Intravenous Every 24 hours 03/24/18 2025 03/24/18 2208   03/24/18 2030  azithromycin (ZITHROMAX) 500 mg in sodium chloride 0.9 % 250 mL IVPB  Status:  Discontinued     500 mg 250 mL/hr over 60 Minutes Intravenous Every 24 hours 03/24/18 2025 03/25/18 1003  Objective: Vitals:   03/31/18 2041 04/01/18 0518 04/01/18 0522 04/01/18 0740  BP: (!) 142/88 130/76  117/67  Pulse: 85 81  73  Resp: (!) 27 20  (!) 23  Temp:  99.3 F (37.4 C)  99.4 F (37.4 C)  TempSrc:  Oral  Oral  SpO2: 94% 95%  95%  Weight:   64.5 kg   Height:        Intake/Output Summary (Last 24 hours) at 04/01/2018 1019 Last data filed at 04/01/2018 0923 Gross per 24 hour  Intake 1929.1 ml  Output 300 ml  Net 1629.1 ml   Filed Weights   03/30/18 0345 03/31/18 0625 04/01/18  0522  Weight: 66.9 kg 65.2 kg 64.5 kg    Examination:  General exam: Appears calm and comfortable  Respiratory system: Clear to auscultation. Respiratory effort normal. Cardiovascular system: S1 & S2 heard, RRR. No JVD, murmurs, rubs, gallops or clicks. No pedal edema. Gastrointestinal system: Abdomen is nondistended, soft and nontender. No organomegaly or masses felt. Normal bowel sounds heard. Central nervous system: Alert and oriented. No focal neurological deficits. Extremities: Symmetric 5 x 5 power. Skin: No rashes, lesions or ulcers Psychiatry: Judgement and insight appear normal. Mood & affect appropriate.   Data Reviewed: I have personally reviewed following labs and imaging studies  CBC: Recent Labs  Lab 03/28/18 0443 03/29/18 0523 03/30/18 0344 03/31/18 0302 04/01/18 0305  WBC 12.5* 17.2* 13.3* 11.9* 11.5*  NEUTROABS  --   --  10.4*  --   --   HGB 12.6* 12.8* 13.6 13.2 13.9  HCT 40.3 38.8* 40.9 41.3 42.6  MCV 85.2 84.2 83.6 84.3 84.7  PLT 188 215 219 242 240   Basic Metabolic Panel: Recent Labs  Lab 03/26/18 0250  03/26/18 1616  03/27/18 0426  03/28/18 0443 03/29/18 0523 03/30/18 0344 03/31/18 0302 04/01/18 0305  NA 140   < > 145   < > 141   < > 137 138 139 141 140  K 3.5   < > 4.3   < > 4.1   < > 4.9 4.2 3.8 3.8 3.8  CL 119*   < > 122*   < > 119*  --  110 104 105 108 106  CO2 12*   < > 17*   < > 16*  --  18* 24 23 23 24   GLUCOSE 153*   < > 108*   < > 115*  --  103* 106* 105* 104* 85  BUN 13   < > 10   < > 10  --  15 24* 24* 22 20  CREATININE 1.33*   < > 1.51*   < > 1.35*  --  1.23 1.46* 1.35* 1.41* 1.40*  CALCIUM 8.0*   < > 8.0*   < > 8.0*  --  8.9 8.9 9.1 8.9 8.8*  MG 1.9  --  2.1  --  2.0  --  2.0  --  2.3 2.3 2.2  PHOS 1.8*  --  2.3*  --  2.8  --  5.4*  --  3.0  --   --    < > = values in this interval not displayed.   GFR: Estimated Creatinine Clearance: 49.9 mL/min (A) (by C-G formula based on SCr of 1.4 mg/dL (H)). Liver Function Tests:  Recent Labs  Lab 03/30/18 0344 03/31/18 0302  AST  --  23  ALT  --  40  ALKPHOS  --  70  BILITOT  --  1.4*  PROT  --  7.2  ALBUMIN 3.0* 2.8*   No results for input(s): LIPASE, AMYLASE in the last 168 hours. No results for input(s): AMMONIA in the last 168 hours. Coagulation Profile: Recent Labs  Lab 03/25/18 1100  INR 1.3*   Cardiac Enzymes: Recent Labs  Lab 03/25/18 1334  TROPONINI 2.35*   BNP (last 3 results) No results for input(s): PROBNP in the last 8760 hours. HbA1C: No results for input(s): HGBA1C in the last 72 hours. CBG: Recent Labs  Lab 03/31/18 0739 03/31/18 1122 03/31/18 1648 03/31/18 2044 04/01/18 0738  GLUCAP 107* 93 112* 84 98   Lipid Profile: No results for input(s): CHOL, HDL, LDLCALC, TRIG, CHOLHDL, LDLDIRECT in the last 72 hours. Thyroid Function Tests: No results for input(s): TSH, T4TOTAL, FREET4, T3FREE, THYROIDAB in the last 72 hours. Anemia Panel: No results for input(s): VITAMINB12, FOLATE, FERRITIN, TIBC, IRON, RETICCTPCT in the last 72 hours. Sepsis Labs: No results for input(s): PROCALCITON, LATICACIDVEN in the last 168 hours.  Recent Results (from the past 240 hour(s))  Respiratory Panel by PCR     Status: Abnormal   Collection Time: 03/24/18 10:07 PM  Result Value Ref Range Status   Adenovirus NOT DETECTED NOT DETECTED Final   Coronavirus 229E NOT DETECTED NOT DETECTED Final    Comment: (NOTE) The Coronavirus on the Respiratory Panel, DOES NOT test for the novel  Coronavirus (2019 nCoV)    Coronavirus HKU1 NOT DETECTED NOT DETECTED Final   Coronavirus NL63 DETECTED (A) NOT DETECTED Final   Coronavirus OC43 NOT DETECTED NOT DETECTED Final   Metapneumovirus NOT DETECTED NOT DETECTED Final   Rhinovirus / Enterovirus NOT DETECTED NOT DETECTED Final   Influenza A NOT DETECTED NOT DETECTED Final   Influenza B NOT DETECTED NOT DETECTED Final   Parainfluenza Virus 1 NOT DETECTED NOT DETECTED Final   Parainfluenza Virus 2 NOT  DETECTED NOT DETECTED Final   Parainfluenza Virus 3 NOT DETECTED NOT DETECTED Final   Parainfluenza Virus 4 NOT DETECTED NOT DETECTED Final   Respiratory Syncytial Virus NOT DETECTED NOT DETECTED Final   Bordetella pertussis NOT DETECTED NOT DETECTED Final   Chlamydophila pneumoniae NOT DETECTED NOT DETECTED Final   Mycoplasma pneumoniae NOT DETECTED NOT DETECTED Final    Comment: Performed at [facility], [address], [place], [place] [address]  Culture, blood (routine x 2)     Status: None   Collection Time: 03/25/18 12:42 AM  Result Value Ref Range Status   Specimen Description BLOOD LEFT A-LINE  Final   Special Requests   Final    BOTTLES DRAWN AEROBIC AND ANAEROBIC Blood Culture results may not be optimal due to an excessive volume of blood received in culture bottles   Culture   Final    NO GROWTH 5 DAYS Performed at [facility], [address] [place], [place] [address]    Report Status 03/30/2018 FINAL  Final  Culture, blood (routine x 2)     Status: None   Collection Time: 03/25/18 12:44 AM  Result Value Ref Range Status   Specimen Description BLOOD LEFT HAND  Final   Special Requests   Final    BOTTLES DRAWN AEROBIC ONLY Blood Culture adequate volume   Culture   Final    NO GROWTH 5 DAYS Performed at [facility], [address], [place], [place] [address]    Report Status 03/30/2018 FINAL  Final  MRSA PCR Screening     Status: None  Collection Time: 03/25/18  1:14 AM  Result Value Ref Range Status   MRSA by PCR NEGATIVE NEGATIVE Final    Comment:        The GeneXpert MRSA Assay (FDA approved for NASAL specimens only), is one component of a comprehensive MRSA colonization surveillance program. It is not intended to diagnose MRSA infection nor to guide or monitor treatment for MRSA infections. Performed at [facility], [address] [place], [place] [address]   Culture, respiratory (non-expectorated)     Status: None    Collection Time: 03/25/18  2:51 AM  Result Value Ref Range Status   Specimen Description TRACHEAL ASPIRATE  Final   Special Requests NONE  Final   Gram Stain   Final    RARE WBC PRESENT, PREDOMINANTLY PMN FEW GRAM POSITIVE COCCI IN PAIRS IN CLUSTERS RARE YEAST    Culture   Final    FEW Consistent with normal respiratory flora. Performed at [facility], [address] [place], [place] [address]    Report Status 03/27/2018 FINAL  Final       Radiology Studies: No results found.    Scheduled Meds: . chlorhexidine  15 mL Mouth Rinse BID  . feeding supplement (ENSURE ENLIVE)  237 mL Oral BID BM  . heparin injection (subcutaneous)  5,000 Units Subcutaneous Q8H  . mouth rinse  15 mL Mouth Rinse q12n4p  . pantoprazole  40 mg Oral Q1200   Continuous Infusions:   LOS: 8 days    Time spent: 35 minutes   Noralee Stain, DO [facility] [email or web address] 04/01/2018, 10:19 AM

## 2018-04-01 NOTE — Progress Notes (Signed)
Progress Note  Patient Name: Kevin Rojas Date of Encounter: 04/01/2018  Primary Cardiologist: Tonny Bollman, MD   Subjective   Still in bed this morning.  No cardiac complaints.  Denies dyspnea and orthopnea.  We spoke about coronary angiography yesterday.  I have contacted his wife, Kevin Rojas and his son Kevin Rojas.  The procedure and risks have been discussed in detail.  We will plan to proceed with the procedure tomorrow.   Inpatient Medications    Scheduled Meds: . chlorhexidine  15 mL Mouth Rinse BID  . feeding supplement (ENSURE ENLIVE)  237 mL Oral BID BM  . heparin injection (subcutaneous)  5,000 Units Subcutaneous Q8H  . mouth rinse  15 mL Mouth Rinse q12n4p  . pantoprazole  40 mg Oral Q1200  . potassium chloride  20 mEq Oral Daily   Continuous Infusions:  PRN Meds: acetaminophen, albuterol, hydrALAZINE, ondansetron (ZOFRAN) IV   Vital Signs    Vitals:   03/31/18 2041 04/01/18 0518 04/01/18 0522 04/01/18 0740  BP: (!) 142/88 130/76  117/67  Pulse: 85 81  73  Resp: (!) 27 20  (!) 23  Temp:  99.3 F (37.4 C)  99.4 F (37.4 C)  TempSrc:  Oral  Oral  SpO2: 94% 95%  95%  Weight:   64.5 kg   Height:        Intake/Output Summary (Last 24 hours) at 04/01/2018 0851 Last data filed at 04/01/2018 0600 Gross per 24 hour  Intake 1809.1 ml  Output 300 ml  Net 1509.1 ml   Last 3 Weights 04/01/2018 03/31/2018 03/30/2018  Weight (lbs) 142 lb 1.6 oz 143 lb 11.2 oz 147 lb 6.4 oz  Weight (kg) 64.456 kg 65.182 kg 66.86 kg      Telemetry    Normal sinus rhythm- Personally Reviewed  ECG    No recent EKG- Personally Reviewed  Physical Exam  Lying in bed on left side in no distress. GEN:  Appears healthy Neck: No JVD Cardiac: RRR, 2/6 to 3/6 decrescendo aortic regurgitation murmur, with S4 gallop.  No rubs, or S3 gallops.  Respiratory: Clear to auscultation bilaterally. GI: Soft, nontender, non-distended  MS: No edema; No deformity. Neuro:  Nonfocal  Psych: Normal  affect   Labs    Chemistry Recent Labs  Lab 03/30/18 0344 03/31/18 0302 04/01/18 0305  NA 139 141 140  K 3.8 3.8 3.8  CL 105 108 106  CO2 23 23 24   GLUCOSE 105* 104* 85  BUN 24* 22 20  CREATININE 1.35* 1.41* 1.40*  CALCIUM 9.1 8.9 8.8*  PROT  --  7.2  --   ALBUMIN 3.0* 2.8*  --   AST  --  23  --   ALT  --  40  --   ALKPHOS  --  70  --   BILITOT  --  1.4*  --   GFRNONAA 56* 53* 53*  GFRAA >60 >60 >60  ANIONGAP 11 10 10      Hematology Recent Labs  Lab 03/30/18 0344 03/31/18 0302 04/01/18 0305  WBC 13.3* 11.9* 11.5*  RBC 4.89 4.90 5.03  HGB 13.6 13.2 13.9  HCT 40.9 41.3 42.6  MCV 83.6 84.3 84.7  MCH 27.8 26.9 27.6  MCHC 33.3 32.0 32.6  RDW 14.5 14.3 14.1  PLT 219 242 240    Cardiac Enzymes Recent Labs  Lab 03/25/18 1334  TROPONINI 2.35*   No results for input(s): TROPIPOC in the last 168 hours.   BNPNo results for input(s): BNP, PROBNP in  the last 168 hours.   DDimer No results for input(s): DDIMER in the last 168 hours.   Radiology    No results found.  Cardiac Studies   2D Doppler echocardiogram 03/25/2018: IMPRESSIONS    1. The left ventricle has mildly reduced systolic function, with an ejection fraction of 45-50%. The cavity size was mildly dilated. Left ventricular diastolic Doppler parameters are consistent with pseudonormalization.  2. The right ventricle has normal systolic function. The cavity was normal. There is no increase in right ventricular wall thickness.  3. Right atrial size was mildly dilated.  4. Trivial pericardial effusion is present.  5. Mild thickening of the mitral valve leaflet.  6. The aortic valveis bicuspid Aortic valve regurgitation is severe by color flow Doppler.  7. The aortic valve appears to be a bicuspid or functionally bicuspid AV. There is severe AI.     The aortic root is mildly dilated.  8. There is mild dilatation of the ascending aorta.  9.     The aortic valve appears to be a bicuspid or  functionally bicuspid AV. There is severe AI.     The aortic root is mildly dilated .     There is concern for a Type A aortic dissection.     Suggest STAT CT angiogram of the chest for further evaluation.           10. The inferior vena cava was normal in size with <50% respiratory variability. 11. The interatrial septum was not assessed.  Patient Profile     63 y.o. male with ventricular fibrillation cardiac arrest, successfully resuscitated.  Work-up has included identification of severe aortic regurgitation with LV enlargement.  Postarrest the patient has slowly progressed and cognitive impairment and functional ability is significantly improved.  Amiodarone was discontinued on 318/2020 due to severe bradycardia while on hypothermia protocol.  Assessment & Plan    1. Ventricular fibrillation cardiac arrest.  Unknown etiology.  Underlying coronary disease needs to be excluded.  V. fib could have been related to hypokalemia in the setting of structural heart disease including bicuspid aortic valve with severe regurgitation and mildly depressed LV function. 2. Bicuspid aortic valve with severe aortic regurgitation, not previously recognized 3. LVEF 45 to 50%, chronic combined systolic and diastolic heart failure, stable.  No evidence of volume overload. 4. Stage III chronic kidney disease 5. Anoxic brain injury, dramatically improving.  Patient is now conversant, with accurate responses to questions, and ambulatory.  After discussion with family and treating team, we will pursue right and left heart catheterization with coronary angiography to fully assess the patient's underlying cardiovascular substrate.  We need to exclude ischemic heart disease as a potential factor in his ventricular fibrillation.  He will need aortic valve therapy.  The patient was counseled to undergo left heart catheterization, coronary angiography, and possible percutaneous coronary intervention with stent  implantation. The procedural risks and benefits were discussed in detail. The risks discussed included death, stroke, myocardial infarction, life-threatening bleeding, limb ischemia, kidney injury, allergy, and possible emergency cardiac surgery. The risk of these significant complications were estimated to occur less than 1% of the time. After discussion, the patient has agreed to proceed.     For questions or updates, please contact [facility] Please consult [email or web address] for contact info under        Signed, Lesleigh Noe, MD  04/01/2018, 8:52 AM

## 2018-04-01 NOTE — Progress Notes (Signed)
  Speech Language Pathology Treatment: Dysphagia  Patient Details Name: Kevin Rojas MRN: [id number] DOB: April 14, 1955 Today's Date: 04/01/2018 Time: 6144-3154 SLP Time Calculation (min) (ACUTE ONLY): 16 min  Assessment / Plan / Recommendation Clinical Impression  Pt denies any nausea or stomach pain but says that he still has a mild sore throat. He consumed advanced solids with mildly prolonged mastication as he did not place his dentures for intake. Pt was offered his dentures and adhesive, but he could not decide if he should use the adhesive or not, and ended up not placing them. Recommend advancing to Dys 3 solids and thin liquids. Will f/u for tolerance and further progression. He would benefit from cognitive evaluation acutely or at next level of care.   HPI HPI: 63 year old man admitted 3/17 with V. fib arrest from home.  He has a history of chronic left bundle branch block. Underwent hypothermia protocol; vomited around [facility] airway. Intubated in ER 3/17-3/21.  NG placed 3/21 for stomach decompression due to ongoing vomiting.        SLP Plan  Continue with current plan of care       Recommendations  Diet recommendations: Dysphagia 3 (mechanical soft);Thin liquid Liquids provided via: Cup;Straw Medication Administration: Whole meds with liquid Supervision: Patient able to self feed;Intermittent supervision to cue for compensatory strategies Compensations: Slow rate;Small sips/bites;Minimize environmental distractions Postural Changes and/or Swallow Maneuvers: Seated upright 90 degrees;Upright 30-60 min after meal                Oral Care Recommendations: Oral care BID Follow up Recommendations: Inpatient Rehab SLP Visit Diagnosis: Dysphagia, unspecified (R13.10) Plan: Continue with current plan of care       GO                Kevin Rojas Krista Som 04/01/2018, 12:35 PM  Ivar Drape, M.A. CCC-SLP Acute Herbalist [phone] Office [phone]

## 2018-04-01 NOTE — Progress Notes (Signed)
Occupational Therapy Treatment Patient Details Name: Kevin Rojas MRN: [id number] DOB: 10/17/1955 Today's Date: 04/01/2018    History of present illness Pt is a 63 year old man admitted 3/17 with V fib arrest, + CPR. ETT 3/17-3/21. Hospital course complicated by ongoing vomiting, NGT placed 3/21. PMH: chronic L bundle branch block.    OT comments  Pt progressing well. Able to perform LB dressing and standing grooming with supervision. Still unsteady with dynamic balance and demonstrating memory deficits. Will formally assess cognition next visit.  Will continue to follow.  Follow Up Recommendations  CIR    Equipment Recommendations  3 in 1 bedside commode    Recommendations for Other Services      Precautions / Restrictions Precautions Precautions: Fall       Mobility Bed Mobility Overal bed mobility: Modified Independent             General bed mobility comments: HOB up  Transfers Overall transfer level: Needs assistance Equipment used: Rolling walker (2 wheeled) Transfers: Sit to/from Stand Sit to Stand: Supervision         General transfer comment: for safety, reaching for furniture    Balance     Sitting balance-Leahy Scale: Good Sitting balance - Comments: no LOB with donning pants and socks     Standing balance-Leahy Scale: Fair Standing balance comment: can release walker in static standing at sink                           ADL either performed or assessed with clinical judgement   ADL Overall ADL's : Needs assistance/impaired Eating/Feeding: Independent   Grooming: Wash/dry hands;Wash/dry face;Standing;Supervision/safety               Lower Body Dressing: Supervision/safety;Sit to/from stand Lower Body Dressing Details (indicate cue type and reason): pajama pants and socks Toilet Transfer: Ambulation;RW;Min guard   Toileting- Clothing Manipulation and Hygiene: Minimal assistance;Sit to/from stand       Functional  mobility during ADLs: Rolling walker;Min guard       Vision       Perception     Praxis      Cognition Arousal/Alertness: Awake/alert Behavior During Therapy: WFL for tasks assessed/performed Overall Cognitive Status: Impaired/Different from baseline Area of Impairment: Memory                     Memory: Decreased short-term memory         General Comments: will assess cognition formally next visit        Exercises     Shoulder Instructions       General Comments      Pertinent Vitals/ Pain       Pain Assessment: Faces Faces Pain Scale: Hurts a little bit Pain Location: chest from compressions Pain Descriptors / Indicators: Sore Pain Intervention(s): Monitored during session  Home Living                                          Prior Functioning/Environment              Frequency  Min 2X/week        Progress Toward Goals  OT Goals(current goals can now be found in the care plan section)  Progress towards OT goals: Progressing toward goals  Acute Rehab OT Goals Patient Stated Goal: to return  to PLOF OT Goal Formulation: With patient/family Time For Goal Achievement: 04/12/18 Potential to Achieve Goals: Good  Plan Discharge plan remains appropriate    Co-evaluation                 AM-PAC OT "6 Clicks" Daily Activity     Outcome Measure   Help from another person eating meals?: None Help from another person taking care of personal grooming?: A Little Help from another person toileting, which includes using toliet, bedpan, or urinal?: A Little Help from another person bathing (including washing, rinsing, drying)?: A Little Help from another person to put on and taking off regular upper body clothing?: None Help from another person to put on and taking off regular lower body clothing?: A Little 6 Click Score: 20    End of Session Equipment Utilized During Treatment: Gait belt;Rolling walker  OT Visit  Diagnosis: Unsteadiness on feet (R26.81);Other abnormalities of gait and mobility (R26.89);Muscle weakness (generalized) (M62.81)   Activity Tolerance Patient tolerated treatment well   Patient Left in chair;with call bell/phone within reach   Nurse Communication          Time: 0927-1001 OT Time Calculation (min): 34 min  Charges: OT General Charges $OT Visit: 1 Visit OT Treatments $Self Care/Home Management : 23-37 mins  Martie Round, OTR/L Acute Rehabilitation Services Pager: [phone] Office: [phone]  Evern Bio 04/01/2018, 11:16 AM

## 2018-04-01 NOTE — Progress Notes (Addendum)
Physical Therapy Treatment Patient Details Name: Kevin Rojas MRN: [id number] DOB: 1956/01/02 Today's Date: 04/01/2018    History of Present Illness Pt is a 63 year old man admitted 03/24/18 with V fib arrest, requiring CPR. ETT 3/17-3/21. Hospital course complicated by ongoing vomiting, NGT placed 3/21. Plan for LHC, coronary angiography, and possible stent placement 3/26. PMH includes chronic L bundle branch block.    PT Comments    Pt progressing well with mobility. Able to increase gait distance without DME, relying on frequent min guard to minA to prevent LOB. Dynamic Gait Index score of 13/24 indicates increased risk for falls with higher level balance activities. Pt demonstrates poor awareness, decreased attention and poor insight into deficits. These cognitive impairments seem to significantly affect pt's balance deficits with mobility. Pt would benefit from more detailed cognitive evaluation.    Follow Up Recommendations  CIR;Supervision/Assistance - 24 hour     Equipment Recommendations  (TBD)    Recommendations for Other Services       Precautions / Restrictions Precautions Precautions: Fall Restrictions Weight Bearing Restrictions: No    Mobility  Bed Mobility               General bed mobility comments: Received sitting in recliner  Transfers Overall transfer level: Needs assistance Equipment used: None Transfers: Sit to/from Stand Sit to Stand: Min guard         General transfer comment: Posterior LOB upon standing requiring UE support on sink to maintain balance; min guard for safety  Ambulation/Gait Ambulation/Gait assistance: Min guard;Min assist Gait Distance (Feet): 250 Feet(+250) Assistive device: None;1 person hand held assist Gait Pattern/deviations: Step-through pattern;Decreased stride length;Decreased dorsiflexion - right;Drifts right/left;Staggering right;Staggering left Gait velocity: Decreased Gait velocity interpretation: <1.8  ft/sec, indicate of risk for recurrent falls General Gait Details: Slow, intermittently unsteady gait; intermittent minA to prevent LOB. Pt dragging feet, able to correct with cues, but easily distracted with clear worsening balance and gait mechanics with conversation/other distractions.   Stairs             Wheelchair Mobility    Modified Rankin (Stroke Patients Only)       Balance Overall balance assessment: Needs assistance Sitting-balance support: Single extremity supported;Feet supported Sitting balance-Leahy Scale: Good       Standing balance-Leahy Scale: Fair   Single Leg Stance - Right Leg: 6 Single Leg Stance - Left Leg: 0     Rhomberg - Eyes Opened: 10       Standardized Balance Assessment Standardized Balance Assessment : TUG: Timed Up and Go Test;Dynamic Gait Index   Dynamic Gait Index Level Surface: Mild Impairment Change in Gait Speed: Moderate Impairment Gait with Horizontal Head Turns: Mild Impairment Gait with Vertical Head Turns: Moderate Impairment Gait and Pivot Turn: Mild Impairment Step Over Obstacle: Mild Impairment Step Around Obstacles: Mild Impairment Steps: Moderate Impairment Total Score: 13 Timed Up and Go Test TUG: Manual TUG Manual TUG (seconds): 11.9    Cognition Arousal/Alertness: Awake/alert Behavior During Therapy: WFL for tasks assessed/performed Overall Cognitive Status: Impaired/Different from baseline Area of Impairment: Attention;Memory;Following commands;Safety/judgement;Awareness;Problem solving                   Current Attention Level: Selective Memory: Decreased short-term memory Following Commands: Follows one step commands consistently Safety/Judgement: Decreased awareness of deficits;Decreased awareness of safety Awareness: Emergent Problem Solving: Requires verbal cues General Comments: Pt easily distracted; perseverating on statement "I've just been sitting too long" (referring to poor  balance), repeating this statement  often despite attempts to redirect. Able to follow multi-step commands with intermittent cues. Did not recall working with SLP today. Did not ask orientation questions specifically, but likely some orientation issues      Exercises      General Comments        Pertinent Vitals/Pain Pain Assessment: No/denies pain    Home Living                      Prior Function            PT Goals (current goals can now be found in the care plan section) Acute Rehab PT Goals Patient Stated Goal: to return to PLOF PT Goal Formulation: With patient/family Time For Goal Achievement: 04/12/18 Potential to Achieve Goals: Good Progress towards PT goals: Progressing toward goals    Frequency    Min 3X/week      PT Plan Current plan remains appropriate    Co-evaluation              AM-PAC PT "6 Clicks" Mobility   Outcome Measure  Help needed turning from your back to your side while in a flat bed without using bedrails?: A Little Help needed moving from lying on your back to sitting on the side of a flat bed without using bedrails?: A Little Help needed moving to and from a bed to a chair (including a wheelchair)?: A Little Help needed standing up from a chair using your arms (e.g., wheelchair or bedside chair)?: A Little Help needed to walk in hospital room?: A Little Help needed climbing 3-5 steps with a railing? : A Lot 6 Click Score: 17    End of Session Equipment Utilized During Treatment: Gait belt Activity Tolerance: Patient tolerated treatment well Patient left: in chair;with call bell/phone within reach Nurse Communication: Mobility status PT Visit Diagnosis: Unsteadiness on feet (R26.81);Difficulty in walking, not elsewhere classified (R26.2);Muscle weakness (generalized) (M62.81)     Time: 8182-9937 PT Time Calculation (min) (ACUTE ONLY): 26 min  Charges:  $Gait Training: 8-22 mins $Neuromuscular Re-education: 8-22  mins                    Ina Homes, PT, DPT Acute Rehabilitation Services  Pager [phone] Office [phone]  Malachy Chamber 04/01/2018, 4:58 PM

## 2018-04-01 NOTE — Progress Notes (Signed)
Inpatient Rehabilitation Admissions Coordinator  Noted plans for cardiac cath tomorrow. I will hold on proceeding with insurance authorization for a possible inpt rehab admit. Rehab venue pending his medical plan and functional progress with therapy.  Ottie Glazier, RN, MSN Rehab Admissions Coordinator [phone] 04/01/2018 10:44 AM

## 2018-04-02 ENCOUNTER — Inpatient Hospital Stay (HOSPITAL_COMMUNITY): Payer: BLUE CROSS/BLUE SHIELD

## 2018-04-02 ENCOUNTER — Encounter (HOSPITAL_COMMUNITY): Payer: Self-pay | Admitting: Cardiovascular Disease

## 2018-04-02 ENCOUNTER — Encounter (HOSPITAL_COMMUNITY)
Admission: EM | Disposition: A | Discharge status: Rehabilitation Facility | Payer: Self-pay | Source: Home / Self Care | Attending: Internal Medicine

## 2018-04-02 DIAGNOSIS — Z01810 Encounter for preprocedural cardiovascular examination: Secondary | ICD-10-CM

## 2018-04-02 DIAGNOSIS — I351 Nonrheumatic aortic (valve) insufficiency: Secondary | ICD-10-CM

## 2018-04-02 HISTORY — PX: AORTIC ARCH ANGIOGRAPHY: CATH118224

## 2018-04-02 HISTORY — PX: RIGHT/LEFT HEART CATH AND CORONARY ANGIOGRAPHY: CATH118266

## 2018-04-02 LAB — CBC
HCT: 40.3 % (ref 39.0–52.0)
HEMATOCRIT: 38.9 % — AB (ref 39.0–52.0)
Hemoglobin: 12.3 g/dL — ABNORMAL LOW (ref 13.0–17.0)
Hemoglobin: 12.8 g/dL — ABNORMAL LOW (ref 13.0–17.0)
MCH: 26.7 pg (ref 26.0–34.0)
MCH: 27 pg (ref 26.0–34.0)
MCHC: 31.6 g/dL (ref 30.0–36.0)
MCHC: 31.8 g/dL (ref 30.0–36.0)
MCV: 84.4 fL (ref 80.0–100.0)
MCV: 85 fL (ref 80.0–100.0)
Platelets: 251 10*3/uL (ref 150–400)
Platelets: 276 10*3/uL (ref 150–400)
RBC: 4.61 MIL/uL (ref 4.22–5.81)
RBC: 4.74 MIL/uL (ref 4.22–5.81)
RDW: 13.8 % (ref 11.5–15.5)
RDW: 13.9 % (ref 11.5–15.5)
WBC: 10.4 10*3/uL (ref 4.0–10.5)
WBC: 9.9 10*3/uL (ref 4.0–10.5)
nRBC: 0 % (ref 0.0–0.2)
nRBC: 0 % (ref 0.0–0.2)

## 2018-04-02 LAB — BASIC METABOLIC PANEL
Anion gap: 10 (ref 5–15)
BUN: 19 mg/dL (ref 8–23)
CHLORIDE: 107 mmol/L (ref 98–111)
CO2: 23 mmol/L (ref 22–32)
Calcium: 8.7 mg/dL — ABNORMAL LOW (ref 8.9–10.3)
Creatinine, Ser: 1.36 mg/dL — ABNORMAL HIGH (ref 0.61–1.24)
GFR calc Af Amer: >60 mL/min (ref >60)
GFR calc non Af Amer: 55 mL/min — ABNORMAL LOW (ref >60)
Glucose, Bld: 91 mg/dL (ref 70–99)
Potassium: 3.8 mmol/L (ref 3.5–5.1)
SODIUM: 140 mmol/L (ref 135–145)

## 2018-04-02 LAB — POCT I-STAT EG7
ACID-BASE DEFICIT: 2 mmol/L (ref 0.0–2.0)
Acid-base deficit: 1 mmol/L (ref 0.0–2.0)
BICARBONATE: 24.3 mmol/L (ref 20.0–28.0)
Bicarbonate: 23.2 mmol/L (ref 20.0–28.0)
CALCIUM ION: 1.21 mmol/L (ref 1.15–1.40)
Calcium, Ion: 1.24 mmol/L (ref 1.15–1.40)
HCT: 35 % — ABNORMAL LOW (ref 39.0–52.0)
HCT: 36 % — ABNORMAL LOW (ref 39.0–52.0)
HEMOGLOBIN: 12.2 g/dL — AB (ref 13.0–17.0)
Hemoglobin: 11.9 g/dL — ABNORMAL LOW (ref 13.0–17.0)
O2 Saturation: 73 %
O2 Saturation: 74 %
Potassium: 3.6 mmol/L (ref 3.5–5.1)
Potassium: 3.7 mmol/L (ref 3.5–5.1)
SODIUM: 142 mmol/L (ref 135–145)
Sodium: 142 mmol/L (ref 135–145)
TCO2: 24 mmol/L (ref 22–32)
TCO2: 25 mmol/L (ref 22–32)
pCO2, Ven: 39.2 mmHg — ABNORMAL LOW (ref 44.0–60.0)
pCO2, Ven: 40 mmHg — ABNORMAL LOW (ref 44.0–60.0)
pH, Ven: 7.381 (ref 7.250–7.430)
pH, Ven: 7.392 (ref 7.250–7.430)
pO2, Ven: 39 mmHg (ref 32.0–45.0)
pO2, Ven: 39 mmHg (ref 32.0–45.0)

## 2018-04-02 LAB — POCT I-STAT 7, (LYTES, BLD GAS, ICA,H+H)
Acid-base deficit: 1 mmol/L (ref 0.0–2.0)
Bicarbonate: 22.7 mmol/L (ref 20.0–28.0)
Calcium, Ion: 1.22 mmol/L (ref 1.15–1.40)
HEMATOCRIT: 33 % — AB (ref 39.0–52.0)
Hemoglobin: 11.2 g/dL — ABNORMAL LOW (ref 13.0–17.0)
O2 Saturation: 96 %
POTASSIUM: 3.6 mmol/L (ref 3.5–5.1)
Sodium: 140 mmol/L (ref 135–145)
TCO2: 24 mmol/L (ref 22–32)
pCO2 arterial: 35.1 mmHg (ref 32.0–48.0)
pH, Arterial: 7.419 (ref 7.350–7.450)
pO2, Arterial: 79 mmHg — ABNORMAL LOW (ref 83.0–108.0)

## 2018-04-02 LAB — MAGNESIUM: Magnesium: 2 mg/dL (ref 1.7–2.4)

## 2018-04-02 LAB — CREATININE, SERUM
Creatinine, Ser: 1.33 mg/dL — ABNORMAL HIGH (ref 0.61–1.24)
GFR calc Af Amer: >60 mL/min (ref >60)
GFR calc non Af Amer: 57 mL/min — ABNORMAL LOW (ref >60)

## 2018-04-02 SURGERY — RIGHT/LEFT HEART CATH AND CORONARY ANGIOGRAPHY
Anesthesia: LOCAL

## 2018-04-02 MED ORDER — SODIUM CHLORIDE 0.9% FLUSH: 3.0000 mL | INTRAVENOUS | Status: DC | PRN

## 2018-04-02 MED ORDER — FENTANYL CITRATE (PF) 100 MCG/2ML IJ SOLN
INTRAMUSCULAR | Status: AC
  Filled 2018-04-02: qty 2

## 2018-04-02 MED ORDER — VERAPAMIL HCL 2.5 MG/ML IV SOLN
INTRAVENOUS | Status: DC | PRN
  Administered 2018-04-02: 09:00:00 via INTRA_ARTERIAL

## 2018-04-02 MED ORDER — DIAZEPAM 5 MG PO TABS
5.0000 mg | ORAL_TABLET | Freq: Four times a day (QID) | ORAL | Status: DC | PRN
  Filled 2018-04-02: qty 1

## 2018-04-02 MED ORDER — SODIUM CHLORIDE 0.9% FLUSH
3.0000 mL | Freq: Two times a day (BID) | INTRAVENOUS | Status: DC
  Administered 2018-04-02 – 2018-04-05 (×6): 3 mL via INTRAVENOUS

## 2018-04-02 MED ORDER — IOHEXOL 350 MG/ML SOLN
INTRAVENOUS | Status: DC | PRN
  Administered 2018-04-02: 95 mL via INTRACARDIAC

## 2018-04-02 MED ORDER — ACETAMINOPHEN 325 MG PO TABS: 650.0000 mg | ORAL_TABLET | ORAL | Status: DC | PRN

## 2018-04-02 MED ORDER — HEPARIN SODIUM (PORCINE) 5000 UNIT/ML IJ SOLN
5000.0000 [IU] | Freq: Three times a day (TID) | INTRAMUSCULAR | Status: DC
  Administered 2018-04-03: 5000 [IU] via SUBCUTANEOUS
  Filled 2018-04-02: qty 1

## 2018-04-02 MED ORDER — MIDAZOLAM HCL 2 MG/2ML IJ SOLN
INTRAMUSCULAR | Status: DC | PRN
  Administered 2018-04-02: 2 mg via INTRAVENOUS

## 2018-04-02 MED ORDER — LABETALOL HCL 5 MG/ML IV SOLN: 10.0000 mg | INTRAVENOUS | Status: AC | PRN

## 2018-04-02 MED ORDER — MIDAZOLAM HCL 2 MG/2ML IJ SOLN
INTRAMUSCULAR | Status: AC
  Filled 2018-04-02: qty 2

## 2018-04-02 MED ORDER — FENTANYL CITRATE (PF) 100 MCG/2ML IJ SOLN
INTRAMUSCULAR | Status: DC | PRN
  Administered 2018-04-02: 25 ug via INTRAVENOUS

## 2018-04-02 MED ORDER — HYDRALAZINE HCL 20 MG/ML IJ SOLN: 10.0000 mg | INTRAMUSCULAR | Status: AC | PRN

## 2018-04-02 MED ORDER — HEPARIN SODIUM (PORCINE) 1000 UNIT/ML IJ SOLN
INTRAMUSCULAR | Status: AC
  Filled 2018-04-02: qty 1

## 2018-04-02 MED ORDER — SODIUM CHLORIDE 0.9 % IV SOLN
INTRAVENOUS | Status: AC
  Administered 2018-04-02: 10:00:00 via INTRAVENOUS

## 2018-04-02 MED ORDER — SODIUM CHLORIDE 0.9 % IV SOLN: 250.0000 mL | INTRAVENOUS | Status: DC | PRN

## 2018-04-02 MED ORDER — LIDOCAINE HCL (PF) 1 % IJ SOLN
INTRAMUSCULAR | Status: AC
  Filled 2018-04-02: qty 30

## 2018-04-02 MED ORDER — VERAPAMIL HCL 2.5 MG/ML IV SOLN
INTRAVENOUS | Status: AC
  Filled 2018-04-02: qty 2

## 2018-04-02 MED ORDER — HEPARIN (PORCINE) IN NACL 1000-0.9 UT/500ML-% IV SOLN
INTRAVENOUS | Status: DC | PRN
  Administered 2018-04-02 (×2): 500 mL

## 2018-04-02 MED ORDER — ONDANSETRON HCL 4 MG/2ML IJ SOLN: 4.0000 mg | Freq: Four times a day (QID) | INTRAMUSCULAR | Status: DC | PRN

## 2018-04-02 MED ORDER — HEPARIN (PORCINE) IN NACL 1000-0.9 UT/500ML-% IV SOLN
INTRAVENOUS | Status: AC
  Filled 2018-04-02: qty 1000

## 2018-04-02 MED ORDER — HEPARIN SODIUM (PORCINE) 1000 UNIT/ML IJ SOLN
INTRAMUSCULAR | Status: DC | PRN
  Administered 2018-04-02: 3500 [IU] via INTRAVENOUS

## 2018-04-02 MED ORDER — LIDOCAINE HCL (PF) 1 % IJ SOLN
INTRAMUSCULAR | Status: DC | PRN
  Administered 2018-04-02 (×3): 2 mL

## 2018-04-02 SURGICAL SUPPLY — 17 items
CATH 5FR JL3.5 JR4 ANG PIG MP (CATHETERS) ×2 IMPLANT
CATH BALLN WEDGE 5F 110CM (CATHETERS) ×2 IMPLANT
CATH INFINITI 5FR AL1 (CATHETERS) ×2 IMPLANT
CATH INFINITI 5FR JL4 (CATHETERS) ×2 IMPLANT
CATH INFINITI 5FR JL5 (CATHETERS) ×2 IMPLANT
DEVICE RAD COMP TR BAND LRG (VASCULAR PRODUCTS) ×2 IMPLANT
GLIDESHEATH SLEND SS 6F .021 (SHEATH) ×2 IMPLANT
GUIDEWIRE .025 260CM (WIRE) ×2 IMPLANT
GUIDEWIRE INQWIRE 1.5J.035X260 (WIRE) ×1 IMPLANT
INQWIRE 1.5J .035X260CM (WIRE) ×2
KIT HEART LEFT (KITS) ×2 IMPLANT
PACK CARDIAC CATHETERIZATION (CUSTOM PROCEDURE TRAY) ×2 IMPLANT
SHEATH GLIDE SLENDER 4/5FR (SHEATH) ×2 IMPLANT
SYR MEDRAD MARK 7 150ML (SYRINGE) ×2 IMPLANT
TRANSDUCER W/STOPCOCK (MISCELLANEOUS) ×2 IMPLANT
TUBING CIL FLEX 10 FLL-RA (TUBING) ×2 IMPLANT
WIRE EMERALD ST .035X150CM (WIRE) ×2 IMPLANT

## 2018-04-02 NOTE — Consult Note (Addendum)
[address]       [place] [address]             [phone]        [place] [facility] Medical Record #[id number] Date of Birth: 02/28/1955  Referring: Dr. Katrinka Blazing, MD Primary Care: [name], MD Primary Cardiologist:Delvonte Excell Seltzer, MD  Chief Complaint:    Chief Complaint  Patient presents with   Cardiac Arrest  Reason for consultation: Severe aortic insufficiency  History of Present Illness:     This is a 63 year old male with a past medical history of LBBB, heart murmur, and kidney stone who presented to [facility] ED in cardiac arrest. According to medical records, the patient's wife left the room and when she returned, the patient was found unresponsive in the chair. She lowered the patient to the floor and performed CPR until EMS arrived. The patient was found to be in ventricular fibrillation and he was shocked two times with ROSC (they shocked at 200 J and had ROSC, which then deteriorated to V. tach without pulses).  He received a second 300 J.  King airway was placed. In the ED, patient vomited around ETT. Arctic sun protocol was started. EKG showed sinus rhythm and LBBB (has a history). Initial Troponin I was 0.04 and went as high as 4.8. CXR showed increased vascular congestion, mild pulmonary edema, and no pneumothorax. He was put on Amiodarone, Levophed, and Heparin drips. He had acute encephalopathy, which gradually improved over time. He was put on Unasyn for possible aspiration pneumonia. Of note, Coronavirus NL63 ("common cold" NOT SARS-CoV2) was positive. Also, his creatinine upon admission was elevated at 1.5 (likely related to shock, v fib arrest). His creatinine has improved and is down to 1.36 today.  Echo showed LVEF 45-50%, bicuspid aortic valve, severe AI, and the aortic root was mildly dilated. There was concern for Type A aortic dissection. CT  To rule out  PE was done,  aorta was poorly opacified , mild cardiomegaly, dilated  ascending root to 4.4 cm, and dependent consolidation in both lungs, right greater than left,  likely due to multifocal pneumonia.  CT of the head showed no hemorrhage or infarction and large amount of secretions in the posterior nasopharynx. His clinical condition continued to improve and he was extubated on 03/21. Speech pathology and PT were consulted. He underwent a cardiac catheterization on 03/26 and was found to NOT have coronary artery disease but LVEF 45-50%, bicuspid aortic valve with severe aortic insufficiency and mild aortic stenosis.  Currently, the patient vital signs are stable and he denies chest pain or shortness of breath. The patient's mental status has progressively improved.  He is now awake alert and able to give his history in good detail.  Other than being tired on the day that he had a cardiac arrest he denies any specific chest pain or other symptoms.  Current Activity/ Functional Status: Patient is independent with mobility/ambulation, transfers, ADL's, IADL's.   Zubrod Score: At the time of surgery this patients most appropriate activity status/level should be described as: []     0    Normal activity, no symptoms [x]     1    Restricted in physical strenuous activity but ambulatory, able to do out light work []     2    Ambulatory and capable of self care, unable to do work activities, up and about more than 50%  Of the time                            []   3    Only limited self care, in bed greater than 50% of waking hours []     4    Completely disabled, no self care, confined to bed or chair []     5    Moribund   Past medical history reviewed. Kidney stone and heart murmur (since age 43-17)  Past surgical history reviewed. 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 He is married and his wife's second heart surgery was done by Dr. Tyrone Sage. He has 4 sons and 6 grandchildren.  Allergies: No Known Allergies  Current Facility-Administered Medications  Medication Dose Route Frequency Provider Last Rate Last Dose   0.9 %  sodium chloride infusion   Intravenous Continuous Lennette Bihari, MD 100 mL/hr at 04/02/18 1017     0.9 %  sodium chloride infusion  250 mL Intravenous PRN Lennette Bihari, MD       acetaminophen (TYLENOL) tablet 650 mg  650 mg Oral Q4H PRN Lennette Bihari, MD       albuterol (PROVENTIL) (2.5 MG/3ML) 0.083% nebulizer solution 2.5 mg  2.5 mg Nebulization Q2H PRN Lonia Blood, MD       chlorhexidine (PERIDEX) 0.12 % solution 15 mL  15 mL Mouth Rinse BID Luciano Cutter, MD   15 mL at 04/02/18 1032   diazepam (VALIUM) tablet 5 mg  5 mg Oral Q6H PRN Lennette Bihari, MD       feeding supplement (ENSURE ENLIVE) (ENSURE ENLIVE) liquid 237 mL  237 mL Oral BID BM Lonia Blood, MD   237 mL at 04/01/18 1349   [START ON 04/03/2018] heparin injection 5,000 Units  5,000 Units Subcutaneous Q8H Lennette Bihari, MD       hydrALAZINE (APRESOLINE) injection 10 mg  10 mg Intravenous Q20 Min PRN Lennette Bihari, MD       hydrALAZINE (APRESOLINE) injection 10-40 mg  10-40 mg Intravenous Q4H PRN Oretha Milch, MD   20 mg at 03/28/18 0838   labetalol (NORMODYNE,TRANDATE) injection 10 mg  10 mg Intravenous Q10 min PRN Lennette Bihari, MD       MEDLINE mouth rinse  15 mL Mouth Rinse q12n4p Luciano Cutter, MD   15 mL at 04/01/18 1353   ondansetron (ZOFRAN) injection 4 mg  4 mg Intravenous Q6H PRN Lennette Bihari, MD       pantoprazole (PROTONIX) EC tablet 40 mg  40 mg Oral Q1200 Lonia Blood, MD   40 mg at 04/01/18 1349   sodium chloride flush (NS) 0.9 % injection 3 mL  3 mL Intravenous Q12H Lennette Bihari, MD       sodium chloride flush (NS) 0.9 % injection 3 mL  3 mL Intravenous  PRN Lennette Bihari, MD        Medications Prior to Admission  Medication Sig Dispense Refill Last Dose   acetaminophen (TYLENOL) 500 MG tablet Take 1,000 mg by mouth every 6 (six) hours as needed for mild pain.   Past Week at Unknown time   HYDROcodone-acetaminophen (NORCO/VICODIN) 5-325 MG tablet Take 1 tablet by mouth every 6 (six) hours as needed for severe pain. (Patient not taking: Reported on 03/25/2018) 10  tablet 0 Not Taking at Unknown time   ondansetron (ZOFRAN ODT) 8 MG disintegrating tablet  ODT q4 hours prn nausea (Patient not taking: Reported on 03/25/2018) 4 tablet 0 Not Taking at Unknown time    Family History  Problem Relation Age of Onset   Pulmonary embolism Mother    Prostate cancer, pacemaker Father        passed awary at 67   Review of Systems:     Cardiac Review of Systems: Y or  [ N   ]= no  Chest Pain [ N   ]  Resting SOB Klaus.Mock   ] Exertional SOB  Klaus.Mock  ]  Orthopnea [ N ]   Pedal Edema [ N  ]     General Review of Systems: [Y] = yes [ N ]=no Constitional: fatigue [Y  ]; nausea [ N ]; night sweats [ N ]; fever [ N]; or chills [  N]                                                               Dental: Last Dentist visit: About 5 years ago. He has both upper and lower dentures  Eye : blurred vision [ N ];   Amaurosis fugax[N  ]; Resp: cough [ N ];  wheezing[N  ];  hemoptysis[ N ];  GI:  dysphagia[N  ]; melena[N  ];  hematochezia [ N ];  GU:  hematuria[N  ];                Skin: rash, swelling[ N ];,   Heme/Lymph:  anemia[ N ];  Neuro: TIA[ N ];  stroke[ N ];    Psych:depression[ N ]; anxiety[ N ];  Endocrine: diabetes[ N ];  thyroid dysfunction[ N ];                 Physical Exam: BP 134/83    Pulse 90    Temp 97.9 F (36.6 C) (Oral)    Resp (!) 21    Ht  (1.778 m)    Wt 64.2 kg    SpO2 98%    BMI 20.30 kg/m    General appearance: alert, cooperative and no distress, full dentures Head: Normocephalic, without obvious abnormality,  atraumatic Neck: no carotid bruit, no JVD and supple, symmetrical, trachea midline Cardio: RRR, grade III/VI decrescendo murmur GI: Soft, non tender, bowel sounds present Extremities: SCDs in place. Bilateral feet warm with palpable pulses Neurologic: Grossly normal  Diagnostic Studies & Laboratory data: Lennette Bihari, MD (Primary) on 04/02/2018:    Procedures   AORTIC ARCH ANGIOGRAPHY  RIGHT/LEFT HEART CATH AND CORONARY ANGIOGRAPHY  Conclusion     There is mild left ventricular systolic dysfunction.  LV end diastolic pressure is normal.  The left ventricular ejection fraction is 45-50% by visual estimate.  There is severe (4+) aortic regurgitation.   Normal right heart pressures.  Mild global LV dysfunction with an EF 45 to 50%.  Bicuspid aortic valve with severe 4+ aortic insufficiency, mild aortic stenosis.  Dilated aortic root.  Normal coronary arteries and a left dominant circulation.  RECOMMENDATION: The patient will ultimately need surgical consultation for aortic valve repair/replacement.  ECHOCARDIOGRAM REPORT       Patient Name:   Kevin Rojas Date  of Exam: 03/25/2018 Medical Rec #:  [id number]     Height:       70.0 in Accession #:    [id number]    Weight:       159.0 lb Date of Birth:  10-16-1955    BSA:          1.89 m Patient Age:    62 years      BP:           122/64 mmHg Patient Gender: M             HR:           45 bpm. Exam Location:  Inpatient    Procedure: 2D Echo, Color Doppler and Limited Color Doppler  Indications:    Cardiac Arrest [facility])   History:        Patient has no prior history of Echocardiogram examinations.                 Cardiac Arrest, Increased Troponins, Aspiration Pneumonia, Left                 Bundle Branch Block, Murmur.   Sonographer:    Belva Chimes RDCS (AE) Referring Phys: [id number] PAULA B SIMPSON  IMPRESSIONS    1. The left ventricle has mildly reduced systolic function, with an ejection  fraction of 45-50%. The cavity size was mildly dilated. Left ventricular diastolic Doppler parameters are consistent with pseudonormalization.  2. The right ventricle has normal systolic function. The cavity was normal. There is no increase in right ventricular wall thickness.  3. Right atrial size was mildly dilated.  4. Trivial pericardial effusion is present.  5. Mild thickening of the mitral valve leaflet.  6. The aortic valveis bicuspid Aortic valve regurgitation is severe by color flow Doppler.  7. The aortic valve appears to be a bicuspid or functionally bicuspid AV. There is severe AI.     The aortic root is mildly dilated.  8. There is mild dilatation of the ascending aorta.  9.     The aortic valve appears to be a bicuspid or functionally bicuspid AV. There is severe AI.     The aortic root is mildly dilated .     There is concern for a Type A aortic dissection.     Suggest STAT CT angiogram of the chest for further evaluation.           10. The inferior vena cava was normal in size with <50% respiratory variability. 11. The interatrial septum was not assessed.  FINDINGS  Left Ventricle: The left ventricle has mildly reduced systolic function, with an ejection fraction of 45-50%. The cavity size was mildly dilated. There is no increase in left ventricular wall thickness. Left ventricular diastolic Doppler parameters are  consistent with pseudonormalization. Right Ventricle: The right ventricle has normal systolic function. The cavity was normal. There is no increase in right ventricular wall thickness. Left Atrium: left atrial size was normal in size Right Atrium: right atrial size was mildly dilated. Right atrial pressure is estimated at 8 mmHg. Interatrial Septum: The interatrial septum was not assessed. Pericardium: Trivial pericardial effusion is present. Mitral Valve: The mitral valve is normal in structure. Mild thickening of the mitral valve leaflet. Mitral valve  regurgitation is trivial by color flow Doppler. Tricuspid Valve: The tricuspid valve is normal in structure. Tricuspid valve regurgitation is mild by color flow Doppler. Aortic Valve: The aortic valveis bicuspid Aortic valve regurgitation is severe by color  flow Doppler. The aortic valve appears to be a bicuspid or functionally bicuspid AV. There is severe AI. The aortic root is mildly dilated.   Pulmonic Valve: The pulmonic valve was normal in structure. Pulmonic valve regurgitation is not visualized by color flow Doppler. Aorta: There is mild dilatation of the ascending aorta. The aortic valve appears to be a bicuspid or functionally bicuspid AV. There is severe AI. The aortic root is mildly dilated . There is concern for a Type A aortic dissection. Suggest STAT CT angiogram of the chest for further evaluation.   CLINICAL DATA:  Initial evaluation for acute dyspnea.  EXAM: PORTABLE CHEST 1 VIEW  COMPARISON:  Prior radiograph from 03/26/2018.  FINDINGS: Endotracheal tube in place with tip positioned 3.2 cm above the carina. Left subclavian approach CVC in place with tip overlying the distal SVC, stable. Enteric tube courses into the abdomen. Stable cardiomegaly. Mediastinal silhouette within normal limits.  Lungs hypoinflated. Veiling opacity overlying the hemidiaphragms compatible with layering effusions, right greater than left. Bilateral parenchymal infiltrates again seen involving the lungs bilaterally, right greater than left, perhaps mildly improved at the right upper lung as compared to previous. Underlying mild diffuse pulmonary interstitial congestion. No pneumothorax.  Osseous structures unchanged.   Recent Radiology Findings:  Ct Head Wo Contrast  Result Date: 03/24/2018 CLINICAL DATA:  Cardiac arrest EXAM: CT HEAD WITHOUT CONTRAST TECHNIQUE: Contiguous axial images were obtained from the base of the skull through the vertex without intravenous contrast.  COMPARISON:  None. FINDINGS: Brain: No acute territorial infarction, hemorrhage or intracranial mass. The ventricles are of normal size Vascular: No hyperdense vessels.  No unexpected calcification Skull: Normal. Negative for fracture or focal lesion. Sinuses/Orbits: Mucosal thickening in the ethmoid sinuses. Large amount of secretions within the posterior nasopharynx with scattered hyperdense foci, possibly hemorrhage. Other: None IMPRESSION: 1. Negative non contrasted CT appearance of the brain 2. Large amount of secretions at the posterior nasopharynx, possibly with small amount of hemorrhagic secretion. Electronically Signed   By: Jasmine Pang M.D.   On: 03/24/2018 22:39   Ct Angio Chest Pe W Or Wo Contrast  Result Date: 03/24/2018 CLINICAL DATA:  Cardiac arrest.  Intubated patient. EXAM: CT ANGIOGRAPHY CHEST WITH CONTRAST TECHNIQUE: Multidetector CT imaging of the chest was performed using the standard protocol during bolus administration of intravenous contrast. Multiplanar CT image reconstructions and MIPs were obtained to evaluate the vascular anatomy. CONTRAST:  39mL OMNIPAQUE IOHEXOL 350 MG/ML SOLN COMPARISON:  Current chest radiographs. FINDINGS: Cardiovascular: There is satisfactory opacification of the pulmonary arteries to the segmental level. There is no evidence of a pulmonary embolism. Heart is mildly enlarged. No pericardial effusion. No coronary artery calcifications. Ascending aorta is dilated to 4.4 cm. Aorta is not opacified. No atherosclerotic calcifications. Mediastinum/Nodes: No neck base, no neck base or axillary masses or enlarged lymph nodes. Endotracheal tube and nasal/orogastric tube are well positioned. Subcentimeter shotty mediastinal lymph nodes. No discrete enlarged mediastinal or hilar lymph nodes. No masses. Trachea is unremarkable. Lungs/Pleura: Bilateral dependent lung consolidation. Airspace consolidation is noted in the posterior right upper lobe with a lesser degree of  posterior consolidation in the right lower lobe. There is less extensive consolidation in the posterior left upper lobe and left lower lobe. No evidence of pulmonary edema. No pleural effusion and no pneumothorax. Upper Abdomen: No acute abnormality. Musculoskeletal: No chest wall abnormality. No acute or significant osseous findings. Review of the MIP images confirms the above findings. IMPRESSION: 1. No evidence of a pulmonary embolus.  2. Dependent consolidation in both lungs, right greater than left. A significant portion of this is likely due to multifocal pneumonia. Some of this dependent opacity, particularly in the lower lobes, is likely atelectasis. 3. There is no evidence of pulmonary edema.  No pleural effusion. 4. Endotracheal and nasogastric tubes are well position. 5. Mild cardiomegaly. 6. Dilated ascending aorta to 4.2 cm. Recommend annual imaging followup by CTA or MRA. This recommendation follows 2010 ACCF/AHA/AATS/ACR/ASA/SCA/SCAI/SIR/STS/SVM Guidelines for the Diagnosis and Management of Patients with Thoracic Aortic Disease. Circulation. 2010; 121: Z610-R604. Aortic aneurysm NOS (ICD10-I71.9) Aortic aneurysm NOS (ICD10-I71.9). Electronically Signed   By: Amie Portland M.D.   On: 03/24/2018 22:44    D Vas US Doppler Pre Cabg  Result Date: 04/02/2018 PREOPERATIVE VASCULAR EVALUATION  Indications: Pre cabg. Performing Technologist: Olen Cordial Rvt  Examination Guidelines: A complete evaluation includes B-mode imaging, spectral Doppler, color Doppler, and power Doppler as needed of all accessible portions of each vessel. Bilateral testing is considered an integral part of a complete examination. Limited examinations for reoccurring indications may be performed as noted.  Right Carotid Findings: +----------+--------+--------+--------+-----------+--------+             PSV cm/s EDV cm/s Stenosis Describe    Comments  +----------+--------+--------+--------+-----------+--------+  CCA Prox   91                          homogeneous           +----------+--------+--------+--------+-----------+--------+  CCA Distal 63       12                homogeneous           +----------+--------+--------+--------+-----------+--------+  ICA Prox   67       17       1-39%    homogeneous           +----------+--------+--------+--------+-----------+--------+  ICA Distal 69       17                                      +----------+--------+--------+--------+-----------+--------+  ECA        106                                              +----------+--------+--------+--------+-----------+--------+ +----------+--------+-------+--------+------------+             PSV cm/s EDV cms Describe Arm Pressure  +----------+--------+-------+--------+------------+  Subclavian 118                       119           +----------+--------+-------+--------+------------+ +---------+--------+--+--------+-+---------+  Vertebral PSV cm/s 51 EDV cm/s 8 Antegrade  +---------+--------+--+--------+-+---------+ Left Carotid Findings: +----------+--------+--------+--------+-----------+--------+             PSV cm/s EDV cm/s Stenosis Describe    Comments  +----------+--------+--------+--------+-----------+--------+  CCA Prox   87                         homogeneous           +----------+--------+--------+--------+-----------+--------+  CCA Distal 93  homogeneous           +----------+--------+--------+--------+-----------+--------+  ICA Prox   68       12       1-39%    homogeneous           +----------+--------+--------+--------+-----------+--------+  ICA Distal 37       12                                      +----------+--------+--------+--------+-----------+--------+  ECA        83                                               +----------+--------+--------+--------+-----------+--------+ +----------+--------+--------+--------+------------+  Subclavian PSV cm/s EDV cm/s Describe Arm Pressure   +----------+--------+--------+--------+------------+             127                        127           +----------+--------+--------+--------+------------+ +---------+--------+--+--------+--+---------+  Vertebral PSV cm/s 49 EDV cm/s 13 Antegrade  +---------+--------+--+--------+--+---------+  ABI Findings: +--------+------------------+-----+---------+--------+  Right    Rt Pressure (mmHg) Index Waveform  Comment   +--------+------------------+-----+---------+--------+  Brachial 119                      triphasic           +--------+------------------+-----+---------+--------+  PTA      175                1.38  triphasic           +--------+------------------+-----+---------+--------+  DP       193                1.52  triphasic           +--------+------------------+-----+---------+--------+ +--------+------------------+-----+---------+-------+  Left     Lt Pressure (mmHg) Index Waveform  Comment  +--------+------------------+-----+---------+-------+  Brachial 127                      triphasic          +--------+------------------+-----+---------+-------+  PTA      180                1.42  triphasic          +--------+------------------+-----+---------+-------+  DP       197                1.55  triphasic          +--------+------------------+-----+---------+-------+ +-------+---------------+----------------+  ABI/TBI Today's ABI/TBI Previous ABI/TBI  +-------+---------------+----------------+  Right   1.52                              +-------+---------------+----------------+  Left    1.55                              +-------+---------------+----------------+  Right Doppler Findings: +-----------+--------+-----+---------+-----------------------------------------+  Site        Pressure Index Doppler   Comments                                   +-----------+--------+-----+---------+-----------------------------------------+  Brachial    119            triphasic                                             +-----------+--------+-----+---------+-----------------------------------------+  Radial                     triphasic                                            +-----------+--------+-----+---------+-----------------------------------------+  Ulnar                      triphasic                                            +-----------+--------+-----+---------+-----------------------------------------+  Palmar Arch                          Palmar waveforms are obliterated with                                            radial and ulnar compression.              +-----------+--------+-----+---------+-----------------------------------------+  Left Doppler Findings: +-----------+--------+-----+---------+-----------------------------------------+  Site        Pressure Index Doppler   Comments                                   +-----------+--------+-----+---------+-----------------------------------------+  Brachial    127            triphasic                                            +-----------+--------+-----+---------+-----------------------------------------+  Radial                     triphasic                                            +-----------+--------+-----+---------+-----------------------------------------+  Ulnar                      triphasic                                            +-----------+--------+-----+---------+-----------------------------------------+  Palmar Arch                          Palmar waveforms are obliterated with  radial and ulnar compression.              +-----------+--------+-----+---------+-----------------------------------------+  Summary: Right Carotid: Velocities in the right ICA are consistent with a 1-39% stenosis. Left Carotid: Velocities in the left ICA are consistent with a 1-39% stenosis. Vertebrals: Bilateral vertebral arteries demonstrate antegrade flow. Right ABI: Resting right ankle-brachial index indicates  noncompressible right lower extremity arteries. Left ABI: Resting left ankle-brachial index indicates noncompressible left lower extremity arteries.  Electronically signed by Sherald Hess MD on 04/02/2018 at 5:46:33 PM.    Final      I have independently reviewed the above radiologic studies and discussed with the patient   Recent Lab Findings: Lab Results  Component Value Date   WBC 9.9 04/02/2018   HGB 12.8 (L) 04/02/2018   HCT 40.3 04/02/2018   PLT 276 04/02/2018   GLUCOSE 91 04/02/2018   ALT 40 03/31/2018   AST 23 03/31/2018   NA 140 04/02/2018   K 3.8 04/02/2018   CL 107 04/02/2018   CREATININE 1.33 (H) 04/02/2018   BUN 19 04/02/2018   CO2 23 04/02/2018   TSH 1.863 03/25/2018   INR 1.3 (H) 03/25/2018   HGBA1C 5.3 03/24/2018    Assessment / Plan:   1. Severe aortic regurgtation-s/p V fib arrest on 03/17. He as found to have a bicuspid aortic valve and an ascending aorta that is dilated to 4.4 cm.  2. Possible aspiration pneumonia-has been on Unasyn 3. AKI-creatinine down to 1.33 today Estimated Creatinine Clearance: 52.3 mL/min (A) (by C-G formula based on SCr of 1.33 mg/dL (H)). Chronic Kidney Disease   Stage I     GFR >90  Stage II    GFR 60-89  Stage IIIA GFR 45-59  Stage IIIB GFR 30-44  Stage IV   GFR 15-29  Stage V    GFR  <15  Lab Results  Component Value Date   CREATININE 1.33 (H) 04/02/2018   Estimated Creatinine Clearance: 52.3 mL/min (A) (by C-G formula based on SCr of 1.33 mg/dL (H)).   Patient has been seen history and studies including CT to rule out PE, cardiac cath, chest x-ray and echocardiogram and cardiac cath have been reviewed and discussed with the patient and his wife. With his decreased LV function, severe aortic insufficiency LVIDd:         5.20 cm        LVIDs:         3.50 cm , needs aortic valve replacement and probable aortic root and/or a sending aortic replacement.  Would seem appropriate for the patient to proceed to inpatient  cardiac rehab for short period of time for further recovery from his recent at home cardiac arrest, but before discharge home return as an inpatient for cardiac surgery.  I discussed with the patient mechanical versus tissue valve replacement.  His wife has lived many years with a mechanical aortic valve.   He is to be seen by EP, consider placement of AICD following cardiac surgery.  Delight Ovens MD      [address] [place].[address] [place] [address] Office [phone]   Beeper [phone]

## 2018-04-02 NOTE — Progress Notes (Addendum)
Progress Note  Patient Name: Kevin Rojas Date of Encounter: 04/02/2018  Primary Cardiologist: Kevin Bollman, MD   Subjective   Had uneventful coronary angiography this morning.  The procedure confirms severe aortic regurgitation and absence of underlying coronary disease.  The reason for ventricular fibrillation is even more mysterious.  Perhaps it is related to the hemodynamic and functional impairment related to severe aortic regurgitation.  Certainly the aortic valve will need to be repaired/replaced.  I have consulted Dr. Sheliah Rojas.  I have spoken to the family, wife Kevin Rojas.   Inpatient Medications    Scheduled Meds: . chlorhexidine  15 mL Mouth Rinse BID  . feeding supplement (ENSURE ENLIVE)  237 mL Oral BID BM  . [START ON 04/03/2018] heparin  5,000 Units Subcutaneous Q8H  . mouth rinse  15 mL Mouth Rinse q12n4p  . pantoprazole  40 mg Oral Q1200  . sodium chloride flush  3 mL Intravenous Q12H   Continuous Infusions: . sodium chloride 100 mL/hr at 04/02/18 1017  . sodium chloride     PRN Meds: sodium chloride, acetaminophen, albuterol, diazepam, hydrALAZINE, hydrALAZINE, labetalol, ondansetron (ZOFRAN) IV, sodium chloride flush   Vital Signs    Vitals:   04/02/18 0938 04/02/18 1006 04/02/18 1021 04/02/18 1023  BP: 140/81 138/79 137/83 138/79  Pulse: (!) 56 61 (!) 59 (!) 58  Resp: (!) 21 (!) 22 19 (!) 24  Temp:    97.9 F (36.6 C)  TempSrc:    Oral  SpO2: 98% 98% 99% 100%  Weight:      Height:        Intake/Output Summary (Last 24 hours) at 04/02/2018 1124 Last data filed at 04/02/2018 1017 Gross per 24 hour  Intake 1516.85 ml  Output 500 ml  Net 1016.85 ml   Last 3 Weights 04/02/2018 04/01/2018 03/31/2018  Weight (lbs) 141 lb 8 oz 142 lb 1.6 oz 143 lb 11.2 oz  Weight (kg) 64.184 kg 64.456 kg 65.182 kg      Telemetry    Normal sinus rhythm- Personally Reviewed  ECG    No recent EKG- Personally Reviewed  Physical Exam  Lying in bed on left  side in no distress. GEN:  Appears healthy Neck: No JVD Cardiac: RRR, 2/6 to 3/6 decrescendo aortic regurgitation murmur, with S4 gallop.  No rubs, or S3 gallops.  Respiratory: Clear to auscultation bilaterally. GI: Soft, nontender, non-distended  MS:  Right wrist, left antecubital access sites unremarkable. Neuro:  Nonfocal  Psych: Normal affect   Labs    Chemistry Recent Labs  Lab 03/30/18 0344 03/31/18 0302 04/01/18 0305 04/02/18 0243 04/02/18 1048  NA 139 141 140 140  --   K 3.8 3.8 3.8 3.8  --   CL 105 108 106 107  --   CO2 23 23 24 23   --   GLUCOSE 105* 104* 85 91  --   BUN 24* 22 20 19   --   CREATININE 1.35* 1.41* 1.40* 1.36* 1.33*  CALCIUM 9.1 8.9 8.8* 8.7*  --   PROT  --  7.2  --   --   --   ALBUMIN 3.0* 2.8*  --   --   --   AST  --  23  --   --   --   ALT  --  40  --   --   --   ALKPHOS  --  70  --   --   --   BILITOT  --  1.4*  --   --   --  GFRNONAA 56* 53* 53* 55* 57*  GFRAA >60 >60 >60 >60 >60  ANIONGAP 11 10 10 10   --      Hematology Recent Labs  Lab 04/01/18 0305 04/02/18 0243 04/02/18 1048  WBC 11.5* 10.4 9.9  RBC 5.03 4.61 4.74  HGB 13.9 12.3* 12.8*  HCT 42.6 38.9* 40.3  MCV 84.7 84.4 85.0  MCH 27.6 26.7 27.0  MCHC 32.6 31.6 31.8  RDW 14.1 13.8 13.9  PLT 240 251 276    Cardiac Enzymes No results for input(s): TROPONINI in the last 168 hours. No results for input(s): TROPIPOC in the last 168 hours.   BNPNo results for input(s): BNP, PROBNP in the last 168 hours.   DDimer No results for input(s): DDIMER in the last 168 hours.   Radiology    No results found.  Cardiac Studies   Right and left heart catheterization with coronary angiography: 04/02/2018  Normal right heart pressures.  Mild global LV dysfunction with an EF 45 to 50%.  Bicuspid aortic valve with severe 4+ aortic insufficiency, mild aortic stenosis.  Dilated aortic root.  Normal coronary arteries and a left dominant circulation.  Right Heart Pressures RA:  A-wave 7, V wave 4; mean 4 RV: 27/6 PA: 23/9; mean 15 PW: A-wave 11 V wave 10; mean 9  Initial AO 124/71. Initial LV: 144/9  Pullback: LV: 148/8 Ao: 133/70  Oxygen saturation in the pulmonary artery 74% in the aorta 96%.  By the [facility] method cardiac output 6.5 L/min and cardiac index 3.6 L/min/m.  Mild aortic stenosis with a probable congenital bicuspid valve with a peak to peak gradient of 15 mm Hg, mean gradient of 14 mm Hg and calculated valve area 2.3 cm.  Severe 4+ aortic insufficiency.  EF estimate 45 to 50% as assessed by supravalvular aortography/AR  with LVEDP at 9 mm.       2D Doppler echocardiogram 03/25/2018:  IMPRESSIONS 1. The left ventricle has mildly reduced systolic function, with an ejection fraction of 45-50%. The cavity size was mildly dilated. Left ventricular diastolic Doppler parameters are consistent with pseudonormalization.  2. The right ventricle has normal systolic function. The cavity was normal. There is no increase in right ventricular wall thickness.  3. Right atrial size was mildly dilated.  4. Trivial pericardial effusion is present.  5. Mild thickening of the mitral valve leaflet.  6. The aortic valveis bicuspid Aortic valve regurgitation is severe by color flow Doppler.  7. The aortic valve appears to be a bicuspid or functionally bicuspid AV. There is severe AI.     The aortic root is mildly dilated.  8. There is mild dilatation of the ascending aorta.  9.     The aortic valve appears to be a bicuspid or functionally bicuspid AV. There is severe AI.     The aortic root is mildly dilated .     There is concern for a Type A aortic dissection.     Suggest STAT CT angiogram of the chest for further evaluation. 10. The inferior vena cava was normal in size with <50% respiratory variability. 11. The interatrial septum was not assessed.  Patient Profile     63 y.o. male with ventricular fibrillation cardiac arrest, successfully resuscitated.   Work-up has included identification of severe aortic regurgitation with LV enlargement.  Postarrest the patient has slowly progressed and cognitive impairment and functional ability is significantly improved.  Amiodarone was discontinued on 318/2020 due to severe bradycardia while on hypothermia protocol.  Cardiac  cath demonstrates widely patent coronaries with left dominant anatomy.  Normal pulmonary pressures and documentation of severe aortic regurgitation.  Assessment & Plan    1. Ventricular fibrillation cardiac arrest.  Unknown etiology.  Has mild LV dysfunction and and hypokalemia, 3.2 on presentation.  Will get EP evaluation and need to answer the question of whether LifeVest and or permanent defibrillator will be required.  The patient has chronic left bundle branch block QRS duration 136 ms. 2. Severe aortic regurgitation and mild aortic stenosis of bicuspid valve, not previously recognized before admission.  Have contacted Dr. Sheliah Rojas to consider surgical therapy. 3. Acute on chronic combined systolic and diastolic heart failure, without current evidence of volume overload.  Will assess kidney function in a.m. and consider adding low-dose ACE inhibitor or angiotensin receptor blocker therapy. 4. Stage III chronic kidney disease, stage III.  Reassess function in a.m. and add RAAS if possible. 5. Physical deconditioning, improving 6. Anoxic brain injury, markedly improved. 7. Non-ST elevation MI, is consistent with myocardial infarction in the absence of obstructive coronary artery disease, MINOCA.   Surgical consultation with Dr. Sheliah Rojas is been requested.  EP consultation has also been requested to manage potential for future VF events.  May need to have defibrillator implantation.  Renin aldosterone angiotensin system RAAS) blockade as tolerated by kidneys, consider initiating tomorrow.  Currently avoiding beta-blocker therapy because of relative bradycardia at baseline  and fear that further slowing heart rate will cause further LV enlargement and deterioration.     For questions or updates, please contact [facility] Please consult [email or web address] for contact info under        Signed, Lesleigh Noe, MD  04/02/2018, 11:24 AM

## 2018-04-02 NOTE — Evaluation (Signed)
Speech Language Pathology Evaluation Patient Details Name: Kevin Rojas MRN: [id number] DOB: 1955-03-03 Today's Date: 04/02/2018 Time: 8875-7972 SLP Time Calculation (min) (ACUTE ONLY): 23 min  Problem List:  Patient Active Problem List   Diagnosis Date Noted  . Acute respiratory failure with hypoxia (HCC)   . Aspiration into airway   . Severe aortic insufficiency 03/26/2018  . Cardiac arrest (HCC) 03/24/2018  . LBBB (left bundle branch block) 03/24/2018  . Elevated troponin 03/24/2018  . Hypokalemia 03/24/2018  . Endotracheally intubated 03/24/2018  . Abnormal CXR 03/24/2018  . Aspiration pneumonia (HCC) 03/24/2018   Past Medical History: History reviewed. No pertinent past medical history. Past Surgical History: History reviewed. No pertinent surgical history. HPI:  63 year old man admitted 3/17 with V. fib arrest from home.  He has a history of chronic left bundle branch block. Underwent hypothermia protocol; vomited around [facility] airway. Intubated in ER 3/17-3/21.  NG placed 3/21 for stomach decompression due to ongoing vomiting.     Assessment / Plan / Recommendation Clinical Impression   Patient presents with mild-moderate cognitive communication deficit, with impairments noted in recall, selective/higher level attention, problem solving, executive functioning and awareness. Pt scored 19/30 on MOCA- Basic (26 and above is considered within normal limits). Pt immediately recalled 4/5 words; after a 5 minute delay pt initially did not recall that SLP asked him to remember words, but eventually recalled 1/5. Pt with impaired verbal and functional problem solving. When son called pt, pt answered the phone with receiver to ear and mouthpiece behind his head, without awareness. Required SLP assist to reposition phone when son unable to hear him. Pt struggled with trailmaking due to impaired alternating attention. 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    SLP Assessment  SLP Recommendation/Assessment: Patient needs continued Speech Lanaguage Pathology Services SLP Visit Diagnosis: Cognitive communication deficit (R41.841)    Follow Up Recommendations  Inpatient Rehab    Frequency and Duration min 2x/week         SLP Evaluation Cognition  Overall Cognitive Status: Impaired/Different from baseline Arousal/Alertness: Awake/alert Orientation Level: Oriented to person;Oriented to place;Oriented to time;Disoriented to situation Attention: Selective Selective Attention: Impaired Selective Attention Impairment: Verbal complex;Functional complex Memory: Impaired Memory Impairment: Decreased recall of new information;Decreased short term memory(immediate recall 4/5, delayed recall 1/5) Decreased Short Term Memory: Verbal basic;Functional basic Awareness: Impaired Awareness Impairment: Intellectual impairment(somewhat aware of memory deficit) Problem Solving: Impaired Problem Solving Impairment: Verbal basic(slow processing, extended time, 2/3) Executive Function: Organizing;Sequencing Sequencing: Impaired Sequencing Impairment: Music therapist: Impaired Organizing Impairment: Functional basic Safety/Judgment: Impaired Comments: decreased awareness       Comprehension  Auditory Comprehension Overall Auditory Comprehension: Appears within functional limits for tasks assessed Yes/No Questions: Within Functional Limits Commands: Within Functional Limits Conversation: Simple Interfering Components: Attention;Processing speed Visual Recognition/Discrimination Discrimination: Not tested Reading Comprehension Reading Status: Not  tested    Expression Expression Primary Mode of Expression: Verbal Verbal Expression Overall Verbal Expression: Appears within functional limits for tasks assessed Naming: Impairment Divergent: 25-49% accurate(5 fruits in 60 seconds; attn vs language) Pragmatics: No impairment Written Expression Dominant Hand: Right Written Expression: Unable to assess (comment)(pt not to use R hand  due to cath)   Oral / Motor  Oral Motor/Sensory Function Overall Oral Motor/Sensory Function: Within functional limits Motor Speech Overall Motor Speech: Appears within functional limits for tasks assessed   GO                   Rondel Baton, MS, CCC-SLP Speech-Language Pathologist Acute Rehabilitation Services Pager: [phone] Office: [phone]  Arlana Lindau 04/02/2018, 1:30 PM

## 2018-04-02 NOTE — Progress Notes (Signed)
PT Cancellation Note  Patient Details Name: Kevin Rojas MRN: [id number] DOB: 06-Sep-1955   Cancelled Treatment:    Reason Eval/Treat Not Completed: Patient at procedure or test/unavailable (to cath lab). Will follow-up for PT treatment as schedule permits.  Ina Homes, PT, DPT Acute Rehabilitation Services  Pager [phone] Office [phone]  Malachy Chamber 04/02/2018, 7:47 AM

## 2018-04-02 NOTE — Progress Notes (Signed)
Spoke with Joni Reining, specialist in Infection Prevention, Patient tested + for coronovirus NL63.  She states it is the common cold, so only standard precautions are needed.

## 2018-04-02 NOTE — Progress Notes (Signed)
Pre-CABG testing has been completed. Preliminary results can be found in CV Proc through chart review.   04/02/18 3:24 PM Olen Cordial RVT

## 2018-04-02 NOTE — Progress Notes (Signed)
PROGRESS NOTE    Kevin Rojas  AFB:[id number] DOB: 1955-05-26 DOA: 03/24/2018 PCP: [name], MD     Brief Narrative:  Kevin Rojas is a 63 year old male with past medical history significant for nephrolithiasis.  Patient presented from home with Vfib cardiac arrest on 3/17. CPR started by wife. Found in Vfib shocked twice and king airway placed. Vomited around [facility] airway. Intubated and stable in ER but not purposeful. Hypothermia protocol started. EKG noted for LBBB. PCCM admitted the patient.  Patient has been extubated.  Patient was transferred to the hospitalist team on 03/29/2018.  Problems include encephalopathy that is resolving, possible aspiration pneumonia, decreased ejection fraction that could be related to the V. fib arrest and cardioversion, acute kidney injury that is resolving, likely bicuspid aortic valve with severe regurgitation and elevated troponin likely secondary to above.   New events last 24 hours / Subjective: No complaints of chest pain, shortness of breath today   Assessment & Plan:   Principal Problem:   Cardiac arrest [facility]) Active Problems:   LBBB (left bundle branch block)   Elevated troponin   Hypokalemia   Endotracheally intubated   Abnormal CXR   Aspiration pneumonia (HCC)   Severe aortic insufficiency   Acute respiratory failure with hypoxia (HCC)   Aspiration into airway   V. fib arrest Bicuspid aortic valve with severe regurgitation Newly diagnosed acute combined systolic and diastolic congestive heart failure Chronic LBBB Status post intubation and cooling protocol Cardiology planning for heart cath 3/26   Anoxic encephalopathy Improved  Coronavirus NL63 URI - NOT SARS-CoV2 Improved  Possible aspiration pneumonia Completed unasyn 7 days   AKI Due to cardiogenic shock in setting of V fib Cr stable at 1.36     DVT prophylaxis: Subq hep Code Status: Full Family Communication: No family at bedside  Disposition Plan:  CIR eval pending. Cath 3/26   Consultants:   PCCM admit  Cardiology    Antimicrobials:  Anti-infectives (From admission, onward)   Start     Dose/Rate Route Frequency Ordered Stop   03/31/18 1145  Ampicillin-Sulbactam (UNASYN) 3 g in sodium chloride 0.9 % 100 mL IVPB  Status:  Discontinued     3 g 200 mL/hr over 30 Minutes Intravenous Every 6 hours 03/31/18 1143 03/31/18 1143   03/29/18 1600  Ampicillin-Sulbactam (UNASYN) 3 g in sodium chloride 0.9 % 100 mL IVPB     3 g 200 mL/hr over 30 Minutes Intravenous Every 6 hours 03/29/18 1508 03/30/18 2339   03/25/18 0800  Ampicillin-Sulbactam (UNASYN) 3 g in sodium chloride 0.9 % 100 mL IVPB     3 g 200 mL/hr over 30 Minutes Intravenous Every 6 hours 03/25/18 0721 03/28/18 2107   03/24/18 2300  Ampicillin-Sulbactam (UNASYN) 3 g in sodium chloride 0.9 % 100 mL IVPB  Status:  Discontinued     3 g 200 mL/hr over 30 Minutes Intravenous Every 6 hours 03/24/18 2211 03/25/18 0721   03/24/18 2030  cefTRIAXone (ROCEPHIN) 1 g in sodium chloride 0.9 % 100 mL IVPB  Status:  Discontinued     1 g 200 mL/hr over 30 Minutes Intravenous Every 24 hours 03/24/18 2025 03/24/18 2208   03/24/18 2030  azithromycin (ZITHROMAX) 500 mg in sodium chloride 0.9 % 250 mL IVPB  Status:  Discontinued     500 mg 250 mL/hr over 60 Minutes Intravenous Every 24 hours 03/24/18 2025 03/25/18 1003       Objective: Vitals:   04/02/18 2919 04/02/18 0930 04/02/18 0933 04/02/18 1660  BP: 138/78 138/78 138/78 140/81  Pulse: (!) 58 (!) 57 62 (!) 56  Resp: (!) 23 17 15  (!) 21  Temp:      TempSrc:      SpO2: 97% 97% 98% 98%  Weight:      Height:        Intake/Output Summary (Last 24 hours) at 04/02/2018 1015 Last data filed at 04/02/2018 0600 Gross per 24 hour  Intake 1516.85 ml  Output 500 ml  Net 1016.85 ml   Filed Weights   03/31/18 0625 04/01/18 0522 04/02/18 0455  Weight: 65.2 kg 64.5 kg 64.2 kg    Examination: General exam: Appears calm and comfortable   Respiratory system: Clear to auscultation. Respiratory effort normal. Cardiovascular system: S1 & S2 heard, RRR. No JVD, murmurs, rubs, gallops or clicks. No pedal edema. Gastrointestinal system: Abdomen is nondistended, soft and nontender. No organomegaly or masses felt. Normal bowel sounds heard. Central nervous system: Alert and oriented. No focal neurological deficits. Extremities: Symmetric 5 x 5 power. Skin: No rashes, lesions or ulcers Psychiatry: Judgement and insight appear normal. Mood & affect appropriate.    Data Reviewed: I have personally reviewed following labs and imaging studies  CBC: Recent Labs  Lab 03/29/18 0523 03/30/18 0344 03/31/18 0302 04/01/18 0305 04/02/18 0243  WBC 17.2* 13.3* 11.9* 11.5* 10.4  NEUTROABS  --  10.4*  --   --   --   HGB 12.8* 13.6 13.2 13.9 12.3*  HCT 38.8* 40.9 41.3 42.6 38.9*  MCV 84.2 83.6 84.3 84.7 84.4  PLT 215 219 242 240 251   Basic Metabolic Panel: Recent Labs  Lab 03/26/18 1616  03/27/18 0426  03/28/18 0443 03/29/18 0523 03/30/18 0344 03/31/18 0302 04/01/18 0305 04/02/18 0243  Kevin 145   < > 141   < > 137 138 139 141 140 140  K 4.3   < > 4.1   < > 4.9 4.2 3.8 3.8 3.8 3.8  CL 122*   < > 119*  --  110 104 105 108 106 107  CO2 17*   < > 16*  --  18* 24 23 23 24 23   GLUCOSE 108*   < > 115*  --  103* 106* 105* 104* 85 91  BUN 10   < > 10  --  15 24* 24* 22 20 19   CREATININE 1.51*   < > 1.35*  --  1.23 1.46* 1.35* 1.41* 1.40* 1.36*  CALCIUM 8.0*   < > 8.0*  --  8.9 8.9 9.1 8.9 8.8* 8.7*  MG 2.1  --  2.0  --  2.0  --  2.3 2.3 2.2 2.0  PHOS 2.3*  --  2.8  --  5.4*  --  3.0  --   --   --    < > = values in this interval not displayed.   GFR: Estimated Creatinine Clearance: 51.1 mL/min (A) (by C-G formula based on SCr of 1.36 mg/dL (H)). Liver Function Tests: Recent Labs  Lab 03/30/18 0344 03/31/18 0302  AST  --  23  ALT  --  40  ALKPHOS  --  70  BILITOT  --  1.4*  PROT  --  7.2  ALBUMIN 3.0* 2.8*   No results for  input(s): LIPASE, AMYLASE in the last 168 hours. No results for input(s): AMMONIA in the last 168 hours. Coagulation Profile: No results for input(s): INR, PROTIME in the last 168 hours. Cardiac Enzymes: No results for input(s): CKTOTAL, CKMB, CKMBINDEX, TROPONINI  in the last 168 hours. BNP (last 3 results) No results for input(s): PROBNP in the last 8760 hours. HbA1C: No results for input(s): HGBA1C in the last 72 hours. CBG: Recent Labs  Lab 03/31/18 1648 03/31/18 2044 04/01/18 0738 04/01/18 1137 04/01/18 1729  GLUCAP 112* 84 98 100* 97   Lipid Profile: No results for input(s): CHOL, HDL, LDLCALC, TRIG, CHOLHDL, LDLDIRECT in the last 72 hours. Thyroid Function Tests: No results for input(s): TSH, T4TOTAL, FREET4, T3FREE, THYROIDAB in the last 72 hours. Anemia Panel: No results for input(s): VITAMINB12, FOLATE, FERRITIN, TIBC, IRON, RETICCTPCT in the last 72 hours. Sepsis Labs: No results for input(s): PROCALCITON, LATICACIDVEN in the last 168 hours.  Recent Results (from the past 240 hour(s))  Respiratory Panel by PCR     Status: Abnormal   Collection Time: 03/24/18 10:07 PM  Result Value Ref Range Status   Adenovirus NOT DETECTED NOT DETECTED Final   Coronavirus 229E NOT DETECTED NOT DETECTED Final    Comment: (NOTE) The Coronavirus on the Respiratory Panel, DOES NOT test for the novel  Coronavirus (2019 nCoV)    Coronavirus HKU1 NOT DETECTED NOT DETECTED Final   Coronavirus NL63 DETECTED (A) NOT DETECTED Final   Coronavirus OC43 NOT DETECTED NOT DETECTED Final   Metapneumovirus NOT DETECTED NOT DETECTED Final   Rhinovirus / Enterovirus NOT DETECTED NOT DETECTED Final   Influenza A NOT DETECTED NOT DETECTED Final   Influenza B NOT DETECTED NOT DETECTED Final   Parainfluenza Virus 1 NOT DETECTED NOT DETECTED Final   Parainfluenza Virus 2 NOT DETECTED NOT DETECTED Final   Parainfluenza Virus 3 NOT DETECTED NOT DETECTED Final   Parainfluenza Virus 4 NOT DETECTED NOT  DETECTED Final   Respiratory Syncytial Virus NOT DETECTED NOT DETECTED Final   Bordetella pertussis NOT DETECTED NOT DETECTED Final   Chlamydophila pneumoniae NOT DETECTED NOT DETECTED Final   Mycoplasma pneumoniae NOT DETECTED NOT DETECTED Final    Comment: Performed at [facility], [address], [place], [place] [address]  Culture, blood (routine x 2)     Status: None   Collection Time: 03/25/18 12:42 AM  Result Value Ref Range Status   Specimen Description BLOOD LEFT A-LINE  Final   Special Requests   Final    BOTTLES DRAWN AEROBIC AND ANAEROBIC Blood Culture results may not be optimal due to an excessive volume of blood received in culture bottles   Culture   Final    NO GROWTH 5 DAYS Performed at [facility], [address], [place], [place] [address]    Report Status 03/30/2018 FINAL  Final  Culture, blood (routine x 2)     Status: None   Collection Time: 03/25/18 12:44 AM  Result Value Ref Range Status   Specimen Description BLOOD LEFT HAND  Final   Special Requests   Final    BOTTLES DRAWN AEROBIC ONLY Blood Culture adequate volume   Culture   Final    NO GROWTH 5 DAYS Performed at [facility], [address] [place], [place] [address]    Report Status 03/30/2018 FINAL  Final  MRSA PCR Screening     Status: None   Collection Time: 03/25/18  1:14 AM  Result Value Ref Range Status   MRSA by PCR NEGATIVE NEGATIVE Final    Comment:        The GeneXpert MRSA Assay (FDA approved for NASAL specimens only), is one component of a comprehensive MRSA colonization surveillance program. It  is not intended to diagnose MRSA infection nor to guide or monitor treatment for MRSA infections. Performed at [facility], [address] [place], [place] [address]   Culture, respiratory (non-expectorated)     Status: None   Collection Time: 03/25/18  2:51 AM  Result Value Ref Range Status   Specimen Description TRACHEAL ASPIRATE  Final   Special Requests  NONE  Final   Gram Stain   Final    RARE WBC PRESENT, PREDOMINANTLY PMN FEW GRAM POSITIVE COCCI IN PAIRS IN CLUSTERS RARE YEAST    Culture   Final    FEW Consistent with normal respiratory flora. Performed at [facility], [address] [place], [place] [address]    Report Status 03/27/2018 FINAL  Final       Radiology Studies: No results found.    Scheduled Meds: . chlorhexidine  15 mL Mouth Rinse BID  . feeding supplement (ENSURE ENLIVE)  237 mL Oral BID BM  . heparin injection (subcutaneous)  5,000 Units Subcutaneous Q8H  . [START ON 04/03/2018] heparin  5,000 Units Subcutaneous Q8H  . mouth rinse  15 mL Mouth Rinse q12n4p  . pantoprazole  40 mg Oral Q1200  . sodium chloride flush  3 mL Intravenous Q12H   Continuous Infusions: . sodium chloride    . sodium chloride       LOS: 9 days    Time spent: 20 minutes   Noralee Stain, DO [facility] [email or web address] 04/02/2018, 10:15 AM

## 2018-04-02 NOTE — Progress Notes (Signed)
Physical Therapy Treatment Patient Details Name: Kevin Rojas MRN: [id number] DOB: 1955-03-15 Today's Date: 04/02/2018    History of Present Illness Pt is a 63 year old man admitted 03/24/18 with V fib arrest, requiring CPR. ETT 3/17-3/21. Hospital course complicated by ongoing vomiting, NGT placed 3/21. Pt is s/p LHC, coronary angiography on 3/26 which revealed severe aortic regurgitation. Cardiology following. PMH includes chronic L bundle branch block.     PT Comments    Pt progressing towards goals. Pt continues to demonstrate cognitive deficits throughout session and is easily distracted during gait. When dual tasking noted increased unsteadiness. Pt only able to recall 1/3 words stated at beginning of session by end of session from word recall task.  Current recommendations appropriate as pt remains a high fall risk. Pt very motivated to regain independence. Will continue to follow acutely to maximize functional mobility independence and safety.   Follow Up Recommendations  CIR;Supervision/Assistance - 24 hour     Equipment Recommendations  None recommended by PT    Recommendations for Other Services Rehab consult     Precautions / Restrictions Precautions Precautions: Fall;Other (comment) Precaution Comments: s/p r heart cath Restrictions Weight Bearing Restrictions: No    Mobility  Bed Mobility               General bed mobility comments: Sitting at EOB upon entry   Transfers Overall transfer level: Needs assistance Equipment used: None Transfers: Sit to/from Stand Sit to Stand: Min guard         General transfer comment: Min guard for safety to maintain balance. Required cues for R heart cath precautions.   Ambulation/Gait Ambulation/Gait assistance: Min guard;Min assist Gait Distance (Feet): 150 Feet(X2) Assistive device: None Gait Pattern/deviations: Step-through pattern;Decreased stride length;Decreased dorsiflexion - right;Drifts  right/left;Staggering right;Staggering left Gait velocity: Decreased   General Gait Details: Slow, unsteady gait, especially with dynamic gait tasks and with dual tasking. Required multiple cues for appropriate step height as pt with shuffeled gait. Pt only able to recall 1/3 words from word recall task by end of gait training.    Stairs             Wheelchair Mobility    Modified Rankin (Stroke Patients Only)       Balance Overall balance assessment: Needs assistance Sitting-balance support: No upper extremity supported;Feet supported Sitting balance-Leahy Scale: Good     Standing balance support: No upper extremity supported;During functional activity Standing balance-Leahy Scale: Fair   Single Leg Stance - Right Leg: 6 Single Leg Stance - Left Leg: 1             Standardized Balance Assessment Standardized Balance Assessment : Dynamic Gait Index   Dynamic Gait Index Level Surface: Mild Impairment Gait with Horizontal Head Turns: Mild Impairment Step Over Obstacle: Mild Impairment Step Around Obstacles: Moderate Impairment      Cognition Arousal/Alertness: Awake/alert Behavior During Therapy: WFL for tasks assessed/performed Overall Cognitive Status: Impaired/Different from baseline Area of Impairment: Attention;Memory;Following commands;Safety/judgement;Awareness;Problem solving                   Current Attention Level: Selective Memory: Decreased short-term memory Following Commands: Follows one step commands consistently Safety/Judgement: Decreased awareness of deficits;Decreased awareness of safety Awareness: Emergent Problem Solving: Slow processing;Requires verbal cues General Comments: Pt easily distracted throughout mobility. Only able to recall 1/3 words when performing 3 word recall task. Kept repeating "my legs are just weak" in reference to mobility deficits.       Exercises  General Comments General comments (skin  integrity, edema, etc.): VSS throughout       Pertinent Vitals/Pain Pain Assessment: No/denies pain    Home Living     Available Help at Discharge: Family;Available 24 hours/day Type of Home: House              Prior Function            PT Goals (current goals can now be found in the care plan section) Acute Rehab PT Goals Patient Stated Goal: to get stronger PT Goal Formulation: With patient/family Time For Goal Achievement: 04/12/18 Potential to Achieve Goals: Good Progress towards PT goals: Progressing toward goals    Frequency    Min 3X/week      PT Plan Current plan remains appropriate    Co-evaluation              AM-PAC PT "6 Clicks" Mobility   Outcome Measure  Help needed turning from your back to your side while in a flat bed without using bedrails?: A Little Help needed moving from lying on your back to sitting on the side of a flat bed without using bedrails?: A Little Help needed moving to and from a bed to a chair (including a wheelchair)?: A Little Help needed standing up from a chair using your arms (e.g., wheelchair or bedside chair)?: A Little Help needed to walk in hospital room?: A Little Help needed climbing 3-5 steps with a railing? : A Lot 6 Click Score: 17    End of Session Equipment Utilized During Treatment: Gait belt Activity Tolerance: Patient tolerated treatment well Patient left: in chair;with call bell/phone within reach;with chair alarm set Nurse Communication: Mobility status PT Visit Diagnosis: Unsteadiness on feet (R26.81);Difficulty in walking, not elsewhere classified (R26.2);Muscle weakness (generalized) (M62.81)     Time: 8675-4492 PT Time Calculation (min) (ACUTE ONLY): 20 min  Charges:  $Gait Training: 8-22 mins                     Gladys Damme, PT, DPT  Acute Rehabilitation Services  Pager: [phone] Office: [phone]    Lehman Prom 04/02/2018, 4:21 PM

## 2018-04-03 LAB — BASIC METABOLIC PANEL
Anion gap: 6 (ref 5–15)
BUN: 14 mg/dL (ref 8–23)
CO2: 23 mmol/L (ref 22–32)
Calcium: 8.6 mg/dL — ABNORMAL LOW (ref 8.9–10.3)
Chloride: 109 mmol/L (ref 98–111)
Creatinine, Ser: 1.28 mg/dL — ABNORMAL HIGH (ref 0.61–1.24)
GFR calc Af Amer: >60 mL/min (ref >60)
GFR calc non Af Amer: 60 mL/min — ABNORMAL LOW (ref >60)
Glucose, Bld: 88 mg/dL (ref 70–99)
POTASSIUM: 3.7 mmol/L (ref 3.5–5.1)
Sodium: 138 mmol/L (ref 135–145)

## 2018-04-03 LAB — CBC
HCT: 38.2 % — ABNORMAL LOW (ref 39.0–52.0)
Hemoglobin: 12.4 g/dL — ABNORMAL LOW (ref 13.0–17.0)
MCH: 27.6 pg (ref 26.0–34.0)
MCHC: 32.5 g/dL (ref 30.0–36.0)
MCV: 84.9 fL (ref 80.0–100.0)
Platelets: 264 10*3/uL (ref 150–400)
RBC: 4.5 MIL/uL (ref 4.22–5.81)
RDW: 14.1 % (ref 11.5–15.5)
WBC: 8.8 10*3/uL (ref 4.0–10.5)
nRBC: 0 % (ref 0.0–0.2)

## 2018-04-03 NOTE — Progress Notes (Addendum)
EP service called to make aware of patient.  S/p C arrest, investigation onto this noted no significant CAD, noted to have bicuspid AV, dilated aortic root/ascending AO and severe AI  CTS consult is noted, planned for AVR and probable aortic root and/or ascending ao replacement prior to discharge.  EP will follow from afar for now, plan for formal consultation/ICD once closer to discharge.  If the patient is planned for discharge to rehab 1st prior to AVR, would consider Life vest. I will discuss with Dr. Ladona Ridgel.  Francis Dowse, PA-C  EP Attending  Agree with above. See my consult note as well. He will need ICD insertion after AVR.   Leonia Reeves.D.

## 2018-04-03 NOTE — Progress Notes (Signed)
CARDIAC REHAB PHASE I   PRE:  Rate/Rhythm: 85 SR    BP: sitting 131/89    SaO2: 100 RA  MODE:  Ambulation: 240 ft   POST:  Rate/Rhythm: 120 ST    BP: sitting 137/88     SaO2: 97 RA  Pt up in recliner. Oriented, able to tell me what MDs are saying regarding his plan. Pt requested using RW for support. Slowly stood independently, legs are weak.  Steady with RW and gait belt although pt shuffled feet for walk, did not pick them up. No c/o except fatigue with distance, HR up to 120 ST. Return to recliner, put on chair alarm. Encouraged more walking, will f/u. [id number]  Harriet Masson CES, ACSM 04/03/2018 11:31 AM

## 2018-04-03 NOTE — PMR Pre-admission (Addendum)
PMR Admission Coordinator Pre-Admission Assessment  Patient: Kevin Rojas is an 63 y.o., male MRN: [id number] DOB: 1955/11/02 Height: 5\' 10"  (177.8 cm) Weight: 64.9 kg              Insurance Information HMO:     PPO: yes     PCP:      IPA:      80/20:      OTHER:  PRIMARY: [employer] [place]      Policy#: [id number]      Subscriber: pt CM Name: Carollee Herter      Phone#: [phone]     Fax#: [phone] Pre-Cert#: [id number]   Approved until 4/10 when updates are due   Employer:  Benefits:  Phone #: [phone]     Name: 04/03/2018 Eff. Date: 01/07/2018     Deduct: $2500      Out of Pocket Max: $7500 includes deductible      Life Max: none CIR: 80%      SNF: 80% 120 per year Outpatient: $25 to $40 per visit     Co-Pay: visits per medical neccesity Home Health: 80%      Co-Pay: 100 Visits per year DME: 80%     Co-Pay: 20% Providers: in network  SECONDARY: none     Medicaid Application Date:       Case Manager:  Disability Application Date:       Case Worker:   Emergency Conservator, museum/gallery Information    Name Relation Home Work Mobile   Blazier,Jenene Spouse   [phone]   Kerrick, [place]   [phone]     Current Medical History  Patient Admitting Diagnosis: cardiac arrest, debility  History of Present Illness: Kevin Rojas is a 63 y.o. male with history of LBBB, renal calculi, recent URI symptoms who was admitted from home on 03/24/2018 with V. fib cardiac arrest.  He was treated with CPR x3 minutes by family as well as defibrillation by EMS. He was intubated and treated with cooling protocol. He was started on Unasyn for aspiration PNA. 2D echo showed EF 45-50% with severe AI as well as concerns of type A aortic dissection.  CTA chest negative for PE on 3/17, dissection or fluid overload.  EKG without acute changes but cardiac enzymes positive due to NSTEMI felt to be due to demand ischemia.  CT head negative for acute changes and EEG showed generalized slowing. He tolerated  extubation on 03/28/18 and developed ileus with bilious emesis therefore NGT placed.  Ileus resolved and NGT removed with patient tolerating regular diet.  Cardiac cath performed 3/26 and Cardiothoracic surgery and EP following. Severe aortic regurgitation and mild aortic stenosis of bicuspid valve.  Plan for AVR and ICD after inpatient rehab for debility and preparation prior to surgery. Plan for ICD placement post AVR and for use of Life vest in between surgeries and for CIR .  On 3/28 CTA was complete as work up for AVR. Incidental finding of PE. Radiologist felt low burden clot, no present on CTA chest on 3/17. Lovenox initiated. Patient with no hypoxemia, LE edema, pleuritic chest pain or dyspnea.    Past Medical History  History reviewed. No pertinent past medical history.  Family History  family history includes Prostate cancer in his father; Pulmonary embolism in his mother.  Prior Rehab/Hospitalizations:  Has the patient had prior rehab or hospitalizations prior to admission? Yes  Has the patient had major surgery during 100 days prior to admission? No  Current Medications   Current  Facility-Administered Medications:  .  0.9 %  sodium chloride infusion, 250 mL, Intravenous, PRN, Lennette Bihari, MD, Stopped at 04/02/18 1800 .  acetaminophen (TYLENOL) tablet 650 mg, 650 mg, Oral, Q4H PRN, Lennette Bihari, MD .  albuterol (PROVENTIL) (2.5 MG/3ML) 0.083% nebulizer solution 2.5 mg, 2.5 mg, Nebulization, Q2H PRN, Lonia Blood, MD .  chlorhexidine (PERIDEX) 0.12 % solution 15 mL, 15 mL, Mouth Rinse, BID, Luciano Cutter, MD, 15 mL at 04/03/18 0844 .  diazepam (VALIUM) tablet 5 mg, 5 mg, Oral, Q6H PRN, Lennette Bihari, MD .  feeding supplement (ENSURE ENLIVE) (ENSURE ENLIVE) liquid 237 mL, 237 mL, Oral, BID BM, Lonia Blood, MD, 237 mL at 04/03/18 0844 .  heparin injection 5,000 Units, 5,000 Units, Subcutaneous, Q8H, Lennette Bihari, MD, 5,000 Units at 04/03/18 [phone] .   hydrALAZINE (APRESOLINE) injection 10-40 mg, 10-40 mg, Intravenous, Q4H PRN, Oretha Milch, MD, 20 mg at 03/28/18 4656 .  MEDLINE mouth rinse, 15 mL, Mouth Rinse, q12n4p, Luciano Cutter, MD, 15 mL at 04/03/18 1518 .  ondansetron (ZOFRAN) injection 4 mg, 4 mg, Intravenous, Q6H PRN, Lennette Bihari, MD .  pantoprazole (PROTONIX) EC tablet 40 mg, 40 mg, Oral, Q1200, Lonia Blood, MD, 40 mg at 04/03/18 1246 .  sodium chloride flush (NS) 0.9 % injection 3 mL, 3 mL, Intravenous, Q12H, Lennette Bihari, MD, 3 mL at 04/03/18 0844 .  sodium chloride flush (NS) 0.9 % injection 3 mL, 3 mL, Intravenous, PRN, Lennette Bihari, MD  Patients Current Diet:  Diet Order            Diet Heart Room service appropriate? Yes; Fluid consistency: Thin  Diet effective now              Precautions / Restrictions Precautions Precautions: Fall, Other (comment) Precaution Comments: s/p r heart cath Restrictions Weight Bearing Restrictions: No   Has the patient had 2 or more falls or a fall with injury in the past year?No  Prior Activity Level Community (5-7x/wk): Independent, active; driving, cement truck; also is a Education officer, environmental  Prior Functional Level Prior Function Level of Independence: Independent Comments: drives, concrete truck driver  Self Care: Did the patient need help bathing, dressing, using the toilet or eating?  Independent  Indoor Mobility: Did the patient need assistance with walking from room to room (with or without device)? Independent  Stairs: Did the patient need assistance with internal or external stairs (with or without device)? Independent  Functional Cognition: Did the patient need help planning regular tasks such as shopping or remembering to take medications? Independent  Home Assistive Devices / Equipment Home Assistive Devices/Equipment: Grab bars in shower Home Equipment: None  Prior Device Use: Indicate devices/aids used by the patient prior to current illness,  exacerbation or injury? None of the above  Current Functional Level Cognition  Arousal/Alertness: Awake/alert Overall Cognitive Status: Impaired/Different from baseline Current Attention Level: Selective Orientation Level: Oriented to person, Oriented to place, Disoriented to situation Following Commands: Follows one step commands consistently Safety/Judgement: Decreased awareness of deficits, Decreased awareness of safety General Comments: pt provided a pill box test and noted to have errors without awarness. pt was able to complete 1 pill a day instructions but when asked to complete multiple steps ( 3 pills per day at meals.) Pt could not figure out when meals occur. pt talking the whole task out loud to therapist and only given cues to finish the task and i will answer your  questionsa t the end. Pt is a agreeable to home health RN to help with medication after this task. pt normally helps set up wife pill box.  Attention: Selective Selective Attention: Impaired Selective Attention Impairment: Verbal complex, Functional complex Memory: Impaired Memory Impairment: Decreased recall of new information, Decreased short term memory(immediate recall 4/5, delayed recall 1/5) Decreased Short Term Memory: Verbal basic, Functional basic Awareness: Impaired Awareness Impairment: Intellectual impairment(somewhat aware of memory deficit) Problem Solving: Impaired Problem Solving Impairment: Verbal basic(slow processing, extended time, 2/3) Executive Function: Organizing, Sequencing Sequencing: Impaired Sequencing Impairment: Music therapist: Impaired Organizing Impairment: Functional basic Safety/Judgment: Impaired Comments: decreased awareness    Extremity Assessment (includes Sensation/Coordination)  Upper Extremity Assessment: Generalized weakness  Lower Extremity Assessment: Generalized weakness    ADLs  Overall ADL's : Needs assistance/impaired Eating/Feeding:  Independent Grooming: Wash/dry hands, Wash/dry face, Standing, Supervision/safety Upper Body Bathing: Sitting, Maximal assistance Lower Body Bathing: Maximal assistance, Sit to/from stand, +2 for physical assistance Upper Body Dressing : Moderate assistance, Sitting Lower Body Dressing: Supervision/safety, Sit to/from stand Lower Body Dressing Details (indicate cue type and reason): pajama pants and socks Toilet Transfer: Ambulation, RW, Min guard Toilet Transfer Details (indicate cue type and reason): Min A for balance.  Toileting- Clothing Manipulation and Hygiene: Minimal assistance, Sit to/from stand Functional mobility during ADLs: Rolling walker, Min guard General ADL Comments: session focused on cognition with pill box test. pt required 17 minutes to complete task. pt unable to recall address with BLESS test but answering all other questions correct.     Mobility  Overal bed mobility: Modified Independent Bed Mobility: Supine to Sit Supine to sit: Min assist Sit to supine: Mod assist General bed mobility comments: in chai ron arrrival    Transfers  Overall transfer level: Needs assistance Equipment used: None Transfers: Sit to/from Stand Sit to Stand: Min guard Stand pivot transfers: Min assist General transfer comment: seated on arrival and seated task    Ambulation / Gait / Stairs / Wheelchair Mobility  Ambulation/Gait Ambulation/Gait assistance: Min guard, Min assist Gait Distance (Feet): 150 Feet(X2) Assistive device: None Gait Pattern/deviations: Step-through pattern, Decreased stride length, Decreased dorsiflexion - right, Drifts right/left, Staggering right, Staggering left General Gait Details: Slow, unsteady gait, especially with dynamic gait tasks and with dual tasking. Required multiple cues for appropriate step height as pt with shuffeled gait. Pt only able to recall 1/3 words from word recall task by end of gait training.  Gait velocity: Decreased Gait velocity  interpretation: <1.8 ft/sec, indicate of risk for recurrent falls    Posture / Balance Dynamic Sitting Balance Sitting balance - Comments: no LOB with donning pants and socks Static Standing Balance Single Leg Stance - Right Leg: 6 Single Leg Stance - Left Leg: 1 Rhomberg - Eyes Opened: 10 Balance Overall balance assessment: Needs assistance Sitting-balance support: No upper extremity supported, Feet supported Sitting balance-Leahy Scale: Good Sitting balance - Comments: no LOB with donning pants and socks Standing balance support: No upper extremity supported, During functional activity Standing balance-Leahy Scale: Fair Standing balance comment: can release walker in static standing at sink Single Leg Stance - Right Leg: 6 Single Leg Stance - Left Leg: 1 Rhomberg - Eyes Opened: 10 Standardized Balance Assessment Standardized Balance Assessment : Dynamic Gait Index Dynamic Gait Index Level Surface: Mild Impairment Change in Gait Speed: Moderate Impairment Gait with Horizontal Head Turns: Mild Impairment Gait with Vertical Head Turns: Moderate Impairment Gait and Pivot Turn: Mild Impairment Step Over Obstacle: Mild Impairment Step Around  Obstacles: Moderate Impairment Steps: Moderate Impairment Total Score: 13 Timed Up and Go Test TUG: Manual TUG Manual TUG (seconds): 11.9    Special needs/care consideration BiPAP/CPAP n/a CPM n/a Continuous Drip IV n/a Dialysis n/a Life Vest to be obtained 3/27 prior to d/c to CIR Oxygen n/a Special Bed n/a Trach Size n/a Wound Vac n/a Skin n/a Bowel mgmt: continent Bladder mgmt: continent Diabetic mgmt n/a Behavioral consideration n/a Chemo/radiation  N/a     Previous Home Environment (from acute therapy documentation) Living Arrangements: Spouse/significant other  Lives With: Spouse Available Help at Discharge: Family, Available 24 hours/day Type of Home: House Home Layout: One level Home Access: Stairs to enter Entrance  Stairs-Rails: Right Entrance Stairs-Number of Steps: 5+2 Bathroom Shower/Tub: Engineer, manufacturing systems: Standard Home Care Services: No Additional Comments: has access to a walker  Discharge Living Setting Plans for Discharge Living Setting: Patient's home, Lives with (comment)(spouse) Type of Home at Discharge: House Discharge Home Layout: One level Discharge Home Access: Stairs to enter Entrance Stairs-Rails: Right Entrance Stairs-Number of Steps: 5 +2 Discharge Bathroom Shower/Tub: Tub/shower unit, Curtain Discharge Bathroom Toilet: Standard Discharge Bathroom Accessibility: Yes How Accessible: Accessible via walker Does the patient have any problems obtaining your medications?: No  Social/Family/Support Systems Patient Roles: Spouse, Parent(employee and pastor) Contact Information: Trygve, son is main conatct and pt's wife second Anticipated Caregiver: wife and son Anticipated Caregiver's Contact Information: Najae [phone] Ability/Limitations of Caregiver: wife 24/7; Xander is a Licensed conveyancer Caregiver Availability: 24/7 Discharge Plan Discussed with Primary Caregiver: Yes Is Caregiver In Agreement with Plan?: Yes Does Caregiver/Family have Issues with Lodging/Transportation while Pt is in Rehab?: No Goals/Additional Needs Patient/Family Goal for Rehab: Mod I to supervision PT, OT, and SLP Expected length of stay: [facility] 7 to 10 days then to OR for Valve surgery Pt/Family Agrees to Admission and willing to participate: Yes Program Orientation Provided & Reviewed with Pt/Caregiver Including Roles  & Responsibilities: Yes  Decrease burden of Care through IP rehab admission: n/a  Possible need for SNF placement upon discharge: not anticipated  Patient Condition: This patient's medical and functional status has changed since the consult dated: 03/30/2018 in which the Rehabilitation Physician determined and documented that the patient's condition is  appropriate for intensive rehabilitative care in an inpatient rehabilitation facility. See "History of Present Illness" (above) for medical update. Functional changes are: overall min assist. Patient's medical and functional status update has been discussed with the Rehabilitation physician and patient remains appropriate for inpatient rehabilitation. Will admit to inpatient rehab 04/05/2018.  Preadmission Screen Completed By:  Clois Dupes, RN, MSN 04/03/2018 4:33 PM ______________________________________________________________________   Discussed status with Dr. Wynn Banker on 04/03/2018 at  1600 and received approval for 04/05/18  Admission Coordinator:  Clois Dupes, time 1600 Date 04/04/2018

## 2018-04-03 NOTE — Consult Note (Addendum)
Cardiology Consultation:   Patient ID: Kevin Rojas MRN: [id number]; DOB: 1955/08/06  Admit date: 03/24/2018 Date of Consult: 04/03/2018  Primary Care Provider: [name], MD Primary Cardiologist: Tonny Bollman, MD  Primary Electrophysiologist:  New, Dr. Ladona Ridgel   Patient Profile:   Kevin Rojas is a 63 y.o. male with PMHx of LBBB, renal calculi who is being seen today for the evaluation of ICD at the request of Dr. Katrinka Blazing.  History of Present Illness:   Mr. Pile suffered an V. fib cardiac arrest at home.  He was treated with CPR x3 minutes by family as well as defibrillation by EMS. He was intubated and treated with cooling protocol. He has subsequently been extubated, being treated for likely aspiration pneumonia, w/u has notedno CAD, a bicuspid AV with dilated ascending AO/root, and severe AI.  EP is asked to weigh in on ICD implant/timing.  CTA has seen the patient, recommends AVR with probable aortic root and/or ascending ao replacement, though is planned first to go to CIR to rehab and strengthen prior to surgery.  History reviewed. No pertinent past medical history.  Past Surgical History:  Procedure Laterality Date  . AORTIC ARCH ANGIOGRAPHY N/A 04/02/2018   Procedure: AORTIC ARCH ANGIOGRAPHY;  Surgeon: Lennette Bihari, MD;  Location: [facility] INVASIVE CV LAB;  Service: Cardiovascular;  Laterality: N/A;  . RIGHT/LEFT HEART CATH AND CORONARY ANGIOGRAPHY N/A 04/02/2018   Procedure: RIGHT/LEFT HEART CATH AND CORONARY ANGIOGRAPHY;  Surgeon: Lennette Bihari, MD;  Location: [facility] INVASIVE CV LAB;  Service: Cardiovascular;  Laterality: N/A;     Home Medications:  Prior to Admission medications   Medication Sig Start Date End Date Taking? Authorizing Provider  acetaminophen (TYLENOL) 500 MG tablet Take 1,000 mg by mouth every 6 (six) hours as needed for mild pain.   Yes [provider]  HYDROcodone-acetaminophen (NORCO/VICODIN) 5-325 MG tablet Take 1 tablet by mouth every  6 (six) hours as needed for severe pain. Patient not taking: Reported on 03/25/2018 12/31/17   Zadie Rhine, MD  ondansetron (ZOFRAN ODT) 8 MG disintegrating tablet  ODT q4 hours prn nausea Patient not taking: Reported on 03/25/2018 12/31/17   Zadie Rhine, MD    Inpatient Medications: Scheduled Meds: . chlorhexidine  15 mL Mouth Rinse BID  . feeding supplement (ENSURE ENLIVE)  237 mL Oral BID BM  . heparin  5,000 Units Subcutaneous Q8H  . mouth rinse  15 mL Mouth Rinse q12n4p  . pantoprazole  40 mg Oral Q1200  . sodium chloride flush  3 mL Intravenous Q12H   Continuous Infusions: . sodium chloride Stopped (04/02/18 1800)   PRN Meds: sodium chloride, acetaminophen, albuterol, diazepam, hydrALAZINE, ondansetron (ZOFRAN) IV, sodium chloride flush  Allergies:   No Known Allergies  Social History:   Social History   Socioeconomic History  . Marital status: Married    Spouse name: Not on file  . Number of children: Not on file  . Years of education: Not on file  . Highest education level: Not on file  Occupational History  . Not on file  Social Needs  . Financial resource strain: Not on file  . Food insecurity:    Worry: Not on file    Inability: Not on file  . Transportation needs:    Medical: Not on file    Non-medical: Not on file  Tobacco Use  . Smoking status: Never Smoker  . Smokeless tobacco: Never Used  Substance and Sexual Activity  . Alcohol use: Never    Frequency:  Never  . Drug use: Never  . Sexual activity: Not on file  Lifestyle  . Physical activity:    Days per week: Not on file    Minutes per session: Not on file  . Stress: Not on file  Relationships  . Social connections:    Talks on phone: Not on file    Gets together: Not on file    Attends religious service: Not on file    Active member of club or organization: Not on file    Attends meetings of clubs or organizations: Not on file    Relationship status: Not on file  . Intimate  partner violence:    Fear of current or ex partner: Not on file    Emotionally abused: Not on file    Physically abused: Not on file    Forced sexual activity: Not on file  Other Topics Concern  . Not on file  Social History Narrative  . Not on file    Family History:   Family History  Problem Relation Age of Onset  . Pulmonary embolism Mother   . Prostate cancer Father        passed awary at 60     ROS:  Please see the history of present illness.  All other ROS reviewed and negative.     Physical Exam/Data:   Vitals:   04/02/18 1321 04/02/18 1829 04/02/18 2149 04/03/18 0629  BP: 132/83 (!) 150/95 (!) 151/87 (!) 142/79  Pulse: 74 69 87 89  Resp: Temp:   97.8 F (36.6 C) 100.1 F (37.8 C)  TempSrc:   Oral Oral  SpO2: 98% 98% 100% 98%  Weight:    64.9 kg  Height:        Intake/Output Summary (Last 24 hours) at 04/03/2018 1016 Last data filed at 04/03/2018 0634 Gross per 24 hour  Intake 1327.7 ml  Output 975 ml  Net 352.7 ml   Last 3 Weights 04/03/2018 04/02/2018 04/01/2018  Weight (lbs) 143 lb 1.6 oz 141 lb 8 oz 142 lb 1.6 oz  Weight (kg) 64.91 kg 64.184 kg 64.456 kg     Body mass index is 20.53 kg/m.  General:  Well nourished, well developed, in no acute distress HEENT: normal Lymph: no adenopathy Neck: no JVD Endocrine:  No thryomegaly Vascular: No carotid bruits  Cardiac:  RRR; +DM, no rubs or gallops Lungs:  CAT b/l, no wheezing, rhonchi or rales  Abd: soft, nontender Ext: no edema Musculoskeletal:  No deformities Skin: warm and dry  Neuro:  no focal abnormalities noted Psych:  Normal affect   EKG:  The EKG was personally reviewed and demonstrates:   #1 SR, LBBB, LAD 04/01/2018: SR, LBBB, LAD Telemetry:  Telemetry was personally reviewed and demonstrates:   SR  Relevant CV Studies:   04/02/2018: LHC  There is mild left ventricular systolic dysfunction.  LV end diastolic pressure is normal.  The left ventricular ejection fraction  is 45-50% by visual estimate.  There is severe (4+) aortic regurgitation.   Normal right heart pressures. Mild global LV dysfunction with an EF 45 to 50%. Bicuspid aortic valve with severe 4+ aortic insufficiency, mild aortic stenosis. Dilated aortic root. Normal coronary arteries and a left dominant circulation.  RECOMMENDATION: The patient will ultimately need surgical consultation for aortic valve repair/replacement.   3/182020: TTE IMPRESSIONS  1. The left ventricle has mildly reduced systolic function, with an ejection fraction of 45-50%. The cavity size was mildly dilated.  Left ventricular diastolic Doppler parameters are consistent with pseudonormalization.  2. The right ventricle has normal systolic function. The cavity was normal. There is no increase in right ventricular wall thickness.  3. Right atrial size was mildly dilated.  4. Trivial pericardial effusion is present.  5. Mild thickening of the mitral valve leaflet.  6. The aortic valveis bicuspid Aortic valve regurgitation is severe by color flow Doppler.  7. The aortic valve appears to be a bicuspid or functionally bicuspid AV. There is severe AI.     The aortic root is mildly dilated.  8. There is mild dilatation of the ascending aorta.  9.     The aortic valve appears to be a bicuspid or functionally bicuspid AV. There is severe AI.     The aortic root is mildly dilated .     There is concern for a Type A aortic dissection.     Suggest STAT CT angiogram of the chest for further evaluation.           10. The inferior vena cava was normal in size with <50% respiratory variability. 11. The interatrial septum was not assessed.  FINDINGS  Left Ventricle: The left ventricle has mildly reduced systolic function, with an ejection fraction of 45-50%. The cavity size was mildly dilated. There is no increase in left ventricular wall thickness. Left ventricular diastolic Doppler parameters are  consistent with  pseudonormalization. Right Ventricle: The right ventricle has normal systolic function. The cavity was normal. There is no increase in right ventricular wall thickness. Left Atrium: left atrial size was normal in size Right Atrium: right atrial size was mildly dilated. Right atrial pressure is estimated at 8 mmHg. Interatrial Septum: The interatrial septum was not assessed. Pericardium: Trivial pericardial effusion is present. Mitral Valve: The mitral valve is normal in structure. Mild thickening of the mitral valve leaflet. Mitral valve regurgitation is trivial by color flow Doppler. Tricuspid Valve: The tricuspid valve is normal in structure. Tricuspid valve regurgitation is mild by color flow Doppler. Aortic Valve: The aortic valveis bicuspid Aortic valve regurgitation is severe by color flow Doppler. The aortic valve appears to be a bicuspid or functionally bicuspid AV. There is severe AI. The aortic root is mildly dilated.   Pulmonic Valve: The pulmonic valve was normal in structure. Pulmonic valve regurgitation is not visualized by color flow Doppler. Aorta: There is mild dilatation of the ascending aorta. The aortic valve appears to be a bicuspid or functionally bicuspid AV. There is severe AI. The aortic root is mildly dilated . There is concern for a Type A aortic dissection. Suggest STAT CT angiogram of the chest for further evaluation.   Laboratory Data:  Chemistry Recent Labs  Lab 04/01/18 0305 04/02/18 0243  04/02/18 0900 04/02/18 0930 04/02/18 1048 04/03/18 0248  NA 140 140   < > 142 140  --  138  K 3.8 3.8   < > 3.6 3.6  --  3.7  CL 106 107  --   --   --   --  109  CO2 24 23  --   --   --   --  23  GLUCOSE 85 91  --   --   --   --  88  BUN 20 19  --   --   --   --  14  CREATININE 1.40* 1.36*  --   --   --  1.33* 1.28*  CALCIUM 8.8* 8.7*  --   --   --   --  8.6*  GFRNONAA 53* 55*  --   --   --  57* 60*  GFRAA >60 >60  --   --   --  >60 >60  ANIONGAP 10 10  --    --   --   --  6   < > = values in this interval not displayed.    Recent Labs  Lab 03/30/18 0344 03/31/18 0302  PROT  --  7.2  ALBUMIN 3.0* 2.8*  AST  --  23  ALT  --  40  ALKPHOS  --  70  BILITOT  --  1.4*   Hematology Recent Labs  Lab 04/02/18 0243  04/02/18 0930 04/02/18 1048 04/03/18 0248  WBC 10.4  --   --  9.9 8.8  RBC 4.61  --   --  4.74 4.50  HGB 12.3*   < > 11.2* 12.8* 12.4*  HCT 38.9*   < > 33.0* 40.3 38.2*  MCV 84.4  --   --  85.0 84.9  MCH 26.7  --   --  27.0 27.6  MCHC 31.6  --   --  31.8 32.5  RDW 13.8  --   --  13.9 14.1  PLT 251  --   --  276 264   < > = values in this interval not displayed.   Cardiac EnzymesNo results for input(s): TROPONINI in the last 168 hours. No results for input(s): TROPIPOC in the last 168 hours.  BNPNo results for input(s): BNP, PROBNP in the last 168 hours.  DDimer No results for input(s): DDIMER in the last 168 hours.  Radiology/Studies:   Vas US Doppler Pre Cabg Result Date: 04/02/2018 PREOPERATIVE VASCULAR EVALUATION  Indications: Pre cabg. Performing Technologist: Olen Cordial Rvt  Examination Guidelines: A complete evaluation includes B-mode imaging, spectral Doppler, color Doppler, and power Doppler as needed of all accessible portions of each vessel. Bilateral testing is considered an integral part of a complete examination. Limited examinations for reoccurring indications may be performed as noted  Summary: Right Carotid: Velocities in the right ICA are consistent with a 1-39% stenosis. Left Carotid: Velocities in the left ICA are consistent with a 1-39% stenosis. Vertebrals: Bilateral vertebral arteries demonstrate antegrade flow. Right ABI: Resting right ankle-brachial index indicates noncompressible right lower extremity arteries. Left ABI: Resting left ankle-brachial index indicates noncompressible left lower extremity arteries.  Electronically signed by Sherald Hess MD on 04/02/2018 at 5:46:33 PM.    Final      Assessment and Plan:   1. S/p cardiac arrest     He has indication for secondary prevention ICD implant     Timing would be post AVR  He is planned to go to rehab, come back for surgery. Recommend life vest in the meantime.    Likely would implant post AVR by then likely out patient (with life vest in-between again), vs prior to discharge that hospitalization  We will not pursue implant at this time  Dr. Ladona Ridgel has seen and examined the patient I have will arrange out patient follow up with Dr. Ladona Ridgel for now  Life vest must be fitted and on patient prior to discharge I have reached out to [employer] rep, he is aware of pending discharge Order is written and given to case management    For questions or updates, please contact [facility] Please consult [email or web address] for contact info under    Signed, Sheilah Pigeon, PA-C  04/03/2018 10:16 AM;  EP Attending  Patient seen and examined.  Agree with the findings as noted above. The patient has a VF arrest, severe AI and mild LV dysfunction. He is pending AVR. He is to go to CIR. He will need a Life Vest, rehab, AVR, ICD insertion for secondary prevention.   Leonia Reeves.D.

## 2018-04-03 NOTE — TOC Initial Note (Addendum)
Transition of Care [facility]) - Initial/Assessment Note    Patient Details  Name: Kevin Rojas MRN: [id number] Date of Birth: [birth year]  Transition of [facility] CM/SW Contact:    Gala Lewandowsky, RN Phone Number: 04/03/2018, 11:06 AM  Clinical Narrative:   Pt presented for cardiac arrest-PTA from home with support of spouse. Plan for CIR if insurance approves. CIR Admissions Coordinator assisting with this. CM did receive notification for Life Vest- CM faxed information to [employer]- awaiting approval at this time. CM will continue to monitor for additional transition of care needs.        1458 04-03-18 Patient was approved via Insurance for [employer] vest. Patient will be fit around 5:00 pm. Staff RN aware- CIR Admissions Coordinator aware and patient will transition to CIR on 04-04-18. No further needs from CM at this time.   Expected Discharge Plan: IP Rehab Facility Barriers to Discharge: No Barriers Identified   Patient Goals and CMS Choice   CMS Medicare.gov Compare Post Acute Care list provided to:: (CIR following- Insurance pending. ) Choice offered to / list presented to : NA  Expected Discharge Plan and Services Expected Discharge Plan: IP Rehab Facility   Discharge Planning Services: CM Consult Post Acute Care Choice: NA Living arrangements for the past 2 months: Single Family Home                 DME Arranged: Life vest DME Agency: [employer] HH Arranged: NA HH Agency: NA  Prior Living Arrangements/Services Living arrangements for the past 2 months: Single Family Home   Patient language and need for interpreter reviewed:: No Do you feel safe going back to the place where you live?: Yes               Activities of Daily Living Home Assistive Devices/Equipment: Grab bars in shower ADL Screening (condition at time of admission) Patient's cognitive ability adequate to safely complete daily activities?: Yes Is the patient deaf or have difficulty hearing?:  No Does the patient have difficulty seeing, even when wearing glasses/contacts?: No Does the patient have difficulty concentrating, remembering, or making decisions?: No Patient able to express need for assistance with ADLs?: Yes Does the patient have difficulty dressing or bathing?: Yes Independently performs ADLs?: No Communication: Independent Does the patient have difficulty walking or climbing stairs?: Yes Weakness of Legs: Both Weakness of Arms/Hands: Both  Permission Sought/Granted Permission sought to share information with : Case Manager                Emotional Assessment     Affect (typically observed): Accepting Orientation: : Oriented to Self, Oriented to Place, Oriented to  Time, Oriented to Situation Alcohol / Substance Use: Not Applicable Psych Involvement: No (comment)  Admission diagnosis:  Cardiac arrest (HCC) [I46.9] Encounter for central line placement [Z45.2] Patient Active Problem List   Diagnosis Date Noted  . Acute respiratory failure with hypoxia (HCC)   . Aspiration into airway   . Severe aortic insufficiency 03/26/2018  . Cardiac arrest (HCC) 03/24/2018  . LBBB (left bundle branch block) 03/24/2018  . Elevated troponin 03/24/2018  . Hypokalemia 03/24/2018  . Endotracheally intubated 03/24/2018  . Abnormal CXR 03/24/2018  . Aspiration pneumonia (HCC) 03/24/2018   PCP:  [name], MD Pharmacy:   [id number] - [place], [place] - [address] AT [facility] OF [address] [place]  [address] Phone: [phone] Fax: [phone]     Social Determinants  of Health (SDOH) Interventions    Readmission Risk Interventions No flowsheet data found.  

## 2018-04-03 NOTE — Progress Notes (Signed)
  Speech Language Pathology Treatment: Dysphagia;Cognitive-Linquistic  Patient Details Name: Kevin Rojas MRN: [id number] DOB: 1955/11/12 Today's Date: 04/03/2018 Time: 4098-1191 SLP Time Calculation (min) (ACUTE ONLY): 16 min  Assessment / Plan / Recommendation Clinical Impression  Pt consumed regular solids and thin liquid with a single throat clear x1 while pt was talking and eating simultaneously. Otherwise he appears to be swallowing appropriately, and his diet had already been advanced from mechanical soft. Suspect he might actually do better with intermittent supervision so that he is not distracted by others in the room. SLP to sign off for swallowing at this time.   The remainder of the treatment session was focused on cognitive skills. Pt needed Mod-Max cues for selective attention and problem solving to utilize his menu and identify heart healthy menu items. He demonstrated intellectual awareness of one physical and one cognitive deficit. He said that he needs to work on his memory, because he "can't remember [his] own phone number." Continue to recommend SLP f/u at CIR to address cognitive goals.   HPI HPI: 63 year old man admitted 3/17 with V. fib arrest from home.  He has a history of chronic left bundle branch block. Underwent hypothermia protocol; vomited around [facility] airway. Intubated in ER 3/17-3/21.  NG placed 3/21 for stomach decompression due to ongoing vomiting.        SLP Plan  Continue with current plan of care       Recommendations  Diet recommendations: Regular;Thin liquid Liquids provided via: Cup;Straw Medication Administration: Whole meds with liquid Supervision: Patient able to self feed;Intermittent supervision to cue for compensatory strategies Compensations: Slow rate;Small sips/bites;Minimize environmental distractions Postural Changes and/or Swallow Maneuvers: Seated upright 90 degrees;Upright 30-60 min after meal                Oral Care  Recommendations: Oral care BID Follow up Recommendations: Inpatient Rehab SLP Visit Diagnosis: Cognitive communication deficit (R41.841);Dysphagia, unspecified (R13.10) Plan: Continue with current plan of care       GO                Virl Axe Keigan Girten 04/03/2018, 3:00 PM  Ivar Drape, M.A. CCC-SLP Acute Herbalist [phone] Office [phone]

## 2018-04-03 NOTE — Progress Notes (Addendum)
Heparin SQ held due to CT angio to rule out dissection ordered. Will continue to monitor and administer after results of scan.

## 2018-04-03 NOTE — Progress Notes (Signed)
PROGRESS NOTE    Kevin Rojas  KMM:[id number] DOB: 1955/11/21 DOA: 03/24/2018 PCP: Kevin Brunette, MD     Brief Narrative:  Kevin Rojas is a 63 year old male with past medical history significant for nephrolithiasis.  Patient presented from home with Vfib cardiac arrest on 3/17. CPR started by wife. Found in Vfib shocked twice and king airway placed. Vomited around [facility] airway. Intubated and stable in ER but not purposeful. Hypothermia protocol started. EKG noted for LBBB. PCCM admitted the patient.  Patient has been extubated.  Patient was transferred to the hospitalist team on 03/29/2018.  Problems include encephalopathy that is resolving, possible aspiration pneumonia, decreased ejection fraction that could be related to the V. fib arrest and cardioversion, acute kidney injury that is resolving, likely bicuspid aortic valve with severe regurgitation and elevated troponin likely secondary to above. He underwent heart cath 3/26 which revealed severe aortic regurgitation with absence of underlying coronary disease.   New events last 24 hours / Subjective: No complaints or issues overnight. Feeling well overall, no chest pain or SOB.   Assessment & Plan:   Principal Problem:   Cardiac arrest [facility]) Active Problems:   LBBB (left bundle branch block)   Elevated troponin   Hypokalemia   Endotracheally intubated   Abnormal CXR   Aspiration pneumonia (HCC)   Severe aortic insufficiency   Acute respiratory failure with hypoxia (HCC)   Aspiration into airway   V. fib arrest Bicuspid aortic valve with severe regurgitation Newly diagnosed acute combined systolic and diastolic congestive heart failure Chronic LBBB Status post intubation and cooling protocol S/p heart cath 3/26 Cardiology, Cardiothoracic surgery, EP following. Plan for AVR and ICD after inpatient rehab, prior to discharge. Awaiting CIR approval   Anoxic encephalopathy Improved  Coronavirus NL63 URI - NOT SARS-CoV2  Improved  Possible aspiration pneumonia Completed unasyn 7 days   AKI Due to cardiogenic shock in setting of V fib Cr stable at 1.28   DVT prophylaxis: Subq hep Code Status: Full Family Communication: No family at bedside  Disposition Plan: CIR eval pending   Consultants:   PCCM admit  Cardiology   Cardiothoracic surgery  EP    Antimicrobials:  Anti-infectives (From admission, onward)   Start     Dose/Rate Route Frequency Ordered Stop   03/31/18 1145  Ampicillin-Sulbactam (UNASYN) 3 g in sodium chloride 0.9 % 100 mL IVPB  Status:  Discontinued     3 g 200 mL/hr over 30 Minutes Intravenous Every 6 hours 03/31/18 1143 03/31/18 1143   03/29/18 1600  Ampicillin-Sulbactam (UNASYN) 3 g in sodium chloride 0.9 % 100 mL IVPB     3 g 200 mL/hr over 30 Minutes Intravenous Every 6 hours 03/29/18 1508 03/30/18 2339   03/25/18 0800  Ampicillin-Sulbactam (UNASYN) 3 g in sodium chloride 0.9 % 100 mL IVPB     3 g 200 mL/hr over 30 Minutes Intravenous Every 6 hours 03/25/18 0721 03/28/18 2107   03/24/18 2300  Ampicillin-Sulbactam (UNASYN) 3 g in sodium chloride 0.9 % 100 mL IVPB  Status:  Discontinued     3 g 200 mL/hr over 30 Minutes Intravenous Every 6 hours 03/24/18 2211 03/25/18 0721   03/24/18 2030  cefTRIAXone (ROCEPHIN) 1 g in sodium chloride 0.9 % 100 mL IVPB  Status:  Discontinued     1 g 200 mL/hr over 30 Minutes Intravenous Every 24 hours 03/24/18 2025 03/24/18 2208   03/24/18 2030  azithromycin (ZITHROMAX) 500 mg in sodium chloride 0.9 % 250 mL IVPB  Status:  Discontinued     500 mg 250 mL/hr over 60 Minutes Intravenous Every 24 hours 03/24/18 2025 03/25/18 1003       Objective: Vitals:   04/02/18 1321 04/02/18 1829 04/02/18 2149 04/03/18 0629  BP: 132/83 (!) 150/95 (!) 151/87 (!) 142/79  Pulse: 74 69 87 89  Resp: Temp:   97.8 F (36.6 C) 100.1 F (37.8 C)  TempSrc:   Oral Oral  SpO2: 98% 98% 100% 98%  Weight:    64.9 kg  Height:         Intake/Output Summary (Last 24 hours) at 04/03/2018 0950 Last data filed at 04/03/2018 3086 Gross per 24 hour  Intake 1327.7 ml  Output 975 ml  Net 352.7 ml   Filed Weights   04/01/18 0522 04/02/18 0455 04/03/18 0629  Weight: 64.5 kg 64.2 kg 64.9 kg    Examination: General exam: Appears calm and comfortable  Respiratory system: Respiratory effort normal. Cardiovascular system: S1 & S2 heard, RRR. Gastrointestinal system: Abdomen is nondistended, soft and nontender. Central nervous system: Alert and oriented. No focal neurological deficits. Extremities: Symmetric Skin: No rashes, lesions or ulcers on exposed skin  Psychiatry: Judgement and insight appear normal. Mood & affect appropriate.     Data Reviewed: I have personally reviewed following labs and imaging studies  CBC: Recent Labs  Lab 03/30/18 0344 03/31/18 0302 04/01/18 0305 04/02/18 0243 04/02/18 0859 04/02/18 0900 04/02/18 0930 04/02/18 1048 04/03/18 0248  WBC 13.3* 11.9* 11.5* 10.4  --   --   --  9.9 8.8  NEUTROABS 10.4*  --   --   --   --   --   --   --   --   HGB 13.6 13.2 13.9 12.3* 11.9* 12.2* 11.2* 12.8* 12.4*  HCT 40.9 41.3 42.6 38.9* 35.0* 36.0* 33.0* 40.3 38.2*  MCV 83.6 84.3 84.7 84.4  --   --   --  85.0 84.9  PLT 219 242 240 251  --   --   --  276 264   Basic Metabolic Panel: Recent Labs  Lab 03/28/18 0443  03/30/18 0344 03/31/18 0302 04/01/18 0305 04/02/18 0243 04/02/18 0859 04/02/18 0900 04/02/18 0930 04/02/18 1048 04/03/18 0248  NA 137   < > 139 141 140 140 142 142 140  --  138  K 4.9   < > 3.8 3.8 3.8 3.8 3.7 3.6 3.6  --  3.7  CL 110   < > 105 108 106 107  --   --   --   --  109  CO2 18*   < > --   --   --   --  23  GLUCOSE 103*   < > 105* 104* 85 91  --   --   --   --  88  BUN 15   < > 24* --   --   --   --  14  CREATININE 1.23   < > 1.35* 1.41* 1.40* 1.36*  --   --   --  1.33* 1.28*  CALCIUM 8.9   < > 9.1 8.9 8.8* 8.7*  --   --   --   --  8.6*  MG 2.0  --   2.3 2.3 2.2 2.0  --   --   --   --   --   PHOS 5.4*  --  3.0  --   --   --   --   --   --   --   --    < > =  values in this interval not displayed.   GFR: Estimated Creatinine Clearance: 54.9 mL/min (A) (by C-G formula based on SCr of 1.28 mg/dL (H)). Liver Function Tests: Recent Labs  Lab 03/30/18 0344 03/31/18 0302  AST  --  23  ALT  --  40  ALKPHOS  --  70  BILITOT  --  1.4*  PROT  --  7.2  ALBUMIN 3.0* 2.8*   No results for input(s): LIPASE, AMYLASE in the last 168 hours. No results for input(s): AMMONIA in the last 168 hours. Coagulation Profile: No results for input(s): INR, PROTIME in the last 168 hours. Cardiac Enzymes: No results for input(s): CKTOTAL, CKMB, CKMBINDEX, TROPONINI in the last 168 hours. BNP (last 3 results) No results for input(s): PROBNP in the last 8760 hours. HbA1C: No results for input(s): HGBA1C in the last 72 hours. CBG: Recent Labs  Lab 03/31/18 1648 03/31/18 2044 04/01/18 0738 04/01/18 1137 04/01/18 1729  GLUCAP 112* 84 98 100* 97   Lipid Profile: No results for input(s): CHOL, HDL, LDLCALC, TRIG, CHOLHDL, LDLDIRECT in the last 72 hours. Thyroid Function Tests: No results for input(s): TSH, T4TOTAL, FREET4, T3FREE, THYROIDAB in the last 72 hours. Anemia Panel: No results for input(s): VITAMINB12, FOLATE, FERRITIN, TIBC, IRON, RETICCTPCT in the last 72 hours. Sepsis Labs: No results for input(s): PROCALCITON, LATICACIDVEN in the last 168 hours.  Recent Results (from the past 240 hour(s))  Respiratory Panel by PCR     Status: Abnormal   Collection Time: 03/24/18 10:07 PM  Result Value Ref Range Status   Adenovirus NOT DETECTED NOT DETECTED Final   Coronavirus 229E NOT DETECTED NOT DETECTED Final    Comment: (NOTE) The Coronavirus on the Respiratory Panel, DOES NOT test for the novel  Coronavirus (2019 nCoV)    Coronavirus HKU1 NOT DETECTED NOT DETECTED Final   Coronavirus NL63 DETECTED (A) NOT DETECTED Final   Coronavirus OC43  NOT DETECTED NOT DETECTED Final   Metapneumovirus NOT DETECTED NOT DETECTED Final   Rhinovirus / Enterovirus NOT DETECTED NOT DETECTED Final   Influenza A NOT DETECTED NOT DETECTED Final   Influenza B NOT DETECTED NOT DETECTED Final   Parainfluenza Virus 1 NOT DETECTED NOT DETECTED Final   Parainfluenza Virus 2 NOT DETECTED NOT DETECTED Final   Parainfluenza Virus 3 NOT DETECTED NOT DETECTED Final   Parainfluenza Virus 4 NOT DETECTED NOT DETECTED Final   Respiratory Syncytial Virus NOT DETECTED NOT DETECTED Final   Bordetella pertussis NOT DETECTED NOT DETECTED Final   Chlamydophila pneumoniae NOT DETECTED NOT DETECTED Final   Mycoplasma pneumoniae NOT DETECTED NOT DETECTED Final    Comment: Performed at [facility], [address] [place], [place] [address]  Culture, blood (routine x 2)     Status: None   Collection Time: 03/25/18 12:42 AM  Result Value Ref Range Status   Specimen Description BLOOD LEFT A-LINE  Final   Special Requests   Final    BOTTLES DRAWN AEROBIC AND ANAEROBIC Blood Culture results may not be optimal due to an excessive volume of blood received in culture bottles   Culture   Final    NO GROWTH 5 DAYS Performed at [facility], [address], [place], [place] [address]    Report Status 03/30/2018 FINAL  Final  Culture, blood (routine x 2)     Status: None   Collection Time: 03/25/18 12:44 AM  Result Value Ref Range Status   Specimen Description BLOOD LEFT HAND  Final  Special Requests   Final    BOTTLES DRAWN AEROBIC ONLY Blood Culture adequate volume   Culture   Final    NO GROWTH 5 DAYS Performed at [facility], [address] [place], [place] [address]    Report Status 03/30/2018 FINAL  Final  MRSA PCR Screening     Status: None   Collection Time: 03/25/18  1:14 AM  Result Value Ref Range Status   MRSA by PCR NEGATIVE NEGATIVE Final    Comment:        The GeneXpert MRSA Assay (FDA approved for NASAL specimens only), is one  component of a comprehensive MRSA colonization surveillance program. It is not intended to diagnose MRSA infection nor to guide or monitor treatment for MRSA infections. Performed at [facility], [address] [place], [place] [address]   Culture, respiratory (non-expectorated)     Status: None   Collection Time: 03/25/18  2:51 AM  Result Value Ref Range Status   Specimen Description TRACHEAL ASPIRATE  Final   Special Requests NONE  Final   Gram Stain   Final    RARE WBC PRESENT, PREDOMINANTLY PMN FEW GRAM POSITIVE COCCI IN PAIRS IN CLUSTERS RARE YEAST    Culture   Final    FEW Consistent with normal respiratory flora. Performed at [facility], [address], [place], [place] [address]    Report Status 03/27/2018 FINAL  Final       Radiology Studies: Vas US Doppler Pre Cabg  Result Date: 04/02/2018 PREOPERATIVE VASCULAR EVALUATION  Indications: Pre cabg. Performing Technologist: Olen Cordial Rvt  Examination Guidelines: A complete evaluation includes B-mode imaging, spectral Doppler, color Doppler, and power Doppler as needed of all accessible portions of each vessel. Bilateral testing is considered an integral part of a complete examination. Limited examinations for reoccurring indications may be performed as noted.  Right Carotid Findings: +----------+--------+--------+--------+-----------+--------+           PSV cm/sEDV cm/sStenosisDescribe   Comments +----------+--------+--------+--------+-----------+--------+ CCA Prox  91                      homogeneous         +----------+--------+--------+--------+-----------+--------+ CCA Distal63      12              homogeneous         +----------+--------+--------+--------+-----------+--------+ ICA Prox  67      17      1-39%   homogeneous         +----------+--------+--------+--------+-----------+--------+ ICA Distal69      17                                   +----------+--------+--------+--------+-----------+--------+ ECA       106                                         +----------+--------+--------+--------+-----------+--------+ +----------+--------+-------+--------+------------+           PSV cm/sEDV cmsDescribeArm Pressure +----------+--------+-------+--------+------------+ Subclavian118                    119          +----------+--------+-------+--------+------------+ +---------+--------+--+--------+-+---------+ VertebralPSV cm/s51EDV cm/s8Antegrade +---------+--------+--+--------+-+---------+ Left Carotid Findings: +----------+--------+--------+--------+-----------+--------+           PSV cm/sEDV cm/sStenosisDescribe  Comments +----------+--------+--------+--------+-----------+--------+ CCA Prox  87                      homogeneous         +----------+--------+--------+--------+-----------+--------+ CCA Distal93                      homogeneous         +----------+--------+--------+--------+-----------+--------+ ICA Prox  68      12      1-39%   homogeneous         +----------+--------+--------+--------+-----------+--------+ ICA Distal37      12                                  +----------+--------+--------+--------+-----------+--------+ ECA       83                                          +----------+--------+--------+--------+-----------+--------+ +----------+--------+--------+--------+------------+ SubclavianPSV cm/sEDV cm/sDescribeArm Pressure +----------+--------+--------+--------+------------+           127                     127          +----------+--------+--------+--------+------------+ +---------+--------+--+--------+--+---------+ VertebralPSV cm/s49EDV cm/s13Antegrade +---------+--------+--+--------+--+---------+  ABI Findings: +--------+------------------+-----+---------+--------+ Right   Rt Pressure (mmHg)IndexWaveform Comment   +--------+------------------+-----+---------+--------+ [id number]                    triphasic         +--------+------------------+-----+---------+--------+ PTA     175               1.38 triphasic         +--------+------------------+-----+---------+--------+ DP      193               1.52 triphasic         +--------+------------------+-----+---------+--------+ +--------+------------------+-----+---------+-------+ Left    Lt Pressure (mmHg)IndexWaveform Comment +--------+------------------+-----+---------+-------+ [id number]                    triphasic        +--------+------------------+-----+---------+-------+ PTA     180               1.42 triphasic        +--------+------------------+-----+---------+-------+ DP      197               1.55 triphasic        +--------+------------------+-----+---------+-------+ +-------+---------------+----------------+ ABI/TBIToday's ABI/TBIPrevious ABI/TBI +-------+---------------+----------------+ Right  1.52                            +-------+---------------+----------------+ Left   1.55                            +-------+---------------+----------------+  Right Doppler Findings: +-----------+--------+-----+---------+-----------------------------------------+ Site       PressureIndexDoppler  Comments                                  +-----------+--------+-----+---------+-----------------------------------------+ Brachial   119          triphasic                                          +-----------+--------+-----+---------+-----------------------------------------+  Radial                  triphasic                                          +-----------+--------+-----+---------+-----------------------------------------+ Ulnar                   triphasic                                          +-----------+--------+-----+---------+-----------------------------------------+ Palmar Arch                       Palmar waveforms are obliterated with                                      radial and ulnar compression.             +-----------+--------+-----+---------+-----------------------------------------+  Left Doppler Findings: +-----------+--------+-----+---------+-----------------------------------------+ Site       PressureIndexDoppler  Comments                                  +-----------+--------+-----+---------+-----------------------------------------+ Brachial   127          triphasic                                          +-----------+--------+-----+---------+-----------------------------------------+ Radial                  triphasic                                          +-----------+--------+-----+---------+-----------------------------------------+ Ulnar                   triphasic                                          +-----------+--------+-----+---------+-----------------------------------------+ Palmar Arch                      Palmar waveforms are obliterated with                                      radial and ulnar compression.             +-----------+--------+-----+---------+-----------------------------------------+  Summary: Right Carotid: Velocities in the right ICA are consistent with a 1-39% stenosis. Left Carotid: Velocities in the left ICA are consistent with a 1-39% stenosis. Vertebrals: Bilateral vertebral arteries demonstrate antegrade flow. Right ABI: Resting right ankle-brachial index indicates noncompressible right lower extremity arteries. Left ABI: Resting left ankle-brachial index indicates noncompressible left lower extremity arteries.  Electronically signed by Sherald Hess MD on 04/02/2018 at 5:46:33 PM.    Final       Scheduled Meds: .  chlorhexidine  15 mL Mouth Rinse BID  . feeding supplement (ENSURE ENLIVE)  237 mL Oral BID BM  . heparin  5,000 Units Subcutaneous Q8H  . mouth rinse  15 mL Mouth Rinse  [id number]  . pantoprazole  40 mg Oral Q1200  . sodium chloride flush  3 mL Intravenous Q12H   Continuous Infusions: . sodium chloride Stopped (04/02/18 1800)     LOS: 10 days    Time spent: 25 minutes   Noralee Stain, DO [facility] [email or web address] 04/03/2018, 9:50 AM

## 2018-04-03 NOTE — Progress Notes (Addendum)
Inpatient Rehabilitation Admissions Coordinator  Discussed case with Dr. Riley Kill. I have insurance approval to admit patient to inpt rehab Saturday, after his Life Vest is applied/delivered. I will arrange with Dr. Wynn Banker to admit Saturday. RN can call unit [phone] to call report tomorrow after 12 noon. [phone] for Charge Nurse.I have notified Dr. Alvino Chapel of planned admit to [facility] Saturday.  Ottie Glazier, RN, MSN Rehab Admissions Coordinator [phone] 04/03/2018 3:14 PM

## 2018-04-03 NOTE — Progress Notes (Addendum)
The patient has been seen in conjunction with Laverda Page, NP. All aspects of care have been considered and discussed. The patient has been personally interviewed, examined, and all clinical data has been reviewed.   Appreciate T CTS consultation from Dr. Tyrone Sage.  Plan will be to have in patient rehab then aortic valve surgery before discharge home.  EP has been consulted to give an opinion concerning safety/prevention of recurrent sudden death while in rehab and also long-term post aortic valve surgery.  Progress Note  Patient Name: Kevin Rojas Date of Encounter: 04/03/2018  Primary Cardiologist: Tonny Bollman, MD   Subjective   No concerns noted overnight.   Inpatient Medications    Scheduled Meds:  chlorhexidine  15 mL Mouth Rinse BID   feeding supplement (ENSURE ENLIVE)  237 mL Oral BID BM   heparin  5,000 Units Subcutaneous Q8H   mouth rinse  15 mL Mouth Rinse q12n4p   pantoprazole  40 mg Oral Q1200   sodium chloride flush  3 mL Intravenous Q12H   Continuous Infusions:  sodium chloride Stopped (04/02/18 1800)   PRN Meds: sodium chloride, acetaminophen, albuterol, diazepam, hydrALAZINE, ondansetron (ZOFRAN) IV, sodium chloride flush   Vital Signs    Vitals:   04/02/18 1321 04/02/18 1829 04/02/18 2149 04/03/18 0629  BP: 132/83 (!) 150/95 (!) 151/87 (!) 142/79  Pulse: 74 69 87 89  Resp: Temp:   97.8 F (36.6 C) 100.1 F (37.8 C)  TempSrc:   Oral Oral  SpO2: 98% 98% 100% 98%  Weight:    64.9 kg  Height:        Intake/Output Summary (Last 24 hours) at 04/03/2018 0851 Last data filed at 04/03/2018 1610 Gross per 24 hour  Intake 1327.7 ml  Output 975 ml  Net 352.7 ml   Last 3 Weights 04/03/2018 04/02/2018 04/01/2018  Weight (lbs) 143 lb 1.6 oz 141 lb 8 oz 142 lb 1.6 oz  Weight (kg) 64.91 kg 64.184 kg 64.456 kg      Telemetry    SR- Personally Reviewed  ECG    N/a - Personally Reviewed  Physical Exam   Exam per MD.  GEN:  No acute distress.   Neck: No JVD Cardiac: RRR, no murmurs, rubs, or gallops.  Respiratory: Clear to auscultation bilaterally. GI: Soft, nontender, non-distended  MS: No edema; No deformity. Neuro:  Nonfocal  Psych: Normal affect   Labs    Chemistry Recent Labs  Lab 03/30/18 0344 03/31/18 0302 04/01/18 0305 04/02/18 0243  04/02/18 0900 04/02/18 0930 04/02/18 1048 04/03/18 0248  NA 139 141 140 140   < > 142 140  --  138  K 3.8 3.8 3.8 3.8   < > 3.6 3.6  --  3.7  CL 105 108 106 107  --   --   --   --  109  CO2 --   --   --   --  23  GLUCOSE 105* 104* 85 91  --   --   --   --  88  BUN 24* --   --   --   --  14  CREATININE 1.35* 1.41* 1.40* 1.36*  --   --   --  1.33* 1.28*  CALCIUM 9.1 8.9 8.8* 8.7*  --   --   --   --  8.6*  PROT  --  7.2  --   --   --   --   --   --   --  ALBUMIN 3.0* 2.8*  --   --   --   --   --   --   --   AST  --  23  --   --   --   --   --   --   --   ALT  --  40  --   --   --   --   --   --   --   ALKPHOS  --  70  --   --   --   --   --   --   --   BILITOT  --  1.4*  --   --   --   --   --   --   --   GFRNONAA 56* 53* 53* 55*  --   --   --  57* 60*  GFRAA >60 >60 >60 >60  --   --   --  >60 >60  ANIONGAP 11 10 10 10   --   --   --   --  6   < > = values in this interval not displayed.     Hematology Recent Labs  Lab 04/02/18 0243  04/02/18 0930 04/02/18 1048 04/03/18 0248  WBC 10.4  --   --  9.9 8.8  RBC 4.61  --   --  4.74 4.50  HGB 12.3*   < > 11.2* 12.8* 12.4*  HCT 38.9*   < > 33.0* 40.3 38.2*  MCV 84.4  --   --  85.0 84.9  MCH 26.7  --   --  27.0 27.6  MCHC 31.6  --   --  31.8 32.5  RDW 13.8  --   --  13.9 14.1  PLT 251  --   --  276 264   < > = values in this interval not displayed.    Cardiac EnzymesNo results for input(s): TROPONINI in the last 168 hours. No results for input(s): TROPIPOC in the last 168 hours.   BNPNo results for input(s): BNP, PROBNP in the last 168 hours.   DDimer No results for  input(s): DDIMER in the last 168 hours.   Radiology    Vas US Doppler Pre Cabg  Result Date: 04/02/2018 PREOPERATIVE VASCULAR EVALUATION  Indications: Pre cabg. Performing Technologist: Olen Cordial Rvt  Examination Guidelines: A complete evaluation includes B-mode imaging, spectral Doppler, color Doppler, and power Doppler as needed of all accessible portions of each vessel. Bilateral testing is considered an integral part of a complete examination. Limited examinations for reoccurring indications may be performed as noted.  Right Carotid Findings: +----------+--------+--------+--------+-----------+--------+             PSV cm/s EDV cm/s Stenosis Describe    Comments  +----------+--------+--------+--------+-----------+--------+  CCA Prox   91                         homogeneous           +----------+--------+--------+--------+-----------+--------+  CCA Distal 63       12                homogeneous           +----------+--------+--------+--------+-----------+--------+  ICA Prox   67       17       1-39%    homogeneous           +----------+--------+--------+--------+-----------+--------+  ICA Distal 69  17                                      +----------+--------+--------+--------+-----------+--------+  ECA        106                                              +----------+--------+--------+--------+-----------+--------+ +----------+--------+-------+--------+------------+             PSV cm/s EDV cms Describe Arm Pressure  +----------+--------+-------+--------+------------+  Subclavian 118                       119           +----------+--------+-------+--------+------------+ +---------+--------+--+--------+-+---------+  Vertebral PSV cm/s 51 EDV cm/s 8 Antegrade  +---------+--------+--+--------+-+---------+ Left Carotid Findings: +----------+--------+--------+--------+-----------+--------+             PSV cm/s EDV cm/s Stenosis Describe    Comments   +----------+--------+--------+--------+-----------+--------+  CCA Prox   87                         homogeneous           +----------+--------+--------+--------+-----------+--------+  CCA Distal 93                         homogeneous           +----------+--------+--------+--------+-----------+--------+  ICA Prox   68       12       1-39%    homogeneous           +----------+--------+--------+--------+-----------+--------+  ICA Distal 37       12                                      +----------+--------+--------+--------+-----------+--------+  ECA        83                                               +----------+--------+--------+--------+-----------+--------+ +----------+--------+--------+--------+------------+  Subclavian PSV cm/s EDV cm/s Describe Arm Pressure  +----------+--------+--------+--------+------------+             127                        127           +----------+--------+--------+--------+------------+ +---------+--------+--+--------+--+---------+  Vertebral PSV cm/s 49 EDV cm/s 13 Antegrade  +---------+--------+--+--------+--+---------+  ABI Findings: +--------+------------------+-----+---------+--------+  Right    Rt Pressure (mmHg) Index Waveform  Comment   +--------+------------------+-----+---------+--------+  Brachial 119                      triphasic           +--------+------------------+-----+---------+--------+  PTA      175                1.38  triphasic           +--------+------------------+-----+---------+--------+  DP       193  1.52  triphasic           +--------+------------------+-----+---------+--------+ +--------+------------------+-----+---------+-------+  Left     Lt Pressure (mmHg) Index Waveform  Comment  +--------+------------------+-----+---------+-------+  Brachial 127                      triphasic          +--------+------------------+-----+---------+-------+  PTA      180                1.42  triphasic           +--------+------------------+-----+---------+-------+  DP       197                1.55  triphasic          +--------+------------------+-----+---------+-------+ +-------+---------------+----------------+  ABI/TBI Today's ABI/TBI Previous ABI/TBI  +-------+---------------+----------------+  Right   1.52                              +-------+---------------+----------------+  Left    1.55                              +-------+---------------+----------------+  Right Doppler Findings: +-----------+--------+-----+---------+-----------------------------------------+  Site        Pressure Index Doppler   Comments                                   +-----------+--------+-----+---------+-----------------------------------------+  Brachial    119            triphasic                                            +-----------+--------+-----+---------+-----------------------------------------+  Radial                     triphasic                                            +-----------+--------+-----+---------+-----------------------------------------+  Ulnar                      triphasic                                            +-----------+--------+-----+---------+-----------------------------------------+  Palmar Arch                          Palmar waveforms are obliterated with                                            radial and ulnar compression.              +-----------+--------+-----+---------+-----------------------------------------+  Left Doppler Findings: +-----------+--------+-----+---------+-----------------------------------------+  Site        Pressure Index Doppler   Comments                                   +-----------+--------+-----+---------+-----------------------------------------+  Brachial    127            triphasic                                            +-----------+--------+-----+---------+-----------------------------------------+  Radial                     triphasic                                             +-----------+--------+-----+---------+-----------------------------------------+  Ulnar                      triphasic                                            +-----------+--------+-----+---------+-----------------------------------------+  Palmar Arch                          Palmar waveforms are obliterated with                                            radial and ulnar compression.              +-----------+--------+-----+---------+-----------------------------------------+  Summary: Right Carotid: Velocities in the right ICA are consistent with a 1-39% stenosis. Left Carotid: Velocities in the left ICA are consistent with a 1-39% stenosis. Vertebrals: Bilateral vertebral arteries demonstrate antegrade flow. Right ABI: Resting right ankle-brachial index indicates noncompressible right lower extremity arteries. Left ABI: Resting left ankle-brachial index indicates noncompressible left lower extremity arteries.  Electronically signed by Sherald Hess MD on 04/02/2018 at 5:46:33 PM.    Final     Cardiac Studies   Right and left heart catheterization with coronary angiography: 04/02/2018  Normal right heart pressures.  Mild global LV dysfunction with an EF 45 to 50%.  Bicuspid aortic valve with severe 4+ aortic insufficiency, mild aortic stenosis.  Dilated aortic root.  Normal coronary arteries and a left dominant circulation.  Right Heart Pressures RA: A-wave 7, V wave 4; mean 4 RV: 27/6 PA: 23/9; mean 15 PW: A-wave 11 V wave 10; mean 9  Initial AO 124/71. Initial LV: 144/9  Pullback: LV: 148/8 Ao: 133/70  Oxygen saturation in the pulmonary artery 74% in the aorta 96%.  By the [facility] method cardiac output 6.5 L/min and cardiac index 3.6 L/min/m.  Mild aortic stenosis with a probable congenital bicuspid valve with a peak to peak gradient of 15 mm Hg, mean gradient of 14 mm Hg and calculated valve area 2.3 cm. Severe 4+ aortic insufficiency.  EF  estimate 45 to 50% as assessed by supravalvular aortography/AR with LVEDP at 9 mm.   2D Doppler echocardiogram 03/25/2018:  IMPRESSIONS 1. The left ventricle has mildly reduced systolic function, with an ejection fraction of 45-50%. The cavity size was mildly dilated. Left ventricular diastolic Doppler parameters are consistent with pseudonormalization. 2. The right ventricle has normal systolic function. The cavity was normal. There is no increase in right ventricular wall  thickness. 3. Right atrial size was mildly dilated. 4. Trivial pericardial effusion is present. 5. Mild thickening of the mitral valve leaflet. 6. The aortic valveis bicuspid Aortic valve regurgitation is severe by color flow Doppler. 7. The aortic valve appears to be a bicuspid or functionally bicuspid AV. There is severe AI. The aortic root is mildly dilated. 8. There is mild dilatation of the ascending aorta. 9. The aortic valve appears to be a bicuspid or functionally bicuspid AV. There is severe AI. The aortic root is mildly dilated . There is concern for a Type A aortic dissection. Suggest STAT CT angiogram of the chest for further evaluation. 10. The inferior vena cava was normal in size with <50% respiratory variability. 11. The interatrial septum was not assessed.   Patient Profile     63 y.o. male with ventricular fibrillation cardiac arrest, successfully resuscitated.  Work-up has included identification of severe aortic regurgitation with LV enlargement.  Postarrest the patient has slowly progressed and cognitive impairment and functional ability is significantly improved.  Amiodarone was discontinued on 318/2020 due to severe bradycardia while on hypothermia protocol.  Cardiac cath demonstrated widely patent coronaries with left dominant anatomy.  Normal pulmonary pressures and documentation of severe aortic regurgitation.  Assessment & Plan    1. Ventricular fibrillation  cardiac arrest.  Unknown etiology.  Has mild LV dysfunction and and hypokalemia, 3.2 on presentation. EP aware of patient, though appears plan will be for AVR per TCTS after he has had a chance to rehab. Then possible AICD.  2. Severe aortic regurgitation and mild aortic stenosis of bicuspid valve, not previously recognized before admission. Cath yesterday above. Seen by Dr Tyrone Sage with plans for AVR ideally once patient has undergone some inpatient rehab.   3. Acute on chronic combined systolic and diastolic heart failure, without current evidence of volume overload.  Renal function improving. Plan to add ACEi/ARB therapy as long as stable.  -- avoid BB given baseline bardycardia  4. Stage III chronic kidney disease, stage III.  Cr trending down. Would plan to add ACE/ARB with stable trend.   5. Physical deconditioning, improving. Working with PT with recommendation for CIR once medically stable.   6. Anoxic brain injury, markedly improved.  7. Non-ST elevation MI: is consistent with myocardial infarction in the absence of obstructive coronary artery disease noted on cath yesterday.   For questions or updates, please contact [facility] Please consult [email or web address] for contact info under   Signed, Laverda Page, NP  04/03/2018, 8:51 AM

## 2018-04-03 NOTE — Discharge Instructions (Signed)
°  PLEASE SIGN UP TO YOUR MY CHART ACCOUNT WHEN YOU GET HOME (if you aren't already).  IN THE CURRENT ENVIRONMENT WITH COVID-19, IN EFFORT TO REDUCE YOUR EXPOSURE WE WILL BE CONDUCTING MANY PATIENT VISITS BY EITHER ELECTRONIC/VIRTUAL VISITS or TELEPHONE VISITS.  BEING SIGNED UP IN YOUR MY CHART ACCOUNT  WILL HELP FACILITATE THESE VISITS AND OUR COMMUNICATION WITH YOU

## 2018-04-03 NOTE — Progress Notes (Signed)
Inpatient Rehabilitation Admissions Coordinator  Discussed with Dr. Alvino Chapel, RN CM, Steward Drone, as well as patient and his son, Janette, Douros. I will begin insurance authorization with [employer] for a possible admission to inpt rehab. RN CM to arrange for LifeVest to be obtained prior to d/c. If [employer] approves an inpt rehab admission, we would need Life Vest arrival prior to d/c to CIR. I will follow up with team upon [facility] decision.  Ottie Glazier, RN, MSN Rehab Admissions Coordinator [phone] 04/03/2018 10:49 AM

## 2018-04-03 NOTE — Progress Notes (Signed)
Occupational Therapy Treatment Patient Details Name: Lenoxx Cassada MRN: [id number] DOB: 07-Sep-1955 Today's Date: 04/03/2018    History of present illness Pt is a 63 year old man admitted 03/24/18 with V fib arrest, requiring CPR. ETT 3/17-3/21. Hospital course complicated by ongoing vomiting, NGT placed 3/21. Pt is s/p LHC, coronary angiography on 3/26 which revealed severe aortic regurgitation. Cardiology following. PMH includes chronic L bundle branch block.    OT comments  Pt demonstrates error in recall with Bless test (5 out 6) and multiple errors in Pill box test without awareness. Pt engaged in task and receive to feedback regarding need for assistance with medications. Pt with pending CIR admission per chart.   Follow Up Recommendations  CIR    Equipment Recommendations  3 in 1 bedside commode    Recommendations for Other Services Rehab consult    Precautions / Restrictions Precautions Precautions: Fall;Other (comment) Precaution Comments: s/p r heart cath       Mobility Bed Mobility               General bed mobility comments: in chai ron arrrival  Transfers                 General transfer comment: seated on arrival and seated task    Balance                                           ADL either performed or assessed with clinical judgement   ADL Overall ADL's : Needs assistance/impaired                                       General ADL Comments: session focused on cognition with pill box test. pt required 17 minutes to complete task. pt unable to recall address with BLESS test but answering all other questions correct.      Vision       Perception     Praxis      Cognition Arousal/Alertness: Awake/alert Behavior During Therapy: WFL for tasks assessed/performed Overall Cognitive Status: Impaired/Different from baseline                                 General Comments: pt provided a pill  box test and noted to have errors without awarness. pt was able to complete 1 pill a day instructions but when asked to complete multiple steps ( 3 pills per day at meals.) Pt could not figure out when meals occur. pt talking the whole task out loud to therapist and only given cues to finish the task and i will answer your questionsa t the end. Pt is a agreeable to home health RN to help with medication after this task. pt normally helps set up wife pill box.         Exercises     Shoulder Instructions       General Comments      Pertinent Vitals/ Pain       Pain Assessment: No/denies pain  Home Living  Prior Functioning/Environment              Frequency  Min 2X/week        Progress Toward Goals  OT Goals(current goals can now be found in the care plan section)  Progress towards OT goals: Progressing toward goals  Acute Rehab OT Goals Patient Stated Goal: to see wife OT Goal Formulation: With patient/family Time For Goal Achievement: 04/12/18 Potential to Achieve Goals: Good ADL Goals Pt Will Perform Eating: sitting;Independently Pt Will Perform Grooming: with min guard assist;standing Pt Will Perform Upper Body Dressing: with set-up;sitting Pt Will Perform Lower Body Dressing: with min guard assist;sit to/from stand Pt Will Transfer to Toilet: with min guard assist;ambulating Pt Will Perform Toileting - Clothing Manipulation and hygiene: with min guard assist;sit to/from stand Additional ADL Goal #1: Pt will participate in formal cognitive assessment to determine any deficits.  Plan Discharge plan remains appropriate    Co-evaluation                 AM-PAC OT "6 Clicks" Daily Activity     Outcome Measure   Help from another person eating meals?: None Help from another person taking care of personal grooming?: A Little Help from another person toileting, which includes using toliet, bedpan, or  urinal?: A Little Help from another person bathing (including washing, rinsing, drying)?: A Little Help from another person to put on and taking off regular upper body clothing?: None Help from another person to put on and taking off regular lower body clothing?: A Little 6 Click Score: 20    End of Session    OT Visit Diagnosis: Unsteadiness on feet (R26.81);Other abnormalities of gait and mobility (R26.89);Muscle weakness (generalized) (M62.81)   Activity Tolerance Patient tolerated treatment well   Patient Left in chair;with call bell/phone within reach   Nurse Communication Mobility status;Precautions        Time: 6734-1937 OT Time Calculation (min): 28 min  Charges: OT General Charges $OT Visit: 1 Visit OT Treatments $Cognitive Funtion inital: Initial 15 mins $Cognitive Funtion additional: Additional15 mins   Mateo Flow, OTR/L  Acute Rehabilitation Services Pager: [phone] Office: [phone] .    Mateo Flow 04/03/2018, 3:17 PM

## 2018-04-04 ENCOUNTER — Inpatient Hospital Stay (HOSPITAL_COMMUNITY): Payer: BLUE CROSS/BLUE SHIELD

## 2018-04-04 ENCOUNTER — Encounter (HOSPITAL_COMMUNITY): Payer: Self-pay | Admitting: Radiology

## 2018-04-04 LAB — BASIC METABOLIC PANEL
Anion gap: 6 (ref 5–15)
BUN: 12 mg/dL (ref 8–23)
CO2: 23 mmol/L (ref 22–32)
Calcium: 8.6 mg/dL — ABNORMAL LOW (ref 8.9–10.3)
Chloride: 108 mmol/L (ref 98–111)
Creatinine, Ser: 1.25 mg/dL — ABNORMAL HIGH (ref 0.61–1.24)
GFR calc non Af Amer: >60 mL/min (ref >60)
Glucose, Bld: 141 mg/dL — ABNORMAL HIGH (ref 70–99)
Potassium: 3.6 mmol/L (ref 3.5–5.1)
SODIUM: 137 mmol/L (ref 135–145)

## 2018-04-04 MED ORDER — ENOXAPARIN SODIUM 100 MG/ML ~~LOC~~ SOLN
1.5000 mg/kg | SUBCUTANEOUS | Status: DC
  Administered 2018-04-04 – 2018-04-05 (×2): 95 mg via SUBCUTANEOUS
  Filled 2018-04-04 (×2): qty 1

## 2018-04-04 MED ORDER — ENOXAPARIN SODIUM 100 MG/ML ~~LOC~~ SOLN: 1.5000 mg/kg | SUBCUTANEOUS | Status: DC

## 2018-04-04 MED ORDER — IOHEXOL 350 MG/ML SOLN
100.0000 mL | Freq: Once | INTRAVENOUS | Status: AC | PRN
  Administered 2018-04-04: 100 mL via INTRAVENOUS

## 2018-04-04 NOTE — Progress Notes (Addendum)
ANTICOAGULATION CONSULT NOTE - Initial Consult  Pharmacy Consult for lovenox Indication: pulmonary embolus  No Known Allergies  Patient Measurements: Height: 5\' 10"  (177.8 cm) Weight: 142 lb 11.2 oz (64.7 kg) IBW/kg (Calculated) : 73  Vital Signs: Temp: 98.6 F (37 C) (03/28 0605) Temp Source: Oral (03/28 0605) BP: 131/76 (03/28 1005) Pulse Rate: 100 (03/28 1005)  Labs: Recent Labs    04/02/18 0243  04/02/18 0930 04/02/18 1048 04/03/18 0248 04/04/18 0308  HGB 12.3*   < > 11.2* 12.8* 12.4*  --   HCT 38.9*   < > 33.0* 40.3 38.2*  --   PLT 251  --   --  276 264  --   CREATININE 1.36*  --   --  1.33* 1.28* 1.25*   < > = values in this interval not displayed.    Estimated Creatinine Clearance: 56.1 mL/min (A) (by C-G formula based on SCr of 1.25 mg/dL (H)).   Medical History: History reviewed. No pertinent past medical history.  Assessment: 49 YOM now with new PE, on DVT ppx subQ heparin Last SubQ heparin dose 3/27 at 0646 CBC low but stable/improving   Goal of Therapy:  Monitor platelets by anticoagulation protocol: Yes   Plan:  D/c heparin subQ Lovenox 1.5mg /kg (95mg ) subQ every 24 hours  Daylene Posey, PharmD Clinical Pharmacist Please check AMION for all [facility] numbers 04/04/2018 1:13 PM

## 2018-04-04 NOTE — Progress Notes (Signed)
CARDIAC REHAB PHASE I   PRE:  Rate/Rhythm: 100 ST    BP: sitting 126/87    SaO2:   MODE:  Ambulation: 326 ft   POST:  Rate/Rhythm: 125 ST at sink after bathing, 120 ST after walking    BP: sitting 131/76     SaO2: 98 RA  Pt at sink, just washed up. Seems to be moving independently, just slowly. Asked to use RW in hall. Slow, steady pace. Shuffles feet. Able to increase distance. To recliner after walk. Discussed sternal precautions, IS (1250 mL), d/c planning, and mobility post op. Gave him [facility] booklet, Move in the Tube guide, and careguide. Encouraged pt to watch preop video and Lifevest video and also read information. He is working on his cognition but recognizes he has deficits at times. Eager to get stronger/better.  [id number]   Harriet Masson CES, ACSM 04/04/2018 10:26 AM

## 2018-04-04 NOTE — Discharge Summary (Addendum)
Physician Discharge Summary  Kevin Rojas HYQ:[id number] DOB: [birth year] DOA: 03/24/2018  PCP: [name], MD  Admit date: 03/24/2018 Discharge date: 04/04/2018  Admitted From: Home Disposition:  CIR   Will need re-eval for AVR (Dr. Tyrone Sage, Cardiothoracic surgery) prior to discharge from CIR. Lifevest ordered and in patient's room at time of discharge to CIR. Need follow up with Dr. Ladona Ridgel, EP, for eventual ICD placement.   Discharge Condition: Stable CODE STATUS: Full  Diet recommendation: Heart healthy   Brief/Interim Summary: Kevin Rojas is a 63 year old male with past medical history significant for nephrolithiasis. Patient presented from home with Vfib cardiac arrest on 3/17. CPR started by wife. Found in Vfib shocked twice and king airway placed. Vomited around [facility] airway. Intubated and stable in ER but not purposeful. Hypothermia protocol started. EKG noted for LBBB. PCCMadmitted the patient. Patient has been extubated. Patient was transferred to the hospitalist team on 03/29/2018. Problems include encephalopathy that is resolving, possible aspiration pneumonia, decreased ejection fraction that could be related to the V. fib arrest and cardioversion, acute kidney injury that is resolving, likely bicuspid aortic valve with severe regurgitation and elevated troponin likely secondary to above. He underwent heart cath 3/26 which revealed severe aortic regurgitation with absence of underlying coronary disease. He has been evaluated by cardiology, cardiothoracic surgery, EP.   Discharge Diagnoses:  Principal Problem:   Cardiac arrest [facility]) Active Problems:   LBBB (left bundle branch block)   Elevated troponin   Hypokalemia   Endotracheally intubated   Abnormal CXR   Aspiration pneumonia (HCC)   Severe aortic insufficiency   Acute respiratory failure with hypoxia (HCC)   Aspiration into airway  V. fib arrest Bicuspid aortic valve with severe regurgitation Newly  diagnosed acute combined systolic and diastolic congestive heart failure Chronic LBBB Status post intubation and cooling protocol S/p heart cath 3/26 Cardiology, Cardiothoracic surgery, EP following. Plan for AVR and ICD after CIR   Right subsegmental nonocclusive PE CTA C/A/P was completed as work up for AVR. Incidentally found PE. Spoke with radiologist, low burden clot, this was not present on CTA chest on 3/17. Will start lovenox. Patient may continue this at [facility] and hold pending surgical procedures. Patient has no hypoxemia, LE edema, pleuritic chest pain, or dyspnea on exam.   Anoxic encephalopathy Resolved, back to baseline   Coronavirus NL63 URI - NOT SARS-CoV2 Improved with supportive care   Possible aspiration pneumonia Completed unasyn 7 days   AKI Due to cardiogenic shock in setting of V fib Cr stable at 1.25    Discharge Instructions  Discharge Instructions    Diet - low sodium heart healthy   Complete by:  As directed    Increase activity slowly   Complete by:  As directed      Allergies as of 04/04/2018   No Known Allergies     Medication List    STOP taking these medications   acetaminophen 500 MG tablet Commonly known as:  TYLENOL   HYDROcodone-acetaminophen 5-325 MG tablet Commonly known as:  NORCO/VICODIN   ondansetron 8 MG disintegrating tablet Commonly known as:  Zofran ODT     TAKE these medications   enoxaparin 100 MG/ML injection Commonly known as:  LOVENOX Inject 0.95 mLs (95 mg total) into the skin daily.      Follow-up Information    Marinus Maw, MD Follow up.   Specialty:  Cardiology Why:  04/29/2018 @ 11:30AM Contact information: [address] [place] [address] [phone]  No Known Allergies  Consultations:  PCCM admit  Cardiology  Cardiothoracic surgery  EP    Procedures/Studies: Ct Head Wo Contrast  Result Date: 03/24/2018 CLINICAL DATA:  Cardiac arrest EXAM: CT  HEAD WITHOUT CONTRAST TECHNIQUE: Contiguous axial images were obtained from the base of the skull through the vertex without intravenous contrast. COMPARISON:  None. FINDINGS: Brain: No acute territorial infarction, hemorrhage or intracranial mass. The ventricles are of normal size Vascular: No hyperdense vessels.  No unexpected calcification Skull: Normal. Negative for fracture or focal lesion. Sinuses/Orbits: Mucosal thickening in the ethmoid sinuses. Large amount of secretions within the posterior nasopharynx with scattered hyperdense foci, possibly hemorrhage. Other: None IMPRESSION: 1. Negative non contrasted CT appearance of the brain 2. Large amount of secretions at the posterior nasopharynx, possibly with small amount of hemorrhagic secretion. Electronically Signed   By: Jasmine Pang M.D.   On: 03/24/2018 22:39   Ct Angio Chest Pe W Or Wo Contrast  Result Date: 03/24/2018 CLINICAL DATA:  Cardiac arrest.  Intubated patient. EXAM: CT ANGIOGRAPHY CHEST WITH CONTRAST TECHNIQUE: Multidetector CT imaging of the chest was performed using the standard protocol during bolus administration of intravenous contrast. Multiplanar CT image reconstructions and MIPs were obtained to evaluate the vascular anatomy. CONTRAST:  87mL OMNIPAQUE IOHEXOL 350 MG/ML SOLN COMPARISON:  Current chest radiographs. FINDINGS: Cardiovascular: There is satisfactory opacification of the pulmonary arteries to the segmental level. There is no evidence of a pulmonary embolism. Heart is mildly enlarged. No pericardial effusion. No coronary artery calcifications. Ascending aorta is dilated to 4.4 cm. Aorta is not opacified. No atherosclerotic calcifications. Mediastinum/Nodes: No neck base, no neck base or axillary masses or enlarged lymph nodes. Endotracheal tube and nasal/orogastric tube are well positioned. Subcentimeter shotty mediastinal lymph nodes. No discrete enlarged mediastinal or hilar lymph nodes. No masses. Trachea is  unremarkable. Lungs/Pleura: Bilateral dependent lung consolidation. Airspace consolidation is noted in the posterior right upper lobe with a lesser degree of posterior consolidation in the right lower lobe. There is less extensive consolidation in the posterior left upper lobe and left lower lobe. No evidence of pulmonary edema. No pleural effusion and no pneumothorax. Upper Abdomen: No acute abnormality. Musculoskeletal: No chest wall abnormality. No acute or significant osseous findings. Review of the MIP images confirms the above findings. IMPRESSION: 1. No evidence of a pulmonary embolus. 2. Dependent consolidation in both lungs, right greater than left. A significant portion of this is likely due to multifocal pneumonia. Some of this dependent opacity, particularly in the lower lobes, is likely atelectasis. 3. There is no evidence of pulmonary edema.  No pleural effusion. 4. Endotracheal and nasogastric tubes are well position. 5. Mild cardiomegaly. 6. Dilated ascending aorta to 4.2 cm. Recommend annual imaging followup by CTA or MRA. This recommendation follows 2010 ACCF/AHA/AATS/ACR/ASA/SCA/SCAI/SIR/STS/SVM Guidelines for the Diagnosis and Management of Patients with Thoracic Aortic Disease. Circulation. 2010; 121: I097-D532. Aortic aneurysm NOS (ICD10-I71.9) Aortic aneurysm NOS (ICD10-I71.9). Electronically Signed   By: Amie Portland M.D.   On: 03/24/2018 22:44   Dg Chest Port 1 View  Result Date: 03/28/2018 CLINICAL DATA:  Initial evaluation for acute dyspnea. EXAM: PORTABLE CHEST 1 VIEW COMPARISON:  Prior radiograph from 03/26/2018. FINDINGS: Endotracheal tube in place with tip positioned 3.2 cm above the carina. Left subclavian approach CVC in place with tip overlying the distal SVC, stable. Enteric tube courses into the abdomen. Stable cardiomegaly. Mediastinal silhouette within normal limits. Lungs hypoinflated. Veiling opacity overlying the hemidiaphragms compatible with layering effusions, right  greater than  left. Bilateral parenchymal infiltrates again seen involving the lungs bilaterally, right greater than left, perhaps mildly improved at the right upper lung as compared to previous. Underlying mild diffuse pulmonary interstitial congestion. No pneumothorax. Osseous structures unchanged. IMPRESSION: 1. Support apparatus in satisfactory position as above. 2. Veiling opacities overlying the hemidiaphragms, compatible with layering pleural effusions, right worse than left. These appear slightly worsened from previous. 3. Persistent right greater than left parenchymal infiltrates, perhaps mildly improved at the right upper lobe as compared to previous. Electronically Signed   By: Rise Mu M.D.   On: 03/28/2018 05:05   Dg Chest Port 1 View  Result Date: 03/26/2018 CLINICAL DATA:  Intubation.  Respiratory failure. EXAM: PORTABLE CHEST 1 VIEW COMPARISON:  None. FINDINGS: Endotracheal tube, left subclavian line, NG tube in stable position. Bilateral pulmonary infiltrates particularly prominent throughout the right lung again noted. No interim change. Small right pleural effusion can not be excluded. Scratched it small bilateral pleural effusions could not be excluded. No pneumothorax. IMPRESSION: 1.  Lines and tubes in stable position. 2. Bilateral pulmonary infiltrates particularly prominent throughout the right lung again noted. No significant interim change. Small bilateral pleural effusions could not be excluded. Electronically Signed   By: Maisie Fus  Register   On: 03/26/2018 06:46   Dg Chest Port 1 View  Result Date: 03/25/2018 CLINICAL DATA:  Endotracheal intubation EXAM: PORTABLE CHEST 1 VIEW COMPARISON:  Yesterday FINDINGS: Endotracheal tube tip just below the clavicular heads. The orogastric tube at least reaches the stomach. Left subclavian line with tip at the SVC. Extensive lung opacity greater on the right correlating with atelectasis and consolidation by CT. There has been mild  progression since yesterday. No visible pneumothorax. IMPRESSION: 1. Unremarkable hardware positioning. 2. History of aspiration pneumonia with mildly increased opacity. Electronically Signed   By: Marnee Spring M.D.   On: 03/25/2018 06:47   Dg Chest Port 1 View  Result Date: 03/24/2018 CLINICAL DATA:  Status post central line placement. EXAM: PORTABLE CHEST 1 VIEW COMPARISON:  03/24/2018 at 2008 hours FINDINGS: New left subclavian central venous line has its tip projecting in the lower superior vena cava. Endotracheal tube is stable and well positioned. Hazy lung opacities noted previously are not significantly changed, allowing for differences in patient positioning and technique. No pneumothorax. IMPRESSION: 1. Left subclavian central venous line has its tip in the lower superior vena cava. No pneumothorax. 2. No other change from the earlier exam. Electronically Signed   By: Amie Portland M.D.   On: 03/24/2018 22:13   Dg Chest Portable 1 View  Result Date: 03/24/2018 CLINICAL DATA:  Status post CPR, check endotracheal tube placement EXAM: PORTABLE CHEST 1 VIEW COMPARISON:  12/31/2017 FINDINGS: Cardiac shadow is mildly prominent but accentuated by the portable technique. Endotracheal tube is noted approximately 4.5 cm above the carina. Diffuse central vascular congestion is noted as well as increased density throughout the right upper lobe. These changes likely represent mild pulmonary edema. No pneumothorax is seen. No bony abnormality is noted. IMPRESSION: Increased central vascular congestion with some changes suggestive of pulmonary edema particularly in the right upper lobe. Continued follow-up is recommended. Electronically Signed   By: Alcide Clever M.D.   On: 03/24/2018 20:29   Dg Abd Portable 1v  Result Date: 03/29/2018 CLINICAL DATA:  Nasogastric tube placement EXAM: PORTABLE ABDOMEN - 1 VIEW COMPARISON:  Portable exam 0048 hours compared to CT abdomen and pelvis of 12/31/2017 FINDINGS:  Nasogastric tube coiled in mid stomach with tip projecting over  distal antrum. Air-filled loops of large and small bowel in mid abdomen with scattered stool in colon. Catheter versus thermal probe coiled in pelvis. LEFT lower lobe consolidation. Bones unremarkable. IMPRESSION: Nasogastric tube coiled in mid stomach with tip projecting over distal gastric antrum. Electronically Signed   By: Ulyses Southward M.D.   On: 03/29/2018 01:09   Ct Angio Chest/abd/pel For Dissection W And/or W/wo  Result Date: 04/04/2018 CLINICAL DATA:  Cardiac arrest, severe aortic valvular insufficiency and aneurysmal dilatation of the ascending thoracic aorta by prior CTA of the chest timed for pulmonary artery evaluation. EXAM: CT ANGIOGRAPHY CHEST, ABDOMEN AND PELVIS TECHNIQUE: Multidetector CT imaging through the chest, abdomen and pelvis was performed using the standard protocol during bolus administration of intravenous contrast. Multiplanar reconstructed images and MIPs were obtained and reviewed to evaluate the vascular anatomy. CONTRAST:  OMNIPAQUE IOHEXOL 350 MG/ML SOLN COMPARISON:  CTA of the chest on 03/24/2018 FINDINGS: CTA CHEST FINDINGS Cardiovascular: The aortic root is mildly dilated and measures approximately 4.1-4.2 cm at the level of the sinuses of Valsalva. The aortic valve shows mild central calcification. The ascending thoracic aorta is mildly dilated and measures 4.2-4.3 cm in greatest diameter. The proximal arch measures approximately 3.3 cm. The distal arch demonstrates severe tortuosity and focal kinking prior to an area of mild dilatation measuring 3.2 cm. The descending thoracic aorta then tapers to a diameter of 2.8 cm. There is no evidence of aortic dissection. Proximal great vessels demonstrate normal branching anatomy and normal patency without aneurysmal disease. The heart is mildly enlarged. The left ventricle appears dilated. Trace anterior pericardial fluid without significant pericardial effusion.  No significant calcified coronary artery plaque is identified by CT. Although not optimized for pulmonary arterial evaluation, the pulmonary arteries are quite well opacified on the study and there is a new finding of definitive nonocclusive pulmonary embolism in the right lung with nonocclusive thrombus identified at a bifurcation of the right lower lobe pulmonary artery into segmental branches and within a right middle lobe pulmonary artery branch. No left-sided pulmonary embolism is identified. There is no evidence of central PE or saddle embolism. Mediastinum/Nodes: No enlarged mediastinal, hilar, or axillary lymph nodes. Thyroid gland, trachea, and esophagus demonstrate no significant findings. Lungs/Pleura: Mild scarring/atelectasis at both lung bases. There is no evidence of pulmonary edema, consolidation, pneumothorax, nodule or pleural fluid. No evidence of focal pulmonary infarction. Musculoskeletal: No chest wall abnormality. No acute or significant osseous findings. Review of the MIP images confirms the above findings. CTA ABDOMEN AND PELVIS FINDINGS VASCULAR Aorta: The abdominal aorta is normally patent and of normal caliber without evidence of aneurysm or dissection. No significant atherosclerosis. Celiac: Normally patent.  Normal branch vessel anatomy. SMA: Normally patent. Renals: A single right renal artery and 2 separate left renal arteries with small accessory upper pole artery demonstrate normal patency. IMA: Normally patent. Inflow: Bilateral iliac arteries demonstrate normal patency. Common femoral arteries and femoral bifurcations are widely patent. Review of the MIP images confirms the above findings. NON-VASCULAR Hepatobiliary: No focal liver abnormality is seen. No gallstones, gallbladder wall thickening, or biliary dilatation. Pancreas: Unremarkable. No pancreatic ductal dilatation or surrounding inflammatory changes. Spleen: Normal in size without focal abnormality. Adrenals/Urinary  Tract: Adrenal glands are unremarkable. Kidneys are normal, without renal calculi, focal lesion, or hydronephrosis. Bladder is unremarkable. Stomach/Bowel: Bowel shows no evidence of obstruction, ileus or inflammation. No free air identified. Lymphatic: No enlarged lymph nodes identified. Reproductive: Prostate is unremarkable. Other: No hernias identified.  No free fluid  or focal abscess. Musculoskeletal: Mild degenerative disc disease at L4-5 and L5-S1. Review of the MIP images confirms the above findings. IMPRESSION: 1. New, acute subsegmental nonocclusive pulmonary embolism in branches of the right lower lobe and right middle lobe pulmonary arteries. Overall volume of thrombus burden is low. This thrombus was not present on the CTA dated 03/24/2018. 2. Mild aneurysmal disease involving the aortic root and ascending thoracic aorta with the root measuring 4.2 cm in greatest diameter and the ascending thoracic aorta measuring 4.3 cm in greatest diameter. Associated partially calcified aortic valve. The aortic arch is very tortuous distally and demonstrates some degree of focal kinking due to tortuosity without evidence of significant stenosis. No evidence of aortic dissection. 3. Left ventricular cavity dilatation. 4. No evidence of aneurysmal disease or significant obstructive disease involving the abdominal aorta, iliac arteries, common femoral arteries or visceral branches of the abdominal aorta. These results were called by telephone at the time of interpretation on 04/04/2018 at 12:58 pm to Dr. Noralee Stain, who verbally acknowledged these results. Electronically Signed   By: Irish Lack M.D.   On: 04/04/2018 13:10   Vas US Doppler Pre Cabg  Result Date: 04/02/2018 PREOPERATIVE VASCULAR EVALUATION  Indications: Pre cabg. Performing Technologist: Olen Cordial Rvt  Examination Guidelines: A complete evaluation includes B-mode imaging, spectral Doppler, color Doppler, and power Doppler as needed of all  accessible portions of each vessel. Bilateral testing is considered an integral part of a complete examination. Limited examinations for reoccurring indications may be performed as noted.  Right Carotid Findings: +----------+--------+--------+--------+-----------+--------+           PSV cm/sEDV cm/sStenosisDescribe   Comments +----------+--------+--------+--------+-----------+--------+ CCA Prox  91                      homogeneous         +----------+--------+--------+--------+-----------+--------+ CCA Distal63      12              homogeneous         +----------+--------+--------+--------+-----------+--------+ ICA Prox  67      17      1-39%   homogeneous         +----------+--------+--------+--------+-----------+--------+ ICA Distal69      17                                  +----------+--------+--------+--------+-----------+--------+ ECA       106                                         +----------+--------+--------+--------+-----------+--------+ +----------+--------+-------+--------+------------+           PSV cm/sEDV cmsDescribeArm Pressure +----------+--------+-------+--------+------------+ Subclavian118                    119          +----------+--------+-------+--------+------------+ +---------+--------+--+--------+-+---------+ VertebralPSV cm/s51EDV cm/s8Antegrade +---------+--------+--+--------+-+---------+ Left Carotid Findings: +----------+--------+--------+--------+-----------+--------+           PSV cm/sEDV cm/sStenosisDescribe   Comments +----------+--------+--------+--------+-----------+--------+ CCA Prox  87                      homogeneous         +----------+--------+--------+--------+-----------+--------+ CCA Distal93  homogeneous         +----------+--------+--------+--------+-----------+--------+ ICA Prox  68      12      1-39%   homogeneous          +----------+--------+--------+--------+-----------+--------+ ICA Distal37      12                                  +----------+--------+--------+--------+-----------+--------+ ECA       83                                          +----------+--------+--------+--------+-----------+--------+ +----------+--------+--------+--------+------------+ SubclavianPSV cm/sEDV cm/sDescribeArm Pressure +----------+--------+--------+--------+------------+           127                     127          +----------+--------+--------+--------+------------+ +---------+--------+--+--------+--+---------+ VertebralPSV cm/s49EDV cm/s13Antegrade +---------+--------+--+--------+--+---------+  ABI Findings: +--------+------------------+-----+---------+--------+ Right   Rt Pressure (mmHg)IndexWaveform Comment  +--------+------------------+-----+---------+--------+ [id number]                    triphasic         +--------+------------------+-----+---------+--------+ PTA     175               1.38 triphasic         +--------+------------------+-----+---------+--------+ DP      193               1.52 triphasic         +--------+------------------+-----+---------+--------+ +--------+------------------+-----+---------+-------+ Left    Lt Pressure (mmHg)IndexWaveform Comment +--------+------------------+-----+---------+-------+ [id number]                    triphasic        +--------+------------------+-----+---------+-------+ PTA     180               1.42 triphasic        +--------+------------------+-----+---------+-------+ DP      197               1.55 triphasic        +--------+------------------+-----+---------+-------+ +-------+---------------+----------------+ ABI/TBIToday's ABI/TBIPrevious ABI/TBI +-------+---------------+----------------+ Right  1.52                            +-------+---------------+----------------+ Left   1.55                             +-------+---------------+----------------+  Right Doppler Findings: +-----------+--------+-----+---------+-----------------------------------------+ Site       PressureIndexDoppler  Comments                                  +-----------+--------+-----+---------+-----------------------------------------+ Brachial   119          triphasic                                          +-----------+--------+-----+---------+-----------------------------------------+ Radial                  triphasic                                          +-----------+--------+-----+---------+-----------------------------------------+  Ulnar                   triphasic                                          +-----------+--------+-----+---------+-----------------------------------------+ Palmar Arch                      Palmar waveforms are obliterated with                                      radial and ulnar compression.             +-----------+--------+-----+---------+-----------------------------------------+  Left Doppler Findings: +-----------+--------+-----+---------+-----------------------------------------+ Site       PressureIndexDoppler  Comments                                  +-----------+--------+-----+---------+-----------------------------------------+ Brachial   127          triphasic                                          +-----------+--------+-----+---------+-----------------------------------------+ Radial                  triphasic                                          +-----------+--------+-----+---------+-----------------------------------------+ Ulnar                   triphasic                                          +-----------+--------+-----+---------+-----------------------------------------+ Palmar Arch                      Palmar waveforms are obliterated with                                      radial and ulnar  compression.             +-----------+--------+-----+---------+-----------------------------------------+  Summary: Right Carotid: Velocities in the right ICA are consistent with a 1-39% stenosis. Left Carotid: Velocities in the left ICA are consistent with a 1-39% stenosis. Vertebrals: Bilateral vertebral arteries demonstrate antegrade flow. Right ABI: Resting right ankle-brachial index indicates noncompressible right lower extremity arteries. Left ABI: Resting left ankle-brachial index indicates noncompressible left lower extremity arteries.  Electronically signed by Sherald Hess MD on 04/02/2018 at 5:46:33 PM.    Final     Echo 03/25/2018 IMPRESSIONS  1. The left ventricle has mildly reduced systolic function, with an ejection fraction of 45-50%. The cavity size was mildly dilated. Left ventricular diastolic Doppler parameters are consistent with pseudonormalization.  2. The right ventricle has normal systolic function. The cavity was normal. There is no increase in right ventricular wall thickness.  3. Right atrial size was mildly dilated.  4.  Trivial pericardial effusion is present.  5. Mild thickening of the mitral valve leaflet.  6. The aortic valveis bicuspid Aortic valve regurgitation is severe by color flow Doppler.  7. The aortic valve appears to be a bicuspid or functionally bicuspid AV. There is severe AI.     The aortic root is mildly dilated.  8. There is mild dilatation of the ascending aorta.  9.     The aortic valve appears to be a bicuspid or functionally bicuspid AV. There is severe AI.     The aortic root is mildly dilated .     There is concern for a Type A aortic dissection.     Suggest STAT CT angiogram of the chest for further evaluation.         EEG 03/25/2018 EEG Abnormalities: 1) generalized irregular slow activity 2) slow background Clinical Interpretation: This EEG is consistent with a generalized nonspecific cerebral dysfunction (encephalopathy).  Though  nonspecific, this could be seen with sedating medication   There was no seizure or seizure predisposition recorded on this study. Please note that lack of epileptiform activity on EEG does not preclude the possibility of epilepsy.    Cardiac cath 04/02/2018  There is mild left ventricular systolic dysfunction.  LV end diastolic pressure is normal.  The left ventricular ejection fraction is 45-50% by visual estimate.  There is severe (4+) aortic regurgitation.   Normal right heart pressures.  Mild global LV dysfunction with an EF 45 to 50%.  Bicuspid aortic valve with severe 4+ aortic insufficiency, mild aortic stenosis.  Dilated aortic root.  Normal coronary arteries and a left dominant circulation.  RECOMMENDATION: The patient will ultimately need surgical consultation for aortic valve repair/replacement.   Discharge Exam: Vitals:   04/04/18 0605 04/04/18 1005  BP: 132/77 131/76  Pulse: 75 100  Resp: (!) 22 13  Temp: 98.6 F (37 C)   SpO2: 99% 97%     General: Pt is alert, awake, not in acute distress Cardiovascular: RRR, S1/S2 + Respiratory: CTA bilaterally, no wheezing, no rhonchi Abdominal: Soft, NT, ND, bowel sounds + Extremities: no edema, no cyanosis    The results of significant diagnostics from this hospitalization (including imaging, microbiology, ancillary and laboratory) are listed below for reference.     Microbiology: No results found for this or any previous visit (from the past 240 hour(s)).   Labs: BNP (last 3 results) No results for input(s): BNP in the last 8760 hours. Basic Metabolic Panel: Recent Labs  Lab 03/30/18 0344 03/31/18 0302 04/01/18 0305 04/02/18 0243 04/02/18 0859 04/02/18 0900 04/02/18 0930 04/02/18 1048 04/03/18 0248 04/04/18 0308  NA 139 141 140 140 142 142 140  --  138 137  K 3.8 3.8 3.8 3.8 3.7 3.6 3.6  --  3.7 3.6  CL 105 108 106 107  --   --   --   --  109 108  CO2 23 23 24 23   --   --   --   --  23  23  GLUCOSE 105* 104* 85 91  --   --   --   --  88 141*  BUN 24* 22 20 19   --   --   --   --  14 12  CREATININE 1.35* 1.41* 1.40* 1.36*  --   --   --  1.33* 1.28* 1.25*  CALCIUM 9.1 8.9 8.8* 8.7*  --   --   --   --  8.6* 8.6*  MG 2.3  2.3 2.2 2.0  --   --   --   --   --   --   PHOS 3.0  --   --   --   --   --   --   --   --   --    Liver Function Tests: Recent Labs  Lab 03/30/18 0344 03/31/18 0302  AST  --  23  ALT  --  40  ALKPHOS  --  70  BILITOT  --  1.4*  PROT  --  7.2  ALBUMIN 3.0* 2.8*   No results for input(s): LIPASE, AMYLASE in the last 168 hours. No results for input(s): AMMONIA in the last 168 hours. CBC: Recent Labs  Lab 03/30/18 0344 03/31/18 0302 04/01/18 0305 04/02/18 0243 04/02/18 0859 04/02/18 0900 04/02/18 0930 04/02/18 1048 04/03/18 0248  WBC 13.3* 11.9* 11.5* 10.4  --   --   --  9.9 8.8  NEUTROABS 10.4*  --   --   --   --   --   --   --   --   HGB 13.6 13.2 13.9 12.3* 11.9* 12.2* 11.2* 12.8* 12.4*  HCT 40.9 41.3 42.6 38.9* 35.0* 36.0* 33.0* 40.3 38.2*  MCV 83.6 84.3 84.7 84.4  --   --   --  85.0 84.9  PLT 219 242 240 251  --   --   --  276 264   Cardiac Enzymes: No results for input(s): CKTOTAL, CKMB, CKMBINDEX, TROPONINI in the last 168 hours. BNP: Invalid input(s): POCBNP CBG: Recent Labs  Lab 03/31/18 1648 03/31/18 2044 04/01/18 0738 04/01/18 1137 04/01/18 1729  GLUCAP 112* 84 98 100* 97   D-Dimer No results for input(s): DDIMER in the last 72 hours. Hgb A1c No results for input(s): HGBA1C in the last 72 hours. Lipid Profile No results for input(s): CHOL, HDL, LDLCALC, TRIG, CHOLHDL, LDLDIRECT in the last 72 hours. Thyroid function studies No results for input(s): TSH, T4TOTAL, T3FREE, THYROIDAB in the last 72 hours.  Invalid input(s): FREET3 Anemia work up No results for input(s): VITAMINB12, FOLATE, FERRITIN, TIBC, IRON, RETICCTPCT in the last 72 hours. Urinalysis    Component Value Date/Time   COLORURINE STRAW (A)  03/25/2018 0015   APPEARANCEUR CLEAR 03/25/2018 0015   LABSPEC 1.011 03/25/2018 0015   PHURINE 7.0 03/25/2018 0015   GLUCOSEU 50 (A) 03/25/2018 0015   HGBUR MODERATE (A) 03/25/2018 0015   BILIRUBINUR NEGATIVE 03/25/2018 0015   KETONESUR NEGATIVE 03/25/2018 0015   PROTEINUR NEGATIVE 03/25/2018 0015   NITRITE NEGATIVE 03/25/2018 0015   LEUKOCYTESUR NEGATIVE 03/25/2018 0015   Sepsis Labs Invalid input(s): PROCALCITONIN,  WBC,  LACTICIDVEN Microbiology No results found for this or any previous visit (from the past 240 hour(s)).   Patient was seen and examined on the day of discharge and was found to be in stable condition. Time coordinating discharge: 35 minutes including assessment and coordination of care, as well as examination of the patient.   SIGNED:  Noralee Stain, DO [facility] [email or web address] 04/04/2018, 1:17 PM

## 2018-04-04 NOTE — Progress Notes (Signed)
PMR  Chart reviewed , pt had CTA of chest demonstrating acute PE, anticoagulation initiated, cancel rehab admission for today.  Continue to follow along.  Erick Colace M.D. [facility] FAAPM&R (Sports Med, Neuromuscular Med) Diplomate [employer] Board of Electrodiagnostic Med

## 2018-04-05 ENCOUNTER — Other Ambulatory Visit: Payer: Self-pay | Admitting: Physical Medicine & Rehabilitation

## 2018-04-05 ENCOUNTER — Other Ambulatory Visit: Payer: Self-pay

## 2018-04-05 ENCOUNTER — Inpatient Hospital Stay (HOSPITAL_COMMUNITY): Payer: BLUE CROSS/BLUE SHIELD

## 2018-04-05 ENCOUNTER — Encounter (HOSPITAL_COMMUNITY): Payer: Self-pay | Admitting: *Deleted

## 2018-04-05 ENCOUNTER — Inpatient Hospital Stay (HOSPITAL_COMMUNITY)
Admission: RE | Admit: 2018-04-05 | Discharge: 2018-04-10 | DRG: 945 | Disposition: A | Discharge status: Other Acute Inpatient Hospital | Payer: BLUE CROSS/BLUE SHIELD | Source: Intra-hospital | Attending: Physical Medicine & Rehabilitation | Admitting: Physical Medicine & Rehabilitation

## 2018-04-05 DIAGNOSIS — J9601 Acute respiratory failure with hypoxia: Secondary | ICD-10-CM | POA: Diagnosis not present

## 2018-04-05 DIAGNOSIS — Z79891 Long term (current) use of opiate analgesic: Secondary | ICD-10-CM

## 2018-04-05 DIAGNOSIS — I447 Left bundle-branch block, unspecified: Secondary | ICD-10-CM | POA: Diagnosis present

## 2018-04-05 DIAGNOSIS — R5381 Other malaise: Principal | ICD-10-CM | POA: Diagnosis present

## 2018-04-05 DIAGNOSIS — Z8042 Family history of malignant neoplasm of prostate: Secondary | ICD-10-CM

## 2018-04-05 DIAGNOSIS — K5900 Constipation, unspecified: Secondary | ICD-10-CM | POA: Diagnosis present

## 2018-04-05 DIAGNOSIS — F09 Unspecified mental disorder due to known physiological condition: Secondary | ICD-10-CM | POA: Diagnosis not present

## 2018-04-05 DIAGNOSIS — I808 Phlebitis and thrombophlebitis of other sites: Secondary | ICD-10-CM | POA: Diagnosis present

## 2018-04-05 DIAGNOSIS — I350 Nonrheumatic aortic (valve) stenosis: Secondary | ICD-10-CM | POA: Diagnosis not present

## 2018-04-05 DIAGNOSIS — N179 Acute kidney failure, unspecified: Secondary | ICD-10-CM | POA: Diagnosis present

## 2018-04-05 DIAGNOSIS — Z87442 Personal history of urinary calculi: Secondary | ICD-10-CM | POA: Diagnosis not present

## 2018-04-05 DIAGNOSIS — Q231 Congenital insufficiency of aortic valve: Secondary | ICD-10-CM

## 2018-04-05 DIAGNOSIS — Z8674 Personal history of sudden cardiac arrest: Secondary | ICD-10-CM | POA: Diagnosis not present

## 2018-04-05 DIAGNOSIS — Z79899 Other long term (current) drug therapy: Secondary | ICD-10-CM | POA: Diagnosis not present

## 2018-04-05 DIAGNOSIS — J690 Pneumonitis due to inhalation of food and vomit: Secondary | ICD-10-CM | POA: Diagnosis not present

## 2018-04-05 DIAGNOSIS — E876 Hypokalemia: Secondary | ICD-10-CM | POA: Diagnosis not present

## 2018-04-05 DIAGNOSIS — K567 Ileus, unspecified: Secondary | ICD-10-CM | POA: Diagnosis not present

## 2018-04-05 DIAGNOSIS — I2693 Single subsegmental pulmonary embolism without acute cor pulmonale: Secondary | ICD-10-CM | POA: Diagnosis not present

## 2018-04-05 DIAGNOSIS — I472 Ventricular tachycardia: Secondary | ICD-10-CM | POA: Diagnosis not present

## 2018-04-05 DIAGNOSIS — I351 Nonrheumatic aortic (valve) insufficiency: Secondary | ICD-10-CM | POA: Diagnosis not present

## 2018-04-05 DIAGNOSIS — I77810 Thoracic aortic ectasia: Secondary | ICD-10-CM | POA: Diagnosis not present

## 2018-04-05 DIAGNOSIS — I469 Cardiac arrest, cause unspecified: Secondary | ICD-10-CM | POA: Diagnosis not present

## 2018-04-05 DIAGNOSIS — I4901 Ventricular fibrillation: Secondary | ICD-10-CM | POA: Diagnosis present

## 2018-04-05 DIAGNOSIS — I2699 Other pulmonary embolism without acute cor pulmonale: Secondary | ICD-10-CM | POA: Diagnosis present

## 2018-04-05 DIAGNOSIS — K224 Dyskinesia of esophagus: Secondary | ICD-10-CM | POA: Diagnosis not present

## 2018-04-05 DIAGNOSIS — R1013 Epigastric pain: Secondary | ICD-10-CM | POA: Insufficient documentation

## 2018-04-05 HISTORY — DX: Other malaise: R53.81

## 2018-04-05 MED ORDER — TRAZODONE HCL 50 MG PO TABS
25.0000 mg | ORAL_TABLET | Freq: Every evening | ORAL | Status: DC | PRN
  Filled 2018-04-05: qty 1

## 2018-04-05 MED ORDER — SORBITOL 70 % SOLN
30.0000 mL | Freq: Every day | Status: DC | PRN
  Filled 2018-04-05: qty 30

## 2018-04-05 MED ORDER — ENOXAPARIN SODIUM 100 MG/ML ~~LOC~~ SOLN
95.0000 mg | SUBCUTANEOUS | Status: DC
  Administered 2018-04-06 – 2018-04-10 (×5): 95 mg via SUBCUTANEOUS
  Filled 2018-04-05 (×5): qty 0.95

## 2018-04-05 MED ORDER — ACETAMINOPHEN 325 MG PO TABS
325.0000 mg | ORAL_TABLET | ORAL | Status: DC | PRN
  Filled 2018-04-05: qty 2

## 2018-04-05 MED ORDER — ALUM & MAG HYDROXIDE-SIMETH 200-200-20 MG/5ML PO SUSP
30.0000 mL | Freq: Four times a day (QID) | ORAL | Status: DC | PRN
  Administered 2018-04-05: 30 mL via ORAL
  Filled 2018-04-05: qty 30

## 2018-04-05 MED ORDER — SENNOSIDES-DOCUSATE SODIUM 8.6-50 MG PO TABS: 1.0000 | ORAL_TABLET | Freq: Every evening | ORAL | Status: DC | PRN

## 2018-04-05 MED ORDER — INFLUENZA VAC SPLIT QUAD 0.5 ML IM SUSY: 0.5000 mL | PREFILLED_SYRINGE | INTRAMUSCULAR | Status: DC | PRN

## 2018-04-05 NOTE — H&P (Addendum)
Physical Medicine and Rehabilitation Admission H&P        Chief Complaint  Patient presents with  . Cardiac Arrest      HPI:     HPI: Kevin Rojas is a 63 y.o. male with history of LBBB, renal calculi, recent URI symptoms who was admitted from home on 03/24/2018 with V. fib cardiac arrest.  He was treated with CPR x3 minutes by family as well as defibrillation by EMS. He was intubated and treated with cooling protocol. He was started on Unasyn for aspiration PNA. 2D echo showed EF 45-50% with severe AI as well as concerns of type A aortic dissection.  CTA chest negative for PE, dissection or fluid overload.  EKG without acute changes but cardiac enzymes positive due to NSTEMI felt to be due to demand ischemia.  Cardiology recommends cath pending recovery.  CT head negative for acute changes and EEG showed generalized slowing. He tolerated extubation on 03/28/18 and developed ileus with bilious emesis therefore NGT placed. Therapy evaluations revealed debility and CIR recommended due to functional decline.    On 04/04/2018 underwent CT angio of chest to evaluate for dissection.  THere was no evidence of dissection but incidental finding of bilateral subsegmental non-occlusive PE and pt was started on 1.5mg  per kg Enoxaparin q 24h   ROS   Review of Systems  Constitutional: Negative for chills and fever.  HENT: Negative for congestion, hearing loss and nosebleeds.   Eyes: Negative for pain, discharge and redness.  Respiratory: Negative for cough, sputum production, shortness of breath and wheezing.   Cardiovascular: Negative for chest pain, palpitations and leg swelling.  Gastrointestinal: Negative for abdominal pain, heartburn, nausea and vomiting.  Genitourinary: Negative for flank pain.  Musculoskeletal: Negative for back pain, myalgias and neck pain.  Skin: Negative for itching and rash.  Neurological: Positive for weakness. Negative for focal weakness and headaches.   Psychiatric/Behavioral: Negative for hallucinations and memory loss. The patient is not nervous/anxious.      History reviewed. No pertinent past medical history.           Past Surgical History:  Procedure Laterality Date  . AORTIC ARCH ANGIOGRAPHY N/A 04/02/2018    Procedure: AORTIC ARCH ANGIOGRAPHY;  Surgeon: Lennette Bihari, MD;  Location: [facility] INVASIVE CV LAB;  Service: Cardiovascular;  Laterality: N/A;  . RIGHT/LEFT HEART CATH AND CORONARY ANGIOGRAPHY N/A 04/02/2018    Procedure: RIGHT/LEFT HEART CATH AND CORONARY ANGIOGRAPHY;  Surgeon: Lennette Bihari, MD;  Location: [facility] INVASIVE CV LAB;  Service: Cardiovascular;  Laterality: N/A;           Family History  Problem Relation Age of Onset  . Pulmonary embolism Mother    . Prostate cancer Father          passed awary at 9        Social History:  Married. 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 He reports that he has never smoked. He has never used smokeless tobacco. He has history of prior alcohol and drug use.       Allergies: No Known Allergies              Medications Prior to Admission  Medication Sig Dispense Refill  . acetaminophen (TYLENOL) 500 MG tablet Take 1,000 mg by mouth every 6 (six) hours as needed for mild pain.      Marland Kitchen HYDROcodone-acetaminophen (NORCO/VICODIN) 5-325 MG tablet Take 1 tablet by mouth every 6 (six) hours as needed for severe pain. (Patient not  taking: Reported on 03/25/2018) 10 tablet 0  . ondansetron (ZOFRAN ODT) 8 MG disintegrating tablet  ODT q4 hours prn nausea (Patient not taking: Reported on 03/25/2018) 4 tablet 0      Drug Regimen Review  Drug regimen was reviewed and remains appropriate with no significant issues identified   Home: Home Living Family/patient expects to be discharged to:: Private residence Living Arrangements: Spouse/significant other Available Help at Discharge: Family, Available 24 hours/day Type of Home: House Home Access: Stairs to enter Water quality scientist of Steps: 5+2 Entrance Stairs-Rails: Right Home Layout: One level Bathroom Shower/Tub: Engineer, manufacturing systems: Standard Home Equipment: None Additional Comments: has access to a walker  Lives With: Spouse   Functional History: Prior Function Level of Independence: Independent Comments: drives, concrete truck driver   Functional Status:  Mobility: Bed Mobility Overal bed mobility: Modified Independent Bed Mobility: Supine to Sit Supine to sit: Min assist Sit to supine: Mod assist General bed mobility comments: in chai ron arrrival Transfers Overall transfer level: Needs assistance Equipment used: None Transfers: Sit to/from Stand Sit to Stand: Min guard Stand pivot transfers: Min assist General transfer comment: seated on arrival and seated task Ambulation/Gait Ambulation/Gait assistance: Min guard, Min assist Gait Distance (Feet): 150 Feet(X2) Assistive device: None Gait Pattern/deviations: Step-through pattern, Decreased stride length, Decreased dorsiflexion - right, Drifts right/left, Staggering right, Staggering left General Gait Details: Slow, unsteady gait, especially with dynamic gait tasks and with dual tasking. Required multiple cues for appropriate step height as pt with shuffeled gait. Pt only able to recall 1/3 words from word recall task by end of gait training.  Gait velocity: Decreased Gait velocity interpretation: <1.8 ft/sec, indicate of risk for recurrent falls   ADL: ADL Overall ADL's : Needs assistance/impaired Eating/Feeding: Independent Grooming: Wash/dry hands, Wash/dry face, Standing, Supervision/safety Upper Body Bathing: Sitting, Maximal assistance Lower Body Bathing: Maximal assistance, Sit to/from stand, +2 for physical assistance Upper Body Dressing : Moderate assistance, Sitting Lower Body Dressing: Supervision/safety, Sit to/from stand Lower Body Dressing Details (indicate cue type and reason): pajama pants and socks  Toilet Transfer: Ambulation, RW, Min guard Toilet Transfer Details (indicate cue type and reason): Min A for balance.  Toileting- Clothing Manipulation and Hygiene: Minimal assistance, Sit to/from stand Functional mobility during ADLs: Rolling walker, Min guard General ADL Comments: session focused on cognition with pill box test. pt required 17 minutes to complete task. pt unable to recall address with BLESS test but answering all other questions correct.    Cognition: Cognition Overall Cognitive Status: Impaired/Different from baseline Arousal/Alertness: Awake/alert Orientation Level: Oriented to person, Oriented to place, Disoriented to situation Attention: Selective Selective Attention: Impaired Selective Attention Impairment: Verbal complex, Functional complex Memory: Impaired Memory Impairment: Decreased recall of new information, Decreased short term memory(immediate recall 4/5, delayed recall 1/5) Decreased Short Term Memory: Verbal basic, Functional basic Awareness: Impaired Awareness Impairment: Intellectual impairment(somewhat aware of memory deficit) Problem Solving: Impaired Problem Solving Impairment: Verbal basic(slow processing, extended time, 2/3) Executive Function: Organizing, Sequencing Sequencing: Impaired Sequencing Impairment: Music therapist: Impaired Organizing Impairment: Functional basic Safety/Judgment: Impaired Comments: decreased awareness Cognition Arousal/Alertness: Awake/alert Behavior During Therapy: WFL for tasks assessed/performed Overall Cognitive Status: Impaired/Different from baseline Area of Impairment: Attention, Memory, Following commands, Safety/judgement, Awareness, Problem solving Current Attention Level: Selective Memory: Decreased short-term memory Following Commands: Follows one step commands consistently Safety/Judgement: Decreased awareness of deficits, Decreased awareness of safety Awareness: Emergent Problem  Solving: Slow processing, Requires verbal cues General Comments: pt provided a pill box test  and noted to have errors without awarness. pt was able to complete 1 pill a day instructions but when asked to complete multiple steps ( 3 pills per day at meals.) Pt could not figure out when meals occur. pt talking the whole task out loud to therapist and only given cues to finish the task and i will answer your questionsa t the end. Pt is a agreeable to home health RN to help with medication after this task. pt normally helps set up wife pill box.      Blood pressure 127/76, pulse 84, temperature 98.8 F (37.1 C), temperature source Oral, resp. rate (!) 23, height 5\' 10"  (1.778 m), weight 64.9 kg, SpO2 97 %. Physical Exam  General: No acute distress Mood and affect are appropriate Heart: Regular rate and rhythm no rubs murmurs or extra sounds Lungs: Clear to auscultation, breathing unlabored, no rales or wheezes Abdomen: Positive bowel sounds, soft nontender to palpation, nondistended Extremities: No clubbing, cyanosis, or edema Skin: No evidence of breakdown, no evidence of rash Neurologic: Cranial nerves II through XII intact, motor strength is 4/5 in bilateral deltoid, bicep, tricep, grip, hip flexor, knee extensors, ankle dorsiflexor and plantar flexor Sensory exam normal sensation to light touch in bilateral upper and lower extremities Cerebellar exam normal finger to nose to finger as well as heel to shin in bilateral upper and lower extremities Musculoskeletal: Full range of motion in all 4 extremities. No joint swelling   Lab Results Last 48 Hours        Results for orders placed or performed during the hospital encounter of 03/24/18 (from the past 48 hour(s))  Glucose, capillary     Status: None    Collection Time: 04/01/18  5:29 PM  Result Value Ref Range    Glucose-Capillary 97 70 - 99 mg/dL  Basic metabolic panel     Status: Abnormal    Collection Time: 04/02/18  2:43 AM  Result  Value Ref Range    Sodium 140 135 - 145 mmol/L    Potassium 3.8 3.5 - 5.1 mmol/L    Chloride 107 98 - 111 mmol/L    CO2 23 22 - 32 mmol/L    Glucose, Bld 91 70 - 99 mg/dL    BUN 19 8 - 23 mg/dL    Creatinine, Ser 1.61 (H) 0.61 - 1.24 mg/dL    Calcium 8.7 (L) 8.9 - 10.3 mg/dL    GFR calc non Af Amer 55 (L) >60 mL/min    GFR calc Af Amer >60 >60 mL/min    Anion gap 10 5 - 15      Comment: Performed at [facility], [address], [place], [place] [address]  CBC     Status: Abnormal    Collection Time: 04/02/18  2:43 AM  Result Value Ref Range    WBC 10.4 4.0 - 10.5 K/uL    RBC 4.61 4.22 - 5.81 MIL/uL    Hemoglobin 12.3 (L) 13.0 - 17.0 g/dL    HCT 54.0 (L) 98.1 - 52.0 %    MCV 84.4 80.0 - 100.0 fL    MCH 26.7 26.0 - 34.0 pg    MCHC 31.6 30.0 - 36.0 g/dL    RDW 19.1 47.8 - 29.5 %    Platelets 251 150 - 400 K/uL    nRBC 0.0 0.0 - 0.2 %      Comment: Performed at [facility], [address] [place], [place] [address]  Magnesium  Status: None    Collection Time: 04/02/18  2:43 AM  Result Value Ref Range    Magnesium 2.0 1.7 - 2.4 mg/dL      Comment: Performed at [facility], [address], [place], [place] [address]  POCT I-Stat EG7     Status: Abnormal    Collection Time: 04/02/18  8:59 AM  Result Value Ref Range    pH, Ven 7.392 7.250 - 7.430    pCO2, Ven 40.0 (L) 44.0 - 60.0 mmHg    pO2, Ven 39.0 32.0 - 45.0 mmHg    Bicarbonate 24.3 20.0 - 28.0 mmol/L    TCO2 25 22 - 32 mmol/L    O2 Saturation 74.0 %    Acid-base deficit 1.0 0.0 - 2.0 mmol/L    Sodium 142 135 - 145 mmol/L    Potassium 3.7 3.5 - 5.1 mmol/L    Calcium, Ion 1.24 1.15 - 1.40 mmol/L    HCT 35.0 (L) 39.0 - 52.0 %    Hemoglobin 11.9 (L) 13.0 - 17.0 g/dL    Patient temperature HIDE      Sample type VENOUS      Comment NOTIFIED PHYSICIAN    POCT I-Stat EG7     Status: Abnormal    Collection Time: 04/02/18  9:00 AM  Result Value Ref Range    pH, Ven 7.381 7.250 - 7.430    pCO2, Ven  39.2 (L) 44.0 - 60.0 mmHg    pO2, Ven 39.0 32.0 - 45.0 mmHg    Bicarbonate 23.2 20.0 - 28.0 mmol/L    TCO2 24 22 - 32 mmol/L    O2 Saturation 73.0 %    Acid-base deficit 2.0 0.0 - 2.0 mmol/L    Sodium 142 135 - 145 mmol/L    Potassium 3.6 3.5 - 5.1 mmol/L    Calcium, Ion 1.21 1.15 - 1.40 mmol/L    HCT 36.0 (L) 39.0 - 52.0 %    Hemoglobin 12.2 (L) 13.0 - 17.0 g/dL    Patient temperature HIDE      Sample type VENOUS      Comment NOTIFIED PHYSICIAN    I-STAT 7, (LYTES, BLD GAS, ICA, H+H)     Status: Abnormal    Collection Time: 04/02/18  9:30 AM  Result Value Ref Range    pH, Arterial 7.419 7.350 - 7.450    pCO2 arterial 35.1 32.0 - 48.0 mmHg    pO2, Arterial 79.0 (L) 83.0 - 108.0 mmHg    Bicarbonate 22.7 20.0 - 28.0 mmol/L    TCO2 24 22 - 32 mmol/L    O2 Saturation 96.0 %    Acid-base deficit 1.0 0.0 - 2.0 mmol/L    Sodium 140 135 - 145 mmol/L    Potassium 3.6 3.5 - 5.1 mmol/L    Calcium, Ion 1.22 1.15 - 1.40 mmol/L    HCT 33.0 (L) 39.0 - 52.0 %    Hemoglobin 11.2 (L) 13.0 - 17.0 g/dL    Patient temperature HIDE      Sample type ARTERIAL    CBC     Status: Abnormal    Collection Time: 04/02/18 10:48 AM  Result Value Ref Range    WBC 9.9 4.0 - 10.5 K/uL    RBC 4.74 4.22 - 5.81 MIL/uL    Hemoglobin 12.8 (L) 13.0 - 17.0 g/dL    HCT 03.5 59.7 - 41.6 %    MCV 85.0 80.0 - 100.0 fL    MCH 27.0 26.0 - 34.0 pg  MCHC 31.8 30.0 - 36.0 g/dL    RDW 16.1 09.6 - 04.5 %    Platelets 276 150 - 400 K/uL    nRBC 0.0 0.0 - 0.2 %      Comment: Performed at [facility], [address] [place], [place] [address]  Creatinine, serum     Status: Abnormal    Collection Time: 04/02/18 10:48 AM  Result Value Ref Range    Creatinine, Ser 1.33 (H) 0.61 - 1.24 mg/dL    GFR calc non Af Amer 57 (L) >60 mL/min    GFR calc Af Amer >60 >60 mL/min      Comment: Performed at [facility], [address], [place], [place] [address]  CBC     Status: Abnormal    Collection Time: 04/03/18   2:48 AM  Result Value Ref Range    WBC 8.8 4.0 - 10.5 K/uL    RBC 4.50 4.22 - 5.81 MIL/uL    Hemoglobin 12.4 (L) 13.0 - 17.0 g/dL    HCT 82.9 (L) 56.2 - 52.0 %    MCV 84.9 80.0 - 100.0 fL    MCH 27.6 26.0 - 34.0 pg    MCHC 32.5 30.0 - 36.0 g/dL    RDW 13.0 86.5 - 78.4 %    Platelets 264 150 - 400 K/uL    nRBC 0.0 0.0 - 0.2 %      Comment: Performed at [facility], [address], [place], [place] [address]  Basic metabolic panel     Status: Abnormal    Collection Time: 04/03/18  2:48 AM  Result Value Ref Range    Sodium 138 135 - 145 mmol/L    Potassium 3.7 3.5 - 5.1 mmol/L    Chloride 109 98 - 111 mmol/L    CO2 23 22 - 32 mmol/L    Glucose, Bld 88 70 - 99 mg/dL    BUN 14 8 - 23 mg/dL    Creatinine, Ser 5.28 (H) 0.61 - 1.24 mg/dL    Calcium 8.6 (L) 8.9 - 10.3 mg/dL    GFR calc non Af Amer 60 (L) >60 mL/min    GFR calc Af Amer >60 >60 mL/min    Anion gap 6 5 - 15      Comment: Performed at [facility], [address], [place], [place] [address]       Imaging Results (Last 48 hours)  Vas US Doppler Pre Cabg   Result Date: 04/02/2018 PREOPERATIVE VASCULAR EVALUATION  Indications: Pre cabg. Performing Technologist: Olen Cordial Rvt  Examination Guidelines: A complete evaluation includes B-mode imaging, spectral Doppler, color Doppler, and power Doppler as needed of all accessible portions of each vessel. Bilateral testing is considered an integral part of a complete examination. Limited examinations for reoccurring indications may be performed as noted.  Right Carotid Findings: +----------+--------+--------+--------+-----------+--------+           PSV cm/sEDV cm/sStenosisDescribe   Comments +----------+--------+--------+--------+-----------+--------+ CCA Prox  91                      homogeneous         +----------+--------+--------+--------+-----------+--------+ CCA Distal63      12              homogeneous          +----------+--------+--------+--------+-----------+--------+ ICA Prox  67      17      1-39%   homogeneous         +----------+--------+--------+--------+-----------+--------+  ICA Distal69      17                                  +----------+--------+--------+--------+-----------+--------+ ECA       106                                         +----------+--------+--------+--------+-----------+--------+ +----------+--------+-------+--------+------------+           PSV cm/sEDV cmsDescribeArm Pressure +----------+--------+-------+--------+------------+ Subclavian118                    119          +----------+--------+-------+--------+------------+ +---------+--------+--+--------+-+---------+ VertebralPSV cm/s51EDV cm/s8Antegrade +---------+--------+--+--------+-+---------+ Left Carotid Findings: +----------+--------+--------+--------+-----------+--------+           PSV cm/sEDV cm/sStenosisDescribe   Comments +----------+--------+--------+--------+-----------+--------+ CCA Prox  87                      homogeneous         +----------+--------+--------+--------+-----------+--------+ CCA Distal93                      homogeneous         +----------+--------+--------+--------+-----------+--------+ ICA Prox  68      12      1-39%   homogeneous         +----------+--------+--------+--------+-----------+--------+ ICA Distal37      12                                  +----------+--------+--------+--------+-----------+--------+ ECA       83                                          +----------+--------+--------+--------+-----------+--------+ +----------+--------+--------+--------+------------+ SubclavianPSV cm/sEDV cm/sDescribeArm Pressure +----------+--------+--------+--------+------------+           127                     127          +----------+--------+--------+--------+------------+ +---------+--------+--+--------+--+---------+  VertebralPSV cm/s49EDV cm/s13Antegrade +---------+--------+--+--------+--+---------+  ABI Findings: +--------+------------------+-----+---------+--------+ Right   Rt Pressure (mmHg)IndexWaveform Comment  +--------+------------------+-----+---------+--------+ [id number]                    triphasic         +--------+------------------+-----+---------+--------+ PTA     175               1.38 triphasic         +--------+------------------+-----+---------+--------+ DP      193               1.52 triphasic         +--------+------------------+-----+---------+--------+ +--------+------------------+-----+---------+-------+ Left    Lt Pressure (mmHg)IndexWaveform Comment +--------+------------------+-----+---------+-------+ [id number]                    triphasic        +--------+------------------+-----+---------+-------+ PTA     180               1.42 triphasic        +--------+------------------+-----+---------+-------+ DP      197  1.55 triphasic        +--------+------------------+-----+---------+-------+ +-------+---------------+----------------+ ABI/TBIToday's ABI/TBIPrevious ABI/TBI +-------+---------------+----------------+ Right  1.52                            +-------+---------------+----------------+ Left   1.55                            +-------+---------------+----------------+  Right Doppler Findings: +-----------+--------+-----+---------+-----------------------------------------+ Site       PressureIndexDoppler  Comments                                  +-----------+--------+-----+---------+-----------------------------------------+ Brachial   119          triphasic                                          +-----------+--------+-----+---------+-----------------------------------------+ Radial                  triphasic                                           +-----------+--------+-----+---------+-----------------------------------------+ Ulnar                   triphasic                                          +-----------+--------+-----+---------+-----------------------------------------+ Palmar Arch                      Palmar waveforms are obliterated with                                      radial and ulnar compression.             +-----------+--------+-----+---------+-----------------------------------------+  Left Doppler Findings: +-----------+--------+-----+---------+-----------------------------------------+ Site       PressureIndexDoppler  Comments                                  +-----------+--------+-----+---------+-----------------------------------------+ Brachial   127          triphasic                                          +-----------+--------+-----+---------+-----------------------------------------+ Radial                  triphasic                                          +-----------+--------+-----+---------+-----------------------------------------+ Ulnar                   triphasic                                          +-----------+--------+-----+---------+-----------------------------------------+  Palmar Arch                      Palmar waveforms are obliterated with                                      radial and ulnar compression.             +-----------+--------+-----+---------+-----------------------------------------+  Summary: Right Carotid: Velocities in the right ICA are consistent with a 1-39% stenosis. Left Carotid: Velocities in the left ICA are consistent with a 1-39% stenosis. Vertebrals: Bilateral vertebral arteries demonstrate antegrade flow. Right ABI: Resting right ankle-brachial index indicates noncompressible right lower extremity arteries. Left ABI: Resting left ankle-brachial index indicates noncompressible left lower extremity arteries.  Electronically signed  by Sherald Hess MD on 04/02/2018 at 5:46:33 PM.    Final              Medical Problem List and Plan: 1.  Debility secondary to v fib cardiac arrest  2.  Antithrombotics: -DVT/anticoagulation:  Pharmaceutical: Lovenox             -antiplatelet therapy: none 3. Pain Management: Acetaminophen 650mg  Q 6 h prn 4. Mood:              -antipsychotic agents: monitor for depression/adjustment issues, pt has strong religious faith 5. Neuropsych: This patient is capable of making decisions on his own behalf. 6. Skin/Wound Care: monitor for skin breakdown related to immobility and prolonged bedrest 7. Fluids/Electrolytes/Nutrition: monitor Is and Os, HDPD diet, check BMET in am         Post Admission Physician Evaluation: 1. Functional deficits secondary  to Debility. 2. Patient admitted to receive collaborative, interdisciplinary care between the physiatrist, rehab nursing staff, and therapy team. 3. Patient's level of medical complexity and substantial therapy needs in context of that medical necessity cannot be provided at a lesser intensity of care. 4. Patient has experienced substantial functional loss from his/her baseline.  Judging by the patient's diagnosis, physical exam, and functional history, the patient has potential for functional progress which will result in measurable gains while on inpatient rehab.  These gains will be of substantial and practical use upon discharge in facilitating mobility and self-care at the household level. 5. Physiatrist will provide 24 hour management of medical needs as well as oversight of the therapy plan/treatment and provide guidance as appropriate regarding the interaction of the two. 6. 24 hour rehab nursing will assist in the management of  bladder management, bowel management, safety, skin/wound care, disease management, medication administration, pain management and patient education  and help integrate therapy concepts, techniques,education, etc.  7. PT will assess and treat for:pre gait, gait training, endurance , safety, equipment, neuromuscular re education  .  Goals are: supervision. 8. OT will assess and treat for ADLs, Cognitive perceptual skills, Neuromuscular re education, safety, endurance, equipment  .  Goals are: supervision.  9. SLP will assess and treat for   .  Goals are: N/A. 10. Case Management and Social Worker will assess and treat for psychological issues and discharge planning. 11. Team conference will be held weekly to assess progress toward goals and to determine barriers to discharge. 12.  Patient will receive at least 3 hours of therapy per day at least 5 days per week. 13. ELOS and Prognosis: 7d excellent  "I have personally performed a face to face diagnostic evaluation of this  patient.  Additionally, I have reviewed and concur with the physician assistant's documentation above."   Erick Colace M.D. [facility] FAAPM&R (Sports Med, Neuromuscular Med) Diplomate Am Board of Electrodiagnostic Med  Jacquelynn Cree, PA-C 04/05/2018

## 2018-04-05 NOTE — Plan of Care (Signed)
?  Problem: Clinical Measurements: ?Goal: Will remain free from infection ?Outcome: Progressing ?  ?

## 2018-04-05 NOTE — Progress Notes (Signed)
Patient transferred from 6E with all personal belongings. Patient oriented to room and call light in place. Lorri Frederick, LPN

## 2018-04-05 NOTE — Progress Notes (Signed)
PROGRESS NOTE    Kevin Rojas  EZM:[id number] DOB: May 23, 1955 DOA: 03/24/2018 PCP: [name], MD     Brief Narrative:  Kevin Rojas a 63 year old male with past medical history significant for nephrolithiasis. Patient presented from home with Vfib cardiac arrest on 3/17. CPR started by wife. Found in Vfib shocked twice and king airway placed. Vomited around [facility] airway. Intubated and stable in ER but not purposeful. Hypothermia protocol started. EKG noted for LBBB. PCCMadmitted the patient. Patient has been extubated. Patient was transferred to the hospitalist team on 03/29/2018. Problems include encephalopathy that is resolving, possible aspiration pneumonia, decreased ejection fraction that could be related to the V. fib arrest and cardioversion, acute kidney injury that is resolving, likely bicuspid aortic valve with severe regurgitation and elevated troponin likely secondary to above.He underwent heart cath 3/26 which revealed severe aortic regurgitation with absence of underlying coronary disease.He has been evaluated by cardiology, cardiothoracic surgery, EP.   New events last 24 hours / Subjective: No complaints today, denies any chest pain or shortness of breath.  He is sitting in chair on room air.  Denies any nausea, vomiting or abdominal pain.  Assessment & Plan:   Principal Problem:   Cardiac arrest [facility]) Active Problems:   LBBB (left bundle branch block)   Elevated troponin   Hypokalemia   Endotracheally intubated   Abnormal CXR   Aspiration pneumonia (HCC)   Severe aortic insufficiency   Acute respiratory failure with hypoxia (HCC)   Aspiration into airway   V. fib arrest Bicuspid aortic valve with severe regurgitation Newly diagnosed acute combined systolic and diastolic congestive heart failure Chronic LBBB Status post intubation and cooling protocol S/p heart cath 3/26 Cardiology, Cardiothoracic surgery, EP following. Plan for AVR and ICD  after CIR   Right subsegmental nonocclusive PE CTA C/A/P was completed as work up for AVR. Incidentally found PE. Spoke with radiologist, low burden clot, this was not present on CTA chest on 3/17. Started on lovenox. Patient may continue this at [facility] and hold pending surgical procedures. Patient has no hypoxemia, LE edema, pleuritic chest pain, or dyspnea on exam.   Anoxic encephalopathy Resolved, back to baseline   Coronavirus NL63 URI - NOT SARS-CoV2 Improved with supportive care   Possible aspiration pneumonia  Completed unasyn 7 days   AKI Due to cardiogenic shock in setting of V fib Cr stable    DVT prophylaxis: Lovenox  Code Status: Full Family Communication: No family at bedside  Disposition Plan: CIR 3/30 hopefully    Consultants:   PCCM admit  Cardiology   Cardiothoracic surgery  EP    Antimicrobials:  Anti-infectives (From admission, onward)   Start     Dose/Rate Route Frequency Ordered Stop   03/31/18 1145  Ampicillin-Sulbactam (UNASYN) 3 g in sodium chloride 0.9 % 100 mL IVPB  Status:  Discontinued     3 g 200 mL/hr over 30 Minutes Intravenous Every 6 hours 03/31/18 1143 03/31/18 1143   03/29/18 1600  Ampicillin-Sulbactam (UNASYN) 3 g in sodium chloride 0.9 % 100 mL IVPB     3 g 200 mL/hr over 30 Minutes Intravenous Every 6 hours 03/29/18 1508 03/30/18 2339   03/25/18 0800  Ampicillin-Sulbactam (UNASYN) 3 g in sodium chloride 0.9 % 100 mL IVPB     3 g 200 mL/hr over 30 Minutes Intravenous Every 6 hours 03/25/18 0721 03/28/18 2107   03/24/18 2300  Ampicillin-Sulbactam (UNASYN) 3 g in sodium chloride 0.9 % 100 mL IVPB  Status:  Discontinued  3 g 200 mL/hr over 30 Minutes Intravenous Every 6 hours 03/24/18 2211 03/25/18 0721   03/24/18 2030  cefTRIAXone (ROCEPHIN) 1 g in sodium chloride 0.9 % 100 mL IVPB  Status:  Discontinued     1 g 200 mL/hr over 30 Minutes Intravenous Every 24 hours 03/24/18 2025 03/24/18 2208   03/24/18 2030  azithromycin  (ZITHROMAX) 500 mg in sodium chloride 0.9 % 250 mL IVPB  Status:  Discontinued     500 mg 250 mL/hr over 60 Minutes Intravenous Every 24 hours 03/24/18 2025 03/25/18 1003       Objective: Vitals:   04/04/18 1005 04/04/18 1438 04/04/18 2154 04/05/18 0637  BP: 131/76 (!) 156/86 (!) 143/97 124/76  Pulse: 100 93 95 74  Resp: Temp:  98.6 F (37 C) 98.5 F (36.9 C) 99.7 F (37.6 C)  TempSrc:  Oral Oral Oral  SpO2: 97% 97% 100% 99%  Weight:    62.7 kg  Height:        Intake/Output Summary (Last 24 hours) at 04/05/2018 1033 Last data filed at 04/05/2018 0500 Gross per 24 hour  Intake 223 ml  Output 600 ml  Net -377 ml   Filed Weights   04/03/18 0629 04/04/18 0605 04/05/18 0637  Weight: 64.9 kg 64.7 kg 62.7 kg    Examination: General exam: Appears calm and comfortable  Respiratory system: Clear to auscultation. Respiratory effort normal. Cardiovascular system: S1 & S2 heard, RRR. No JVD, murmurs, rubs, gallops or clicks. No pedal edema. Gastrointestinal system: Abdomen is nondistended, soft and nontender. No organomegaly or masses felt. Normal bowel sounds heard. Central nervous system: Alert and oriented. No focal neurological deficits. Extremities: Symmetric 5 x 5 power. Skin: No rashes, lesions or ulcers Psychiatry: Judgement and insight appear normal. Mood & affect appropriate.   Data Reviewed: I have personally reviewed following labs and imaging studies  CBC: Recent Labs  Lab 03/30/18 0344 03/31/18 0302 04/01/18 0305 04/02/18 0243 04/02/18 0859 04/02/18 0900 04/02/18 0930 04/02/18 1048 04/03/18 0248  WBC 13.3* 11.9* 11.5* 10.4  --   --   --  9.9 8.8  NEUTROABS 10.4*  --   --   --   --   --   --   --   --   HGB 13.6 13.2 13.9 12.3* 11.9* 12.2* 11.2* 12.8* 12.4*  HCT 40.9 41.3 42.6 38.9* 35.0* 36.0* 33.0* 40.3 38.2*  MCV 83.6 84.3 84.7 84.4  --   --   --  85.0 84.9  PLT 219 242 240 251  --   --   --  276 264   Basic Metabolic Panel: Recent Labs   Lab 03/30/18 0344 03/31/18 0302 04/01/18 0305 04/02/18 0243 04/02/18 0859 04/02/18 0900 04/02/18 0930 04/02/18 1048 04/03/18 0248 04/04/18 0308  NA 139 141 140 140 142 142 140  --  138 137  K 3.8 3.8 3.8 3.8 3.7 3.6 3.6  --  3.7 3.6  CL 105 108 106 107  --   --   --   --  109 108  CO2 --   --   --   --  23 23  GLUCOSE 105* 104* 85 91  --   --   --   --  88 141*  BUN 24* --   --   --   --  14 12  CREATININE 1.35* 1.41* 1.40* 1.36*  --   --   --  1.33* 1.28* 1.25*  CALCIUM 9.1 8.9 8.8* 8.7*  --   --   --   --  8.6* 8.6*  MG 2.3 2.3 2.2 2.0  --   --   --   --   --   --   PHOS 3.0  --   --   --   --   --   --   --   --   --    GFR: Estimated Creatinine Clearance: 54.3 mL/min (A) (by C-G formula based on SCr of 1.25 mg/dL (H)). Liver Function Tests: Recent Labs  Lab 03/30/18 0344 03/31/18 0302  AST  --  23  ALT  --  40  ALKPHOS  --  70  BILITOT  --  1.4*  PROT  --  7.2  ALBUMIN 3.0* 2.8*   No results for input(s): LIPASE, AMYLASE in the last 168 hours. No results for input(s): AMMONIA in the last 168 hours. Coagulation Profile: No results for input(s): INR, PROTIME in the last 168 hours. Cardiac Enzymes: No results for input(s): CKTOTAL, CKMB, CKMBINDEX, TROPONINI in the last 168 hours. BNP (last 3 results) No results for input(s): PROBNP in the last 8760 hours. HbA1C: No results for input(s): HGBA1C in the last 72 hours. CBG: Recent Labs  Lab 03/31/18 1648 03/31/18 2044 04/01/18 0738 04/01/18 1137 04/01/18 1729  GLUCAP 112* 84 98 100* 97   Lipid Profile: No results for input(s): CHOL, HDL, LDLCALC, TRIG, CHOLHDL, LDLDIRECT in the last 72 hours. Thyroid Function Tests: No results for input(s): TSH, T4TOTAL, FREET4, T3FREE, THYROIDAB in the last 72 hours. Anemia Panel: No results for input(s): VITAMINB12, FOLATE, FERRITIN, TIBC, IRON, RETICCTPCT in the last 72 hours. Sepsis Labs: No results for input(s): PROCALCITON, LATICACIDVEN in  the last 168 hours.  No results found for this or any previous visit (from the past 240 hour(s)).     Radiology Studies: Ct Angio Chest/abd/pel For Dissection W And/or W/wo  Result Date: 04/04/2018 CLINICAL DATA:  Cardiac arrest, severe aortic valvular insufficiency and aneurysmal dilatation of the ascending thoracic aorta by prior CTA of the chest timed for pulmonary artery evaluation. EXAM: CT ANGIOGRAPHY CHEST, ABDOMEN AND PELVIS TECHNIQUE: Multidetector CT imaging through the chest, abdomen and pelvis was performed using the standard protocol during bolus administration of intravenous contrast. Multiplanar reconstructed images and MIPs were obtained and reviewed to evaluate the vascular anatomy. CONTRAST:  OMNIPAQUE IOHEXOL 350 MG/ML SOLN COMPARISON:  CTA of the chest on 03/24/2018 FINDINGS: CTA CHEST FINDINGS Cardiovascular: The aortic root is mildly dilated and measures approximately 4.1-4.2 cm at the level of the sinuses of Valsalva. The aortic valve shows mild central calcification. The ascending thoracic aorta is mildly dilated and measures 4.2-4.3 cm in greatest diameter. The proximal arch measures approximately 3.3 cm. The distal arch demonstrates severe tortuosity and focal kinking prior to an area of mild dilatation measuring 3.2 cm. The descending thoracic aorta then tapers to a diameter of 2.8 cm. There is no evidence of aortic dissection. Proximal great vessels demonstrate normal branching anatomy and normal patency without aneurysmal disease. The heart is mildly enlarged. The left ventricle appears dilated. Trace anterior pericardial fluid without significant pericardial effusion. No significant calcified coronary artery plaque is identified by CT. Although not optimized for pulmonary arterial evaluation, the pulmonary arteries are quite well opacified on the study and there is a new finding of definitive nonocclusive pulmonary embolism in the right lung with nonocclusive thrombus  identified at a bifurcation of the right  lower lobe pulmonary artery into segmental branches and within a right middle lobe pulmonary artery branch. No left-sided pulmonary embolism is identified. There is no evidence of central PE or saddle embolism. Mediastinum/Nodes: No enlarged mediastinal, hilar, or axillary lymph nodes. Thyroid gland, trachea, and esophagus demonstrate no significant findings. Lungs/Pleura: Mild scarring/atelectasis at both lung bases. There is no evidence of pulmonary edema, consolidation, pneumothorax, nodule or pleural fluid. No evidence of focal pulmonary infarction. Musculoskeletal: No chest wall abnormality. No acute or significant osseous findings. Review of the MIP images confirms the above findings. CTA ABDOMEN AND PELVIS FINDINGS VASCULAR Aorta: The abdominal aorta is normally patent and of normal caliber without evidence of aneurysm or dissection. No significant atherosclerosis. Celiac: Normally patent.  Normal branch vessel anatomy. SMA: Normally patent. Renals: A single right renal artery and 2 separate left renal arteries with small accessory upper pole artery demonstrate normal patency. IMA: Normally patent. Inflow: Bilateral iliac arteries demonstrate normal patency. Common femoral arteries and femoral bifurcations are widely patent. Review of the MIP images confirms the above findings. NON-VASCULAR Hepatobiliary: No focal liver abnormality is seen. No gallstones, gallbladder wall thickening, or biliary dilatation. Pancreas: Unremarkable. No pancreatic ductal dilatation or surrounding inflammatory changes. Spleen: Normal in size without focal abnormality. Adrenals/Urinary Tract: Adrenal glands are unremarkable. Kidneys are normal, without renal calculi, focal lesion, or hydronephrosis. Bladder is unremarkable. Stomach/Bowel: Bowel shows no evidence of obstruction, ileus or inflammation. No free air identified. Lymphatic: No enlarged lymph nodes identified. Reproductive:  Prostate is unremarkable. Other: No hernias identified.  No free fluid or focal abscess. Musculoskeletal: Mild degenerative disc disease at L4-5 and L5-S1. Review of the MIP images confirms the above findings. IMPRESSION: 1. New, acute subsegmental nonocclusive pulmonary embolism in branches of the right lower lobe and right middle lobe pulmonary arteries. Overall volume of thrombus burden is low. This thrombus was not present on the CTA dated 03/24/2018. 2. Mild aneurysmal disease involving the aortic root and ascending thoracic aorta with the root measuring 4.2 cm in greatest diameter and the ascending thoracic aorta measuring 4.3 cm in greatest diameter. Associated partially calcified aortic valve. The aortic arch is very tortuous distally and demonstrates some degree of focal kinking due to tortuosity without evidence of significant stenosis. No evidence of aortic dissection. 3. Left ventricular cavity dilatation. 4. No evidence of aneurysmal disease or significant obstructive disease involving the abdominal aorta, iliac arteries, common femoral arteries or visceral branches of the abdominal aorta. These results were called by telephone at the time of interpretation on 04/04/2018 at 12:58 pm to Dr. Noralee Stain, who verbally acknowledged these results. Electronically Signed   By: Irish Lack M.D.   On: 04/04/2018 13:10      Scheduled Meds:  chlorhexidine  15 mL Mouth Rinse BID   enoxaparin (LOVENOX) injection  1.5 mg/kg Subcutaneous Q24H   feeding supplement (ENSURE ENLIVE)  237 mL Oral BID BM   mouth rinse  15 mL Mouth Rinse q12n4p   pantoprazole  40 mg Oral Q1200   sodium chloride flush  3 mL Intravenous Q12H   Continuous Infusions:  sodium chloride Stopped (04/02/18 1800)     LOS: 12 days    Time spent: 25 minutes   Noralee Stain, DO [facility] [email or web address] 04/05/2018, 10:33 AM

## 2018-04-05 NOTE — Progress Notes (Signed)
Ranelle Oyster, MD  Physician  Physical Medicine and Rehabilitation  Consult Note  Signed  Date of Service:  03/30/2018 10:06 AM       Related encounter: ED to Hosp-Admission (Discharged) from 03/24/2018 in [facility]      Signed      Expand All Collapse All    Show:Clear all Manual[x] Template[] Copied  Added by: Love, Evlyn Kanner, PA-C[x] Ranelle Oyster, MD  Hover for details      Physical Medicine and Rehabilitation Consult   Reason for Consult: Debility Referring Physician: Dr. Sharon Seller   HPI: Kevin Rojas is a 63 y.o. male with history of LBBB, renal calculi, recent URI symptoms who was admitted from home on 03/24/2018 with V. fib cardiac arrest.  He was treated with CPR x3 minutes by family as well as defibrillation by EMS. He was intubated and treated with cooling protocol. He was started on Unasyn for aspiration PNA. 2D echo showed EF 45-50% with severe AI as well as concerns of type A aortic dissection.  CTA chest negative for PE, dissection or fluid overload.  EKG without acute changes but cardiac enzymes positive due to NSTEMI felt to be due to demand ischemia.  Cardiology recommends cath pending recovery.  CT head negative for acute changes and EEG showed generalized slowing. He tolerated extubation on 03/28/18 and developed ileus with bilious emesis therefore NGT placed. Therapy evaluations revealed debility and CIR recommended due to functional decline.    Review of Systems  Constitutional: Negative for chills and fever.  HENT: Negative for hearing loss and tinnitus.   Eyes: Negative for blurred vision and double vision.  Respiratory: Negative for cough and shortness of breath.   Cardiovascular: Negative for chest pain and palpitations.  Gastrointestinal: Positive for heartburn and nausea. Negative for abdominal pain.  Genitourinary: Negative for dysuria and urgency.  Musculoskeletal: Negative for joint pain and  myalgias.  Skin: Negative for rash.     History reviewed. No pertinent past medical history.    History reviewed. No pertinent surgical history.         Family History  Problem Relation Age of Onset  . Pulmonary embolism Mother   . Prostate cancer Father        passed awary at 22      Social History:  Married. Works as a Programmer, multimedia and drives a Writer. He reports that he has never smoked. He has never used smokeless tobacco. He reports previous alcohol use. He reports previous drug use.    Allergies: No Known Allergies          Medications Prior to Admission  Medication Sig Dispense Refill  . acetaminophen (TYLENOL) 500 MG tablet Take 1,000 mg by mouth every 6 (six) hours as needed for mild pain.    Marland Kitchen HYDROcodone-acetaminophen (NORCO/VICODIN) 5-325 MG tablet Take 1 tablet by mouth every 6 (six) hours as needed for severe pain. (Patient not taking: Reported on 03/25/2018) 10 tablet 0  . ondansetron (ZOFRAN ODT) 8 MG disintegrating tablet  ODT q4 hours prn nausea (Patient not taking: Reported on 03/25/2018) 4 tablet 0    Home: Home Living Family/patient expects to be discharged to:: Private residence Living Arrangements: Spouse/significant other Available Help at Discharge: Family, Available 24 hours/day Type of Home: House Home Access: Stairs to enter [employer] of Steps: 5+2 Entrance Stairs-Rails: Right Home Layout: One level Bathroom Shower/Tub: Engineer, manufacturing systems: Standard Home Equipment: None Additional Comments: has access to a walker  Functional History:  Prior Function Level of Independence: Independent Comments: drives, concrete truck driver Functional Status:  Mobility: Bed Mobility Overal bed mobility: Needs Assistance Bed Mobility: Supine to Sit, Sit to Supine Supine to sit: Min assist Sit to supine: Mod assist General bed mobility comments: increased time, min assist to position hips at EOB, assist for  LEs back into bed  Transfers Overall transfer level: Needs assistance Equipment used: 2 person hand held assist Transfers: Sit to/from Stand Sit to Stand: +2 physical assistance, Min assist General transfer comment: assist to rise and stedy, pt able to take several side steps toward HOB, flexed posture Ambulation/Gait Ambulation/Gait assistance: Min assist, +2 physical assistance Gait Distance (Feet): 3 Feet General Gait Details: side steps to [facility]  ADL: ADL Overall ADL's : Needs assistance/impaired Eating/Feeding: NPO Grooming: Minimal assistance, Sitting Upper Body Bathing: Sitting, Maximal assistance Lower Body Bathing: Maximal assistance, Sit to/from stand, +2 for physical assistance Upper Body Dressing : Moderate assistance, Sitting Lower Body Dressing: +2 for physical assistance, Sit to/from stand, Maximal assistance Toilet Transfer: +2 for physical assistance, Minimal assistance Toileting- Clothing Manipulation and Hygiene: +2 for physical assistance, Total assistance, Sit to/from stand  Cognition: Cognition Overall Cognitive Status: Within Functional Limits for tasks assessed Orientation Level: Oriented to person, Oriented to place Cognition Arousal/Alertness: Awake/alert Behavior During Therapy: Flat affect Overall Cognitive Status: Within Functional Limits for tasks assessed General Comments: WFL for tasks assessed this visit, needs ongoing assessment, minimally verbal during session   Blood pressure 136/78, pulse 81, temperature 99.7 F (37.6 C), temperature source Oral, resp. rate 19, height 5\' 10"  (1.778 m), weight 66.9 kg, SpO2 93 %. Physical Exam  Nursing note and vitals reviewed. Constitutional: He is oriented to person, place, and time. He appears well-developed and well-nourished.  Frail appearing  HENT:  Head: Normocephalic and atraumatic.  NGT  Eyes: Pupils are equal, round, and reactive to light.  Neck: No thyromegaly present.  Cardiovascular:  Normal rate.  Respiratory: Effort normal. No stridor. No respiratory distress. He has no wheezes.  Sl dyspnea with conversation  GI: Soft.     Neurological: He is alert and oriented to person, place, and time.  Pt sitting in chair, has difficulty with head control. UE motor 3+ to 4/5 prox to distal. LE: 3/5 prox to 4- distal. No focal sensory deficits. Displays good insight and awareness. Speech clear.       Skin: Skin is warm.  Psychiatric: He has a normal mood and affect. His behavior is normal.    LabResultsLast24Hours       Results for orders placed or performed during the hospital encounter of 03/24/18 (from the past 24 hour(s))  Glucose, capillary     Status: Abnormal   Collection Time: 03/29/18 11:53 AM  Result Value Ref Range   Glucose-Capillary 104 (H) 70 - 99 mg/dL  Glucose, capillary     Status: Abnormal   Collection Time: 03/29/18  3:26 PM  Result Value Ref Range   Glucose-Capillary 108 (H) 70 - 99 mg/dL  Glucose, capillary     Status: Abnormal   Collection Time: 03/30/18 12:15 AM  Result Value Ref Range   Glucose-Capillary 104 (H) 70 - 99 mg/dL  Renal function panel     Status: Abnormal   Collection Time: 03/30/18  3:44 AM  Result Value Ref Range   Sodium 139 135 - 145 mmol/L   Potassium 3.8 3.5 - 5.1 mmol/L   Chloride 105 98 - 111 mmol/L   CO2 23 22 - 32 mmol/L  Glucose, Bld 105 (H) 70 - 99 mg/dL   BUN 24 (H) 8 - 23 mg/dL   Creatinine, Ser 5.79 (H) 0.61 - 1.24 mg/dL   Calcium 9.1 8.9 - 03.8 mg/dL   Phosphorus 3.0 2.5 - 4.6 mg/dL   Albumin 3.0 (L) 3.5 - 5.0 g/dL   GFR calc non Af Amer 56 (L) >60 mL/min   GFR calc Af Amer >60 >60 mL/min   Anion gap 11 5 - 15  Magnesium     Status: None   Collection Time: 03/30/18  3:44 AM  Result Value Ref Range   Magnesium 2.3 1.7 - 2.4 mg/dL  CBC with Differential/Platelet     Status: Abnormal   Collection Time: 03/30/18  3:44 AM  Result Value Ref Range   WBC 13.3 (H) 4.0 - 10.5 K/uL    RBC 4.89 4.22 - 5.81 MIL/uL   Hemoglobin 13.6 13.0 - 17.0 g/dL   HCT 33.3 83.2 - 91.9 %   MCV 83.6 80.0 - 100.0 fL   MCH 27.8 26.0 - 34.0 pg   MCHC 33.3 30.0 - 36.0 g/dL   RDW 16.6 06.0 - 04.5 %   Platelets 219 150 - 400 K/uL   nRBC 0.0 0.0 - 0.2 %   Neutrophils Relative % 78 %   Neutro Abs 10.4 (H) 1.7 - 7.7 K/uL   Lymphocytes Relative 11 %   Lymphs Abs 1.4 0.7 - 4.0 K/uL   Monocytes Relative 10 %   Monocytes Absolute 1.3 (H) 0.1 - 1.0 K/uL   Eosinophils Relative 0 %   Eosinophils Absolute 0.0 0.0 - 0.5 K/uL   Basophils Relative 0 %   Basophils Absolute 0.0 0.0 - 0.1 K/uL   Immature Granulocytes 1 %   Abs Immature Granulocytes 0.10 (H) 0.00 - 0.07 K/uL  Glucose, capillary     Status: Abnormal   Collection Time: 03/30/18  3:56 AM  Result Value Ref Range   Glucose-Capillary 114 (H) 70 - 99 mg/dL  Glucose, capillary     Status: Abnormal   Collection Time: 03/30/18  7:49 AM  Result Value Ref Range   Glucose-Capillary 104 (H) 70 - 99 mg/dL      [id number])  Dg Abd Portable 1v  Result Date: 03/29/2018 CLINICAL DATA:  Nasogastric tube placement EXAM: PORTABLE ABDOMEN - 1 VIEW COMPARISON:  Portable exam 0048 hours compared to CT abdomen and pelvis of 12/31/2017 FINDINGS: Nasogastric tube coiled in mid stomach with tip projecting over distal antrum. Air-filled loops of large and small bowel in mid abdomen with scattered stool in colon. Catheter versus thermal probe coiled in pelvis. LEFT lower lobe consolidation. Bones unremarkable. IMPRESSION: Nasogastric tube coiled in mid stomach with tip projecting over distal gastric antrum. Electronically Signed   By: Ulyses Southward M.D.   On: 03/29/2018 01:09      Assessment/Plan: Diagnosis: 63 year old male with severe debility related to v-fib cardiac arrest and pneumonia 1. Does the need for close, 24 hr/day medical supervision in concert with the patient's rehab needs make it unreasonable for  this patient to be served in a less intensive setting? Yes 2. Co-Morbidities requiring supervision/potential complications: NSTEMI, dysphagia/nutrition 3. Due to bladder management, bowel management, safety, skin/wound care, disease management, medication administration, pain management and patient education, does the patient require 24 hr/day rehab nursing? Yes 4. Does the patient require coordinated care of a physician, rehab nurse, PT (1-2 hrs/day, 5 days/week), OT (1-2 hrs/day, 5 days/week) and SLP (1-2 hrs/day, 5 days/week) to address  physical and functional deficits in the context of the above medical diagnosis(es)? Yes Addressing deficits in the following areas: balance, endurance, locomotion, strength, transferring, bowel/bladder control, bathing, dressing, feeding, grooming, toileting, swallowing and psychosocial support 5. Can the patient actively participate in an intensive therapy program of at least 3 hrs of therapy per day at least 5 days per week? Yes 6. The potential for patient to make measurable gains while on inpatient rehab is good 7. Anticipated functional outcomes upon discharge from inpatient rehab are modified independent  with PT, modified independent with OT, modified independent with SLP. 8. Estimated rehab length of stay to reach the above functional goals is: 11-17 days 9. Anticipated D/C setting: Home 10. Anticipated post D/C treatments: HH therapy 11. Overall Rehab/Functional Prognosis: excellent  RECOMMENDATIONS: This patient's condition is appropriate for continued rehabilitative care in the following setting: CIR Patient has agreed to participate in recommended program. Yes Note that insurance prior authorization may be required for reimbursement for recommended care.  Comment: Rehab Admissions Coordinator to follow up.  Thanks,  Ranelle Oyster, MD, [place] [facility]  I have personally performed a face to face diagnostic evaluation of this patient. Additionally,  I have examined pertinent labs and radiographic images. I have reviewed and concur with the physician assistant's documentation above.    Jacquelynn Cree, PA-C 03/30/2018        Revision History                        Routing History

## 2018-04-05 NOTE — Progress Notes (Signed)
ANTICOAGULATION CONSULT NOTE   Pharmacy Consult for lovenox Indication: pulmonary embolus  No Known Allergies  Vital Signs: Temp: 98.1 F (36.7 C) (03/29 1600) Temp Source: Oral (03/29 1600) BP: 139/86 (03/29 1600) Pulse Rate: 75 (03/29 1600)  Labs: Recent Labs    04/03/18 0248 04/04/18 0308  HGB 12.4*  --   HCT 38.2*  --   PLT 264  --   CREATININE 1.28* 1.25*   Estimated Creatinine Clearance: 54.3 mL/min (A) (by C-G formula based on SCr of 1.25 mg/dL (H)).  Assessment: 64 yoM with new PE on Lovenox treatment, now transferred to inpatient rehab. Pharmacy re-consulted for Lovenox dosing. Received last dose 3/29 @ 1248. Hgb 12.4 stable, pltc WNL. No bleeding noted. Scr 1.25 stable, estimated CrCl ~54 mL/min.  Goal of Therapy:  Monitor platelets by anticoagulation protocol: Yes   Plan:  Continue Lovenox 1.5mg /kg (95mg ) SQ q24h Monitor s/sx of bleeding  Erin N. Zigmund Daniel, PharmD, BCPS PGY2 Infectious Diseases Pharmacy Resident Phone: [phone] 04/05/2018 4:42 PM

## 2018-04-05 NOTE — Progress Notes (Signed)
Erick Colace, MD  Physician  Physical Medicine and Rehabilitation  PMR Pre-admission  Addendum  Date of Service:  04/03/2018 4:33 PM       Related encounter: ED to Hosp-Admission (Discharged) from 03/24/2018 in [place] [facility]         Show:Clear all [x] Manual[x] Template[x] Copied  Added by: [x] Standley Brooking, RN[x] Kirsteins, Victorino Sparrow, MD  [] Hover for details PMR Admission Coordinator Pre-Admission Assessment  Patient: Kevin Rojas is an 63 y.o., male MRN: [id number] DOB: 03/12/1955 Height: 5\' 10"  (177.8 cm) Weight: 64.9 kg                                                                                                                                                  Insurance Information HMO:     PPO: yes     PCP:      IPA:      80/20:      OTHER:  PRIMARY: [employer] [place]      Policy#: [id number]      Subscriber: pt CM Name: Carollee Herter      Phone#: [phone]     Fax#: [phone] Pre-Cert#: [id number]   Approved until 4/10 when updates are due   Employer:  Benefits:  Phone #: [phone]     Name: 04/03/2018 Eff. Date: 01/07/2018     Deduct: $2500      Out of Pocket Max: $7500 includes deductible      Life Max: none CIR: 80%      SNF: 80% 120 per year Outpatient: $25 to $40 per visit     Co-Pay: visits per medical neccesity Home Health: 80%      Co-Pay: 100 Visits per year DME: 80%     Co-Pay: 20% Providers: in network  SECONDARY: none     Medicaid Application Date:       Case Manager:  Disability Application Date:       Case Worker:   Emergency Contact Information         Contact Information    Name Relation Home Work Mobile   Bancroft,Jenene Spouse   [phone]   Rajiv, Suppes   [phone]     Current Medical History  Patient Admitting Diagnosis: cardiac arrest, debility  History of Present Illness:Zoran Grieris a 63 y.o.malewith history of LBBB, renal calculi, recent URI symptoms who was admitted from home on  03/24/2018 with V. fib cardiac arrest. He was treated with CPR x3 minutes by family as well as defibrillation by EMS. He was intubated and treated with cooling protocol. He was started on Unasyn for aspiration PNA. 2D echo showed EF 45-50% with severe AI as well as concerns of type A aortic dissection. CTA chest negative for PE on 3/17, dissection or fluid overload. EKG without acute changes but cardiac enzymes positive due to NSTEMI felt to be due to  demand ischemia. CT head negative for acute changes and EEG showed generalized slowing. He tolerated extubation on 03/28/18 and developed ileus withbilious emesis therefore NGT placed.  Ileus resolved and NGT removed with patient tolerating regular diet.  Cardiac cath performed 3/26 and Cardiothoracic surgery and EP following. Severe aortic regurgitation and mild aortic stenosis of bicuspid valve.  Plan for AVR and ICD after inpatient rehab for debility and preparation prior to surgery. Plan for ICD placement post AVR and for use of Life vest in between surgeries and for CIR .  On 3/28 CTA was complete as work up for AVR. Incidental finding of PE. Radiologist felt low burden clot, no present on CTA chest on 3/17. Lovenox initiated. Patient with no hypoxemia, LE edema, pleuritic chest pain or dyspnea.    Past Medical History  History reviewed. No pertinent past medical history.  Family History  family history includes Prostate cancer in his father; Pulmonary embolism in his mother.  Prior Rehab/Hospitalizations:  Has the patient had prior rehab or hospitalizations prior to admission? Yes  Has the patient had major surgery during 100 days prior to admission? No  Current Medications   Current Facility-Administered Medications:  .  0.9 %  sodium chloride infusion, 250 mL, Intravenous, PRN, Lennette Bihari, MD, Stopped at 04/02/18 1800 .  acetaminophen (TYLENOL) tablet 650 mg, 650 mg, Oral, Q4H PRN, Lennette Bihari, MD .  albuterol  (PROVENTIL) (2.5 MG/3ML) 0.083% nebulizer solution 2.5 mg, 2.5 mg, Nebulization, Q2H PRN, Lonia Blood, MD .  chlorhexidine (PERIDEX) 0.12 % solution 15 mL, 15 mL, Mouth Rinse, BID, Luciano Cutter, MD, 15 mL at 04/03/18 0844 .  diazepam (VALIUM) tablet 5 mg, 5 mg, Oral, Q6H PRN, Lennette Bihari, MD .  feeding supplement (ENSURE ENLIVE) (ENSURE ENLIVE) liquid 237 mL, 237 mL, Oral, BID BM, Lonia Blood, MD, 237 mL at 04/03/18 0844 .  heparin injection 5,000 Units, 5,000 Units, Subcutaneous, Q8H, Lennette Bihari, MD, 5,000 Units at 04/03/18 [phone] .  hydrALAZINE (APRESOLINE) injection 10-40 mg, 10-40 mg, Intravenous, Q4H PRN, Oretha Milch, MD, 20 mg at 03/28/18 5409 .  MEDLINE mouth rinse, 15 mL, Mouth Rinse, q12n4p, Luciano Cutter, MD, 15 mL at 04/03/18 1518 .  ondansetron (ZOFRAN) injection 4 mg, 4 mg, Intravenous, Q6H PRN, Lennette Bihari, MD .  pantoprazole (PROTONIX) EC tablet 40 mg, 40 mg, Oral, Q1200, Lonia Blood, MD, 40 mg at 04/03/18 1246 .  sodium chloride flush (NS) 0.9 % injection 3 mL, 3 mL, Intravenous, Q12H, Lennette Bihari, MD, 3 mL at 04/03/18 0844 .  sodium chloride flush (NS) 0.9 % injection 3 mL, 3 mL, Intravenous, PRN, Lennette Bihari, MD  Patients Current Diet:     Diet Order                  Diet Heart Room service appropriate? Yes; Fluid consistency: Thin  Diet effective now               Precautions / Restrictions Precautions Precautions: Fall, Other (comment) Precaution Comments: s/p r heart cath Restrictions Weight Bearing Restrictions: No   Has the patient had 2 or more falls or a fall with injury in the past year?No  Prior Activity Level Community (5-7x/wk): Independent, active; driving, cement truck; also is a Education officer, environmental  Prior Functional Level Prior Function Level of Independence: Independent Comments: drives, concrete truck driver  Self Care: Did the patient need help bathing, dressing, using the toilet or eating?  Independent  Indoor Mobility: Did the patient need assistance with walking from room to room (with or without device)? Independent  Stairs: Did the patient need assistance with internal or external stairs (with or without device)? Independent  Functional Cognition: Did the patient need help planning regular tasks such as shopping or remembering to take medications? Independent  Home Assistive Devices / Equipment Home Assistive Devices/Equipment: Grab bars in shower Home Equipment: None  Prior Device Use: Indicate devices/aids used by the patient prior to current illness, exacerbation or injury? None of the above  Current Functional Level Cognition  Arousal/Alertness: Awake/alert Overall Cognitive Status: Impaired/Different from baseline Current Attention Level: Selective Orientation Level: Oriented to person, Oriented to place, Disoriented to situation Following Commands: Follows one step commands consistently Safety/Judgement: Decreased awareness of deficits, Decreased awareness of safety General Comments: pt provided a pill box test and noted to have errors without awarness. pt was able to complete 1 pill a day instructions but when asked to complete multiple steps ( 3 pills per day at meals.) Pt could not figure out when meals occur. pt talking the whole task out loud to therapist and only given cues to finish the task and i will answer your questionsa t the end. Pt is a agreeable to home health RN to help with medication after this task. pt normally helps set up wife pill box.  Attention: Selective Selective Attention: Impaired Selective Attention Impairment: Verbal complex, Functional complex Memory: Impaired Memory Impairment: Decreased recall of new information, Decreased short term memory(immediate recall 4/5, delayed recall 1/5) Decreased Short Term Memory: Verbal basic, Functional basic Awareness: Impaired Awareness Impairment: Intellectual impairment(somewhat aware  of memory deficit) Problem Solving: Impaired Problem Solving Impairment: Verbal basic(slow processing, extended time, 2/3) Executive Function: Organizing, Sequencing Sequencing: Impaired Sequencing Impairment: Music therapist: Impaired Organizing Impairment: Functional basic Safety/Judgment: Impaired Comments: decreased awareness    Extremity Assessment (includes Sensation/Coordination)  Upper Extremity Assessment: Generalized weakness  Lower Extremity Assessment: Generalized weakness    ADLs  Overall ADL's : Needs assistance/impaired Eating/Feeding: Independent Grooming: Wash/dry hands, Wash/dry face, Standing, Supervision/safety Upper Body Bathing: Sitting, Maximal assistance Lower Body Bathing: Maximal assistance, Sit to/from stand, +2 for physical assistance Upper Body Dressing : Moderate assistance, Sitting Lower Body Dressing: Supervision/safety, Sit to/from stand Lower Body Dressing Details (indicate cue type and reason): pajama pants and socks Toilet Transfer: Ambulation, RW, Min guard Toilet Transfer Details (indicate cue type and reason): Min A for balance.  Toileting- Clothing Manipulation and Hygiene: Minimal assistance, Sit to/from stand Functional mobility during ADLs: Rolling walker, Min guard General ADL Comments: session focused on cognition with pill box test. pt required 17 minutes to complete task. pt unable to recall address with BLESS test but answering all other questions correct.     Mobility  Overal bed mobility: Modified Independent Bed Mobility: Supine to Sit Supine to sit: Min assist Sit to supine: Mod assist General bed mobility comments: in chai ron arrrival    Transfers  Overall transfer level: Needs assistance Equipment used: None Transfers: Sit to/from Stand Sit to Stand: Min guard Stand pivot transfers: Min assist General transfer comment: seated on arrival and seated task    Ambulation / Gait / Stairs / Wheelchair  Mobility  Ambulation/Gait Ambulation/Gait assistance: Min guard, Min assist Gait Distance (Feet): 150 Feet(X2) Assistive device: None Gait Pattern/deviations: Step-through pattern, Decreased stride length, Decreased dorsiflexion - right, Drifts right/left, Staggering right, Staggering left General Gait Details: Slow, unsteady gait, especially with dynamic gait tasks and with dual tasking.  Required multiple cues for appropriate step height as pt with shuffeled gait. Pt only able to recall 1/3 words from word recall task by end of gait training.  Gait velocity: Decreased Gait velocity interpretation: <1.8 ft/sec, indicate of risk for recurrent falls    Posture / Balance Dynamic Sitting Balance Sitting balance - Comments: no LOB with donning pants and socks Static Standing Balance Single Leg Stance - Right Leg: 6 Single Leg Stance - Left Leg: 1 Rhomberg - Eyes Opened: 10 Balance Overall balance assessment: Needs assistance Sitting-balance support: No upper extremity supported, Feet supported Sitting balance-Leahy Scale: Good Sitting balance - Comments: no LOB with donning pants and socks Standing balance support: No upper extremity supported, During functional activity Standing balance-Leahy Scale: Fair Standing balance comment: can release walker in static standing at sink Single Leg Stance - Right Leg: 6 Single Leg Stance - Left Leg: 1 Rhomberg - Eyes Opened: 10 Standardized Balance Assessment Standardized Balance Assessment : Dynamic Gait Index Dynamic Gait Index Level Surface: Mild Impairment Change in Gait Speed: Moderate Impairment Gait with Horizontal Head Turns: Mild Impairment Gait with Vertical Head Turns: Moderate Impairment Gait and Pivot Turn: Mild Impairment Step Over Obstacle: Mild Impairment Step Around Obstacles: Moderate Impairment Steps: Moderate Impairment Total Score: 13 Timed Up and Go Test TUG: Manual TUG Manual TUG (seconds): 11.9    Special  needs/care consideration BiPAP/CPAP n/a CPM n/a Continuous Drip IV n/a Dialysis n/a Life Vest to be obtained 3/27 prior to d/c to CIR Oxygen n/a Special Bed n/a Trach Size n/a Wound Vac n/a Skin n/a Bowel mgmt: continent Bladder mgmt: continent Diabetic mgmt n/a Behavioral consideration n/a Chemo/radiation  N/a     Previous Home Environment (from acute therapy documentation) Living Arrangements: Spouse/significant other  Lives With: Spouse Available Help at Discharge: Family, Available 24 hours/day Type of Home: House Home Layout: One level Home Access: Stairs to enter Entrance Stairs-Rails: Right Entrance Stairs-Number of Steps: 5+2 Bathroom Shower/Tub: Engineer, manufacturing systems: Standard Home Care Services: No Additional Comments: has access to a walker  Discharge Living Setting Plans for Discharge Living Setting: Patient's home, Lives with (comment)(spouse) Type of Home at Discharge: House Discharge Home Layout: One level Discharge Home Access: Stairs to enter Entrance Stairs-Rails: Right Entrance Stairs-Number of Steps: 5 +2 Discharge Bathroom Shower/Tub: Tub/shower unit, Curtain Discharge Bathroom Toilet: Standard Discharge Bathroom Accessibility: Yes How Accessible: Accessible via walker Does the patient have any problems obtaining your medications?: No  Social/Family/Support Systems Patient Roles: Spouse, Parent(employee and pastor) Contact Information: Jatayvion, son is main conatct and pt's wife second Anticipated Caregiver: wife and son Anticipated Caregiver's Contact Information: Jahn [phone] Ability/Limitations of Caregiver: wife 24/7; Artur is a Licensed conveyancer Caregiver Availability: 24/7 Discharge Plan Discussed with Primary Caregiver: Yes Is Caregiver In Agreement with Plan?: Yes Does Caregiver/Family have Issues with Lodging/Transportation while Pt is in Rehab?: No Goals/Additional Needs Patient/Family Goal for Rehab: Mod  I to supervision PT, OT, and SLP Expected length of stay: [facility] 7 to 10 days then to OR for Valve surgery Pt/Family Agrees to Admission and willing to participate: Yes Program Orientation Provided & Reviewed with Pt/Caregiver Including Roles  & Responsibilities: Yes  Decrease burden of Care through IP rehab admission: n/a  Possible need for SNF placement upon discharge: not anticipated  Patient Condition: This patient's medical and functional status has changed since the consult dated: 03/30/2018 in which the Rehabilitation Physician determined and documented that the patient's condition is appropriate for intensive rehabilitative care in an inpatient rehabilitation  facility. See "History of Present Illness" (above) for medical update. Functional changes are: overall min assist. Patient's medical and functional status update has been discussed with the Rehabilitation physician and patient remains appropriate for inpatient rehabilitation. Will admit to inpatient rehab 04/05/2018.  Preadmission Screen Completed By:  Clois Dupes, RN, MSN 04/03/2018 4:33 PM ______________________________________________________________________   Discussed status with Dr. Wynn Banker on 04/03/2018 at  1600 and received approval for 04/05/18  Admission Coordinator:  Clois Dupes, time 1600 Date 04/04/2018     Revision History

## 2018-04-05 NOTE — Progress Notes (Signed)
Gave report to [facility]. VSS.   Sheppard Evens RN

## 2018-04-06 ENCOUNTER — Inpatient Hospital Stay (HOSPITAL_COMMUNITY): Payer: BLUE CROSS/BLUE SHIELD | Admitting: Occupational Therapy

## 2018-04-06 ENCOUNTER — Inpatient Hospital Stay (HOSPITAL_COMMUNITY): Payer: BLUE CROSS/BLUE SHIELD

## 2018-04-06 ENCOUNTER — Inpatient Hospital Stay (HOSPITAL_COMMUNITY): Payer: BLUE CROSS/BLUE SHIELD | Admitting: Speech Pathology

## 2018-04-06 ENCOUNTER — Inpatient Hospital Stay (HOSPITAL_COMMUNITY): Payer: BLUE CROSS/BLUE SHIELD | Admitting: Physical Therapy

## 2018-04-06 NOTE — Progress Notes (Signed)
Complained of "indigestion & gas". Paged Dr. Wynn Banker with PRN orders for maalox, patient only wanted 1/2 dose. Kevin Rojas A

## 2018-04-06 NOTE — Progress Notes (Addendum)
[facility] PHYSICAL MEDICINE & REHABILITATION PROGRESS NOTE   Subjective/Complaints: Up in bed. Stated he felt bloated after eating beef yesterday. Feeling better this morning. Was able to sleep. Denies new SOB.   ROS: Patient denies fever, rash, sore throat, blurred vision, nausea, vomiting, diarrhea, cough, shortness of breath or chest pain, joint or back pain, headache, or mood change.   Objective:   Ct Angio Chest/abd/pel For Dissection W And/or W/wo  Result Date: 04/04/2018 CLINICAL DATA:  Cardiac arrest, severe aortic valvular insufficiency and aneurysmal dilatation of the ascending thoracic aorta by prior CTA of the chest timed for pulmonary artery evaluation. EXAM: CT ANGIOGRAPHY CHEST, ABDOMEN AND PELVIS TECHNIQUE: Multidetector CT imaging through the chest, abdomen and pelvis was performed using the standard protocol during bolus administration of intravenous contrast. Multiplanar reconstructed images and MIPs were obtained and reviewed to evaluate the vascular anatomy. CONTRAST:  OMNIPAQUE IOHEXOL 350 MG/ML SOLN COMPARISON:  CTA of the chest on 03/24/2018 FINDINGS: CTA CHEST FINDINGS Cardiovascular: The aortic root is mildly dilated and measures approximately 4.1-4.2 cm at the level of the sinuses of Valsalva. The aortic valve shows mild central calcification. The ascending thoracic aorta is mildly dilated and measures 4.2-4.3 cm in greatest diameter. The proximal arch measures approximately 3.3 cm. The distal arch demonstrates severe tortuosity and focal kinking prior to an area of mild dilatation measuring 3.2 cm. The descending thoracic aorta then tapers to a diameter of 2.8 cm. There is no evidence of aortic dissection. Proximal great vessels demonstrate normal branching anatomy and normal patency without aneurysmal disease. The heart is mildly enlarged. The left ventricle appears dilated. Trace anterior pericardial fluid without significant pericardial effusion. No significant  calcified coronary artery plaque is identified by CT. Although not optimized for pulmonary arterial evaluation, the pulmonary arteries are quite well opacified on the study and there is a new finding of definitive nonocclusive pulmonary embolism in the right lung with nonocclusive thrombus identified at a bifurcation of the right lower lobe pulmonary artery into segmental branches and within a right middle lobe pulmonary artery branch. No left-sided pulmonary embolism is identified. There is no evidence of central PE or saddle embolism. Mediastinum/Nodes: No enlarged mediastinal, hilar, or axillary lymph nodes. Thyroid gland, trachea, and esophagus demonstrate no significant findings. Lungs/Pleura: Mild scarring/atelectasis at both lung bases. There is no evidence of pulmonary edema, consolidation, pneumothorax, nodule or pleural fluid. No evidence of focal pulmonary infarction. Musculoskeletal: No chest wall abnormality. No acute or significant osseous findings. Review of the MIP images confirms the above findings. CTA ABDOMEN AND PELVIS FINDINGS VASCULAR Aorta: The abdominal aorta is normally patent and of normal caliber without evidence of aneurysm or dissection. No significant atherosclerosis. Celiac: Normally patent.  Normal branch vessel anatomy. SMA: Normally patent. Renals: A single right renal artery and 2 separate left renal arteries with small accessory upper pole artery demonstrate normal patency. IMA: Normally patent. Inflow: Bilateral iliac arteries demonstrate normal patency. Common femoral arteries and femoral bifurcations are widely patent. Review of the MIP images confirms the above findings. NON-VASCULAR Hepatobiliary: No focal liver abnormality is seen. No gallstones, gallbladder wall thickening, or biliary dilatation. Pancreas: Unremarkable. No pancreatic ductal dilatation or surrounding inflammatory changes. Spleen: Normal in size without focal abnormality. Adrenals/Urinary Tract: Adrenal  glands are unremarkable. Kidneys are normal, without renal calculi, focal lesion, or hydronephrosis. Bladder is unremarkable. Stomach/Bowel: Bowel shows no evidence of obstruction, ileus or inflammation. No free air identified. Lymphatic: No enlarged lymph nodes identified. Reproductive: Prostate is unremarkable. Other:  No hernias identified.  No free fluid or focal abscess. Musculoskeletal: Mild degenerative disc disease at L4-5 and L5-S1. Review of the MIP images confirms the above findings. IMPRESSION: 1. New, acute subsegmental nonocclusive pulmonary embolism in branches of the right lower lobe and right middle lobe pulmonary arteries. Overall volume of thrombus burden is low. This thrombus was not present on the CTA dated 03/24/2018. 2. Mild aneurysmal disease involving the aortic root and ascending thoracic aorta with the root measuring 4.2 cm in greatest diameter and the ascending thoracic aorta measuring 4.3 cm in greatest diameter. Associated partially calcified aortic valve. The aortic arch is very tortuous distally and demonstrates some degree of focal kinking due to tortuosity without evidence of significant stenosis. No evidence of aortic dissection. 3. Left ventricular cavity dilatation. 4. No evidence of aneurysmal disease or significant obstructive disease involving the abdominal aorta, iliac arteries, common femoral arteries or visceral branches of the abdominal aorta. These results were called by telephone at the time of interpretation on 04/04/2018 at 12:58 pm to Dr. Noralee Stain, who verbally acknowledged these results. Electronically Signed   By: Irish Lack M.D.   On: 04/04/2018 13:10   No results for input(s): WBC, HGB, HCT, PLT in the last 72 hours. Recent Labs    04/04/18 0308  NA 137  K 3.6  CL 108  CO2 23  GLUCOSE 141*  BUN 12  CREATININE 1.25*  CALCIUM 8.6*    Intake/Output Summary (Last 24 hours) at 04/06/2018 0923 Last data filed at 04/06/2018 0832 Gross per 24 hour   Intake 780 ml  Output -  Net 780 ml     Physical Exam: Vital Signs Blood pressure 138/78, pulse (!) 57, temperature 98.6 F (37 C), resp. rate 16, height 5\' 10"  (1.778 m), weight 64.9 kg, SpO2 99 %. Constitutional: No distress . Vital signs reviewed. HEENT: EOMI, oral membranes moist Neck: supple Cardiovascular: RRR without murmur. No JVD    Respiratory: CTA Bilaterally without wheezes or rales. Normal effort    GI: BS +, non-tender, non-distended  Extremities: No clubbing, cyanosis, or edema Skin: No evidence of breakdown, no evidence of rash Neurologic:alert and oriented x3. Motor 3-4/5 all 4's. Cognitively intact.  Musculoskeletal:  Full ROM Psych: pleasant   Assessment/Plan: 1. Functional deficits secondary to debility which require 3+ hours per day of interdisciplinary therapy in a comprehensive inpatient rehab setting.  Physiatrist is providing close team supervision and 24 hour management of active medical problems listed below.  Physiatrist and rehab team continue to assess barriers to discharge/monitor patient progress toward functional and medical goals  Care Tool:  Bathing              Bathing assist       Upper Body Dressing/Undressing Upper body dressing        Upper body assist      Lower Body Dressing/Undressing Lower body dressing            Lower body assist       Toileting Toileting    Toileting assist Assist for toileting: Contact Guard/Touching assist     Transfers Chair/bed transfer  Transfers assist     Chair/bed transfer assist level: Supervision/Verbal cueing     Locomotion Ambulation   Ambulation assist      Assist level: Contact Guard/Touching assist       Walk 10 feet activity   Assist           Walk 50 feet activity   Assist  Walk 150 feet activity   Assist           Walk 10 feet on uneven surface  activity   Assist           Wheelchair     Assist                Wheelchair 50 feet with 2 turns activity    Assist            Wheelchair 150 feet activity     Assist          Medical Problem List and Plan: 1. Debility secondary to v fib cardiac arrest   -Patient is beginning CIR therapies today including PT and OT   -follow up with Surgery re timing of TAVR 2. Antithrombotics: -DVT/anticoagulation: Pharmaceutical: Lovenox mg/kg -antiplatelet therapy: none 3. Pain Management: Acetaminophen 650mg  Q 6 h prn 4. Mood:  -antipsychotic agents: monitor for depression/adjustment issues 5. Neuropsych: This patient is capable of making decisions on his own behalf. 6. Skin/Wound Care: monitor for skin breakdown related to immobility and prolonged bedrest 7. Fluids/Electrolytes/Nutrition: monitor Is and Os, HDPD diet   -encourage PO  -maalox for bloating  -check labs as available    LOS: 1 days A FACE TO FACE EVALUATION WAS PERFORMED  Ranelle Oyster 04/06/2018, 9:23 AM

## 2018-04-06 NOTE — Plan of Care (Signed)
  Problem: RH PAIN MANAGEMENT Goal: RH STG PAIN MANAGED AT OR BELOW PT'S PAIN GOAL Description Less than 3 out of 10  Outcome: Progressing   Problem: RH KNOWLEDGE DEFICIT GENERAL Goal: RH STG INCREASE KNOWLEDGE OF SELF CARE AFTER HOSPITALIZATION Description Pt will be able to demonstrate proper use and care for life vest with min cues and supervision.   Outcome: Progressing

## 2018-04-06 NOTE — Evaluation (Addendum)
Physical Therapy Assessment and Plan  Patient Details  Name: Kevin Rojas MRN: [id number] Date of Birth: 1955/08/21  PT Diagnosis: Abnormality of gait and Muscle weakness Rehab Potential: Good ELOS: 5-7 days   Today's Date: 04/06/2018 PT Individual Time: 1415-1530 PT Individual Time Calculation (min): 75 min    Problem List:  Patient Active Problem List   Diagnosis Date Noted  . Debility 04/05/2018  . Acute pulmonary embolism ([place]) 04/05/2018  . Acute respiratory failure with hypoxia ([place])   . Aspiration into airway   . Severe aortic insufficiency 03/26/2018  . Cardiac arrest ([place]) 03/24/2018  . LBBB (left bundle branch block) 03/24/2018  . Elevated troponin 03/24/2018  . Hypokalemia 03/24/2018  . Endotracheally intubated 03/24/2018  . Abnormal CXR 03/24/2018  . Aspiration pneumonia ([place]) 03/24/2018    Past Medical History: History reviewed. No pertinent past medical history. Past Surgical History:  Past Surgical History:  Procedure Laterality Date  . AORTIC ARCH ANGIOGRAPHY N/A 04/02/2018   Procedure: AORTIC ARCH ANGIOGRAPHY;  Surgeon: Troy Sine, MD;  Location: [facility];  Service: Cardiovascular;  Laterality: N/A;  . RIGHT/LEFT HEART CATH AND CORONARY ANGIOGRAPHY N/A 04/02/2018   Procedure: RIGHT/LEFT HEART CATH AND CORONARY ANGIOGRAPHY;  Surgeon: Troy Sine, MD;  Location: [facility];  Service: Cardiovascular;  Laterality: N/A;    Assessment & Plan Clinical Impression:Kevin Grieris a 63 y.o.malewith history of LBBB, renal calculi, recent URI symptoms who was admitted from home on 03/24/2018 with V. fib cardiac arrest. He was treated with CPR x3 minutes by family as well as defibrillation by EMS. He was intubated and treated with cooling protocol. He was started on Unasyn for aspiration PNA. 2D echo showed EF 45-50% with severe AI as well as concerns of type A aortic dissection. CTA chest negative for PE, dissection or fluid overload. EKG  without acute changes but cardiac enzymes positive due to NSTEMI felt to be due to demand ischemia. Cardiology recommends cath pending recovery. CT head negative for acute changes and EEG showed generalized slowing. He tolerated extubation on 03/28/18 and developed ileus withbilious emesis therefore NGT placed. Therapy evaluations revealed debility and CIR recommended due to functional decline.  On 04/04/2018 underwent CT angio of chest to evaluate for dissection. THere was no evidence of dissection but incidental finding of bilateral subsegmental non-occlusive PE and pt was started on 1.72m per kg Enoxaparin q 24h Patient transferred to CIR on 04/05/2018 .   Patient currently requires min with mobility secondary to muscle weakness and decreased cardiorespiratoy endurance.  Prior to hospitalization, patient was independent  with mobility and lived with Spouse in a House home.  Home access is 4 & 4 from outside, 11 from insideStairs to enter.  Patient will benefit from skilled PT intervention to maximize safe functional mobility and minimize fall risk for planned discharge home with intermittent assist.  [facility]  PT - End of Session Activity Tolerance: Improving;Decreased this session;Tolerates 30+ min activity with multiple rests Endurance Deficit: Yes Endurance Deficit Description: generalized deconditioning PT Assessment Rehab Potential (ACUTE/IP ONLY): Good PT Patient demonstrates impairments in the following area(s): Balance;Endurance;Motor;Safety PT Transfers Functional Problem(s): Bed Mobility;Bed to Chair;Car;Furniture PT Locomotion Functional Problem(s): Ambulation;Stairs PT Plan PT Intensity: Minimum of 1-2 x/day ,45 to 90 minutes PT Frequency: 5 out of 7 days PT Duration Estimated Length of Stay: 5-7 days PT Treatment/Interventions: Ambulation/gait training;Functional mobility training;Therapeutic Activities;Balance/vestibular  training;Neuromuscular re-education;Therapeutic Exercise;Pain management;DME/adaptive equipment instruction;Cognitive remediation/compensation;Functional electrical stimulation;Patient/family  education;Stair training PT Transfers Anticipated Outcome(s): mod I  PT Locomotion Anticipated Outcome(s): mod I PT Recommendation Follow Up Recommendations: Home health PT Patient destination: Home Equipment Recommended: None recommended by PT  Skilled Therapeutic Intervention Patient in recliner in room skypeing his wife on laptop.  Evaluation procedures explained and pt gave recent history and home situation.  Patient sit to stand CGA and ambulated to general gym with CGA to S.  Noted wide BOS, shuffling pattern and leans back on hip ligaments.  Patient performed sit<.>supine on mat with S and rolling.  Patient performed Berg balance assessment as noted below, score 49/56.  Discussed results and plan to address.  Patient negotiated 12 steps with rails and CGA step through pattern.  Gait to ortho gym for car transfer at [place] height with cues and CGA.  Negotiated ramp/mulch/curb with min A.  Patient ambulated back to room >200' with CGA/S, one LOB with cross step to recover.  In recliner in room educated on exercises he can perform in room for LE strength with orange t-band including LAQ, hip flexion & hip abduction.  Patient left in recliner with seat alarm on and call bell/needs in reach.  PT Evaluation Precautions/Restrictions Precautions Precautions: Fall;Other (comment) Precaution Comments: life vest Restrictions Weight Bearing Restrictions: No General   Vital SignsTherapy Vitals Temp: 98.3 F (36.8 C) Temp Source: Oral Pulse Rate: 68 Resp: 18 BP: (!) 148/84 Patient Position (if appropriate): Sitting Oxygen Therapy SpO2: 100 % O2 Device: Room Air Pain Pain Assessment Pain Scale: 0-10 Pain Score: 0-No pain Pain Type: Acute pain Pain Location: Generalized Pain Descriptors / Indicators:  Sore Pain Onset: On-going Pain Intervention(s): Repositioned;Ambulation/increased activity Home Living/Prior Functioning Home Living Available Help at Discharge: Family;Available 24 hours/day Type of Home: House Home Access: Stairs to enter [employer] of Steps: 4 & 4 from outside, 11 from inside Entrance Stairs-Rails: Right Home Layout: One level Bathroom Shower/Tub: Chiropodist: Standard  Lives With: Spouse Prior Function Level of Independence: Independent with basic ADLs;Independent with transfers;Independent with gait  Able to Take Stairs?: Yes Driving: Yes Vocation: Full time employment Leisure: Hobbies-yes (Comment) Comments: drives, concrete truck driver; 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), loves fishing Vision/Perception  Perception Perception: Within Functional Limits Praxis Praxis: Intact  Cognition Overall Cognitive Status: Impaired/Different from baseline Arousal/Alertness: Awake/alert Orientation Level: Oriented X4 Attention: Selective Selective Attention: Impaired Selective Attention Impairment: Verbal complex;Functional complex Memory: Impaired Memory Impairment: Decreased recall of new information;Decreased short term memory Decreased Short Term Memory: Verbal basic;Functional basic Awareness: Impaired Awareness Impairment: Anticipatory impairment Problem Solving: Impaired Problem Solving Impairment: Verbal complex;Functional complex Executive Function: Reasoning;Organizing Reasoning: Impaired Reasoning Impairment: Verbal complex;Functional complex Sequencing: Impaired Sequencing Impairment: Verbal complex;Functional complex Organizing: Impaired Organizing Impairment: Verbal complex;Functional complex Safety/Judgment: Impaired Comments: improved, but slight deficits on awareness Sensation Sensation Light Touch: Appears Intact Proprioception: Appears Intact Coordination Gross Motor Movements are Fluid and  Coordinated: Yes Fine Motor Movements are Fluid and Coordinated: Yes Motor  Motor Motor: Within Functional Limits Motor - Skilled Clinical Observations: generalized weakness  Mobility Bed Mobility Bed Mobility: Supine to Sit Supine to Sit: Supervision/Verbal cueing Transfers Transfers: Sit to Stand;Stand to Sit;Stand Pivot Transfers Sit to Stand: Contact Guard/Touching assist Stand to Sit: Contact Guard/Touching assist Stand Pivot Transfers: Contact Guard/Touching assist Transfer (Assistive device): None Locomotion  Gait Ambulation: Yes Gait Assistance: Contact Guard/Touching assist Gait Distance (Feet): 200 Feet Assistive device: None Gait Gait: Yes Gait Pattern: Impaired Gait Pattern: Step-through pattern;Decreased stride length;Shuffle;Poor foot clearance -  right;Poor foot clearance - left;Decreased hip/knee flexion - left;Decreased hip/knee flexion - right Stairs / Additional Locomotion Stairs: Yes Stairs Assistance: Contact Guard/Touching assist Stair Management Technique: Two rails;Alternating pattern;Forwards Number of Stairs: 12 Height of Stairs: 3(& 6") Ramp: Contact Guard/touching assist Curb: Contact Guard/Touching assist Wheelchair Mobility Wheelchair Mobility: No  Trunk/Postural Assessment  Cervical Assessment Cervical Assessment: Within Functional Limits Thoracic Assessment Thoracic Assessment: Within Functional Limits Lumbar Assessment Lumbar Assessment: Within Functional Limits Postural Control Postural Control: Deficits on evaluation Righting Reactions: slightly delayed  Balance Standardized Balance Assessment Standardized Balance Assessment: Berg Balance Test Berg Balance Test Sit to Stand: Able to stand without using hands and stabilize independently Standing Unsupported: Able to stand safely 2 minutes Sitting with Back Unsupported but Feet Supported on Floor or Stool: Able to sit safely and securely 2 minutes Stand to Sit: Sits safely with  minimal use of hands Transfers: Able to transfer safely, minor use of hands Standing Unsupported with Eyes Closed: Able to stand 10 seconds with supervision Standing Ubsupported with Feet Together: Able to place feet together independently and stand 1 minute safely From Standing, Reach Forward with Outstretched Arm: Can reach confidently >25 cm (10") From Standing Position, Pick up Object from Floor: Able to pick up shoe, needs supervision From Standing Position, Turn to Look Behind Over each Shoulder: Looks behind one side only/other side shows less weight shift Turn 360 Degrees: Able to turn 360 degrees safely but slowly Standing Unsupported, Alternately Place Feet on Step/Stool: Able to stand independently and safely and complete 8 steps in 20 seconds Standing Unsupported, One Foot in Front: Able to take small step independently and hold 30 seconds Standing on One Leg: Able to lift leg independently and hold > 10 seconds Total Score: 49 Static Sitting Balance Static Sitting - Level of Assistance: 6: Modified independent (Device/Increase time) Dynamic Sitting Balance Dynamic Sitting - Balance Support: Feet supported Dynamic Sitting - Level of Assistance: 5: Stand by assistance Dynamic Sitting - Balance Activities: Lateral lean/weight shifting Static Standing Balance Static Standing - Level of Assistance: 5: Stand by assistance Dynamic Standing Balance Dynamic Standing - Balance Support: During functional activity Dynamic Standing - Level of Assistance: 4: Min assist Extremity Assessment      RLE Assessment RLE Assessment: Exceptions to [facility] Passive Range of Motion (PROM) Comments: WFL Active Range of Motion (AROM) Comments: [facility] General Strength Comments: hip flexion 3+/5, knee extension 4/5, ankle DF 4/5 LLE Assessment LLE Assessment: Exceptions to [facility] Passive Range of Motion (PROM) Comments: WFL Active Range of Motion (AROM) Comments: WFL General Strength Comments: hip flexion  3+/5, knee extension 4/5, ankle DF 4/5    Refer to Care Plan for Long Term Goals  Recommendations for other services: None   Discharge Criteria: Patient will be discharged from PT if patient refuses treatment 3 consecutive times without medical reason, if treatment goals not met, if there is a change in medical status, if patient makes no progress towards goals or if patient is discharged from hospital.  The above assessment, treatment plan, treatment alternatives and goals were discussed and mutually agreed upon: by patient  Jamison Oka, PT 04/06/2018, 4:27 PM

## 2018-04-06 NOTE — Evaluation (Signed)
Occupational Therapy Assessment and Plan  Patient Details  Name: Kevin Rojas MRN: [id number] Date of Birth: 1955/05/18  OT Diagnosis: muscle weakness (generalized) Rehab Potential: Rehab Potential (ACUTE ONLY): Good ELOS: 5-7 days   Today's Date: 04/06/2018 OT Individual Time: 7530-0511 OT Individual Time Calculation (min): 69 min     Problem List:  Patient Active Problem List   Diagnosis Date Noted  . Debility 04/05/2018  . Acute pulmonary embolism ([place]) 04/05/2018  . Acute respiratory failure with hypoxia ([place])   . Aspiration into airway   . Severe aortic insufficiency 03/26/2018  . Cardiac arrest ([place]) 03/24/2018  . LBBB (left bundle branch block) 03/24/2018  . Elevated troponin 03/24/2018  . Hypokalemia 03/24/2018  . Endotracheally intubated 03/24/2018  . Abnormal CXR 03/24/2018  . Aspiration pneumonia ([place]) 03/24/2018    Past Medical History: History reviewed. No pertinent past medical history. Past Surgical History:  Past Surgical History:  Procedure Laterality Date  . AORTIC ARCH ANGIOGRAPHY N/A 04/02/2018   Procedure: AORTIC ARCH ANGIOGRAPHY;  Surgeon: Kevin Sine, MD;  Location: [facility];  Service: Cardiovascular;  Laterality: N/A;  . RIGHT/LEFT HEART CATH AND CORONARY ANGIOGRAPHY N/A 04/02/2018   Procedure: RIGHT/LEFT HEART CATH AND CORONARY ANGIOGRAPHY;  Surgeon: Kevin Sine, MD;  Location: [facility];  Service: Cardiovascular;  Laterality: N/A;    Assessment & Plan Clinical Impression: Kevin Rojas a 63 y.o.malewith history of LBBB, renal calculi, recent URI symptoms who was admitted from home on 03/24/2018 with V. fib cardiac arrest. He was treated with CPR x3 minutes by family as well as defibrillation by EMS. He was intubated and treated with cooling protocol. He was started on Unasyn for aspiration PNA. 2D echo showed EF 45-50% with severe AI as well as concerns of type A aortic dissection. CTA chest negative for PE,  dissection or fluid overload. EKG without acute changes but cardiac enzymes positive due to NSTEMI felt to be due to demand ischemia. Cardiology recommends cath pending recovery. CT head negative for acute changes and EEG showed generalized slowing. He tolerated extubation on 03/28/18 and developed ileus withbilious emesis therefore NGT placed. Therapy evaluations revealed debility and CIR recommended due to functional decline.  On 04/04/2018 underwent CT angio of chest to evaluate for dissection.  THere was no evidence of dissection but incidental finding of bilateral subsegmental non-occlusive PE and pt was started on 1.25m per kg Enoxaparin q 24h Patient transferred to CIR on 04/05/2018 .    Patient currently requires CGA with basic self-care skills secondary to muscle weakness, decreased cardiorespiratoy endurance, decreased problem solving and decreased safety awareness and decreased standing balance and decreased balance strategies.  Prior to hospitalization, patient could complete ADLs with independent .  Patient will benefit from skilled intervention to decrease level of assist with basic self-care skills and increase level of independence with iADL prior to discharge home with care partner.  Anticipate patient will require no further (S) and no further OT follow recommended.  OT - End of Session Activity Tolerance: Tolerates 10 - 20 min activity with multiple rests Endurance Deficit: Yes Endurance Deficit Description: generalized deconditioning OT Assessment Rehab Potential (ACUTE ONLY): Good OT Patient demonstrates impairments in the following area(s): Balance;Cognition;Safety;Endurance OT Basic ADL's Functional Problem(s): Bathing;Dressing;Toileting OT Transfers Functional Problem(s): Toilet;Tub/Shower OT Additional Impairment(s): None OT Plan OT Intensity: Minimum of 1-2 x/day, 45 to 90 minutes OT Frequency: 5 out of 7 days OT Duration/Estimated Length of Stay: 5-7 days OT  Treatment/Interventions: Balance/vestibular training;Discharge planning;Self Care/advanced  ADL retraining;Therapeutic Activities;UE/LE Coordination activities;Cognitive remediation/compensation;Functional mobility training;Disease mangement/prevention;Patient/family education;Therapeutic Exercise;Community reintegration;DME/adaptive equipment instruction;UE/LE Strength taining/ROM;Psychosocial support OT Self Feeding Anticipated Outcome(s): no goal set OT Basic Self-Care Anticipated Outcome(s): mod I OT Toileting Anticipated Outcome(s): mod I OT Bathroom Transfers Anticipated Outcome(s): mod I OT Recommendation Patient destination: Home Follow Up Recommendations: None Equipment Recommended: To be determined   Skilled Therapeutic Intervention Skilled OT evaluation completed. Briefly spoke with pt's wife re family edu and program via FaceTime. Reviewed OT POC, goals, and current condition with pt. Pt included on goal creation and in establishing priorities for d/c. Pt completed ADls standing at the sink as described below. Minimal cueing provided for seated level LB dressing to maximize safety/reduce fall risk. Pt able to complete tub transfer with use of grab bar with CGA. Pt completed 150 ft of functional mobility with self reported fatigue, no overt LOB, CGA provided throughout. Pt left sitting up in recliner with all needs met, chair pad alarm set and pt educated on unit's fall policy.   OT Evaluation Precautions/Restrictions  Precautions Precautions: Fall;Other (comment) Precaution Comments: life vest Restrictions Weight Bearing Restrictions: No General Family/Caregiver Present: No Vital Signs BP: 134/79 HR: 66 bpm SpO2: 99% on RA Pain Pain Assessment Pain Scale: 0-10 Pain Score: 0-No pain Home Living/Prior Functioning Home Living Family/patient expects to be discharged to:: Private residence Living Arrangements: Spouse/significant other Available Help at Discharge: Family,  Available 24 hours/day Type of Home: House Home Access: Stairs to enter [employer] of Steps: 5+2 Entrance Stairs-Rails: Right Home Layout: One level Bathroom Shower/Tub: Chiropodist: Standard  Lives With: Spouse IADL History Homemaking Responsibilities: Yes Meal Prep Responsibility: Secondary Laundry Responsibility: Secondary Cleaning Responsibility: Secondary Bill Paying/Finance Responsibility: Secondary Shopping Responsibility: Secondary Child Care Responsibility: Secondary Current License: Yes Mode of Transportation: Car Occupation: Full time employment Leisure and Hobbies: fish, play guitar/sing, preach  Prior Function Level of Independence: Independent with basic ADLs, Independent with transfers, Independent with gait  Able to Take Stairs?: Yes Driving: Yes Vocation: Full time employment Comments: drives, concrete truck driver ADL ADL Eating: Independent Where Assessed-Eating: Edge of bed Grooming: Supervision/safety Where Assessed-Grooming: Standing at sink Upper Body Bathing: Supervision/safety Where Assessed-Upper Body Bathing: Standing at sink Lower Body Bathing: Contact guard Where Assessed-Lower Body Bathing: Standing at sink Upper Body Dressing: Supervision/safety Where Assessed-Upper Body Dressing: Standing at sink Lower Body Dressing: Contact guard, Minimal cueing Where Assessed-Lower Body Dressing: Standing at sink, Sitting at sink Toileting: Contact guard Where Assessed-Toileting: Glass blower/designer: Contact guard, Minimal verbal cueing Toilet Transfer Method: Counselling psychologist: Grab bars Vision Baseline Vision/History: No visual deficits Patient Visual Report: No change from baseline Vision Assessment?: No apparent visual deficits Perception  Perception: Within Functional Limits Praxis Praxis: Intact Cognition Overall Cognitive Status: Impaired/Different from baseline Arousal/Alertness:  Awake/alert Orientation Level: Person;Place;Situation Person: Oriented Place: Oriented Situation: Oriented Year: 2020 Month: March Day of Week: Correct Memory: Impaired Memory Impairment: Decreased recall of new information;Decreased short term memory Immediate Memory Recall: Sock;Blue;Bed Memory Recall: Sock Memory Recall Sock: Without Cue Attention: Selective Selective Attention: Impaired Selective Attention Impairment: Verbal complex;Functional complex Awareness: Impaired Awareness Impairment: Emergent impairment Safety/Judgment: Impaired Comments: improved, but slight deficits on awareness Sensation Sensation Light Touch: Appears Intact Hot/Cold: Appears Intact Coordination Gross Motor Movements are Fluid and Coordinated: Yes Fine Motor Movements are Fluid and Coordinated: Yes Motor  Motor Motor: Within Functional Limits Mobility  Bed Mobility Bed Mobility: Supine to Sit Supine to Sit: Supervision/Verbal cueing Transfers Sit to Stand: Contact Guard/Touching  assist Stand to Sit: Contact Guard/Touching assist  Trunk/Postural Assessment  Cervical Assessment Cervical Assessment: Within Functional Limits Thoracic Assessment Thoracic Assessment: Within Functional Limits Lumbar Assessment Lumbar Assessment: Within Functional Limits Postural Control Postural Control: Deficits on evaluation Righting Reactions: slightly delayed  Balance Balance Balance Assessed: Yes Static Sitting Balance Static Sitting - Balance Support: Feet supported Static Sitting - Level of Assistance: 6: Modified independent (Device/Increase time) Dynamic Sitting Balance Dynamic Sitting - Balance Support: Feet supported Dynamic Sitting - Level of Assistance: 5: Stand by assistance Dynamic Sitting - Balance Activities: Reaching for weighted objects Static Standing Balance Static Standing - Balance Support: During functional activity Static Standing - Level of Assistance: 5: Stand by  assistance Dynamic Standing Balance Dynamic Standing - Balance Support: During functional activity Dynamic Standing - Level of Assistance: 4: Min assist Extremity/Trunk Assessment RUE Assessment RUE Assessment: Exceptions to [facility] General Strength Comments: 4-/5, generalized weakness, grip strength 100 lbs LUE Assessment LUE Assessment: Exceptions to [facility] Main General Strength Comments: 4-/5, generalized weakness, grip strength 80 lbs     Refer to Care Plan for Long Term Goals  Recommendations for other services: None    Discharge Criteria: Patient will be discharged from OT if patient refuses treatment 3 consecutive times without medical reason, if treatment goals not met, if there is a change in medical status, if patient makes no progress towards goals or if patient is discharged from hospital.  The above assessment, treatment plan, treatment alternatives and goals were discussed and mutually agreed upon: by patient  Curtis Sites 04/06/2018, 11:48 AM

## 2018-04-06 NOTE — Evaluation (Signed)
Speech Language Pathology Assessment and Plan  Patient Details  Name: Kevin Rojas MRN: [id number] Date of Birth: 07-Aug-1955  SLP Diagnosis: Cognitive Impairments  Rehab Potential: Good ELOS: 5 to 7 days    Today's Date: 04/06/2018 SLP Individual Time: 0900-1000 SLP Individual Time Calculation (min): 60 min   Problem List:  Patient Active Problem List   Diagnosis Date Noted  . Debility 04/05/2018  . Acute pulmonary embolism ([place]) 04/05/2018  . Acute respiratory failure with hypoxia ([place])   . Aspiration into airway   . Severe aortic insufficiency 03/26/2018  . Cardiac arrest ([place]) 03/24/2018  . LBBB (left bundle branch block) 03/24/2018  . Elevated troponin 03/24/2018  . Hypokalemia 03/24/2018  . Endotracheally intubated 03/24/2018  . Abnormal CXR 03/24/2018  . Aspiration pneumonia ([place]) 03/24/2018   Past Medical History: History reviewed. No pertinent past medical history. Past Surgical History:  Past Surgical History:  Procedure Laterality Date  . AORTIC ARCH ANGIOGRAPHY N/A 04/02/2018   Procedure: AORTIC ARCH ANGIOGRAPHY;  Surgeon: Troy Sine, MD;  Location: [facility];  Service: Cardiovascular;  Laterality: N/A;  . RIGHT/LEFT HEART CATH AND CORONARY ANGIOGRAPHY N/A 04/02/2018   Procedure: RIGHT/LEFT HEART CATH AND CORONARY ANGIOGRAPHY;  Surgeon: Troy Sine, MD;  Location: [facility];  Service: Cardiovascular;  Laterality: N/A;    Assessment / Plan / Recommendation Clinical Impression Kevin Rojas is a 63 y.o. male with history of LBBB, renal calculi, recent URI symptoms who was admitted from home on 03/24/2018 with V. fib cardiac arrest.  He was treated with CPR x3 minutes by family as well as defibrillation by EMS. He was intubated and treated with cooling protocol. He was started on Unasyn for aspiration PNA. 2D echo showed EF 45-50% with severe AI as well as concerns of type A aortic dissection.  CTA chest negative for PE, dissection or fluid  overload.  EKG without acute changes but cardiac enzymes positive due to NSTEMI felt to be due to demand ischemia.  Cardiology recommends cath pending recovery.  CT head negative for acute changes and EEG showed generalized slowing. He tolerated extubation on 03/28/18 and developed ileus with bilious emesis therefore NGT placed. Therapy evaluations revealed debility and CIR recommended due to functional decline.   Comprehensive cognitive linguistic evaluation completed on 04/06/18. Cognistat administered with deficits in short term memory and recall, selective attention, complex problem solving and anticipatory awareness. While pt's cognitive abilities are vastly improved from 3/26's score fo 19 on [place], he continues to require skilled ST services to target higher cognitive functions, increase functional independence and reduce caregiver burden. At this time, I don't anticipate that pt will require any follow up ST services.    Skilled Therapeutic Interventions           Skilled treatment session focused on completion of cognitive linguistic evaluation, see above. Education provided on results of evaluation, questions answered and pt input received when developing plan of care.     SLP Assessment  Patient will need skilled [facility] during CIR admission    Recommendations  Patient destination: Home Follow up Recommendations: None Equipment Recommended: None recommended by SLP    SLP Frequency 3 to 5 out of 7 days   SLP Duration  SLP Intensity  SLP Treatment/Interventions 5 to 7 days  Minumum of 1-2 x/day, 30 to 90 minutes  Cognitive remediation/compensation;Patient/family education;Functional tasks;Medication managment    Pain Pain Assessment Pain Scale: 0-10 Pain Score: 0-No pain  Prior Functioning  Cognitive/Linguistic Baseline: Within functional limits Type of Home: House  Lives With: Spouse Available Help at Discharge: Family;Available 24  hours/day Vocation: Full time employment  Short Term Goals: Week 1: SLP Short Term Goal 1 (Week 1): Pt will utilize external memory aids to recall daily information with Mod I.  SLP Short Term Goal 2 (Week 1): Pt will complete complex reasoning tasks with Mod I.  SLP Short Term Goal 3 (Week 1): Pt will complete complex problem solving tasks with Mod I.  SLP Short Term Goal 4 (Week 1): Pt will demonstrate anticipatory awareness by problem solving hypothetical situations within home and community with Mod I.  SLP Short Term Goal 5 (Week 1): Pt will demonstrate selective attention in moderately distracting environment for ~ 45 minutes with Mod I.   Refer to Care Plan for Long Term Goals  Recommendations for other services: None   Discharge Criteria: Patient will be discharged from SLP if patient refuses treatment 3 consecutive times without medical reason, if treatment goals not met, if there is a change in medical status, if patient makes no progress towards goals or if patient is discharged from hospital.  The above assessment, treatment plan, treatment alternatives and goals were discussed and mutually agreed upon: by patient  Brittny Spangle 04/06/2018, 4:18 PM

## 2018-04-06 NOTE — Progress Notes (Signed)
Occupational Therapy Session Note  Patient Details  Name: Kevin Rojas MRN: [id number] Date of Birth: 1955/04/02  Today's Date: 04/06/2018 OT Individual Time: 1245-1330 OT Individual Time Calculation (min): 45 min    Short Term Goals: Week 1:  OT Short Term Goal 1 (Week 1): STG= LTG d/t ELOS  Skilled Therapeutic Interventions/Progress Updates:  Pt received sitting up in recliner with no c/o pain. Pt completed 150 ft of functional mobility with CGA. Pt completed functional stepping activity with 6" step to challenge dynamic standing balance. 1 mild LOB with min A to correct. Pt completed sit <> stand activity with functional reach holding 2 kg ball. Pt became suddenly emotional re condition, reminiscing about "things he'll never do again". Extensive emotional support, therapeutic use of self, and therapeutic touch used to comfort pt and encourage recovery. Pt was thankful for therapist support and able to move forward in session. Pt used same weighted ball and completed brief B UE circuit to increase BUE strength/endurance. Pt returned to room and was left sitting up with all needs met, chair alarm set.   Skilled monitoring of vitals throughout session- no significant change in vitals throughout.   Therapy Documentation Precautions:  Precautions Precautions: Fall, Other (comment) Precaution Comments: life vest Restrictions Weight Bearing Restrictions: No  Pain: Pain Assessment Pain Scale: 0-10 Pain Score: 0-No pain   Therapy/Group: Individual Therapy  Curtis Sites 04/06/2018, 12:59 PM

## 2018-04-06 NOTE — Progress Notes (Signed)
Patient information reviewed and entered into [facility] System by Becky Brach Birdsall, PPS coordinator. Information including medical coding, function ability, and quality indicators will be reviewed and updated through discharge.   

## 2018-04-07 ENCOUNTER — Inpatient Hospital Stay (HOSPITAL_COMMUNITY): Payer: BLUE CROSS/BLUE SHIELD

## 2018-04-07 ENCOUNTER — Inpatient Hospital Stay (HOSPITAL_COMMUNITY): Payer: BLUE CROSS/BLUE SHIELD | Admitting: Physical Therapy

## 2018-04-07 ENCOUNTER — Inpatient Hospital Stay (HOSPITAL_COMMUNITY): Payer: BLUE CROSS/BLUE SHIELD | Admitting: Speech Pathology

## 2018-04-07 DIAGNOSIS — K5900 Constipation, unspecified: Secondary | ICD-10-CM

## 2018-04-07 HISTORY — DX: Constipation, unspecified: K59.00

## 2018-04-07 MED ORDER — PANTOPRAZOLE SODIUM 40 MG PO TBEC
40.0000 mg | DELAYED_RELEASE_TABLET | Freq: Every day | ORAL | Status: DC
  Administered 2018-04-07 – 2018-04-10 (×4): 40 mg via ORAL
  Filled 2018-04-07 (×4): qty 1

## 2018-04-07 MED ORDER — METOCLOPRAMIDE HCL 5 MG PO TABS
5.0000 mg | ORAL_TABLET | Freq: Three times a day (TID) | ORAL | Status: DC
  Administered 2018-04-07 – 2018-04-10 (×13): 5 mg via ORAL
  Filled 2018-04-07 (×14): qty 1

## 2018-04-07 NOTE — Discharge Summary (Signed)
Physician Discharge Summary  Patient ID: Kevin Rojas MRN: [id number] DOB/AGE: 63-Apr-1957 63 y.o.  Admit date: 04/05/2018 Discharge date: 04/10/2018  Discharge Diagnoses:  Principal Problem:   Debility Active Problems:   Hypokalemia   Severe aortic insufficiency   Acute pulmonary embolism (HCC)   Constipation   Cognitive disorder   Dyspepsia --felt to be due to dysmotility   Discharged Condition:  Stable   Significant Diagnostic Studies: Dg Abd 1 View  Result Date: 04/07/2018 CLINICAL DATA:  Obstipation EXAM: ABDOMEN - 1 VIEW COMPARISON:  03/29/2018 FINDINGS: Nonobstructive bowel gas pattern. Gas and stool within nondistended large bowel. No obstruction, organomegaly or free air. IMPRESSION: No acute findings. Electronically Signed   By: Charlett Nose M.D.   On: 04/07/2018 09:39     Labs:  Basic Metabolic Panel: BMP Latest Ref Rng & Units 04/04/2018 04/03/2018 04/02/2018  Glucose 70 - 99 mg/dL 601(U) 88 -  BUN 8 - 23 mg/dL 12 14 -  Creatinine 9.32 - 1.24 mg/dL 3.55(D) 3.22(G) 2.54(Y)  Sodium 135 - 145 mmol/L 137 138 -  Potassium 3.5 - 5.1 mmol/L 3.6 3.7 -  Chloride 98 - 111 mmol/L 108 109 -  CO2 22 - 32 mmol/L 23 23 -  Calcium 8.9 - 10.3 mg/dL 7.0(W) 2.3(J) -    CBC: CBC Latest Ref Rng & Units 04/03/2018 04/02/2018 04/02/2018  WBC 4.0 - 10.5 K/uL 8.8 9.9 -  Hemoglobin 13.0 - 17.0 g/dL 12.4(L) 12.8(L) 11.2(L)  Hematocrit 39.0 - 52.0 % 38.2(L) 40.3 33.0(L)  Platelets 150 - 400 K/uL 264 276 -    CBG: No results for input(s): GLUCAP in the last 168 hours.  Brief HPI:   Kevin Rojas is a 63 year old male with history of L-PVD, renal calculi, recent URI symptoms who was admitted from home on 03/24/2018 with V. fib cardiac arrest.  He was treated with CPR x3 minutes by family as well as defibrillation by EMS.  He was intubated and treated with cooling protocol.  He was started on Unasyn for aspiration pneumonia.  2D echo done revealing EF of 40 to 45% with severe AI as well as  concerns of type a aortic dissection.  CTA chest was negative for PE, dissection of fluid overload.  Cardiac enzymes felt to be  positive due to NSTEMI felt to be due to demand ischemia.  He underwent cardiac cath revealing severe 4+ aortic regurgitation with mild aortic stenosis and dilated aortic root.  Plans for AVR as well as ICD in the future--he was fitted with LifeVest..  He was fitted with hospital course was significant for development of ileus requiring NG tube placement, as well as findings of nonocclusive subsegmental PE.  He was started on treatment dose Lovenox.  Therapy was ongoing and patient noted to be debilitated.  CIR was recommended due to functional decline   Hospital Course: Kevin Rojas was admitted to rehab 04/05/2018 for inpatient therapies to consist of PT, ST and OT at least three hours five days a week. Past admission physiatrist, therapy team and rehab RN have worked together to provide customized collaborative inpatient rehab.  He was maintained on treatment dose Lovenox for treatment of PE and due to anticipation of impending surgery. His respiratory status has been stable and no chest pain reported with increase in activity.  Blood pressures have been monitored on twice daily basis and have been stable.  Serial check of lytes revealed AKI is resolving and H&H is relatively stable.  P.o. intake has been good and he  is continent of bowel and bladder.  He was noted to have mild phlebitis right antecubital space it was treated with warm compresses.  He has had some complaints of nausea as well as bloating and fullness felt to be due to GI dysmotility.  Reglan was added and bowel program has been augmented to help with GI symptoms.  He made good gains during his rehab stay and was at modified independent level.  He was discharged to acute services on 04/10/18   Rehab course: During patient's stay in rehab team conference was were held to monitor patient's progress, set goals and  discuss barriers to discharge. At admission, patient required min assist with mobility and min assist with basic self-care task.  He exhibited bated deficits in short-term memory, recall, complex problem-solving and anticipatory awareness.  He  has had improvement in activity tolerance, balance, postural control as well as ability to compensate for deficits.  He is modified independent for transfers and is able to ambulate at thousand feet without use of assistive device.  He is able to complete ADL tasks at modified independent levels.  He is able attend to tasks and recall functional information independently. He is modified independent to supervision    Diet: Heart healthy.  Disposition: Acute hospital in anticipation of surgery   Allergies as of 04/10/2018   No Known Allergies     Medication List    TAKE these medications   acetaminophen 325 MG tablet Commonly known as:  TYLENOL Take 1-2 tablets (325-650 mg total) by mouth every 4 (four) hours as needed for mild pain.   enoxaparin 100 MG/ML injection Commonly known as:  LOVENOX Inject 0.95 mLs (95 mg total) into the skin daily.   metoCLOPramide 5 MG tablet Commonly known as:  REGLAN Take 1 tablet (5 mg total) by mouth 4 (four) times daily -  before meals and at bedtime.   pantoprazole 40 MG tablet Commonly known as:  PROTONIX Take 1 tablet (40 mg total) by mouth daily.   senna-docusate 8.6-50 MG tablet Commonly known as:  Senokot-S Take 1 tablet by mouth at bedtime as needed for mild constipation.      Follow-up Information    [name], MD .   Specialty:  Family Medicine Contact information: [phone] [address] [place] [address] [phone]        Tonny Bollman, MD .   Specialty:  Cardiology Contact information: [phone] [address] [place] [address] [phone]        Delight Ovens, MD Follow up.   Specialty:  Cardiothoracic Surgery Contact information: [address] [place] [address] [phone]        Ranelle Oyster, MD Follow up.   Specialty:  Physical Medicine and Rehabilitation Contact information: [address] [place] [address] [phone]           Signed: Jacquelynn Cree 04/10/2018, 10:54 AM

## 2018-04-07 NOTE — Plan of Care (Signed)
Resident performed pericare after toileting with staff stand by assistance. Encouraged to continue to ask for assistance by staff

## 2018-04-07 NOTE — Progress Notes (Signed)
Physical Therapy Session Note  Patient Details  Name: Kevin Rojas MRN: [id number] Date of Birth: 1955-04-10  Today's Date: 04/07/2018 PT Individual Time: 1305-1359 PT Individual Time Calculation (min): 54 min   Short Term Goals: Week 1:  PT Short Term Goal 1 (Week 1): STG=LTG due to ELOS  Skilled Therapeutic Interventions/Progress Updates:  Pt received in recliner & agreeable to tx. Pt completes sit<>stand transfers with mod I and ambulates around unit without AD & supervision. Pt brushed teeth standing at sink with distant supervision. Pt negotiates 12 steps (6") with R ascending rail and supervision with step-over-step pattern. Pt reports feeling dizzy & off balance at times since medical event occurred. Pt engaged in trampoline ball toss while standing on foam with normal and narrow BOS with supervision overall and no LOB. Pt carried cups stacked on board with supervision without LOB or dropping cups. Pt ambulated while balancing ball on racket with supervision with cuing to look up vs solely at ball/racket. Pt ambulates >200 ft at a time around unit with occasional cuing to not shuffle feet (pt appears to shuffle RLE more than LLE). Pt utilized cybex kinetron in sitting>standing with BUE support>standing without BUE support, with task focusing on BLE strengthening and balance. At end of session pt left sitting in recliner with chair pad alarm donned & all needs in reach.   At end of session: BP = 142/84 mmHg (LUE, sitting) HR = 68 bpm  Therapy Documentation Precautions:  Precautions Precautions: Fall, Other (comment) Precaution Comments: life vest Restrictions Weight Bearing Restrictions: No  Pain: No c/o pain.  Therapy/Group: Individual Therapy  Sandi Mariscal 04/07/2018, 2:32 PM

## 2018-04-07 NOTE — Progress Notes (Signed)
[facility] PHYSICAL MEDICINE & REHABILITATION PROGRESS NOTE   Subjective/Complaints: Still struggling with "gas" during meals. Solids more than liquids. Denies any burning/reflux symptoms  ROS: Patient denies fever, rash, sore throat, blurred vision, nausea, vomiting, diarrhea, cough, shortness of breath or chest pain, joint or back pain, headache, or mood change.   Objective:   No results found. No results for input(s): WBC, HGB, HCT, PLT in the last 72 hours. No results for input(s): NA, K, CL, CO2, GLUCOSE, BUN, CREATININE, CALCIUM in the last 72 hours.  Intake/Output Summary (Last 24 hours) at 04/07/2018 0844 Last data filed at 04/07/2018 0800 Gross per 24 hour  Intake 600 ml  Output -  Net 600 ml     Physical Exam: Vital Signs Blood pressure 129/79, pulse (!) 57, temperature 99.3 F (37.4 C), temperature source Oral, resp. rate 17, height 5\' 10"  (1.778 m), weight 64.9 kg, SpO2 99 %. Constitutional: No distress . Vital signs reviewed. HEENT: EOMI, oral membranes moist Neck: supple Cardiovascular: RRR w/ murmur. No JVD    Respiratory: CTA Bilaterally without wheezes or rales. Normal effort    GI: BS +, non-tender, non-distended  Extremities: No clubbing, cyanosis, or edema Skin: No evidence of breakdown, no evidence of rash Neurologic:alert and oriented x3. Motor 3-4/5 all 4's.cognitively appropriate.  Musculoskeletal:  Full ROM Psych: pleasant   Assessment/Plan: 1. Functional deficits secondary to debility which require 3+ hours per day of interdisciplinary therapy in a comprehensive inpatient rehab setting.  Physiatrist is providing close team supervision and 24 hour management of active medical problems listed below.  Physiatrist and rehab team continue to assess barriers to discharge/monitor patient progress toward functional and medical goals  Care Tool:  Bathing  Bathing activity did not occur: Safety/medical concerns           Bathing assist        Upper Body Dressing/Undressing Upper body dressing   What is the patient wearing?: Pull over shirt    Upper body assist Assist Level: Contact Guard/Touching assist    Lower Body Dressing/Undressing Lower body dressing      What is the patient wearing?: Underwear/pull up, Pants     Lower body assist Assist for lower body dressing: Minimal Assistance - Patient > 75%     Toileting Toileting    Toileting assist Assist for toileting: Contact Guard/Touching assist     Transfers Chair/bed transfer  Transfers assist     Chair/bed transfer assist level: Contact Guard/Touching assist     Locomotion Ambulation   Ambulation assist      Assist level: Contact Guard/Touching assist Assistive device: Other (comment)(none) Max distance: 175'   Walk 10 feet activity   Assist     Assist level: Contact Guard/Touching assist Assistive device: Other (comment)(none)   Walk 50 feet activity   Assist    Assist level: Contact Guard/Touching assist Assistive device: Other (comment)(none)    Walk 150 feet activity   Assist    Assist level: Contact Guard/Touching assist Assistive device: Other (comment)(none)    Walk 10 feet on uneven surface  activity   Assist     Assist level: Minimal Assistance - Patient > 75% Assistive device: Other (comment)(none)   Wheelchair     Assist Will patient use wheelchair at discharge?: No             Wheelchair 50 feet with 2 turns activity    Assist            Wheelchair 150 feet activity  Assist          Medical Problem List and Plan: 1. Debility secondary to v fib cardiac arrest   -Patient is beginning CIR therapies today including PT and OT   -after team conf/ELOS will follow up with Surgery re timing of TAVR  -should not remove life vest for showering.  2. Antithrombotics: -DVT/anticoagulation: Pharmaceutical: Lovenox mg/kg -antiplatelet therapy: none 3. Pain  Management: Acetaminophen 650mg  Q 6 h prn 4. Mood:  -antipsychotic agents: monitor for depression/adjustment issues 5. Neuropsych: This patient is capable of making decisions on his own behalf. 6. Skin/Wound Care: monitor for skin breakdown related to immobility and prolonged bedrest 7. Fluids/Electrolytes/Nutrition: monitor Is and Os, HDPD diet   -encourage PO   8. Bloating/constipation  -maalox for bloating  -protonix  -sorbitol today for constipation  -add reglan 5mg  tid b/f meals  -consider kub/barium swallow    LOS: 2 days A FACE TO FACE EVALUATION WAS PERFORMED  Ranelle Oyster 04/07/2018, 8:44 AM

## 2018-04-07 NOTE — Progress Notes (Signed)
Speech Language Pathology Discharge Summary  Patient Details  Name: Kevin Rojas MRN: [id number] Date of Birth: [birth year]  Today's Date: 04/07/2018 SLP Individual Time: 0815-0900 SLP Individual Time Calculation (min): 45 min   Skilled Therapeutic Interventions:   Skilled treatment session focused on cognitive goals. Patient was overall Mod I for recall of functional information in regards to life vest, upcoming surgery, etc. Patient demonstrated selective attention to all tasks in a minimally distracting environment with overall Mod I and demonstrated anticipatory awareness in regards to d/c planning after surgery. Patient reports he is at his baseline level of cognitive functioning and is overall Mod I, therefore, patient will be discharged from SLP intervention.   Patient has met 4 of 4 long term goals.  Patient to discharge at overall Modified Independent;Supervision level.   Reasons goals not met: N/A   Clinical Impression/Discharge Summary: Patient has made functional gains and has met 4 of 4 LTGs this admission. Currently, patient is overall supervision-Mod I for recall of functional information, selective attention, complex problem solving and anticipatory awareness in regards to upcoming surgery. Since patient appears at his overall baseline level of cognitive functioning and was admitted to CIR to maximize strength and conditioning prior to heart surgery, patient will be discharged from skilled SLP intervention. Patient and team in agreement. No f/u is warranted at this time.   Care Partner:  Caregiver Able to Provide Assistance: Yes     Recommendation:  None      Equipment: N/A   Reasons for discharge: Treatment goals met   Patient/Family Agrees with Progress Made and Goals Achieved: Yes    Zehra Rucci, [place] 04/07/2018, 4:13 PM

## 2018-04-07 NOTE — Progress Notes (Signed)
Occupational Therapy Session Note  Patient Details  Name: Kevin Rojas MRN: [id number] Date of Birth: 11/27/1955  Today's Date: 04/07/2018 OT Individual Time: 1008-1103 Session 2: 1600-1630 OT Individual Time Calculation (min): 55 min Session 2: 30 min   Short Term Goals: Week 1:  OT Short Term Goal 1 (Week 1): STG= LTG d/t ELOS  Skilled Therapeutic Interventions/Progress Updates:    Session focused on bathing/dressing tasks and functional activity tolerance/strengthening. Pt completed functional mobility around room and gathered clothes and supplies for sink bathing. Clarified bathing orders with MD who recommends no showers or removal of life vest. Pt able to complete all UB/LB bathing and dressing standing at sink with (S). Minimal cueing for awareness of lifevest and balance deficit awareness. Pt completed 150 ft of functional mobility to Dayroom with (S). Pt used Wii balance board to address dynamic standing balance. Pt completed varying levels of decreasing/increasing BOS on balance board with as much as min A provided when on single leg support. Pt returned to room and was left sitting up in recliner with chair pad set, all needs met.   Session 2: Session focused on building up cardiorespiratory endurance. Pt completed 200 ft of functional mobility to therapy gym. Pt completed functional stepping activity with focus on challenging dynamic standing balance with single leg stance, intermittently holding 2 7# weights. Vitals assessed throughout with no significant change in HR or SpO2. Pt then completed core stabilization exercises with a 2# medicine ball. Pt returned to room and left sitting up with chair pad set. No pain reported throughout session.   Therapy Documentation Precautions:  Precautions Precautions: Fall, Other (comment) Precaution Comments: life vest Restrictions Weight Bearing Restrictions: No Pain:   No pain reported    Therapy/Group: Individual Therapy  Curtis Sites 04/07/2018, 7:14 AM

## 2018-04-07 NOTE — Progress Notes (Signed)
Physical Therapy Session Note  Patient Details  Name: Kevin Rojas MRN: [id number] Date of Birth: 08/15/1955  Today's Date: 04/07/2018 PT Individual Time: 0950-1008 PT Individual Time Calculation (min): 18 min   Short Term Goals: Week 1:  PT Short Term Goal 1 (Week 1): STG=LTG due to ELOS  Skilled Therapeutic Interventions/Progress Updates:    Patient out to x-ray during session.  Seen upon return just prior to OT.  Supine in bed and agreeable to PT.  Supine to sit with S with elevated HOB.  Performed therex as noted below in room. Left seated EOB with OT in hall on the way into room.   Therapy Documentation Precautions:  Precautions Precautions: Fall, Other (comment) Precaution Comments: life vest Restrictions Weight Bearing Restrictions: No General: PT Amount of Missed Time (min): 12 Minutes PT Missed Treatment Reason: Xray Pain: Pain Assessment Pain Scale: 0-10 Pain Score: 0-No pain Exercises: General Exercises - Upper Extremity Shoulder Flexion: Strengthening;5 reps;Both;Seated;Theraband(punching forward, cues for core activation) Theraband Level (Shoulder Flexion): Level 3 (Green) Shoulder Horizontal ABduction: 10 reps;Seated;Strengthening;Both;Theraband(with cues for core activation) Theraband Level (Shoulder Horizontal Abduction): Level 3 (Green) General Exercises - Lower Extremity Hip ABduction/ADduction: 10 reps;Both;Strengthening;Standing(min UE support and alternating) Hip Flexion/Marching: Strengthening;Both;10 reps;Standing(with min UE support) Toe Raises: 20 reps;Both;Seated;Strengthening Heel Raises: 10 reps;Strengthening;Both;Standing(with UE support) Mini-Sqauts: 10 reps;Both;Strengthening;Standing(min UE support) Standing Knee Flexion: Both;10 reps;Strengthening(alternating with min UE support) Repetitive Sit to Stands: No upper extremities;10 reps Repetitive Sit to Stands Level of Assist: Other (comment)(supervision) Repetitive Sit to Stands Surface  Height: height of bed at lowest level   Therapy/Group: Co-Treatment  [employer], PT 04/07/2018, 11:20 AM

## 2018-04-08 ENCOUNTER — Inpatient Hospital Stay (HOSPITAL_COMMUNITY): Payer: BLUE CROSS/BLUE SHIELD | Admitting: Physical Therapy

## 2018-04-08 ENCOUNTER — Inpatient Hospital Stay (HOSPITAL_COMMUNITY): Payer: BLUE CROSS/BLUE SHIELD

## 2018-04-08 ENCOUNTER — Encounter (HOSPITAL_COMMUNITY): Payer: BLUE CROSS/BLUE SHIELD | Admitting: Psychology

## 2018-04-08 DIAGNOSIS — I351 Nonrheumatic aortic (valve) insufficiency: Secondary | ICD-10-CM

## 2018-04-08 DIAGNOSIS — F09 Unspecified mental disorder due to known physiological condition: Secondary | ICD-10-CM

## 2018-04-08 DIAGNOSIS — K224 Dyskinesia of esophagus: Secondary | ICD-10-CM

## 2018-04-08 NOTE — Progress Notes (Signed)
[place] PHYSICAL MEDICINE & REHABILITATION PROGRESS NOTE   Subjective/Complaints: Still struggling with "gas" during meals. Solids more than liquids. Denies any burning/reflux symptoms  ROS: Patient denies fever, rash, sore throat, blurred vision, nausea, vomiting, diarrhea, cough, shortness of breath or chest pain, joint or back pain, headache, or mood change.   Objective:   Dg Abd 1 View  Result Date: 04/07/2018 CLINICAL DATA:  Obstipation EXAM: ABDOMEN - 1 VIEW COMPARISON:  03/29/2018 FINDINGS: Nonobstructive bowel gas pattern. Gas and stool within nondistended large bowel. No obstruction, organomegaly or free air. IMPRESSION: No acute findings. Electronically Signed   By: Charlett Nose M.D.   On: 04/07/2018 09:39   No results for input(s): WBC, HGB, HCT, PLT in the last 72 hours. No results for input(s): NA, K, CL, CO2, GLUCOSE, BUN, CREATININE, CALCIUM in the last 72 hours.  Intake/Output Summary (Last 24 hours) at 04/08/2018 0840 Last data filed at 04/07/2018 2139 Gross per 24 hour  Intake 600 ml  Output 1 ml  Net 599 ml     Physical Exam: Vital Signs Blood pressure 133/81, pulse 64, temperature 98 F (36.7 C), temperature source Oral, resp. rate 19, height 5\' 10"  (1.778 m), weight 64.9 kg, SpO2 99 %. Constitutional: No distress . Vital signs reviewed. HEENT: EOMI, oral membranes moist Neck: supple Cardiovascular: RRR w/ murmur. No JVD    Respiratory: CTA Bilaterally without wheezes or rales. Normal effort    GI: BS +, non-tender, non-distended  Extremities: No clubbing, cyanosis, or edema Skin: No evidence of breakdown, no evidence of rash Neurologic:alert and oriented x3. Motor 3-4/5 all 4's.cognitively appropriate.  Musculoskeletal:  Full ROM Psych: pleasant   Assessment/Plan: 1. Functional deficits secondary to debility which require 3+ hours per day of interdisciplinary therapy in a comprehensive inpatient rehab setting.  Physiatrist is providing close team  supervision and 24 hour management of active medical problems listed below.  Physiatrist and rehab team continue to assess barriers to discharge/monitor patient progress toward functional and medical goals  Care Tool:  Bathing  Bathing activity did not occur: Safety/medical concerns Body parts bathed by patient: Right arm, Left arm, Abdomen, Chest, Right upper leg, Left upper leg, Right lower leg, Front perineal area, Left lower leg, Buttocks, Face         Bathing assist Assist Level: Supervision/Verbal cueing     Upper Body Dressing/Undressing Upper body dressing   What is the patient wearing?: Pull over shirt    Upper body assist Assist Level: Supervision/Verbal cueing    Lower Body Dressing/Undressing Lower body dressing      What is the patient wearing?: Underwear/pull up, Pants     Lower body assist Assist for lower body dressing: Supervision/Verbal cueing     Toileting Toileting    Toileting assist Assist for toileting: Supervision/Verbal cueing     Transfers Chair/bed transfer  Transfers assist     Chair/bed transfer assist level: Supervision/Verbal cueing     Locomotion Ambulation   Ambulation assist      Assist level: Supervision/Verbal cueing Assistive device: (none) Max distance: >150 ft    Walk 10 feet activity   Assist     Assist level: Supervision/Verbal cueing Assistive device: Other (comment)(none)   Walk 50 feet activity   Assist    Assist level: Supervision/Verbal cueing Assistive device: Other (comment)(none)    Walk 150 feet activity   Assist    Assist level: Supervision/Verbal cueing Assistive device: Other (comment)(none)    Walk 10 feet on uneven surface  activity   Assist     Assist level: Minimal Assistance - Patient > 75% Assistive device: Other (comment)(none)   Wheelchair     Assist Will patient use wheelchair at discharge?: No             Wheelchair 50 feet with 2 turns  activity    Assist            Wheelchair 150 feet activity     Assist          Medical Problem List and Plan: 1. Debility secondary to v fib cardiac arrest   --Continue CIR therapies including PT, OT    -spoke with Dr. Tyrone Sage re: timing of potential AVR. He will speak with cardiology. He is favoring waiting a little longer b/c of PE's   -should not remove life vest for showering.  2. Antithrombotics: -DVT,PE/anticoagulation: Pharmaceutical: Lovenox mg/kg -antiplatelet therapy: none 3. Pain Management: Acetaminophen 650mg  Q 6 h prn 4. Mood:  -antipsychotic agents: monitor for depression/adjustment issues 5. Neuropsych: This patient is capable of making decisions on his own behalf. 6. Skin/Wound Care: mild phlebitis right antecubital space.---warm compress 7. Fluids/Electrolytes/Nutrition: monitor Is and Os, HDPD diet   -encourage PO   8. GI: symptoms most c/w esophageal or GI dysmotility  -repsonded well to reglan  -small bites, chewing thoroughly, choosing right types of food  - continueprotonix  -still needs BM  -continue reglan 5mg  tid b/f meals  -consider barium swallow    LOS: 3 days A FACE TO FACE EVALUATION WAS PERFORMED  Ranelle Oyster 04/08/2018, 8:40 AM

## 2018-04-08 NOTE — Consult Note (Signed)
Neuropsychological Consultation   Patient:   Kevin Rojas   DOB:   25-Jan-1955  MR Number:  [id number]  Location:  [facility] [address] C[facility] [address] [id number] [place] [address] Dept: [phone] Loc: [phone]           Date of Service:   04/08/2018  Start Time:   8 AM End Time:   9 AM  Provider/Observer:  Arley Phenix, Psy.D.       Clinical Neuropsychologist       Billing Code/Service: 14481  Chief Complaint:    [name] is a 63 year old male with history of LBBB, renal calculi, recent URI symptoms who was admitted from home on 03/24/2018 with V. Fib cardiac arrest.  CPR was started by family and defib by EMS.  Intubated and treated with cooling protocol.  Head CT negative for acute changes with EEG showed generalized slowing.  Tolerated extubation.  Patient has been improving with cognition and strength.    Reason for Service:  The patient was referred for neuropsychological consultation due to concerns about cognitive function.  Below is the HPI for the current admission.    EHU:[id number] Kevin Rojas a 63 y.o.malewith history of LBBB, renal calculi, recent URI symptoms who was admitted from home on 03/24/2018 with V. fib cardiac arrest. He was treated with CPR x3 minutes by family as well as defibrillation by EMS. He was intubated and treated with cooling protocol. He was started on Unasyn for aspiration PNA. 2D echo showed EF 45-50% with severe AI as well as concerns of type A aortic dissection. CTA chest negative for PE, dissection or fluid overload. EKG without acute changes but cardiac enzymes positive due to NSTEMI felt to be due to demand ischemia. Cardiology recommends cath pending recovery. CT head negative for acute changes and EEG showed generalized slowing. He tolerated extubation on 03/28/18 and developed ileus withbilious emesis therefore NGT placed. Therapy evaluations revealed debility  and CIR recommended due to functional decline.  Current Status:  Cognitive function was assessed with the [facility] assessment, MMSE and extended clinical interview.  The patient was within normal limits for short term memory with mild impairments for auditory working memory.  The patient showed mild slowing of information processing speed and normal visual spatial abilities.  The patient was oriented x4.  I had extended discussion with the patient's wife.  She reprots that he is at his baseline as far as verbal fluency reporting that patient had always been very deliberate in expressive speed patterns.  Wife reports that from her perspective that patient is at baseline cognitively.      Behavioral Observation: [name]  presents as a 63 y.o.-year-old Right African American Male who appeared his stated age. his dress was Appropriate and he was Well Groomed and his manners were Appropriate to the situation.  his participation was indicative of Appropriate and Redirectable behaviors.  There were any physical disabilities noted.  he displayed an appropriate level of cooperation and motivation.     Interactions:    Active Appropriate and Redirectable  Attention:   abnormal and attention span appeared shorter than expected for age  Memory:   within normal limits; recent and remote memory intact  Visuo-spatial:  not examined  Speech (Volume):  low  Speech:   normal; A little slow in responding but wife reporting that he is at his normal pace.    Thought Process:  Coherent and Relevant  Though Content:  WNL; not suicidal and not homicidal  Orientation:   person, place, time/date and situation  Judgment:   Fair  Planning:   Fair  Affect:    Lethargic  Mood:    Dysphoric  Insight:   Present  Intelligence:   normal  Medical History:  History reviewed. No pertinent past medical history.  Psychiatric History:  Patient denies past psychiatric history.  Family Med/Psych  History:  Family History  Problem Relation Age of Onset  . Pulmonary embolism Mother   . Prostate cancer Father        passed awary at 76    Risk of Suicide/Violence: virtually non-existent   Impression/DX:  Kevin Rojas is a 63 year old male with history of LBBB, renal calculi, recent URI symptoms who was admitted from home on 03/24/2018 with V. Fib cardiac arrest.  CPR was started by family and defib by EMS.  Intubated and treated with cooling protocol.  Head CT negative for acute changes with EEG showed generalized slowing.  Tolerated extubation.  Patient has been improving with cognition and strength.  Cognitive function was assessed with the [facility] assessment, MMSE and extended clinical interview.  The patient was within normal limits for short term memory with mild impairments for auditory working memory.  The patient showed mild slowing of information processing speed and normal visual spatial abilities.  The patient was oriented x4.  I had extended discussion with the patient's wife.  She reprots that he is at his baseline as far as verbal fluency reporting that patient had always been very deliberate in expressive speed patterns.  Wife reports that from her perspective that patient is at baseline cognitively.         Disposition/Plan:  Will see again first of next week.  Diagnosis:   Debility        Electronically Signed   _______________________ Arley Phenix, Psy.D.

## 2018-04-08 NOTE — IPOC Note (Signed)
Overall Plan of Care [facility]) Patient Details Name: Jazarion Mcmannis MRN: [id number] DOB: July 20, 1955  Admitting Diagnosis: Debility  Hospital Problems: Principal Problem:   Debility Active Problems:   Hypokalemia   Aspiration pneumonia (HCC)   Severe aortic insufficiency   Acute pulmonary embolism (HCC)   Constipation   Cognitive disorder     Functional Problem List: Nursing Medication Management  PT Balance, Endurance, Motor, Safety  OT Balance, Cognition, Safety, Endurance  SLP Cognition  TR         Basic ADL's: OT Bathing, Dressing, Toileting     Advanced  ADL's: OT       Transfers: PT Bed Mobility, Bed to Chair, Car, Occupational psychologist, Research scientist (life sciences): PT Ambulation, Stairs     Additional Impairments: OT None  SLP Social Cognition   Problem Solving, Memory, Attention, Awareness  TR      Anticipated Outcomes Item Anticipated Outcome  Self Feeding no goal set  Swallowing      Basic self-care  mod I  Toileting  mod I   Bathroom Transfers mod I  Bowel/Bladder  mod I   Transfers  mod I   Locomotion  mod I  Communication     Cognition  Mod I  Pain  less than 3 out of 10  Safety/Judgment  independent   Therapy Plan: PT Intensity: Minimum of 1-2 x/day ,45 to 90 minutes PT Frequency: 5 out of 7 days PT Duration Estimated Length of Stay: 5-7 days OT Intensity: Minimum of 1-2 x/day, 45 to 90 minutes OT Frequency: 5 out of 7 days OT Duration/Estimated Length of Stay: 5-7 days SLP Intensity: Minumum of 1-2 x/day, 30 to 90 minutes SLP Frequency: 3 to 5 out of 7 days SLP Duration/Estimated Length of Stay: 5 to 7 days    Team Interventions: Nursing Interventions Discharge Planning, Medication Management  PT interventions Ambulation/gait training, Functional mobility training, Therapeutic Activities, Balance/vestibular training, Neuromuscular re-education, Therapeutic Exercise, Pain management, DME/adaptive equipment instruction,  Cognitive remediation/compensation, Functional electrical stimulation, Patient/family education, Stair training  OT Interventions Balance/vestibular training, Discharge planning, Self Care/advanced ADL retraining, Therapeutic Activities, UE/LE Coordination activities, Cognitive remediation/compensation, Functional mobility training, Disease mangement/prevention, Patient/family education, Therapeutic Exercise, Community reintegration, DME/adaptive equipment instruction, UE/LE Strength taining/ROM, Psychosocial support  SLP Interventions Cognitive remediation/compensation, Patient/family education, Functional tasks, Medication managment  TR Interventions    SW/CM Interventions Discharge Planning, Psychosocial Support, Patient/Family Education   Barriers to Discharge MD  Medical stability  Nursing      PT      OT      SLP      SW       Team Discharge Planning: Destination: PT-Home ,OT- Home , SLP-Home Projected Follow-up: PT-Home health PT, OT-  None, SLP-None Projected Equipment Needs: PT-None recommended by PT, OT- To be determined, SLP-None recommended by SLP Equipment Details: PT- , OT-  Patient/family involved in discharge planning: PT- Patient,  OT-Patient, SLP-Patient  MD ELOS: 5-7 days depending upon cardiac surgery plan Medical Rehab Prognosis:  Excellent Assessment: The patient has been admitted for CIR therapies with the diagnosis of debility due AS, multiple medical. The team will be addressing functional mobility, strength, stamina, balance, safety, adaptive techniques and equipment, self-care, bowel and bladder mgt, patient and caregiver education, stamina, balance, strength. Goals have been set at mod I for mobility and self-care. Dc pending plan from a surgical standpoint. Ranelle Oyster, MD, [facility]      See Team Conference  Notes for weekly updates to the plan of care

## 2018-04-08 NOTE — Plan of Care (Signed)
Due to the current state of emergency, patients may not be receiving their 3-hours of Medicare-mandated therapy.   

## 2018-04-08 NOTE — Progress Notes (Signed)
Physical Therapy Session Note  Patient Details  Name: Kevin Rojas MRN: [id number] Date of Birth: Apr 22, 1955  Today's Date: 04/08/2018 PT Individual Time: 0601-5615 and 3794-3276 PT Individual Time Calculation (min): 70 min and 56 min  Short Term Goals: Week 1:  PT Short Term Goal 1 (Week 1): STG=LTG due to ELOS  Skilled Therapeutic Interventions/Progress Updates:  Treatment 1: Pt received in recliner & agreeable to tx, denying c/o pain. Pt ambulates around unit without AD & mod I. Notified pt & LCSW Boneta Lucks) that pt is at goal level and does not need any DME for d/c. Pt negotiates 12 steps with R rail and mod I. Pt completes floor transfer x 2 with supervision fade to distant supervision with instructional cuing for technique. Pt completes car transfer at sedan simulated height with mod I, negotiates ramp & mulch without AD & mod I, and curb with mod I. Pt retrieves item from floor with mod I. Pt utilized [employer] Limits of Stability without BUE support and static surface with supervision progressing to moving surface with 1 UE support and CGA with task focusing on ankle righting reactions and postural control. Provided pt with OTAGO Level C exercises with pt completing all exercises with instructional cuing for technique. Educated pt on home modifications (remove rugs/any tripping hazards) to increase safety. Pt utilized cybex kinetron from sitting with task focusing on BLE strengthening with machine set a 10cm/sec resistance. Pt performs supine<>sitting in bed without rails and independence. Pt left in recliner with chair alarm donned & needs at hand.  Treatment 2: Pt received in recliner with PA (Pam) present & educating pt on plan to return to acute on Friday to await surgery. Pt ambulates around unit with independence and no AD. Pt engaged in [employer] game with task focusing on postural control and ankle righting reactions with supervision and no LOB noted. With 5# ankle weights  donned and standing with BUE support on parallel bars pt performed standing marches, hip abduction, and hamstring curls with instructional cuing for technique. Pt utilized nu-step on level 4 x 10 minutes with all four extremities with task focusing on global strengthening & endurance training. At end of session pt left sitting in recliner in room with all needs at hand & chair alarm set.   Therapy Documentation Precautions:  Precautions Precautions: Fall, Other (comment) Precaution Comments: life vest Restrictions Weight Bearing Restrictions: No    Therapy/Group: Individual Therapy  Sandi Mariscal 04/08/2018, 2:31 PM

## 2018-04-08 NOTE — Progress Notes (Signed)
Occupational Therapy Session Note  Patient Details  Name: Kevin Rojas MRN: [id number] Date of Birth: [birth year]  Today's Date: 04/08/2018 OT Individual Time: 1500-1600 OT Individual Time Calculation (min): 60 min    Short Term Goals: Week 1:  OT Short Term Goal 1 (Week 1): STG= LTG d/t ELOS  Skilled Therapeutic Interventions/Progress Updates:    1;1. Pt received in recliner with no c/o pain. Pt ambulates throughotu session with S. Pt completes standing balance, strengthening, endurance task of standign ball tosses (chest,b ounce and overhead pass) with semi squat in between each pass 3x30. Pt compeltes 3x 1 flight of stairs with standing rest breaks in between flights. Pt completes ambulation/dribbling of basketball while naming animals in alphabetical order. Pt demo decreased gait speed with multitasking. Pt completes 3x30 cycles standing at kinetron with BUE supported on handles. Pt sits at sink and OT applies water and rinses hair, pt scrubs shampoo into hair for washing. Pt blows hair dry in standing. Exited session with pt seated in recliner, call light in reach and all needs met  Therapy Documentation Precautions:  Precautions Precautions: Fall, Other (comment) Precaution Comments: life vest Restrictions Weight Bearing Restrictions: No General:   Vital Signs: Therapy Vitals Temp: 97.7 F (36.5 C) Temp Source: Oral Pulse Rate: 78 Resp: 18 BP: 127/86 Patient Position (if appropriate): Sitting Oxygen Therapy SpO2: 100 % O2 Device: Room Air Pain:   ADL: ADL Eating: Independent Where Assessed-Eating: Edge of bed Grooming: Supervision/safety Where Assessed-Grooming: Standing at sink Upper Body Bathing: Supervision/safety Where Assessed-Upper Body Bathing: Standing at sink Lower Body Bathing: Contact guard Where Assessed-Lower Body Bathing: Standing at sink Upper Body Dressing: Supervision/safety Where Assessed-Upper Body Dressing: Standing at sink Lower Body  Dressing: Contact guard, Minimal cueing Where Assessed-Lower Body Dressing: Standing at sink, Sitting at sink Toileting: Contact guard Where Assessed-Toileting: Glass blower/designer: Contact guard, Minimal verbal cueing Toilet Transfer Method: Counselling psychologist: Systems analyst    Praxis   Exercises:   Other Treatments:     Therapy/Group: Individual Therapy  Tonny Branch 04/08/2018, 4:02 PM

## 2018-04-08 NOTE — Progress Notes (Addendum)
[address]       [place] [address]             [phone]                     LOS: 3 days   Subjective: Patient improving, rehab helping   Objective: Vital signs in last 24 hours: Patient Vitals for the past 24 hrs:  BP Temp Temp src Pulse Resp SpO2  04/08/18 0531 133/81 98 F (36.7 C) Oral 64 19 99 %  04/07/18 1942 136/84 98 F (36.7 C) -- 71 19 99 %  04/07/18 1434 130/84 98.1 F (36.7 C) -- 71 (!) 22 100 %  04/07/18 1356 (!) 142/84 -- -- 68 -- --    Filed Weights   04/05/18 1600  Weight: 64.9 kg    Hemodynamic parameters for last 24 hours:    Intake/Output from previous day: 03/31 0701 - 04/01 0700 In: 780 [P.O.:780] Out: 1 [Stool:1] Intake/Output this shift: Total I/O In: 420 [P.O.:420] Out: -   Scheduled Meds:  enoxaparin (LOVENOX) injection  95 mg Subcutaneous Q24H   metoCLOPramide  5 mg Oral TID AC & HS   pantoprazole  40 mg Oral Daily   Continuous Infusions: PRN Meds:.acetaminophen, alum & mag hydroxide-simeth, Influenza vac split quadrivalent PF, senna-docusate, sorbitol, traZODone  General appearance: alert and cooperative Neurologic: intact Heart: regular rate and rhythm, S1, S2 normal, no murmur, click, rub or gallop Lungs: clear to auscultation bilaterally Abdomen: soft, non-tender; bowel sounds normal; no masses,  no organomegaly Extremities: extremities normal, atraumatic, no cyanosis or edema, Homans sign is negative, no sign of DVT and tender , right upper arm, not red  Lab Results: CBC:No results for input(s): WBC, HGB, HCT, PLT in the last 72 hours. BMET: No results for input(s): NA, K, CL, CO2, GLUCOSE, BUN, CREATININE, CALCIUM in the last 72 hours.  PT/INR: No results for input(s): LABPROT, INR in the last 72 hours.   Radiology Ct Angio Chest Pe W Or Wo Contrast  Result Date: 03/24/2018 CLINICAL DATA:  Cardiac arrest.  Intubated patient. EXAM: CT ANGIOGRAPHY CHEST WITH CONTRAST TECHNIQUE:  Multidetector CT imaging of the chest was performed using the standard protocol during bolus administration of intravenous contrast. Multiplanar CT image reconstructions and MIPs were obtained to evaluate the vascular anatomy. CONTRAST:  75mL OMNIPAQUE IOHEXOL 350 MG/ML SOLN COMPARISON:  Current chest radiographs. FINDINGS: Cardiovascular: There is satisfactory opacification of the pulmonary arteries to the segmental level. There is no evidence of a pulmonary embolism. Heart is mildly enlarged. No pericardial effusion. No coronary artery calcifications. Ascending aorta is dilated to 4.4 cm. Aorta is not opacified. No atherosclerotic calcifications. Mediastinum/Nodes: No neck base, no neck base or axillary masses or enlarged lymph nodes. Endotracheal tube and nasal/orogastric tube are well positioned. Subcentimeter shotty mediastinal lymph nodes. No discrete enlarged mediastinal or hilar lymph nodes. No masses. Trachea is unremarkable. Lungs/Pleura: Bilateral dependent lung consolidation. Airspace consolidation is noted in the posterior right upper lobe with a lesser degree of posterior consolidation in the right lower lobe. There is less extensive consolidation in the posterior left upper lobe and left lower lobe. No evidence of pulmonary edema. No pleural effusion and no pneumothorax. Upper Abdomen: No acute abnormality. Musculoskeletal: No chest wall abnormality. No acute or significant osseous findings. Review of the MIP images confirms the above findings. IMPRESSION: 1. No evidence of a pulmonary embolus. 2. Dependent consolidation in both lungs, right greater than  left. A significant portion of this is likely due to multifocal pneumonia. Some of this dependent opacity, particularly in the lower lobes, is likely atelectasis. 3. There is no evidence of pulmonary edema.  No pleural effusion. 4. Endotracheal and nasogastric tubes are well position. 5. Mild cardiomegaly. 6. Dilated ascending aorta to 4.2 cm.  Recommend annual imaging followup by CTA or MRA. This recommendation follows 2010 ACCF/AHA/AATS/ACR/ASA/SCA/SCAI/SIR/STS/SVM Guidelines for the Diagnosis and Management of Patients with Thoracic Aortic Disease. Circulation. 2010; 121: Y641-R830. Aortic aneurysm NOS (ICD10-I71.9) Aortic aneurysm NOS (ICD10-I71.9). Electronically Signed   By: Amie Portland M.D.   On: 03/24/2018 22:44  as  Register   On: 03/26/2018 06:46  Dg Chest Portable 1 View    Ct Angio Chest/abd/pel For Dissection W And/or W/wo  Result Date: 04/04/2018 CLINICAL DATA:  Cardiac arrest, severe aortic valvular insufficiency and aneurysmal dilatation of the ascending thoracic aorta by prior CTA of the chest timed for pulmonary artery evaluation. EXAM: CT ANGIOGRAPHY CHEST, ABDOMEN AND PELVIS TECHNIQUE: Multidetector CT imaging through the chest, abdomen and pelvis was performed using the standard protocol during bolus administration of intravenous contrast. Multiplanar reconstructed images and MIPs were obtained and reviewed to evaluate the vascular anatomy. CONTRAST:  OMNIPAQUE IOHEXOL 350 MG/ML SOLN COMPARISON:  CTA of the chest on 03/24/2018 FINDINGS: CTA CHEST FINDINGS Cardiovascular: The aortic root is mildly dilated and measures approximately 4.1-4.2 cm at the level of the sinuses of Valsalva. The aortic valve shows mild central calcification. The ascending thoracic aorta is mildly dilated and measures 4.2-4.3 cm in greatest diameter. The proximal arch measures approximately 3.3 cm. The distal arch demonstrates severe tortuosity and focal kinking prior to an area of mild dilatation measuring 3.2 cm. The descending thoracic aorta then tapers to a diameter of 2.8 cm. There is no evidence of aortic dissection. Proximal great vessels demonstrate normal branching anatomy and normal patency without aneurysmal disease. The heart is mildly enlarged. The left ventricle appears dilated. Trace anterior pericardial fluid without significant  pericardial effusion. No significant calcified coronary artery plaque is identified by CT. Although not optimized for pulmonary arterial evaluation, the pulmonary arteries are quite well opacified on the study and there is a new finding of definitive nonocclusive pulmonary embolism in the right lung with nonocclusive thrombus identified at a bifurcation of the right lower lobe pulmonary artery into segmental branches and within a right middle lobe pulmonary artery branch. No left-sided pulmonary embolism is identified. There is no evidence of central PE or saddle embolism. Mediastinum/Nodes: No enlarged mediastinal, hilar, or axillary lymph nodes. Thyroid gland, trachea, and esophagus demonstrate no significant findings. Lungs/Pleura: Mild scarring/atelectasis at both lung bases. There is no evidence of pulmonary edema, consolidation, pneumothorax, nodule or pleural fluid. No evidence of focal pulmonary infarction. Musculoskeletal: No chest wall abnormality. No acute or significant osseous findings. Review of the MIP images confirms the above findings. CTA ABDOMEN AND PELVIS FINDINGS VASCULAR Aorta: The abdominal aorta is normally patent and of normal caliber without evidence of aneurysm or dissection. No significant atherosclerosis. Celiac: Normally patent.  Normal branch vessel anatomy. SMA: Normally patent. Renals: A single right renal artery and 2 separate left renal arteries with small accessory upper pole artery demonstrate normal patency. IMA: Normally patent. Inflow: Bilateral iliac arteries demonstrate normal patency. Common femoral arteries and femoral bifurcations are widely patent. Review of the MIP images confirms the above findings. NON-VASCULAR Hepatobiliary: No focal liver abnormality is seen. No gallstones, gallbladder wall thickening, or biliary dilatation. Pancreas: Unremarkable. No pancreatic  ductal dilatation or surrounding inflammatory changes. Spleen: Normal in size without focal abnormality.  Adrenals/Urinary Tract: Adrenal glands are unremarkable. Kidneys are normal, without renal calculi, focal lesion, or hydronephrosis. Bladder is unremarkable. Stomach/Bowel: Bowel shows no evidence of obstruction, ileus or inflammation. No free air identified. Lymphatic: No enlarged lymph nodes identified. Reproductive: Prostate is unremarkable. Other: No hernias identified.  No free fluid or focal abscess. Musculoskeletal: Mild degenerative disc disease at L4-5 and L5-S1. Review of the MIP images confirms the above findings. IMPRESSION: 1. New, acute subsegmental nonocclusive pulmonary embolism in branches of the right lower lobe and right middle lobe pulmonary arteries. Overall volume of thrombus burden is low. This thrombus was not present on the CTA dated 03/24/2018. 2. Mild aneurysmal disease involving the aortic root and ascending thoracic aorta with the root measuring 4.2 cm in greatest diameter and the ascending thoracic aorta measuring 4.3 cm in greatest diameter. Associated partially calcified aortic valve. The aortic arch is very tortuous distally and demonstrates some degree of focal kinking due to tortuosity without evidence of significant stenosis. No evidence of aortic dissection. 3. Left ventricular cavity dilatation. 4. No evidence of aneurysmal disease or significant obstructive disease involving the abdominal aorta, iliac arteries, common femoral arteries or visceral branches of the abdominal aorta. These results were called by telephone at the time of interpretation on 04/04/2018 at 12:58 pm to Dr. Noralee Stain, who verbally acknowledged these results. Electronically Signed   By: Irish Lack M.D.   On: 04/04/2018 13:10   Vas US Doppler Pre Cabg  Result Date: 04/02/2018 PREOPERATIVE VASCULAR EVALUATION  Indications: Pre cabg. Performing Technologist: Olen Cordial Rvt  Examination Guidelines: A complete evaluation includes B-mode imaging, spectral Doppler, color Doppler, and power Doppler  as needed of all accessible portions of each vessel. Bilateral testing is considered an integral part of a complete examination. Limited examinations for reoccurring indications may be performed as noted.  Right Carotid Findings: +----------+--------+--------+--------+-----------+--------+             PSV cm/s EDV cm/s Stenosis Describe    Comments  +----------+--------+--------+--------+-----------+--------+  CCA Prox   91                         homogeneous           +----------+--------+--------+--------+-----------+--------+  CCA Distal 63       12                homogeneous           +----------+--------+--------+--------+-----------+--------+  ICA Prox   67       17       1-39%    homogeneous           +----------+--------+--------+--------+-----------+--------+  ICA Distal 69       17                                      +----------+--------+--------+--------+-----------+--------+  ECA        106                                              +----------+--------+--------+--------+-----------+--------+ +----------+--------+-------+--------+------------+             PSV cm/s EDV cms Describe Arm Pressure  +----------+--------+-------+--------+------------+  Subclavian 118                       119           +----------+--------+-------+--------+------------+ +---------+--------+--+--------+-+---------+  Vertebral PSV cm/s 51 EDV cm/s 8 Antegrade  +---------+--------+--+--------+-+---------+ Left Carotid Findings: +----------+--------+--------+--------+-----------+--------+             PSV cm/s EDV cm/s Stenosis Describe    Comments  +----------+--------+--------+--------+-----------+--------+  CCA Prox   87                         homogeneous           +----------+--------+--------+--------+-----------+--------+  CCA Distal 93                         homogeneous           +----------+--------+--------+--------+-----------+--------+  ICA Prox   68       12       1-39%    homogeneous            +----------+--------+--------+--------+-----------+--------+  ICA Distal 37       12                                      +----------+--------+--------+--------+-----------+--------+  ECA        83                                               +----------+--------+--------+--------+-----------+--------+ +----------+--------+--------+--------+------------+  Subclavian PSV cm/s EDV cm/s Describe Arm Pressure  +----------+--------+--------+--------+------------+             127                        127           +----------+--------+--------+--------+------------+ +---------+--------+--+--------+--+---------+  Vertebral PSV cm/s 49 EDV cm/s 13 Antegrade  +---------+--------+--+--------+--+---------+  ABI Findings: +--------+------------------+-----+---------+--------+  Right    Rt Pressure (mmHg) Index Waveform  Comment   +--------+------------------+-----+---------+--------+  Brachial 119                      triphasic           +--------+------------------+-----+---------+--------+  PTA      175                1.38  triphasic           +--------+------------------+-----+---------+--------+  DP       193                1.52  triphasic           +--------+------------------+-----+---------+--------+ +--------+------------------+-----+---------+-------+  Left     Lt Pressure (mmHg) Index Waveform  Comment  +--------+------------------+-----+---------+-------+  Brachial 127                      triphasic          +--------+------------------+-----+---------+-------+  PTA      180                1.42  triphasic          +--------+------------------+-----+---------+-------+  DP       197  1.55  triphasic          +--------+------------------+-----+---------+-------+ +-------+---------------+----------------+  ABI/TBI Today's ABI/TBI Previous ABI/TBI  +-------+---------------+----------------+  Right   1.52                              +-------+---------------+----------------+  Left    1.55                               +-------+---------------+----------------+  Right Doppler Findings: +-----------+--------+-----+---------+-----------------------------------------+  Site        Pressure Index Doppler   Comments                                   +-----------+--------+-----+---------+-----------------------------------------+  Brachial    119            triphasic                                            +-----------+--------+-----+---------+-----------------------------------------+  Radial                     triphasic                                            +-----------+--------+-----+---------+-----------------------------------------+  Ulnar                      triphasic                                            +-----------+--------+-----+---------+-----------------------------------------+  Palmar Arch                          Palmar waveforms are obliterated with                                            radial and ulnar compression.              +-----------+--------+-----+---------+-----------------------------------------+  Left Doppler Findings: +-----------+--------+-----+---------+-----------------------------------------+  Site        Pressure Index Doppler   Comments                                   +-----------+--------+-----+---------+-----------------------------------------+  Brachial    127            triphasic                                            +-----------+--------+-----+---------+-----------------------------------------+  Radial                     triphasic                                            +-----------+--------+-----+---------+-----------------------------------------+  Ulnar                      triphasic                                            +-----------+--------+-----+---------+-----------------------------------------+  Palmar Arch                          Palmar waveforms are obliterated with                                            radial and ulnar  compression.              +-----------+--------+-----+---------+-----------------------------------------+  Summary: Right Carotid: Velocities in the right ICA are consistent with a 1-39% stenosis. Left Carotid: Velocities in the left ICA are consistent with a 1-39% stenosis. Vertebrals: Bilateral vertebral arteries demonstrate antegrade flow. Right ABI: Resting right ankle-brachial index indicates noncompressible right lower extremity arteries. Left ABI: Resting left ankle-brachial index indicates noncompressible left lower extremity arteries.  Electronically signed by Sherald Hess MD on 04/02/2018 at 5:46:33 PM.    Final     Assessment/Plan:  1 Semi acute pe,-    New, acute subsegmental nonocclusive pulmonary embolism in branches of the right lower lobe and right middle lobe pulmonary arteries.  This thrombus was not present on the CTA dated 03/24/2018 2 right upper arm  thrombophlebitis  likely due  to antecubital iv site - warm soaks, watch for infection   Discussed with Dr Katrinka Blazing, home with life vest vs back  as inpatient and continue heparin, then proceed with avr, poss root replacement , wait for some time after recent pe , but clott burden is low.  Poss Tuesday April 6 . Cardiology to take back as inpatient Friday  Discussed with Dr Malva Cogan MD 04/08/2018 1:50 PM

## 2018-04-08 NOTE — Progress Notes (Signed)
Occupational Therapy Discharge Summary  Patient Details  Name: Kevin Rojas MRN: [id number] Date of Birth: Mar 15, 1955  Today's Date: 04/09/2018 OT Individual Time: 0700-0756 OT Individual Time Calculation (min): 56 min    Pt received in bed reporting no sleep. Pt agreeable to bathing and dressing and toileting this session. Pt completes all at MOD I level in standing for bathing, sit to stand from dressing and standing for voiding bladder. Pt able to gather clothing and carry conversation at same time with increased time d/t decreased divided attention. Pt ambulates to kitchen to make pot of decaf coffee with MOD I. Exited session with pt seated in recliner, call light nreach and all needs met   Patient has met 7 of 7 long term goals due to improved activity tolerance, improved balance, postural control, improved attention and improved awareness.  Patient to discharge at overall Modified Independent level.  Patient's care partner is independent to provide the necessary physical assistance at discharge.    Reasons goals not met: All treatment goals met  Recommendation:  Patient requires no further OT follow up   Equipment: No equipment provided  Reasons for discharge: treatment goals met  Patient/family agrees with progress made and goals achieved: Yes  OT Discharge Precautions/Restrictions  Precautions Precautions: Fall;Other (comment) Precaution Comments: life vest Restrictions Weight Bearing Restrictions: No   ADL ADL Eating: Independent Where Assessed-Eating: Edge of bed Grooming: Modified independent Where Assessed-Grooming: Standing at sink Upper Body Bathing: Modified independent Where Assessed-Upper Body Bathing: Standing at sink Lower Body Bathing: Modified independent Where Assessed-Lower Body Bathing: Standing at sink, Sitting at sink Upper Body Dressing: Modified independent (Device) Where Assessed-Upper Body Dressing: Standing at sink Lower Body Dressing:  Modified independent Where Assessed-Lower Body Dressing: Standing at sink, Sitting at sink Toileting: Modified independent Where Assessed-Toileting: Glass blower/designer: Diplomatic Services operational officer Method: Counselling psychologist: Energy manager: Unable to English as a second language teacher: Unable to assess Vision Baseline Vision/History: Wears glasses Wears Glasses: Reading only Patient Visual Report: No change from baseline Vision Assessment?: No apparent visual deficits Perception  Perception: Within Functional Limits Praxis Praxis: Intact Cognition Overall Cognitive Status: Within Functional Limits for tasks assessed Arousal/Alertness: Awake/alert Orientation Level: Oriented X4 Attention: Selective Selective Attention: Appears intact Memory: Appears intact Awareness: Appears intact Safety/Judgment: Appears intact Sensation Sensation Light Touch: Appears Intact Hot/Cold: Appears Intact Proprioception: Appears Intact Coordination Gross Motor Movements are Fluid and Coordinated: Yes Fine Motor Movements are Fluid and Coordinated: Yes Motor  Motor Motor: Within Functional Limits Mobility  Bed Mobility Bed Mobility: Supine to Sit;Rolling Right;Rolling Left;Sit to Supine Rolling Right: Independent Rolling Left: Independent Supine to Sit: Independent Sit to Supine: Independent Transfers Sit to Stand: Independent Stand to Sit: Independent  Trunk/Postural Assessment  Cervical Assessment Cervical Assessment: Within Functional Limits Thoracic Assessment Thoracic Assessment: Within Functional Limits Lumbar Assessment Lumbar Assessment: Within Functional Limits Postural Control Postural Control: Deficits on evaluation Righting Reactions: slightly delayed  Balance Balance Balance Assessed: Yes Static Sitting Balance Static Sitting - Balance Support: Feet supported Static Sitting - Level of Assistance: 6: Modified independent  (Device/Increase time) Dynamic Sitting Balance Dynamic Sitting - Balance Support: Feet supported Dynamic Sitting - Level of Assistance: 6: Modified independent (Device/Increase time) Dynamic Sitting - Balance Activities: Lateral lean/weight shifting Static Standing Balance Static Standing - Balance Support: During functional activity Static Standing - Level of Assistance: 6: Modified independent (Device/Increase time) Dynamic Standing Balance Dynamic Standing - Balance Support: During functional activity;No upper extremity supported Dynamic Standing - Level  of Assistance: 6: Modified independent (Device/Increase time) Extremity/Trunk Assessment RUE Assessment RUE Assessment: Within Functional Limits LUE Assessment LUE Assessment: Within Functional Limits   Curtis Sites 04/08/2018, 5:23 PM

## 2018-04-08 NOTE — Progress Notes (Signed)
Pt awake until early morning hours, then rested well. No c/o pain or otherwise. Life vest in place. No prn medications given.

## 2018-04-08 NOTE — Plan of Care (Signed)
Patient denies discomfort.

## 2018-04-08 NOTE — Progress Notes (Signed)
Physical Therapy Discharge Summary  Patient Details  Name: Kevin Rojas MRN: [id number] Date of Birth: 1955-03-31  Today's Date: 04/08/2018    Patient has met 11 of 11 long term goals due to improved activity tolerance, improved balance, improved postural control, increased strength, ability to compensate for deficits, improved awareness and improved coordination.  Patient to discharge at an ambulatory level mod I without AD.     Reasons goals not met: n/a  Recommendation:  Patient will benefit from ongoing skilled PT services in home health setting to continue to advance safe functional mobility, address ongoing impairments in strength, high level balance, and minimize fall risk.  Equipment: No equipment provided  Reasons for discharge: treatment goals met and discharge from hospital  Patient/family agrees with progress made and goals achieved: Yes  PT Discharge Precautions/Restrictions Precautions Precaution Comments: life vest Restrictions Weight Bearing Restrictions: No  Vision/Perception  Pt wears glasses at baseline for reading only. Pt denies any changes in baseline vision.  Perception WNL.  Cognition Overall Cognitive Status: Within Functional Limits for tasks assessed Arousal/Alertness: Awake/alert Orientation Level: Oriented X4 Selective Attention: Appears intact Memory: Appears intact Awareness: Appears intact Safety/Judgment: Appears intact  Sensation Sensation Light Touch: Appears Intact(denies numbness/tingling) Proprioception: Appears Intact Coordination Gross Motor Movements are Fluid and Coordinated: Yes Fine Motor Movements are Fluid and Coordinated: Yes  Motor  Motor Motor - Discharge Observations: generalized weakness   Mobility Bed Mobility Bed Mobility: Supine to Sit;Rolling Right;Rolling Left;Sit to Supine Rolling Right: Independent Rolling Left: Independent Supine to Sit: Independent Sit to Supine: Independent Transfers Transfers:  Sit to Stand;Stand to Sit Sit to Stand: Independent Stand to Sit: Independent   Locomotion  Gait Ambulation: Yes Gait Assistance: Independent Gait Distance (Feet): 150 Feet Assistive device: None Gait Gait: No Gait Pattern: Impaired Gait Pattern: (decreased gait speed, decreased dorsiflexion & decreased hip flexion RLE during swing phase) Gait velocity: Decreased Stairs / Additional Locomotion Stairs: Yes Stairs Assistance: Independent with assistive device Stair Management Technique: One rail Right Number of Stairs: 12 Height of Stairs: 6(inches) Ramp: Independent(ambulatory without AD) Curb: Independent(without AD) Wheelchair Mobility Wheelchair Mobility: No   Trunk/Postural Assessment  Cervical Assessment Cervical Assessment: Within Functional Limits Thoracic Assessment Thoracic Assessment: Within Functional Limits Lumbar Assessment Lumbar Assessment: Within Functional Limits Postural Control Postural Control: Deficits on evaluation Righting Reactions: slightly delayed   Balance Balance Balance Assessed: Yes Dynamic Standing Balance Dynamic Standing - Balance Support: During functional activity;No upper extremity supported Dynamic Standing - Level of Assistance: 7: Independent   04/06/2018 Berg Balance Test = 49/56  Extremity Assessment  All extremities WFL.   Lavone Nian, PT, DPT 04/08/2018, 1:36 PM  Waunita Schooner 04/08/2018, 11:16 AM

## 2018-04-09 ENCOUNTER — Inpatient Hospital Stay (HOSPITAL_COMMUNITY): Payer: BLUE CROSS/BLUE SHIELD

## 2018-04-09 ENCOUNTER — Inpatient Hospital Stay (HOSPITAL_COMMUNITY): Payer: BLUE CROSS/BLUE SHIELD | Admitting: Occupational Therapy

## 2018-04-09 ENCOUNTER — Inpatient Hospital Stay (HOSPITAL_COMMUNITY): Payer: BLUE CROSS/BLUE SHIELD | Admitting: Physical Therapy

## 2018-04-09 NOTE — Progress Notes (Signed)
Physical Therapy Session Note  Patient Details  Name: Kevin Rojas MRN: [id number] Date of Birth: Jan 02, 1956  Today's Date: 04/09/2018 PT Individual Time: 1120-1220 PT Individual Time Calculation (min): 60 min   Short Term Goals: Week 1:  PT Short Term Goal 1 (Week 1): STG=LTG due to ELOS  Skilled Therapeutic Interventions/Progress Updates:   pt rec'd in bed, agreeable to therapy.  Pt made mod I in room and was educated on what this means, pt verbalizes understanding. Pt performs gait throughout unit with mod I without AD.  6 MWT 587 feet household ambulator.  Pt performs [employer] for strength and balance training with visual and verbal cues for technique.  Pt performs standing ball toss with no LOB during static stance.  Ball toss while walking fwd and bkwd with good balance reactions.  Ball dribble while walking with good correction of LOB without assist.  Pt performs nustep with LEs only x 6 minutes level 4 for continued strength and endurance training. Pt left in room with all needs at hand, RN aware of Mod I status.  Therapy Documentation Precautions:  Precautions Precautions: Fall, Other (comment) Precaution Comments: life vest Restrictions Weight Bearing Restrictions: No Pain: Pain Assessment Pain Scale: 0-10 Pain Score: 0-No pain    Therapy/Group: Individual Therapy  Neco Kling 04/09/2018, 12:03 PM

## 2018-04-09 NOTE — Progress Notes (Signed)
      [address]       [place] [address]             [phone]            Subjective: Patient about to eat breakfast. He states his right upper arm is sore  Objective: Vital signs in last 24 hours: Temp:  [97.7 F (36.5 C)-98.3 F (36.8 C)] 97.8 F (36.6 C) (04/02 0355) Pulse Rate:  [74-81] 81 (04/02 0355) Resp:  [18-19] 19 (04/02 0355) BP: (121-139)/(79-86) 139/79 (04/02 0355) SpO2:  [98 %-100 %] 98 % (04/02 0355)   Current Weight  04/05/18 64.9 kg   Intake/Output from previous day: 04/01 0701 - 04/02 0700 In: 900 [P.O.:900] Out: -    Physical Exam:  Cardiovascular: RRR Pulmonary: Clear to auscultation bilaterally Abdomen: Soft, non tender, bowel sounds present. Extremities: Nol lower extremity edema. RUE warm and has some bruising   Lab Results: CBC:No results for input(s): WBC, HGB, HCT, PLT in the last 72 hours. BMET: No results for input(s): NA, K, CL, CO2, GLUCOSE, BUN, CREATININE, CALCIUM in the last 72 hours.  PT/INR:  Lab Results  Component Value Date   INR 1.3 (H) 03/25/2018   INR 1.3 (H) 03/25/2018   INR 1.2 03/24/2018   ABG:  INR: Will add last result for INR, ABG once components are confirmed Will add last 4 CBG results once components are confirmed  Assessment/Plan:  1. CV - S/p out of hospital cardiac arrest 03/17. Continues to maintain SR. Will need AVR, hopefully next week. 2.  Pulmonary - On room air. 3. Last creatinine 1.25 on 03/28 (1.5 upon admission) 4. Phlebitis of the right upper arm-last WBC 8,800 5. Subsegmental PE of RLL and RML-on Lovenox  Garl Speigner M ZimmermanPA-C 04/09/2018,7:50 AM [phone]

## 2018-04-09 NOTE — Progress Notes (Signed)
Social Work Assessment and Plan  Patient Details  Name: Kevin Rojas MRN: [id number] Date of Birth: 1955-07-21  Today's Date: 04/08/2018  Problem List:  Patient Active Problem List   Diagnosis Date Noted  . Cognitive disorder   . Constipation 04/07/2018  . Debility 04/05/2018  . Acute pulmonary embolism ([place]) 04/05/2018  . Acute respiratory failure with hypoxia ([place])   . Aspiration into airway   . Severe aortic insufficiency 03/26/2018  . Cardiac arrest ([place]) 03/24/2018  . LBBB (left bundle branch block) 03/24/2018  . Elevated troponin 03/24/2018  . Hypokalemia 03/24/2018  . Endotracheally intubated 03/24/2018  . Abnormal CXR 03/24/2018  . Aspiration pneumonia ([place]) 03/24/2018   Past Medical History: History reviewed. No pertinent past medical history. Past Surgical History:  Past Surgical History:  Procedure Laterality Date  . AORTIC ARCH ANGIOGRAPHY N/A 04/02/2018   Procedure: AORTIC ARCH ANGIOGRAPHY;  Surgeon: Troy Sine, MD;  Location: [facility];  Service: Cardiovascular;  Laterality: N/A;  . RIGHT/LEFT HEART CATH AND CORONARY ANGIOGRAPHY N/A 04/02/2018   Procedure: RIGHT/LEFT HEART CATH AND CORONARY ANGIOGRAPHY;  Surgeon: Troy Sine, MD;  Location: [facility];  Service: Cardiovascular;  Laterality: N/A;   Social History:  reports that he has never smoked. He has never used smokeless tobacco. He reports that he does not drink alcohol or use drugs.  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 Legal History/Current Legal Issues: none reported Guardian/Conservator: N/A - MD has determined that pt is capable of making his own decisions.   Abuse/Neglect Abuse/Neglect Assessment Can Be Completed: Yes Physical Abuse: Denies Verbal Abuse: Denies Sexual Abuse: Denies Exploitation of patient/patient's resources: Denies Self-Neglect: Denies  Emotional Status Pt's affect, behavior and adjustment status: Pt is upbeat and positive and upbeat about his recovery and rehab and is prepared for upcoming surgery. Recent Psychosocial Issues: Pt reports he plans to slow down after his surgery and enjoy his life and family more. Psychiatric History: none reported Substance Abuse History: none reported  Patient / Family Perceptions, Expectations & Goals Pt/Family understanding of illness & functional limitations: Pt/family have a good understanding of pt's condition and limitations. Premorbid pt/family roles/activities: Pt works a lot, likes to visit with family/friends, and to go fishing. Anticipated changes in roles/activities/participation: Pt would like to resume all activities as he is able. Pt/family expectations/goals: Pt wants to recover from his upcoming heart surgery and then take his wife fishing.  Community Resources Express Scripts: None Premorbid Home Care/DME Agencies: None Transportation available at discharge: family Resource referrals recommended: Neuropsychology  Discharge Planning Living Arrangements: Spouse/significant other Support Systems: Spouse/significant other, Children, Other relatives, Friends/neighbors, Immunologist,  Other (Comment)(co-workers) Type of Residence: Private residence Administrator, sports: Multimedia programmer (specify)([employer] [place]) Financial Resources: Employment Financial Screen Referred: No Money Management:  Patient Does the patient have any problems obtaining your medications?: No Home Management: Pt/wife share these responsibilities. Patient/Family Preliminary Plans: Pt to retutrn to acute hospital to prepare for heart surgery and then will return to his home where his family will care for pt at home with [facility] therapies. Social Work Anticipated Follow Up Needs: HH/OP Expected length of stay: ELOS 7 to 10 days then to OR for Valve surgery  Clinical Impression CSW met with pt a couple of times over the week for brief meetings, but finally got to talk to pt at length to introduce self and role of CSW as well as to complete assessment.  Pt's disposition plan changed several times over the week as he improved in function and neared his targeted goals.  Therapy team targeted goals would be met by 04-09-18, so Reesa Chew, PA and Dr. Naaman Plummer began working on pt's plan home vs. Transfer to acute hospital for AVR.  It was finally decided that pt would tx to acute on 04-10-18, await surgery, and likely go home from there with [facility] therapies.  Pt does not require any DME.  Pt remains upbeat and positive about his situation/condition and is motivated to get home to his family.  Pt coping well and very appreciative of CIR care.  CSW will continue to follow pt through his brief CIR stay and assist as needed.  Hindy Perrault, Silvestre Mesi 04/09/2018, 11:14 PM

## 2018-04-09 NOTE — Progress Notes (Signed)
Pt reports not sleeping well throughout the night. Pt declined sleep aid at HS, states he will probably need one tonight. Will report off to oncoming shift.

## 2018-04-09 NOTE — Progress Notes (Signed)
Physical Therapy Session Note  Patient Details  Name: Kevin Rojas MRN: [id number] Date of Birth: [birth year]  Today's Date: 04/09/2018 PT Individual Time: 0915-1030 PT Individual Time Calculation (min): 75 min   Short Term Goals: Week 1:  PT Short Term Goal 1 (Week 1): STG=LTG due to ELOS  Skilled Therapeutic Interventions/Progress Updates:   Patient in recliner and reports did not sleep overnight.  Otherwise feels okay.  Able to perform mobility tasks including gait 220', stairs x 12 with R rail, transfers sit<> stand, bed mobility, furniture transfers & car transfers all I to mod I (stairs).  Educated pt on what to expect with sternal precautions after AVR and he performed sit<>supine without UE use.  Patient discussed concerns about returning to activities and educated in timing for healing and general usual restrictions, but to check with MD. Repeated Berg balance assessment and pt scored 53/56. Returned to pt's room and patient performed HEP issued by primary PT yesterday with min cues and S.  Patient requesting to return to bed to attempt to rest prior to next session.  Signed off for I in room on safety plan. Toileted on his own and then to supine. Left in supine with call button in reach.   Therapy Documentation Precautions:  Precautions Precautions: Fall, Other (comment) Precaution Comments: life vest Restrictions Weight Bearing Restrictions: No Pain: Pain Assessment Pain Score: 0-No pain  Balance: Standardized Balance Assessment Standardized Balance Assessment: Berg Balance Test Berg Balance Test Sit to Stand: Able to stand without using hands and stabilize independently Standing Unsupported: Able to stand safely 2 minutes Sitting with Back Unsupported but Feet Supported on Floor or Stool: Able to sit safely and securely 2 minutes Stand to Sit: Sits safely with minimal use of hands Transfers: Able to transfer safely, minor use of hands Standing Unsupported with Eyes  Closed: Able to stand 10 seconds safely Standing Ubsupported with Feet Together: Able to place feet together independently and stand 1 minute safely From Standing, Reach Forward with Outstretched Arm: Can reach confidently >25 cm (10") From Standing Position, Pick up Object from Floor: Able to pick up shoe safely and easily From Standing Position, Turn to Look Behind Over each Shoulder: Looks behind from both sides and weight shifts well Turn 360 Degrees: Able to turn 360 degrees safely but slowly Standing Unsupported, Alternately Place Feet on Step/Stool: Able to stand independently and safely and complete 8 steps in 20 seconds Standing Unsupported, One Foot in Front: Able to place foot tandem independently and hold 30 seconds Standing on One Leg: Able to lift leg independently and hold 5-10 seconds Total Score: 53    Therapy/Group: Individual Therapy  Elray Mcgregor  [place], PT 04/09/2018, 1:16 PM

## 2018-04-09 NOTE — Progress Notes (Signed)
[facility] PHYSICAL MEDICINE & REHABILITATION PROGRESS NOTE   Subjective/Complaints: Feeling well. Stamina improving  ROS: Patient denies fever, rash, sore throat, blurred vision, nausea, vomiting, diarrhea, cough, shortness of breath or chest pain, joint or back pain, headache, or mood change.   Objective:   Dg Abd 1 View  Result Date: 04/07/2018 CLINICAL DATA:  Obstipation EXAM: ABDOMEN - 1 VIEW COMPARISON:  03/29/2018 FINDINGS: Nonobstructive bowel gas pattern. Gas and stool within nondistended large bowel. No obstruction, organomegaly or free air. IMPRESSION: No acute findings. Electronically Signed   By: Charlett Nose M.D.   On: 04/07/2018 09:39   No results for input(s): WBC, HGB, HCT, PLT in the last 72 hours. No results for input(s): NA, K, CL, CO2, GLUCOSE, BUN, CREATININE, CALCIUM in the last 72 hours.  Intake/Output Summary (Last 24 hours) at 04/09/2018 0918 Last data filed at 04/09/2018 0850 Gross per 24 hour  Intake 840 ml  Output -  Net 840 ml     Physical Exam: Vital Signs Blood pressure 139/79, pulse 81, temperature 97.8 F (36.6 C), resp. rate 19, height 5\' 10"  (1.778 m), weight 64.9 kg, SpO2 98 %. Constitutional: No distress . Vital signs reviewed. HEENT: EOMI, oral membranes moist Neck: supple Cardiovascular: RRR without murmur. No JVD    Respiratory: CTA Bilaterally without wheezes or rales. Normal effort    GI: BS +, non-tender, non-distended  Extremities: No clubbing, cyanosis, or edema Skin: mild tenderness in right antecub fossa Neurologic:alert and oriented x3. Motor 3-4/5 all 4's.cognitively appropriate.  Musculoskeletal:  Full ROM Psych: pleasant   Assessment/Plan: 1. Functional deficits secondary to debility which require 3+ hours per day of interdisciplinary therapy in a comprehensive inpatient rehab setting.  Physiatrist is providing close team supervision and 24 hour management of active medical problems listed below.  Physiatrist and rehab  team continue to assess barriers to discharge/monitor patient progress toward functional and medical goals  Care Tool:  Bathing  Bathing activity did not occur: Safety/medical concerns Body parts bathed by patient: Right arm, Left arm, Abdomen, Chest, Right upper leg, Left upper leg, Right lower leg, Front perineal area, Left lower leg, Buttocks, Face         Bathing assist Assist Level: Independent with assistive device     Upper Body Dressing/Undressing Upper body dressing   What is the patient wearing?: Pull over shirt    Upper body assist Assist Level: Independent with assistive device    Lower Body Dressing/Undressing Lower body dressing      What is the patient wearing?: Underwear/pull up, Pants     Lower body assist Assist for lower body dressing: Independent with assitive device     Toileting Toileting    Toileting assist Assist for toileting: Supervision/Verbal cueing     Transfers Chair/bed transfer  Transfers assist     Chair/bed transfer assist level: Independent     Locomotion Ambulation   Ambulation assist      Assist level: Independent Assistive device: (none) Max distance: >150 ft    Walk 10 feet activity   Assist     Assist level: Independent Assistive device: Other (comment)(none)   Walk 50 feet activity   Assist    Assist level: Independent Assistive device: Other (comment)(none)    Walk 150 feet activity   Assist    Assist level: Independent Assistive device: Other (comment)(none)    Walk 10 feet on uneven surface  activity   Assist     Assist level: Independent Assistive device: Other (comment)(none)  Wheelchair     Assist Will patient use wheelchair at discharge?: No   Wheelchair activity did not occur: N/A(pt to d/c at ambulatory level)         Wheelchair 50 feet with 2 turns activity    Assist    Wheelchair 50 feet with 2 turns activity did not occur: N/A       Wheelchair  150 feet activity     Assist Wheelchair 150 feet activity did not occur: N/A        Medical Problem List and Plan: 1. Debility secondary to v fib cardiac arrest   --Continue CIR therapies including PT, OT    -plan is for return to acute tomorrow in expectation of AVR/pacer  -should not remove life vest for showering.  2. Antithrombotics: -DVT,PE/anticoagulation: Pharmaceutical: Lovenox mg/kg -antiplatelet therapy: none 3. Pain Management: Acetaminophen 650mg  Q 6 h prn 4. Mood:  -antipsychotic agents: monitor for depression/adjustment issues 5. Neuropsych: This patient is capable of making decisions on his own behalf. 6. Skin/Wound Care: mild phlebitis right antecubital space.---warm compress 7. Fluids/Electrolytes/Nutrition: monitor Is and Os, HDPD diet   -encourage PO   8. GI:  symptoms most c/w esophageal or GI dysmotility  -has repsonded well to reglan  -small bites, chewing thoroughly, choosing right types of food  - continueprotonix  - continue reglan 5mg  tid b/f meals  - had bm yesterday   LOS: 4 days A FACE TO FACE EVALUATION WAS PERFORMED  Ranelle Oyster 04/09/2018, 9:18 AM

## 2018-04-09 NOTE — Progress Notes (Signed)
Occupational Therapy Session Note  Patient Details  Name: Delno Irias MRN: [id number] Date of Birth: Nov 30, 1955  Today's Date: 04/09/2018 OT Individual Time: 1415-1500 OT Individual Time Calculation (min): 45 min    Short Term Goals: Week 1:  OT Short Term Goal 1 (Week 1): STG= LTG d/t ELOS  Skilled Therapeutic Interventions/Progress Updates:    Upon entering the room, pt seated in recliner chair awaiting OT arrival. Pt verbalized feeling much more confident with upcoming procedure. Pt requesting to go outside as he has not been out since this admission. Pt ambulating 1000' without use of AD at mod I level over a variety of surfaces and obstacles without issue. Pt taking seated rest break outside and OT providing education regarding energy conservation and fall risks after discharge from hospital. Pt very receptive and verbalized understanding. Call bell and all needed items within reach upon exiting the room.   Therapy Documentation Precautions:  Precautions Precautions: Fall, Other (comment) Precaution Comments: life vest Restrictions Weight Bearing Restrictions: No General:   Vital Signs: Therapy Vitals Temp: 98.4 F (36.9 C) Temp Source: Oral Pulse Rate: 75 Resp: 20 BP: 117/70 Patient Position (if appropriate): Sitting Oxygen Therapy SpO2: 99 % O2 Device: Room Air Pain: Pain Assessment Pain Score: 0-No pain ADL: ADL Eating: Independent Where Assessed-Eating: Edge of bed Grooming: Modified independent Where Assessed-Grooming: Standing at sink Upper Body Bathing: Modified independent Where Assessed-Upper Body Bathing: Standing at sink Lower Body Bathing: Modified independent Where Assessed-Lower Body Bathing: Standing at sink, Sitting at sink Upper Body Dressing: Modified independent (Device) Where Assessed-Upper Body Dressing: Standing at sink Lower Body Dressing: Modified independent Where Assessed-Lower Body Dressing: Standing at sink, Sitting at  sink Toileting: Modified independent Where Assessed-Toileting: Teacher, adult education: Engineer, agricultural Method: Proofreader: Grab bars Tub/Shower Transfer: Unable to assess [employer] Transfer: Unable to assess   Therapy/Group: Individual Therapy  Alen Bleacher 04/09/2018, 4:01 PM

## 2018-04-09 NOTE — Progress Notes (Signed)
[facility] Individual Statement of Services  Patient Name:  Kevin Rojas  Date:  04/09/2018  Welcome to the [facility].  Our goal is to provide you with an individualized program based on your diagnosis and situation, designed to meet your specific needs.  With this comprehensive rehabilitation program, you will be expected to participate in at least 3 hours of rehabilitation therapies Monday-Friday, with modified therapy programming on the weekends.  Your rehabilitation program will include the following services:  Physical Therapy (PT), Occupational Therapy (OT), Speech Therapy (ST), 24 hour per day rehabilitation nursing, Neuropsychology, Case Management (Social Worker), Rehabilitation Medicine, Nutrition Services and Pharmacy Services  Weekly team conferences will be held on Tuesdays to discuss your progress.  Your Social Worker will talk with you frequently to get your input and to update you on team discussions.  Team conferences with you and your family in attendance may also be held.  Expected length of stay: 5 to 7 days  Overall anticipated outcome:  Independent  Depending on your progress and recovery, your program may change. Your Social Worker will coordinate services and will keep you informed of any changes. Your Social Worker's name and contact numbers are listed  below.  The following services may also be recommended but are not provided by the [facility]patient [facility]:   Driving Evaluations  Home Health Rehabiltiation Services  Outpatient Rehabilitation Services  Vocational Rehabilitation   Arrangements will be made to provide these services after discharge if needed.  Arrangements include referral to agencies that provide these services.  Your insurance has been verified to be:  [employer] [place] Your primary doctor is:  Dr. [name]  Pertinent information will be shared with your doctor and your  insurance company.  Social Worker:  Staci Acosta, LCSW  [phone] or [phone]  Information discussed with and copy given to patient by: Elvera Lennox, 04/09/2018, 11:20 PM

## 2018-04-10 ENCOUNTER — Inpatient Hospital Stay (HOSPITAL_COMMUNITY)
Admission: AD | Admit: 2018-04-10 | Discharge: 2018-04-13 | DRG: 307 | Disposition: A | Discharge status: Home / Self Care | Payer: BLUE CROSS/BLUE SHIELD | Source: Ambulatory Visit | Attending: Cardiology | Admitting: Cardiology

## 2018-04-10 DIAGNOSIS — I77810 Thoracic aortic ectasia: Secondary | ICD-10-CM | POA: Diagnosis not present

## 2018-04-10 DIAGNOSIS — Z8674 Personal history of sudden cardiac arrest: Secondary | ICD-10-CM | POA: Diagnosis not present

## 2018-04-10 DIAGNOSIS — I4901 Ventricular fibrillation: Secondary | ICD-10-CM | POA: Diagnosis not present

## 2018-04-10 DIAGNOSIS — R1013 Epigastric pain: Secondary | ICD-10-CM | POA: Insufficient documentation

## 2018-04-10 DIAGNOSIS — Z79899 Other long term (current) drug therapy: Secondary | ICD-10-CM

## 2018-04-10 DIAGNOSIS — Z01810 Encounter for preprocedural cardiovascular examination: Secondary | ICD-10-CM | POA: Diagnosis not present

## 2018-04-10 DIAGNOSIS — I2694 Multiple subsegmental pulmonary emboli without acute cor pulmonale: Secondary | ICD-10-CM | POA: Diagnosis not present

## 2018-04-10 DIAGNOSIS — E876 Hypokalemia: Secondary | ICD-10-CM | POA: Diagnosis not present

## 2018-04-10 DIAGNOSIS — I469 Cardiac arrest, cause unspecified: Secondary | ICD-10-CM | POA: Diagnosis not present

## 2018-04-10 DIAGNOSIS — R0602 Shortness of breath: Secondary | ICD-10-CM

## 2018-04-10 DIAGNOSIS — I472 Ventricular tachycardia: Secondary | ICD-10-CM | POA: Diagnosis not present

## 2018-04-10 DIAGNOSIS — I351 Nonrheumatic aortic (valve) insufficiency: Secondary | ICD-10-CM | POA: Diagnosis not present

## 2018-04-10 DIAGNOSIS — Z86711 Personal history of pulmonary embolism: Secondary | ICD-10-CM | POA: Diagnosis not present

## 2018-04-10 DIAGNOSIS — I2699 Other pulmonary embolism without acute cor pulmonale: Secondary | ICD-10-CM | POA: Diagnosis present

## 2018-04-10 DIAGNOSIS — Q231 Congenital insufficiency of aortic valve: Principal | ICD-10-CM

## 2018-04-10 DIAGNOSIS — Q2381 Bicuspid aortic valve: Secondary | ICD-10-CM

## 2018-04-10 DIAGNOSIS — I350 Nonrheumatic aortic (valve) stenosis: Secondary | ICD-10-CM | POA: Diagnosis not present

## 2018-04-10 DIAGNOSIS — R5381 Other malaise: Secondary | ICD-10-CM | POA: Diagnosis not present

## 2018-04-10 DIAGNOSIS — I5042 Chronic combined systolic (congestive) and diastolic (congestive) heart failure: Secondary | ICD-10-CM | POA: Diagnosis present

## 2018-04-10 DIAGNOSIS — K30 Functional dyspepsia: Secondary | ICD-10-CM | POA: Diagnosis not present

## 2018-04-10 DIAGNOSIS — J9601 Acute respiratory failure with hypoxia: Secondary | ICD-10-CM | POA: Diagnosis not present

## 2018-04-10 HISTORY — DX: Epigastric pain: R10.13

## 2018-04-10 MED ORDER — METOCLOPRAMIDE HCL 5 MG PO TABS: 5.0000 mg | ORAL_TABLET | Freq: Three times a day (TID) | ORAL | Status: DC

## 2018-04-10 MED ORDER — ONDANSETRON HCL 4 MG/2ML IJ SOLN: 4.0000 mg | Freq: Four times a day (QID) | INTRAMUSCULAR | Status: DC | PRN

## 2018-04-10 MED ORDER — ACETAMINOPHEN 325 MG PO TABS: 325.0000 mg | ORAL_TABLET | ORAL | Status: DC | PRN

## 2018-04-10 MED ORDER — ENOXAPARIN SODIUM 100 MG/ML ~~LOC~~ SOLN
1.5000 mg/kg | SUBCUTANEOUS | Status: DC
  Administered 2018-04-11 – 2018-04-12 (×2): 95 mg via SUBCUTANEOUS
  Filled 2018-04-10 (×4): qty 1

## 2018-04-10 MED ORDER — ACETAMINOPHEN 325 MG PO TABS: 650.0000 mg | ORAL_TABLET | ORAL | Status: DC | PRN

## 2018-04-10 MED ORDER — SENNOSIDES-DOCUSATE SODIUM 8.6-50 MG PO TABS: 1.0000 | ORAL_TABLET | Freq: Every evening | ORAL | Status: DC | PRN

## 2018-04-10 MED ORDER — METOCLOPRAMIDE HCL 5 MG PO TABS
5.0000 mg | ORAL_TABLET | Freq: Three times a day (TID) | ORAL | Status: DC
  Administered 2018-04-10 – 2018-04-13 (×11): 5 mg via ORAL
  Filled 2018-04-10 (×11): qty 1

## 2018-04-10 MED ORDER — PANTOPRAZOLE SODIUM 40 MG PO TBEC: 40.0000 mg | DELAYED_RELEASE_TABLET | Freq: Every day | ORAL | 0 refills | Status: DC

## 2018-04-10 MED ORDER — PANTOPRAZOLE SODIUM 40 MG PO TBEC
40.0000 mg | DELAYED_RELEASE_TABLET | Freq: Every day | ORAL | Status: DC
  Administered 2018-04-11 – 2018-04-13 (×3): 40 mg via ORAL
  Filled 2018-04-10 (×3): qty 1

## 2018-04-10 MED ORDER — NITROGLYCERIN 0.4 MG SL SUBL: 0.4000 mg | SUBLINGUAL_TABLET | SUBLINGUAL | Status: DC | PRN

## 2018-04-10 NOTE — Plan of Care (Signed)
  Problem: Consults Goal: RH GENERAL PATIENT EDUCATION Description See Patient Education module for education specifics. Outcome: Progressing   Problem: RH PAIN MANAGEMENT Goal: RH STG PAIN MANAGED AT OR BELOW PT'S PAIN GOAL Description Less than 3 out of 10  Outcome: Progressing   Problem: RH KNOWLEDGE DEFICIT GENERAL Goal: RH STG INCREASE KNOWLEDGE OF SELF CARE AFTER HOSPITALIZATION Description Pt will be able to demonstrate proper use and care for life vest with min cues and supervision.   Outcome: Progressing

## 2018-04-10 NOTE — Progress Notes (Signed)
Social Work Discharge Note  The overall goal for the admission was met for:   Discharge location: Yes - pt tx'd back to acute hospital to await surgery  Length of Stay: Yes - 5 days  Discharge activity level: Yes - independent  Home/community participation: No  Services provided included: MD, RD, PT, OT, SLP, RN, Pharmacy and [employer]  Financial Services: Private Insurance: [employer] [place]  Follow-up services arranged: Other: Pt would likely benefit from [facility] PT/OT.  Will need to be set up once pt is ready for d/c from acute.  No DME recommended at this time.  Comments (or additional information):  Patient/Family verbalized understanding of follow-up arrangements: Yes  Individual responsible for coordination of the follow-up plan: pt, his wife, and son Dayne Chait - wife - [phone] Rihan Schueler, PT - son - [phone]   Confirmed correct DME delivered: Trey Sailors 04/10/2018    Brody Kump, Silvestre Mesi

## 2018-04-10 NOTE — Progress Notes (Signed)
Reading PHYSICAL MEDICINE & REHABILITATION PROGRESS NOTE   Subjective/Complaints: Up in chair. Right arm still a little sore. Otherwise doing well.   ROS: Patient denies fever, rash, sore throat, blurred vision, nausea, vomiting, diarrhea, cough, shortness of breath or chest pain,   headache, or mood change.    Objective:   No results found. No results for input(s): WBC, HGB, HCT, PLT in the last 72 hours. No results for input(s): NA, K, CL, CO2, GLUCOSE, BUN, CREATININE, CALCIUM in the last 72 hours.  Intake/Output Summary (Last 24 hours) at 04/10/2018 0914 Last data filed at 04/09/2018 1827 Gross per 24 hour  Intake 560 ml  Output -  Net 560 ml     Physical Exam: Vital Signs Blood pressure (!) 145/85, pulse 75, temperature 98 F (36.7 C), temperature source Oral, resp. rate 18, height 5\' 10"  (1.778 m), weight 64.9 kg, SpO2 100 %. Constitutional: No distress . Vital signs reviewed. HEENT: EOMI, oral membranes moist Neck: supple Cardiovascular: RRR without murmur. No JVD    Respiratory: CTA Bilaterally without wheezes or rales. Normal effort    GI: BS +, non-tender, non-distended  Extremities: No clubbing, cyanosis, or edema Skin: mild tenderness in right antecub fossa Neurologic:alert and oriented x3. Motor 3-4/5 all 4's.cognitively appropriate.  Musculoskeletal:  Full ROM Psych: pleasant   Assessment/Plan: 1. Functional deficits secondary to debility which require 3+ hours per day of interdisciplinary therapy in a comprehensive inpatient rehab setting.  Physiatrist is providing close team supervision and 24 hour management of active medical problems listed below.  Physiatrist and rehab team continue to assess barriers to discharge/monitor patient progress toward functional and medical goals  Care Tool:  Bathing  Bathing activity did not occur: Safety/medical concerns Body parts bathed by patient: Right arm, Left arm, Abdomen, Chest, Right upper leg, Left upper leg,  Right lower leg, Front perineal area, Left lower leg, Buttocks, Face         Bathing assist Assist Level: Independent with assistive device     Upper Body Dressing/Undressing Upper body dressing   What is the patient wearing?: Pull over shirt    Upper body assist Assist Level: Independent with assistive device    Lower Body Dressing/Undressing Lower body dressing      What is the patient wearing?: Underwear/pull up, Pants     Lower body assist Assist for lower body dressing: Independent with assitive device     Toileting Toileting    Toileting assist Assist for toileting: Supervision/Verbal cueing     Transfers Chair/bed transfer  Transfers assist     Chair/bed transfer assist level: Independent     Locomotion Ambulation   Ambulation assist      Assist level: Independent Assistive device: (none) Max distance: 1000'+   Walk 10 feet activity   Assist     Assist level: Independent Assistive device: Other (comment)(none)   Walk 50 feet activity   Assist    Assist level: Independent Assistive device: Other (comment)(none)    Walk 150 feet activity   Assist    Assist level: Independent Assistive device: Other (comment)(none)    Walk 10 feet on uneven surface  activity   Assist     Assist level: Independent Assistive device: Other (comment)(none)   Wheelchair     Assist Will patient use wheelchair at discharge?: No   Wheelchair activity did not occur: N/A         Wheelchair 50 feet with 2 turns activity    Assist  Wheelchair 50 feet with 2 turns activity did not occur: N/A       Wheelchair 150 feet activity     Assist Wheelchair 150 feet activity did not occur: N/A        Medical Problem List and Plan: 1. Debility secondary to v fib cardiac arrest   --Continue CIR therapies including PT, OT    -plan is for return to acute today in expectation of AVR/pacer  -should not remove life vest for  showering.  2. Antithrombotics: -DVT,PE/anticoagulation: Pharmaceutical: Lovenox mg/kg -antiplatelet therapy: none 3. Pain Management: Acetaminophen 650mg  Q 6 h prn 4. Mood:  -antipsychotic agents: monitor for depression/adjustment issues 5. Neuropsych: This patient is capable of making decisions on his own behalf. 6. Skin/Wound Care: mild phlebitis right antecubital space.---warm compress 7. Fluids/Electrolytes/Nutrition: monitor Is and Os, HDPD diet   -encourage PO   8. GI:  symptoms most c/w esophageal or GI dysmotility  -has repsonded well to reglan  -small bites, chewing thoroughly, choosing right types of food  - continueprotonix  - continue reglan 5mg  tid b/f meals  - has been moving bowels   LOS: 5 days A FACE TO FACE EVALUATION WAS PERFORMED  Ranelle Oyster 04/10/2018, 9:14 AM

## 2018-04-10 NOTE — H&P (Signed)
Cardiology Admission History and Physical:   Patient ID: Kevin Rojas MRN: [id number]; DOB: Jun 22, 1955   Admission date: 04/05/2018  Primary Care Provider: [name], MD Primary Cardiologist: Tonny Bollman, MD  Chief Complaint:  Aortic stenosis   Patient Profile:   Kevin Rojas is a 63 y.o. male admitted from inpatient rehab.   History of Present Illness:   Kevin Rojas Admitted 03/24/2018 for Vfib cardiac arrest at home.CPR started by wife. He was treated with CPR x3 minutes by family as well as defibrillation by EMS. He was intubated and treated with cooling protocol. He has subsequently been extubated and treated for likely aspiration pneumonia. Noted LBBB on EKG. Work-up has included identification of severe aortic regurgitation with LV enlargement. Postarrest the patient has slowly progressed and cognitive impairment and functional ability is significantly improved. Amiodarone was discontinued on 318/2020 due to severe bradycardia while on hypothermia protocol.incidentally found to have PE on CT angio of chest on 04/04/18 and started on Lovenox. Cardiac cath demonstratedwidely patent coronaries with left dominant anatomy. Normal pulmonary pressures and documentation of severe aortic regurgitation. Seen by Dr Tyrone Sage with plans for AVR withprobable aortic root and/or ascending ao replacement, ideally once patient has undergone some inpatient rehab. Seen by Dr. Ladona Ridgel and recommended ICD following surgery.   Patient had 2 weeks of inpatient rehab 3/29-04/10/18. He has had improvement in activity tolerance, balance, postural control as well as ability to compensate for deficits. Patient has symptoms concerning for esophageal or GI dysmotility which improved on reglan. Has been moving bowels.   Patient was seen by CTCS today at inpatient rehab and plan for AVR next week. Now being transferred to floor on 6E.         Past Surgical History:  Procedure Laterality Date     AORTIC ARCH ANGIOGRAPHY N/A 04/02/2018   Procedure: AORTIC ARCH ANGIOGRAPHY;  Surgeon: Lennette Bihari, MD;  Location: [facility] INVASIVE CV [facility];  Service: Cardiovascular;  Laterality: N/A;   RIGHT/LEFT HEART CATH AND CORONARY ANGIOGRAPHY N/A 04/02/2018   Procedure: RIGHT/LEFT HEART CATH AND CORONARY ANGIOGRAPHY;  Surgeon: Lennette Bihari, MD;  Location: [facility] INVASIVE CV LAB;  Service: Cardiovascular;  Laterality: N/A;     Medications Prior to Admission:        Prior to Admission medications   Medication Sig Start Date End Date Taking? Authorizing Provider  acetaminophen (TYLENOL) 325 MG tablet Take 1-2 tablets (325-650 mg total) by mouth every 4 (four) hours as needed for mild pain. 04/10/18   Love, Evlyn Kanner, PA-C  enoxaparin (LOVENOX) 100 MG/ML injection Inject 0.95 mLs (95 mg total) into the skin daily. 04/04/18   Noralee Stain, DO  metoCLOPramide (REGLAN) 5 MG tablet Take 1 tablet (5 mg total) by mouth 4 (four) times daily -  before meals and at bedtime. 04/10/18   Love, Evlyn Kanner, PA-C  pantoprazole (PROTONIX) 40 MG tablet Take 1 tablet (40 mg total) by mouth daily. 04/10/18   Love, Evlyn Kanner, PA-C  senna-docusate (SENOKOT-S) 8.6-50 MG tablet Take 1 tablet by mouth at bedtime as needed for mild constipation. 04/10/18   Love, Evlyn Kanner, PA-C     Allergies:   No Known Allergies  Social History:   Social History   Socioeconomic History   Marital status: Married    Spouse name: Not on file   Number of children: Not on file   Years of education: Not on file   Highest education level: Not on file  Occupational History   Not on file  Social Network engineer strain: Not on file   Food insecurity:    Worry: Not on file    Inability: Not on file   Transportation needs:    Medical: Not on file    Non-medical: Not on file  Tobacco Use   Smoking status: Never Smoker   Smokeless tobacco: Never Used  Substance and Sexual Activity   Alcohol use: Never     Frequency: Never   Drug use: Never   Sexual activity: Not on file  Lifestyle   Physical activity:    Days per week: Not on file    Minutes per session: Not on file   Stress: Not on file  Relationships   Social connections:    Talks on phone: Not on file    Gets together: Not on file    Attends religious service: Not on file    Active member of club or organization: Not on file    Attends meetings of clubs or organizations: Not on file    Relationship status: Not on file   Intimate partner violence:    Fear of current or ex partner: Not on file    Emotionally abused: Not on file    Physically abused: Not on file    Forced sexual activity: Not on file  Other Topics Concern   Not on file  Social History Narrative   Not on file    Family History:   The patient's family history includes Prostate cancer in his father; Pulmonary embolism in his mother.    ROS:  Please see the history of present illness.  All other ROS reviewed and negative.     Physical Exam/Data:         Vitals:   04/09/18 1521 04/09/18 1955 04/10/18 0300 04/10/18 0621  BP: 117/70 133/75 140/79 (!) 145/85  Pulse: 75  85 75  Resp: 20 17 19 18   Temp: 98.4 F (36.9 C) 98.4 F (36.9 C) 98.6 F (37 C) 98 F (36.7 C)  TempSrc: Oral Oral Oral Oral  SpO2: 99% 98% 99% 100%  Weight:      Height:        Intake/Output Summary (Last 24 hours) at 04/10/2018 1354 Last data filed at 04/10/2018 0830    Gross per 24 hour  Intake 440 ml  Output --  Net 440 ml   Last 3 Weights 04/05/2018 04/05/2018 04/04/2018  Weight (lbs) 143 lb 1.3 oz 138 lb 3.7 oz 142 lb 11.2 oz  Weight (kg) 64.9 kg 62.7 kg 64.728 kg     Body mass index is 20.53 kg/m.   EXAM PER MD: General:  Well nourished, well developed, in no acute distress HEENT: normal Lymph: no adenopathy Neck: no JVD Endocrine:  No thryomegaly Vascular: No carotid bruits; FA pulses 2+ bilaterally without bruits   Cardiac:  normal S1, S2; RRR; 2/6 decrescendo RLSB diastolic murmur  Lungs:  clear to auscultation bilaterally, no wheezing, rhonchi or rales  Abd: soft, nontender, no hepatomegaly  Ext: no edema Musculoskeletal:  No deformities, BUE and BLE strength normal and equal Skin: warm and dry  Neuro:  CNs 2-12 intact, no focal abnormalities noted Psych:  Normal affect    EKG:  The ECG that was done 04/01/18 was personally reviewed and demonstrates : SR with LBBB  Relevant CV Studies:  Echo 03/25/18 IMPRESSIONS   1. The left ventricle has mildly reduced systolic function, with an ejection fraction of 45-50%. The cavity size was mildly dilated. Left  ventricular diastolic Doppler parameters are consistent with pseudonormalization. 2. The right ventricle has normal systolic function. The cavity was normal. There is no increase in right ventricular wall thickness. 3. Right atrial size was mildly dilated. 4. Trivial pericardial effusion is present. 5. Mild thickening of the mitral valve leaflet. 6. The aortic valveis bicuspid Aortic valve regurgitation is severe by color flow Doppler. 7. The aortic valve appears to be a bicuspid or functionally bicuspid AV. There is severe AI. The aortic root is mildly dilated. 8. There is mild dilatation of the ascending aorta. 9. The aortic valve appears to be a bicuspid or functionally bicuspid AV. There is severe AI. The aortic root is mildly dilated . There is concern for a Type A aortic dissection. Suggest STAT CT angiogram of the chest for further evaluation.   10. The inferior vena cava was normal in size with <50% respiratory variability. 11. The interatrial septum was not assessed.   AORTIC ARCH ANGIOGRAPHY   04/02/2018  RIGHT/LEFT HEART CATH AND CORONARY ANGIOGRAPHY  Conclusion     There is mild left ventricular systolic dysfunction.  LV end diastolic pressure is normal.  The left ventricular  ejection fraction is 45-50% by visual estimate.  There is severe (4+) aortic regurgitation.  Normal right heart pressures.  Mild global LV dysfunction with an EF 45 to 50%.  Bicuspid aortic valve with severe 4+ aortic insufficiency, mild aortic stenosis.  Dilated aortic root.  Normal coronary arteries and a left dominant circulation.  RECOMMENDATION: The patient will ultimately need surgical consultation for aortic valve repair/replacement.    Laboratory Data:  Chemistry LastLabs     Recent Labs  Lab 04/04/18 0308  NA 137  K 3.6  CL 108  CO2 23  GLUCOSE 141*  BUN 12  CREATININE 1.25*  CALCIUM 8.6*  GFRNONAA >60  GFRAA >60  ANIONGAP 6       Radiology/Studies:  No results found.  Assessment and Plan:   1. Bicuspid aortic valve with severe AI - Plan AVR next week with possible aortic root and/or ascending ao replacement  2. Vfib cardiac arrest - Has mild LV dysfunction and and hypokalemia, 3.2 on presentation.normal coronaries by cath.  - Seen by EP and recommended ICD following surgery  3. Chronic combined CHF - He was not placed on BB due to bradycardia.  - Review HF therapy during admission on floor  4. Subsegmental PE of RLL and RML - Continue Lovenox  5. ?esophageal or GI dysmotility - Continue Reglan and PPI  Will check BMET and CBC in morning.   Severity of Illness: The appropriate patient status for this patient is INPATIENT. Inpatient status is judged to be reasonable and necessary in order to provide the required intensity of service to ensure the patient's safety. The patient's presenting symptoms, physical exam findings, and initial radiographic and laboratory data in the context of their chronic comorbidities is felt to place them at high risk for further clinical deterioration. Furthermore, it is not anticipated that the patient will be medically stable for discharge from the hospital within 2 midnights of admission. The  following factors support the patient status of inpatient.   "           The patient's presenting symptoms include deconditioning. "           The worrisome physical exam findings include None "           The initial radiographic and laboratory data are worrisome because of AVR "  The chronic co-morbidities include PE, cardiac arrest and deconditioning   * I certify that at the point of admission it is my clinical judgment that the patient will require inpatient hospital care spanning beyond 2 midnights from the point of admission due to high intensity of service, high risk for further deterioration and high frequency of surveillance required.*    For questions or updates, please contact [facility] Please consult [email or web address] for contact info under        SignedManson Passey, PA  04/10/2018 1:54 PM          I have seen and examined the patient along with Kevin Passey, PA .  I have reviewed the chart, notes and new data.  I agree with PA/NP's note.  Key new complaints: feels well, denies pain, dyspnea or dizziness Key examination changes: my exam above Key new findings / data: echo and cardiac cath data reviewed.  PLAN: Prepare for AVR on Monday. Resume anticoagulation with oral agents once appropriate post-op. Will involve EP (Dr. Ladona Ridgel) postop to proceed with planned ICD during inpatient stay, rather than return for another outpatient procedure due to current COVID19 crisis.Kevin Fair, MD, [facility] [phone] 04/10/2018, 4:12 PM

## 2018-04-10 NOTE — Progress Notes (Signed)
Pt discharge to 6E24. Pt stable at time of Discharge. No complaint of pain or discomfort voiced. Belongings sent with pt.   Kevin Rojas [name]

## 2018-04-10 NOTE — Progress Notes (Deleted)
Placed 14Fr [place] sump left nares for gastric procedure contrast as unable to drink solution for prep. Tolerated procedure well, placement verified with air ascultation and secured with NG clip on left nare.  Pamelia Hoit

## 2018-04-10 NOTE — Progress Notes (Signed)
Pt resting comfortably. Mod I in the room. Pt denies any needs at present. Pt has life vest on and intact.

## 2018-04-10 NOTE — Patient Care Conference (Addendum)
Inpatient RehabilitationTeam Conference and Plan of Care Update Date: 04/07/2018   Time: 2:30 PM    Patient Name: Kevin Rojas      Medical Record Number: [id number]  Date of Birth: 07-06-1955 Sex: Male         Room/Bed: 4W08C/4W08C-01 Payor Info: Payor: BLUE [place] / Plan: BCBS OTHER / Product Type: *No Product type* /    Admitting Diagnosis: Cardiac arrect debility  Admit Date/Time:  04/05/2018  3:28 PM Admission Comments: No comment available   Primary Diagnosis:  Debility Principal Problem: Debility  Patient Active Problem List   Diagnosis Date Noted  . Cognitive disorder   . Constipation 04/07/2018  . Debility 04/05/2018  . Acute pulmonary embolism ([facility]) 04/05/2018  . Acute respiratory failure with hypoxia ([facility])   . Aspiration into airway   . Severe aortic insufficiency 03/26/2018  . Cardiac arrest ([facility]) 03/24/2018  . LBBB (left bundle branch block) 03/24/2018  . Elevated troponin 03/24/2018  . Hypokalemia 03/24/2018  . Endotracheally intubated 03/24/2018  . Abnormal CXR 03/24/2018  . Aspiration pneumonia ([facility]) 03/24/2018    Expected Discharge Date: Expected Discharge Date: 04/10/18 to acute  Team Members Present: Physician leading conference: Dr. Alger Simons Social Worker Present: Alfonse Alpers, LCSW Nurse Present: Genene Churn, RN PT Present: Lavone Nian, PT OT Present: Other (comment)(Sandra Rosana Hoes, OT) SLP Present: Weston Anna, SLP PPS Coordinator present : Gunnar Fusi     Current Status/Progress Goal Weekly Team Focus  Medical   AVS, deconditioned, swallowing issues ?esophageal, no ileus, for AVR soon.   improve activity tolerance  swallowing assessment, activity tolerance, strengthening   Bowel/Bladder   Continent of bowel and bladder, LBM 04/08/2018  Continent of bowel and bladder. Normal patter.   Assess bowel and bladder status every shift   Swallow/Nutrition/ Hydration             ADL's   CGA ADLs and mobility   mod I ADLs  and transfers, (S) for awareness goals  dynamic standing balance, functional activity tolerance/cardiorespiratory endurance   Mobility   supervision overall without AD  mod I  high level balance, stair negotiation, gait, strengthening, d/c planning   Communication             Safety/Cognition/ Behavioral Observations  Mod I  Mod I  Goals Met, D/C from SLP   Pain   No complaint of pain or discomfort  remain free of pain or discomfort  Assess pain every shift   Skin   Skin is intact  Skin remain intact  Assess skin every shift    Rehab Goals Patient on target to meet rehab goals: Yes Rehab Goals Revised: none *See Care Plan and progress notes for long and short-term goals.     Barriers to Discharge  Current Status/Progress Possible Resolutions Date Resolved   Physician    Medical stability               Nursing                  PT                    OT                  SLP                SW                Discharge Planning/Teaching Needs:  Pt plans to tx  to acute to await heart surgery.  Pt is independent and can direct any care he needs.   Team Discussion:  Pt with severe AVS and needs AVR.  CIR to help pt recondition to prepare for surgery.  Pt has a little anxiety with everything he has gone through and with surgery pending.  Pt has abdominal discomfort and Dr. Naaman Plummer is monitoring.  Pt is doing great with OT and is supervision, close to mod I with independent goals.  Pt is the same level with PT, not using an assistive device, 49/56 on BERG.  Pt evaluated by ST and has some higher level cognitive deficits, but functionally better, so ST is signing off.  Revisions to Treatment Plan:  none    Continued Need for Acute Rehabilitation Level of Care: The patient requires daily medical management by a physician with specialized training in physical medicine and rehabilitation for the following conditions: Daily direction of a multidisciplinary physical rehabilitation  program to ensure safe treatment while eliciting the highest outcome that is of practical value to the patient.: Yes Daily medical management of patient stability for increased activity during participation in an intensive rehabilitation regime.: Yes Daily analysis of laboratory values and/or radiology reports with any subsequent need for medication adjustment of medical intervention for : Neurological problems;Cardiac problems   I attest that I was present, lead the team conference, and concur with the assessment and plan of the team. Team conference was held via web/ teleconference due to [place], Silvestre Mesi 04/10/2018, 10:48 AM

## 2018-04-10 NOTE — Discharge Instructions (Signed)
COMMUNITY REFERRALS UPON DISCHARGE:   Home Health:  Pt would probably benefit from PT/OT once discharged from Acute Hospital following surgery.  No medical equipment needed.

## 2018-04-10 NOTE — Progress Notes (Signed)
Social Work Patient ID: Kevin Rojas, male   DOB: 01/08/1955, 62 y.o.   MRN: [id number]   CSW met with pt to update him on team conference discussion, which initially was that pt will have met goals to go to acute to await heart surgery.  Then, pt was told that he would go home to await surgery.  After a day of further discussion, cardiology decided to have pt return to acute and then have AVR.  Pt is fine with this and looks forward to "getting on with it" so he can go home and take his wife fishing.  Team would recommend HH PT/OT if pt was leaving from our unit, no DME needed.  Pt appreciative of CIR care and CSW remains available as needed, although pt is expected to tx to acute today.    

## 2018-04-10 NOTE — Progress Notes (Signed)
      [address]       [place] [address]             [phone]            Subjective: Patient sitting in chair and doing "computer work".  He states his right upper arm is still sore  Objective: Vital signs in last 24 hours: Temp:  [98 F (36.7 C)-98.6 F (37 C)] 98 F (36.7 C) (04/03 0621) Pulse Rate:  [75-85] 75 (04/03 0621) Resp:  [17-20] 18 (04/03 0621) BP: (117-145)/(70-85) 145/85 (04/03 0621) SpO2:  [98 %-100 %] 100 % (04/03 6301)   Current Weight  04/05/18 64.9 kg   Intake/Output from previous day: 04/02 0701 - 04/03 0700 In: 680 [P.O.:680] Out: -    Physical Exam:  Cardiovascular: RRR, murmur Pulmonary: Clear to auscultation bilaterally Abdomen: Soft, non tender, bowel sounds present. Extremities: No lower extremity edema. RUE warm and has some bruising   Lab Results: CBC:No results for input(s): WBC, HGB, HCT, PLT in the last 72 hours. BMET: No results for input(s): NA, K, CL, CO2, GLUCOSE, BUN, CREATININE, CALCIUM in the last 72 hours.  PT/INR:  Lab Results  Component Value Date   INR 1.3 (H) 03/25/2018   INR 1.3 (H) 03/25/2018   INR 1.2 03/24/2018   ABG:  INR: Will add last result for INR, ABG once components are confirmed Will add last 4 CBG results once components are confirmed  Assessment/Plan:  1. CV - S/p out of hospital cardiac arrest 03/17. Continues to maintain SR. Will need AVR, hopefully next week. 2.  Pulmonary - On room air. 3. Last creatinine 1.25 on 03/28 (1.5 upon admission) 4. Phlebitis of the right upper arm-remains afebrile,last WBC 8,800 5. Subsegmental PE of RLL and RML-on Lovenox  Elexis Pollak M ZimmermanPA-C 04/10/2018,7:50 AM [phone]

## 2018-04-11 LAB — CBC
HCT: 37.6 % — ABNORMAL LOW (ref 39.0–52.0)
Hemoglobin: 12.1 g/dL — ABNORMAL LOW (ref 13.0–17.0)
MCH: 27.7 pg (ref 26.0–34.0)
MCHC: 32.2 g/dL (ref 30.0–36.0)
MCV: 86 fL (ref 80.0–100.0)
Platelets: 429 10*3/uL — ABNORMAL HIGH (ref 150–400)
RBC: 4.37 MIL/uL (ref 4.22–5.81)
RDW: 14.5 % (ref 11.5–15.5)
WBC: 6 10*3/uL (ref 4.0–10.5)
nRBC: 0 % (ref 0.0–0.2)

## 2018-04-11 LAB — BASIC METABOLIC PANEL
Anion gap: 7 (ref 5–15)
BUN: 11 mg/dL (ref 8–23)
CO2: 26 mmol/L (ref 22–32)
Calcium: 9 mg/dL (ref 8.9–10.3)
Chloride: 104 mmol/L (ref 98–111)
Creatinine, Ser: 1.23 mg/dL (ref 0.61–1.24)
GFR calc Af Amer: >60 mL/min (ref >60)
GFR calc non Af Amer: >60 mL/min (ref >60)
Glucose, Bld: 97 mg/dL (ref 70–99)
Potassium: 4.2 mmol/L (ref 3.5–5.1)
Sodium: 137 mmol/L (ref 135–145)

## 2018-04-11 NOTE — Progress Notes (Signed)
   Progress Note  Patient Name: Kevin Rojas Date of Encounter: 04/11/2018  Primary Cardiologist: Tonny Bollman, MD   Subjective   No CP or dyspnea  Inpatient Medications    Scheduled Meds: . enoxaparin  1.5 mg/kg Subcutaneous Q24H  . metoCLOPramide  5 mg Oral TID AC & HS  . pantoprazole  40 mg Oral Daily   Continuous Infusions:  PRN Meds: acetaminophen, nitroGLYCERIN, ondansetron (ZOFRAN) IV, senna-docusate   Vital Signs    Vitals:   04/10/18 1551 04/11/18 0646  BP: 114/78 125/78  Pulse: 74 81  Resp: 18 16  Temp: 98.1 F (36.7 C) 98.3 F (36.8 C)  TempSrc: Oral Oral  SpO2: 100% 99%  Weight: 64.1 kg 63 kg  Height: 5\' 9"  (1.753 m)     Intake/Output Summary (Last 24 hours) at 04/11/2018 0733 Last data filed at 04/11/2018 0657 Gross per 24 hour  Intake 702 ml  Output 2 ml  Net 700 ml   Last 3 Weights 04/11/2018 04/10/2018 04/05/2018  Weight (lbs) 138 lb 14.4 oz 141 lb 6.4 oz 143 lb 1.3 oz  Weight (kg) 63.005 kg 64.139 kg 64.9 kg      Telemetry    Sinus- Personally Reviewed   Physical Exam   GEN: No acute distress.   Neck: No JVD Cardiac: RRR, 2/6 systolic murmur; 1/6 DM Respiratory: Clear to auscultation bilaterally. GI: Soft, nontender, non-distended  MS: No edema Neuro:  Nonfocal  Psych: Normal affect   Labs    Chemistry Recent Labs  Lab 04/11/18 0302  NA 137  K 4.2  CL 104  CO2 26  GLUCOSE 97  BUN 11  CREATININE 1.23  CALCIUM 9.0  GFRNONAA >60  GFRAA >60  ANIONGAP 7     Hematology Recent Labs  Lab 04/11/18 0302  WBC 6.0  RBC 4.37  HGB 12.1*  HCT 37.6*  MCV 86.0  MCH 27.7  MCHC 32.2  RDW 14.5  PLT 429*    Patient Profile     63 y.o. male status post ventricular fibrillation cardiac arrest on March 17.  Found to have severe aortic insufficiency with left ventricular enlargement.  Also found to have pulmonary embolus on CT angiogram.  Cardiac catheterization showed no coronary disease.  Plan is aortic valve replacement  followed by ICD.  Echocardiogram March 2020 showed ejection fraction 45 to 50%, mild left ventricular enlargement, severe aortic insufficiency with bicuspid aortic valve, mildly dilated aortic root.  CTA March 2020 showed pulmonary embolus, dilated aortic root at 4.2 cm, dilated a sending aorta at 4.3 cm.  Assessment & Plan    1 bicuspid aortic valve with severe aortic insufficiency-plan is for aortic valve replacement and possible aortic root replacement next week.  2 recent pulmonary embolus-continue enoxaparin.  Will need 3-6 months of anticoagulation following aortic valve replacement.  3 status post cardiac arrest-patient will need ICD following aortic valve replacement.  4 GI dysmotility-continue Reglan and Protonix.  For questions or updates, please contact [facility] Please consult [email or web address] for contact info under        Signed, Olga Millers, MD  04/11/2018, 7:33 AM

## 2018-04-12 LAB — URINALYSIS, ROUTINE W REFLEX MICROSCOPIC
Bilirubin Urine: NEGATIVE
Glucose, UA: NEGATIVE mg/dL
Hgb urine dipstick: NEGATIVE
Ketones, ur: NEGATIVE mg/dL
Leukocytes,Ua: NEGATIVE
Nitrite: NEGATIVE
Protein, ur: NEGATIVE mg/dL
Specific Gravity, Urine: 1.016 (ref 1.005–1.030)
pH: 5 (ref 5.0–8.0)

## 2018-04-12 LAB — SURGICAL PCR SCREEN
MRSA, PCR: NEGATIVE
Staphylococcus aureus: NEGATIVE

## 2018-04-12 MED ORDER — DOCUSATE SODIUM 100 MG PO CAPS
200.0000 mg | ORAL_CAPSULE | Freq: Every day | ORAL | Status: DC
  Administered 2018-04-12 – 2018-04-13 (×2): 200 mg via ORAL
  Filled 2018-04-12 (×2): qty 2

## 2018-04-12 NOTE — Progress Notes (Signed)
   Progress Note  Patient Name: Kevin Rojas Date of Encounter: 04/12/2018  Primary Cardiologist: Tonny Bollman, MD   Subjective   Pt denies CP or dyspnea  Inpatient Medications    Scheduled Meds: . enoxaparin  1.5 mg/kg Subcutaneous Q24H  . metoCLOPramide  5 mg Oral TID AC & HS  . pantoprazole  40 mg Oral Daily   Continuous Infusions:  PRN Meds: acetaminophen, nitroGLYCERIN, ondansetron (ZOFRAN) IV, senna-docusate   Vital Signs    Vitals:   04/11/18 1500 04/11/18 1744 04/11/18 2019 04/12/18 0527  BP: (!) 150/95 (!) 142/94 (!) 141/74 113/70  Pulse:   65 75  Resp:      Temp:   98.1 F (36.7 C) 98.6 F (37 C)  TempSrc:   Oral Oral  SpO2:   100% 98%  Weight:    63.1 kg  Height:        Intake/Output Summary (Last 24 hours) at 04/12/2018 0708 Last data filed at 04/11/2018 1805 Gross per 24 hour  Intake 900 ml  Output -  Net 900 ml   Last 3 Weights 04/12/2018 04/11/2018 04/10/2018  Weight (lbs) 139 lb 3.2 oz 138 lb 14.4 oz 141 lb 6.4 oz  Weight (kg) 63.141 kg 63.005 kg 64.139 kg      Telemetry    Sinus- Personally Reviewed   Physical Exam   GEN: No acute distress.  WD WN Neck: No JVD, supple Cardiac: RRR, 2/6 systolic murmur Respiratory: Clear to auscultation bilaterally; no wheeze GI: Soft, NT/ND MS: No edema Neuro:  Grossly intact   Labs    Chemistry Recent Labs  Lab 04/11/18 0302  NA 137  K 4.2  CL 104  CO2 26  GLUCOSE 97  BUN 11  CREATININE 1.23  CALCIUM 9.0  GFRNONAA >60  GFRAA >60  ANIONGAP 7     Hematology Recent Labs  Lab 04/11/18 0302  WBC 6.0  RBC 4.37  HGB 12.1*  HCT 37.6*  MCV 86.0  MCH 27.7  MCHC 32.2  RDW 14.5  PLT 429*    Patient Profile     63 y.o. male status post ventricular fibrillation cardiac arrest on March 17.  Found to have severe aortic insufficiency with left ventricular enlargement.  Also found to have pulmonary embolus on CT angiogram.  Cardiac catheterization showed no coronary disease.  Plan is  aortic valve replacement followed by ICD.  Echocardiogram March 2020 showed ejection fraction 45 to 50%, mild left ventricular enlargement, severe aortic insufficiency with bicuspid aortic valve, mildly dilated aortic root.  CTA March 2020 showed pulmonary embolus, dilated aortic root at 4.2 cm, dilated a sending aorta at 4.3 cm.  Assessment & Plan    1 bicuspid aortic valve with severe aortic insufficiency-proceed with aortic valve replacement and possible aortic root replacement, schedule per cardiothoracic surgery; possibly Monday or Tuesday.  2 recent pulmonary embolus-continue enoxaparin.  Would plan 3 to 6 months of anticoagulation following surgery.  3 status post cardiac arrest-patient will need ICD following aortic valve replacement.  4 GI dysmotility-continue Reglan and Protonix.  For questions or updates, please contact [facility] Please consult [email or web address] for contact info under        Signed, Olga Millers, MD  04/12/2018, 7:08 AM

## 2018-04-13 ENCOUNTER — Inpatient Hospital Stay (HOSPITAL_COMMUNITY): Payer: BLUE CROSS/BLUE SHIELD

## 2018-04-13 ENCOUNTER — Telehealth: Payer: Self-pay | Admitting: Cardiology

## 2018-04-13 ENCOUNTER — Telehealth: Payer: Self-pay | Admitting: Cardiovascular Disease

## 2018-04-13 DIAGNOSIS — I351 Nonrheumatic aortic (valve) insufficiency: Secondary | ICD-10-CM

## 2018-04-13 LAB — COMPREHENSIVE METABOLIC PANEL
ALT: 40 U/L (ref 0–44)
AST: 26 U/L (ref 15–41)
Albumin: 3.2 g/dL — ABNORMAL LOW (ref 3.5–5.0)
Alkaline Phosphatase: 76 U/L (ref 38–126)
Anion gap: 6 (ref 5–15)
BUN: 12 mg/dL (ref 8–23)
CO2: 26 mmol/L (ref 22–32)
Calcium: 9.1 mg/dL (ref 8.9–10.3)
Chloride: 104 mmol/L (ref 98–111)
Creatinine, Ser: 1.29 mg/dL — ABNORMAL HIGH (ref 0.61–1.24)
GFR calc Af Amer: >60 mL/min (ref >60)
GFR calc non Af Amer: 59 mL/min — ABNORMAL LOW (ref >60)
Glucose, Bld: 100 mg/dL — ABNORMAL HIGH (ref 70–99)
Potassium: 4.6 mmol/L (ref 3.5–5.1)
Sodium: 136 mmol/L (ref 135–145)
Total Bilirubin: 0.9 mg/dL (ref 0.3–1.2)
Total Protein: 6.8 g/dL (ref 6.5–8.1)

## 2018-04-13 LAB — CBC
HCT: 39.3 % (ref 39.0–52.0)
Hemoglobin: 12.4 g/dL — ABNORMAL LOW (ref 13.0–17.0)
MCH: 27.1 pg (ref 26.0–34.0)
MCHC: 31.6 g/dL (ref 30.0–36.0)
MCV: 86 fL (ref 80.0–100.0)
Platelets: 464 10*3/uL — ABNORMAL HIGH (ref 150–400)
RBC: 4.57 MIL/uL (ref 4.22–5.81)
RDW: 14.6 % (ref 11.5–15.5)
WBC: 5 10*3/uL (ref 4.0–10.5)
nRBC: 0 % (ref 0.0–0.2)

## 2018-04-13 LAB — PROTIME-INR
INR: 1.2 (ref 0.8–1.2)
Prothrombin Time: 15.2 seconds (ref 11.4–15.2)

## 2018-04-13 MED ORDER — SENNOSIDES-DOCUSATE SODIUM 8.6-50 MG PO TABS: 1.0000 | ORAL_TABLET | Freq: Every evening | ORAL | 1 refills | Status: DC | PRN

## 2018-04-13 MED ORDER — PANTOPRAZOLE SODIUM 40 MG PO TBEC: 40.0000 mg | DELAYED_RELEASE_TABLET | Freq: Every day | ORAL | 0 refills | Status: DC

## 2018-04-13 MED ORDER — METOCLOPRAMIDE HCL 5 MG PO TABS: 5.0000 mg | ORAL_TABLET | Freq: Three times a day (TID) | ORAL | 1 refills | Status: DC

## 2018-04-13 MED ORDER — RIVAROXABAN (XARELTO) VTE STARTER PACK (15 & 20 MG): ORAL_TABLET | ORAL | 0 refills | Status: DC

## 2018-04-13 MED ORDER — RIVAROXABAN 20 MG PO TABS: 20.0000 mg | ORAL_TABLET | Freq: Every day | ORAL | 3 refills | Status: DC

## 2018-04-13 MED ORDER — ACETAMINOPHEN 325 MG PO TABS: 650.0000 mg | ORAL_TABLET | ORAL | Status: DC | PRN

## 2018-04-13 MED ORDER — RIVAROXABAN 15 MG PO TABS
15.0000 mg | ORAL_TABLET | Freq: Two times a day (BID) | ORAL | Status: DC
  Administered 2018-04-13: 15 mg via ORAL
  Filled 2018-04-13: qty 1

## 2018-04-13 MED FILL — XARELTO STARTER PACK: 15 & 20 | 30 days supply | Qty: 51 | Fill #0

## 2018-04-13 NOTE — Discharge Instructions (Addendum)
YOUR CARDIOLOGY TEAM HAS ARRANGED FOR AN E-VISIT FOR YOUR APPOINTMENT - PLEASE REVIEW IMPORTANT INFORMATION BELOW SEVERAL DAYS PRIOR TO YOUR APPOINTMENT  Due to the recent COVID-19 pandemic, we are transitioning in-person office visits to tele-medicine visits in an effort to decrease unnecessary exposure to our patients and staff. Medicare and most insurances are covering these visits without a copay needed. We also encourage you to sign up for MyChart if you have not already done so. You will need a smartphone if possible. For patients that do not have this, we can still complete the visit using a regular telephone but do prefer a smartphone to enable video when possible. You may have a close family member that lives with you that can help. If possible, we also ask that you have a blood pressure cuff and scale at home to measure your blood pressure, heart rate and weight prior to your scheduled appointment. Patients with clinical needs that need an in-person evaluation and testing will still be able to come to the office if absolutely necessary. If you have any questions, feel free to call our office.  IF YOU HAVE A SMARTPHONE, PLEASE DOWNLOAD THE WEBEX APP TO YOUR SMARTPHONE  - If Apple, go to [employer] and type in WebEx in the search bar. Download [employer], the blue/green circle. The app is free but as with any other app download, your phone may require you to verify saved payment information or Apple password. You do NOT have to create a WebEx account.  - If Android, go to [employer] and type in [employer] in the search bar. Download [employer], the blue/green circle. The app is free but as with any other app download, your phone may require you to verify saved payment information or Android password. You do NOT have to create a WebEx account.  It is very helpful to have this downloaded before your visit.  2-3 DAYS BEFORE YOUR APPOINTMENT  You will receive a telephone call  from one of our [facility] team members - your caller ID may say "Unknown caller." If this is a video visit, we will confirm that you have been able to download the WebEx app. We will remind you check your blood pressure, heart rate and weight prior to your scheduled appointment. If you have an Apple Watch or Kardia, please upload any pertinent ECG strips the day before or morning of your appointment to MyChart. Our staff will also make sure you have reviewed the consent and agree to move forward with your scheduled tele-health visit.   THE DAY OF YOUR APPOINTMENT  Approximately 15 minutes prior to your scheduled appointment, you will receive a telephone call from one of [facility] team - your caller ID may say "Unknown caller."  Our staff will confirm medications, vital signs for the day and any symptoms you may be experiencing. Please have this information available prior to the time of visit start. It may also be helpful for you to have a pad of paper and pen handy for any instructions given during your visit. They will also walk you through joining the WebEx smartphone meeting if this is a video visit.    CONSENT FOR TELE-HEALTH VISIT - PLEASE REVIEW  I hereby voluntarily request, consent and authorize [facility] and its employed or contracted physicians, physician assistants, nurse practitioners or other licensed health care professionals (the Practitioner), to provide me with telemedicine health care services (the Services") as deemed necessary by the treating Practitioner.  I acknowledge and consent to receive the Services by the Practitioner via telemedicine. I understand that the telemedicine visit will involve communicating with the Practitioner through live audiovisual communication technology and the disclosure of certain medical information by electronic transmission. I acknowledge that I have been given the opportunity to request an in-person assessment or other available alternative prior  to the telemedicine visit and am voluntarily participating in the telemedicine visit.  I understand that I have the right to withhold or withdraw my consent to the use of telemedicine in the course of my care at any time, without affecting my right to future care or treatment, and that the Practitioner or I may terminate the telemedicine visit at any time. I understand that I have the right to inspect all information obtained and/or recorded in the course of the telemedicine visit and may receive copies of available information for a reasonable fee.  I understand that some of the potential risks of receiving the Services via telemedicine include:   Delay or interruption in medical evaluation due to technological equipment failure or disruption;  Information transmitted may not be sufficient (e.g. poor resolution of images) to allow for appropriate medical decision making by the Practitioner; and/or   In rare instances, security protocols could fail, causing a breach of personal health information.  Furthermore, I acknowledge that it is my responsibility to provide information about my medical history, conditions and care that is complete and accurate to the best of my ability. I acknowledge that Practitioner's advice, recommendations, and/or decision may be based on factors not within their control, such as incomplete or inaccurate data provided by me or distortions of diagnostic images or specimens that may result from electronic transmissions. I understand that the practice of medicine is not an exact science and that Practitioner makes no warranties or guarantees regarding treatment outcomes. I acknowledge that I will receive a copy of this consent concurrently upon execution via email to the email address I last provided but may also request a printed copy by calling the office of [facility].    I understand that my insurance will be billed for this visit.   I have read or had this consent read  to me.  I understand the contents of this consent, which adequately explains the benefits and risks of the Services being provided via telemedicine.   I have been provided ample opportunity to ask questions regarding this consent and the Services and have had my questions answered to my satisfaction.  I give my informed consent for the services to be provided through the use of telemedicine in my medical care  By participating in this telemedicine visit I agree to the above. __________________________________________________________________________     Information on my medicine - XARELTO (rivaroxaban)   WHY WAS XARELTO PRESCRIBED FOR YOU? Xarelto was prescribed to treat blood clots that may have been found in the veins of your legs (deep vein thrombosis) or in your lungs (pulmonary embolism) and to reduce the risk of them occurring again.  What do you need to know about Xarelto? The starting dose is one 15 mg tablet taken TWICE daily with food for the FIRST 21 DAYS then on 05/04/18 the dose is changed to one 20 mg tablet taken ONCE A DAY with your evening meal.  DO NOT stop taking Xarelto without talking to the health care provider who prescribed the medication.  Refill your prescription for 20 mg tablets before you run out.  After discharge, you should have  regular check-up appointments with your healthcare provider that is prescribing your Xarelto.  In the future your dose may need to be changed if your kidney function changes by a significant amount.  What do you do if you miss a dose? If you are taking Xarelto TWICE DAILY and you miss a dose, take it as soon as you remember. You may take two 15 mg tablets (total 30 mg) at the same time then resume your regularly scheduled 15 mg twice daily the next day.  If you are taking Xarelto ONCE DAILY and you miss a dose, take it as soon as you remember on the same day then continue your regularly scheduled once daily regimen the next  day. Do not take two doses of Xarelto at the same time.   Important Safety Information Xarelto is a blood thinner medicine that can cause bleeding. You should call your healthcare provider right away if you experience any of the following: ? Bleeding from an injury or your nose that does not stop. ? Unusual colored urine (red or dark brown) or unusual colored stools (red or black). ? Unusual bruising for unknown reasons. ? A serious fall or if you hit your head (even if there is no bleeding).  Some medicines may interact with Xarelto and might increase your risk of bleeding while on Xarelto. To help avoid this, consult your healthcare provider or pharmacist prior to using any new prescription or non-prescription medications, including herbals, vitamins, non-steroidal anti-inflammatory drugs (NSAIDs) and supplements.  This website has more information on Xarelto: [email or web address].xarelto.[email or web address].

## 2018-04-13 NOTE — Telephone Encounter (Signed)
New Message      Pt has TOC appt with Dr Excell Seltzer 04/27/18 @ 8:20am

## 2018-04-13 NOTE — Progress Notes (Signed)
ANTICOAGULATION CONSULT NOTE  Pharmacy Consult for xarelto Indication: DVT  No Known Allergies  Patient Measurements: Height: 5\' 9"  (175.3 cm) Weight: 139 lb 1.6 oz (63.1 kg) IBW/kg (Calculated) : 70.7   Vital Signs: Temp: 98.3 F (36.8 C) (04/06 0412) Temp Source: Oral (04/06 0412) BP: 128/69 (04/06 0412) Pulse Rate: 59 (04/06 0412)  Labs: Recent Labs    04/11/18 0302 04/13/18 0333  HGB 12.1* 12.4*  HCT 37.6* 39.3  PLT 429* 464*  LABPROT  --  15.2  INR  --  1.2  CREATININE 1.23 1.29*    Estimated Creatinine Clearance: 53 mL/min (A) (by C-G formula based on SCr of 1.29 mg/dL (H)).   Medical History: No past medical history on file.   Assessment: 63 yo male with PE (diagnosed 04/04/18) on lovenox  to transition to Xarelto. Last dose of lovenox was 04/12/18 at ~ noon. -Hg= 12.4, plt= 464 -SCr= 1.29   Goal of Therapy:  Monitor platelets by anticoagulation protocol: Yes   Plan:  -Xarelto 15mg  po bid for followed by 20mg  po daily (starting 4/27) -Will provide patient education  Harland German, PharmD Clinical Pharmacist **Pharmacist phone directory can now be found on [email or web address] ([email or web address]).  Listed under [facility].

## 2018-04-13 NOTE — Progress Notes (Signed)
Progress Note  Patient Name: Kevin Rojas Date of Encounter: 04/13/2018  Primary Cardiologist: Tonny Bollman, MD   Subjective    breathig is OK   NO CP      Inpatient Medications    Scheduled Meds: . docusate sodium  200 mg Oral Daily  . enoxaparin  1.5 mg/kg Subcutaneous Q24H  . metoCLOPramide  5 mg Oral TID AC & HS  . pantoprazole  40 mg Oral Daily   Continuous Infusions:  PRN Meds: acetaminophen, nitroGLYCERIN, ondansetron (ZOFRAN) IV, senna-docusate   Vital Signs    Vitals:   04/12/18 0527 04/12/18 2059 04/13/18 0410 04/13/18 0412  BP: 113/70 121/82 128/69 128/69  Pulse: 75 79 (!) 59 (!) 59  Resp:  16 16 16   Temp: 98.6 F (37 C) 98.3 F (36.8 C) 98.3 F (36.8 C) 98.3 F (36.8 C)  TempSrc: Oral Oral Oral Oral  SpO2: 98% 99% 99% 99%  Weight: 63.1 kg   63.1 kg  Height:        Intake/Output Summary (Last 24 hours) at 04/13/2018 0817 Last data filed at 04/12/2018 0830 Gross per 24 hour  Intake 180 ml  Output -  Net 180 ml   Last 3 Weights 04/13/2018 04/12/2018 04/11/2018  Weight (lbs) 139 lb 1.6 oz 139 lb 3.2 oz 138 lb 14.4 oz  Weight (kg) 63.095 kg 63.141 kg 63.005 kg      Telemetry      SR   - Personally Reviewed   Physical Exam   GEN: No acute distress.   Neck: No JVD Cardiac: RRR, 2/6 systolic murmur; 1/6 DM Respiratory: Clear to auscultation bilaterally. GI: Soft, nontender, non-distended  MS: No LE  edema Neuro:  Nonfocal  Psych: Normal affect   Labs    Chemistry Recent Labs  Lab 04/11/18 0302 04/13/18 0333  NA 137 136  K 4.2 4.6  CL 104 104  CO2 26 26  GLUCOSE 97 100*  BUN 11 12  CREATININE 1.23 1.29*  CALCIUM 9.0 9.1  PROT  --  6.8  ALBUMIN  --  3.2*  AST  --  26  ALT  --  40  ALKPHOS  --  76  BILITOT  --  0.9  GFRNONAA >60 59*  GFRAA >60 >60  ANIONGAP 7 6     Hematology Recent Labs  Lab 04/11/18 0302 04/13/18 0333  WBC 6.0 5.0  RBC 4.37 4.57  HGB 12.1* 12.4*  HCT 37.6* 39.3  MCV 86.0 86.0  MCH 27.7 27.1   MCHC 32.2 31.6  RDW 14.5 14.6  PLT 429* 464*    Patient Profile     63 y.o. male status post ventricular fibrillation cardiac arrest on March 17.  Found to have severe aortic insufficiency with left ventricular enlargement.  Also found to have pulmonary embolus on CT angiogram.  Cardiac catheterization showed no coronary disease.  Plan is aortic valve replacement followed by ICD.  Echocardiogram March 2020 showed ejection fraction 45 to 50%, mild left ventricular enlargement, severe aortic insufficiency with bicuspid aortic valve, mildly dilated aortic root.  CTA March 2020 showed pulmonary embolus, dilated aortic root at 4.2 cm, dilated a sending aorta at 4.3 cm.  Assessment & Plan    1 bicuspid aortic valve with severe aortic insufficiency-plan is for aortic valve replacement after corona virus   2 recent pulmonary embolus-continue enoxaparin.  Will switch to Xarelto   Pharmacy to help with transition  Will need 3-6 months of anticoagulation following aortic valve replacement.  3 status post cardiac arrest-patient will need ICD following aortic valve replacement.  Keep on life vest at home     4 GI dysmotility-continue Reglan and Protonix.  D/C today with close outpt f/u       For questions or updates, please contact [facility] Please consult [email or web address] for contact info under        Signed, Dietrich Pates, MD  04/13/2018, 8:17 AM

## 2018-04-13 NOTE — H&P (Signed)
      [address]       [place] [address]             [phone]                     LOS: 3 days   Subjective: Patient feels  Well, no heart  failure symptoms currently  Objective: Vital signs in last 24 hours:   Filed Weights   04/11/18 0646 04/12/18 0527 04/13/18 0412  Weight: 63 kg 63.1 kg 63.1 kg    Hemodynamic parameters for last 24 hours:    Intake/Output from previous day: 04/05 0701 - 04/06 0700 In: 180 [P.O.:180] Out: -  Intake/Output this shift: No intake/output data recorded.  Scheduled Meds: . docusate sodium  200 mg Oral Daily  . enoxaparin  1.5 mg/kg Subcutaneous Q24H  . metoCLOPramide  5 mg Oral TID AC & HS  . pantoprazole  40 mg Oral Daily   Continuous Infusions: PRN Meds:.acetaminophen, nitroGLYCERIN, ondansetron (ZOFRAN) IV, senna-docusate  General appearance: alert, cooperative and appears stated age Neurologic: intact Heart: diastolic murmur: mid diastolic 2/6, decrescendo at lower left sternal border Lungs: clear to auscultation bilaterally Abdomen: soft, non-tender; bowel sounds normal; no masses,  no organomegaly Extremities: extremities normal, atraumatic, no cyanosis or edema and Homans sign is negative, no sign of DVT  Lab Results: CBC: Recent Labs    04/11/18 0302 04/13/18 0333  WBC 6.0 5.0  HGB 12.1* 12.4*  HCT 37.6* 39.3  PLT 429* 464*   BMET:  Recent Labs    04/11/18 0302 04/13/18 0333  NA 137 136  K 4.2 4.6  CL 104 104  CO2 26 26  GLUCOSE 97 100*  BUN 11 12  CREATININE 1.23 1.29*  CALCIUM 9.0 9.1    PT/INR:  Recent Labs    04/13/18 0333  LABPROT 15.2  INR 1.2     Radiology Dg Chest 2 View  Result Date: 04/13/2018 CLINICAL DATA:  Pre operative respiratory exam. Severe aortic regurgitation. For aortic valve replacement. EXAM: CHEST - 2 VIEW COMPARISON:  Chest x-rays dated 03/28/2018 and 12/31/2017 FINDINGS: Heart size is normal. Pulmonary vascularity is normal. Lungs are clear. Slight  thoracolumbar scoliosis. No effusions. IMPRESSION: No acute abnormalities. Electronically Signed   By: Francene Boyers M.D.   On: 04/13/2018 09:12  I have independently reviewed the above radiology studies  and reviewed the findings with the patient.    Assessment/Plan:  Patient discussed with Dr Katrinka Blazing this morning. With increasing number of active covet patients  in local icu and increasing limitations of OR equipment  I have discussed delaying surgery tomorrow for 3-4 weeks , primary to prevent postop COVID  hosptial acquired infection.  Will need to be on anticoagulation , and stop preop. Discussed with patient son and wife on phone a video chat     Delight Ovens MD 04/13/2018 10:03 AM

## 2018-04-13 NOTE — Discharge Summary (Addendum)
Discharge Summary    Patient ID: Kevin Rojas MRN: [id number]; DOB: Dec 02, 1955  Admit date: 04/10/2018 Discharge date: 04/13/2018  Primary Care Provider: [name], MD  Primary Cardiologist: Tonny Bollman, MD  Primary Electrophysiologist:  Lewayne Bunting, MD   Discharge Diagnoses    Principal Problem:   Aortic regurgitation due to bicuspid aortic valve Active Problems:   Acute pulmonary embolism (HCC)   Dyspepsia --felt to be due to dysmotility  Allergies No Known Allergies  Diagnostic Studies/Procedures    None on this admission _____________   History of Present Illness     Mr.GrierAdmitted 03/24/2018 forVfib cardiac arrestat home.CPR started by wife.He was treated with CPR x3 minutes by family as well as defibrillation by EMS. He was intubated and treated with cooling protocol. He has subsequently been extubatedand treated for likely aspiration pneumonia. Noted LBBB on EKG.Work-up has included identification of severe aortic regurgitation with LV enlargement. Postarrest the patient has slowly progressed and cognitive impairment and functional ability is significantly improved. Amiodarone was discontinued on 03/25/2018 due to severe bradycardia while on hypothermia protocol.incidentally found to have PE on CT angio of chest on 04/04/18 and started on Lovenox.Cardiac cath demonstratedwidely patent coronaries with left dominant anatomy. Normal pulmonary pressures and documentation of severe aortic regurgitation. Seen by Dr Tyrone Sage with plans for AVR withprobable aortic root and/or ascending ao replacement,ideally once patient has undergone some inpatient rehab. Seen by Dr. Ladona Ridgel and recommended ICD following surgery.   Patient had 2 weeks of inpatient rehab 3/29-04/10/18.He has had improvement in activity tolerance, balance, postural control as well as ability to compensate for deficits.Patient has symptoms concerning for esophageal or GI dysmotility which  improved on reglan. Has been moving bowels.   Patient was seen by [facility] on 04/10/18 at inpatient rehab and plan for AVR next week. Transferred to floor on 6E.  Hospital Course     Consultants: Dr. Tyrone Sage, Cardiothoracic surgery  1. Bicuspid aortic valve with severe aortic insufficiency-plan is for aortic valve replacement after corona virus. Per CTS, with increasing number of active COVID 19 patients  in local icu and increasing limitations of OR equipment, Dr. Tyrone Sage discussed delaying surgery for 3-4 weeks, primarily to prevent postop COVID  hosptial acquired infection. Pt is not having heart failure symptoms currently.   2. Recent pulmonary embolus-Has been treated with enoxaparin. Will switch to Xarelto.  Pharmacy to help with transition  Will need 3-6 months of anticoagulation following aortic valve replacement. He will need to stop anticoagulation preop.   3. Status post cardiac arrest 03/24/2018 - patient will need ICD following aortic valve replacement.  Keep on life vest at home     4 GI dysmotility-continue Reglan and Protonix.  Patient has been seen by Dr. Tenny Craw today and deemed ready for discharge home. All follow up appointments have been scheduled. Discharge medications are listed below.  _____________  Discharge Vitals Blood pressure 128/69, pulse (!) 59, temperature 98.3 F (36.8 C), temperature source Oral, resp. rate 16, height 5\' 9"  (1.753 m), weight 63.1 kg, SpO2 99 %.  Filed Weights   04/11/18 0646 04/12/18 0527 04/13/18 0412  Weight: 63 kg 63.1 kg 63.1 kg    Labs & Radiologic Studies    CBC Recent Labs    04/11/18 0302 04/13/18 0333  WBC 6.0 5.0  HGB 12.1* 12.4*  HCT 37.6* 39.3  MCV 86.0 86.0  PLT 429* 464*   Basic Metabolic Panel Recent Labs    62/13/08 0302 04/13/18 0333  NA 137 136  K 4.2  4.6  CL 104 104  CO2 26 26  GLUCOSE 97 100*  BUN 11 12  CREATININE 1.23 1.29*  CALCIUM 9.0 9.1   Liver Function Tests Recent Labs     04/13/18 0333  AST 26  ALT 40  ALKPHOS 76  BILITOT 0.9  PROT 6.8  ALBUMIN 3.2*   No results for input(s): LIPASE, AMYLASE in the last 72 hours. Cardiac Enzymes No results for input(s): CKTOTAL, CKMB, CKMBINDEX, TROPONINI in the last 72 hours. BNP Invalid input(s): POCBNP D-Dimer No results for input(s): DDIMER in the last 72 hours. Hemoglobin A1C No results for input(s): HGBA1C in the last 72 hours. Fasting Lipid Panel No results for input(s): CHOL, HDL, LDLCALC, TRIG, CHOLHDL, LDLDIRECT in the last 72 hours. Thyroid Function Tests No results for input(s): TSH, T4TOTAL, T3FREE, THYROIDAB in the last 72 hours.  Invalid input(s): FREET3 _____________  Dg Chest 2 View  Result Date: 04/13/2018 CLINICAL DATA:  Pre operative respiratory exam. Severe aortic regurgitation. For aortic valve replacement. EXAM: CHEST - 2 VIEW COMPARISON:  Chest x-rays dated 03/28/2018 and 12/31/2017 FINDINGS: Heart size is normal. Pulmonary vascularity is normal. Lungs are clear. Slight thoracolumbar scoliosis. No effusions. IMPRESSION: No acute abnormalities. Electronically Signed   By: Francene Boyers M.D.   On: 04/13/2018 09:12   Dg Abd 1 View  Result Date: 04/07/2018 CLINICAL DATA:  Obstipation EXAM: ABDOMEN - 1 VIEW COMPARISON:  03/29/2018 FINDINGS: Nonobstructive bowel gas pattern. Gas and stool within nondistended large bowel. No obstruction, organomegaly or free air. IMPRESSION: No acute findings. Electronically Signed   By: Charlett Nose M.D.   On: 04/07/2018 09:39   Ct Head Wo Contrast  Result Date: 03/24/2018 CLINICAL DATA:  Cardiac arrest EXAM: CT HEAD WITHOUT CONTRAST TECHNIQUE: Contiguous axial images were obtained from the base of the skull through the vertex without intravenous contrast. COMPARISON:  None. FINDINGS: Brain: No acute territorial infarction, hemorrhage or intracranial mass. The ventricles are of normal size Vascular: No hyperdense vessels.  No unexpected calcification Skull: Normal.  Negative for fracture or focal lesion. Sinuses/Orbits: Mucosal thickening in the ethmoid sinuses. Large amount of secretions within the posterior nasopharynx with scattered hyperdense foci, possibly hemorrhage. Other: None IMPRESSION: 1. Negative non contrasted CT appearance of the brain 2. Large amount of secretions at the posterior nasopharynx, possibly with small amount of hemorrhagic secretion. Electronically Signed   By: Jasmine Pang M.D.   On: 03/24/2018 22:39   Ct Angio Chest Pe W Or Wo Contrast  Result Date: 03/24/2018 CLINICAL DATA:  Cardiac arrest.  Intubated patient. EXAM: CT ANGIOGRAPHY CHEST WITH CONTRAST TECHNIQUE: Multidetector CT imaging of the chest was performed using the standard protocol during bolus administration of intravenous contrast. Multiplanar CT image reconstructions and MIPs were obtained to evaluate the vascular anatomy. CONTRAST:  70mL OMNIPAQUE IOHEXOL 350 MG/ML SOLN COMPARISON:  Current chest radiographs. FINDINGS: Cardiovascular: There is satisfactory opacification of the pulmonary arteries to the segmental level. There is no evidence of a pulmonary embolism. Heart is mildly enlarged. No pericardial effusion. No coronary artery calcifications. Ascending aorta is dilated to 4.4 cm. Aorta is not opacified. No atherosclerotic calcifications. Mediastinum/Nodes: No neck base, no neck base or axillary masses or enlarged lymph nodes. Endotracheal tube and nasal/orogastric tube are well positioned. Subcentimeter shotty mediastinal lymph nodes. No discrete enlarged mediastinal or hilar lymph nodes. No masses. Trachea is unremarkable. Lungs/Pleura: Bilateral dependent lung consolidation. Airspace consolidation is noted in the posterior right upper lobe with a lesser degree of posterior consolidation in the  right lower lobe. There is less extensive consolidation in the posterior left upper lobe and left lower lobe. No evidence of pulmonary edema. No pleural effusion and no pneumothorax.  Upper Abdomen: No acute abnormality. Musculoskeletal: No chest wall abnormality. No acute or significant osseous findings. Review of the MIP images confirms the above findings. IMPRESSION: 1. No evidence of a pulmonary embolus. 2. Dependent consolidation in both lungs, right greater than left. A significant portion of this is likely due to multifocal pneumonia. Some of this dependent opacity, particularly in the lower lobes, is likely atelectasis. 3. There is no evidence of pulmonary edema.  No pleural effusion. 4. Endotracheal and nasogastric tubes are well position. 5. Mild cardiomegaly. 6. Dilated ascending aorta to 4.2 cm. Recommend annual imaging followup by CTA or MRA. This recommendation follows 2010 ACCF/AHA/AATS/ACR/ASA/SCA/SCAI/SIR/STS/SVM Guidelines for the Diagnosis and Management of Patients with Thoracic Aortic Disease. Circulation. 2010; 121: Z610-R604. Aortic aneurysm NOS (ICD10-I71.9) Aortic aneurysm NOS (ICD10-I71.9). Electronically Signed   By: Amie Portland M.D.   On: 03/24/2018 22:44   Dg Chest Port 1 View  Result Date: 03/28/2018 CLINICAL DATA:  Initial evaluation for acute dyspnea. EXAM: PORTABLE CHEST 1 VIEW COMPARISON:  Prior radiograph from 03/26/2018. FINDINGS: Endotracheal tube in place with tip positioned 3.2 cm above the carina. Left subclavian approach CVC in place with tip overlying the distal SVC, stable. Enteric tube courses into the abdomen. Stable cardiomegaly. Mediastinal silhouette within normal limits. Lungs hypoinflated. Veiling opacity overlying the hemidiaphragms compatible with layering effusions, right greater than left. Bilateral parenchymal infiltrates again seen involving the lungs bilaterally, right greater than left, perhaps mildly improved at the right upper lung as compared to previous. Underlying mild diffuse pulmonary interstitial congestion. No pneumothorax. Osseous structures unchanged. IMPRESSION: 1. Support apparatus in satisfactory position as above. 2.  Veiling opacities overlying the hemidiaphragms, compatible with layering pleural effusions, right worse than left. These appear slightly worsened from previous. 3. Persistent right greater than left parenchymal infiltrates, perhaps mildly improved at the right upper lobe as compared to previous. Electronically Signed   By: Rise Mu M.D.   On: 03/28/2018 05:05   Dg Chest Port 1 View  Result Date: 03/26/2018 CLINICAL DATA:  Intubation.  Respiratory failure. EXAM: PORTABLE CHEST 1 VIEW COMPARISON:  None. FINDINGS: Endotracheal tube, left subclavian line, NG tube in stable position. Bilateral pulmonary infiltrates particularly prominent throughout the right lung again noted. No interim change. Small right pleural effusion can not be excluded. Scratched it small bilateral pleural effusions could not be excluded. No pneumothorax. IMPRESSION: 1.  Lines and tubes in stable position. 2. Bilateral pulmonary infiltrates particularly prominent throughout the right lung again noted. No significant interim change. Small bilateral pleural effusions could not be excluded. Electronically Signed   By: Maisie Fus  Register   On: 03/26/2018 06:46   Dg Chest Port 1 View  Result Date: 03/25/2018 CLINICAL DATA:  Endotracheal intubation EXAM: PORTABLE CHEST 1 VIEW COMPARISON:  Yesterday FINDINGS: Endotracheal tube tip just below the clavicular heads. The orogastric tube at least reaches the stomach. Left subclavian line with tip at the SVC. Extensive lung opacity greater on the right correlating with atelectasis and consolidation by CT. There has been mild progression since yesterday. No visible pneumothorax. IMPRESSION: 1. Unremarkable hardware positioning. 2. History of aspiration pneumonia with mildly increased opacity. Electronically Signed   By: Marnee Spring M.D.   On: 03/25/2018 06:47   Dg Chest Port 1 View  Result Date: 03/24/2018 CLINICAL DATA:  Status post central line placement.  EXAM: PORTABLE CHEST 1 VIEW  COMPARISON:  03/24/2018 at 2008 hours FINDINGS: New left subclavian central venous line has its tip projecting in the lower superior vena cava. Endotracheal tube is stable and well positioned. Hazy lung opacities noted previously are not significantly changed, allowing for differences in patient positioning and technique. No pneumothorax. IMPRESSION: 1. Left subclavian central venous line has its tip in the lower superior vena cava. No pneumothorax. 2. No other change from the earlier exam. Electronically Signed   By: David  Ormond M.D.   On: 03/24/2018 22:13   Dg Chest Portable 1 View  Result Date: 03/24/2018 CLINICALMarland [id number] DALenox P[place] CPR, check endotracheal [id number] [id number] >FINDINGS Cardiovascular: The aortic root is mildly dilated and measures approximately 4.1-4.2 cm at the level of the sinuses of Valsalva. The aortic valve shows mild central calcification. The ascending thoracic aorta is mildly dilated and measures 4.2-4.3 cm in greatest diameter. The proximal arch measures approximately 3.3 cm. The distal arch demonstrates severe tortuosity and focal kinking prior to an area of mild dilatation measuring 3.2 cm. The descending thoracic aorta then tapers to a diameter of 2.8 cm. There is no evidence of aortic dissection. Proximal great vessels demonstrate normal branching anatomy and normal patency without aneurysmal disease. The heart is mildly enlarged. The left ventricle appears dilated. Trace anterior pericardial fluid without significant pericardial effusion. No significant calcified coronary artery plaque is identified by CT. Although not optimized for pulmonary arterial evaluation, the pulmonary arteries are quite well opacified on the study and there is a new finding of definitive nonocclusive pulmonary embolism in the right lung with nonocclusive thrombus identified at a bifurcation of the right lower lobe  pulmonary artery into  segmental branches and within a right middle lobe pulmonary artery branch. No left-sided pulmonary embolism is identified. There is no evidence of central PE or saddle embolism. Mediastinum/Nodes: No enlarged mediastinal, hilar, or axillary lymph nodes. Thyroid gland, trachea, and esophagus demonstrate no significant findings. Lungs/Pleura: Mild scarring/atelectasis at both lung bases. There is no evidence of pulmonary edema, consolidation, pneumothorax, nodule or pleural fluid. No evidence of focal pulmonary infarction. Musculoskeletal: No chest wall abnormality. No acute or significant osseous findings. Review of the MIP images confirms the above findings. CTA ABDOMEN AND PELVIS FINDINGS VASCULAR Aorta: The abdominal aorta is normally patent and of normal caliber without evidence of aneurysm or dissection. No significant atherosclerosis. Celiac: Normally patent.  Normal branch vessel anatomy. SMA: Normally patent. Renals: A single right renal artery and 2 separate left renal arteries with small accessory upper pole artery demonstrate normal patency. IMA: Normally patent. Inflow: Bilateral iliac arteries demonstrate normal patency. Common femoral arteries and femoral bifurcations are widely patent. Review of the MIP images confirms the above findings. NON-VASCULAR Hepatobiliary: No focal liver abnormality is seen. No gallstones, gallbladder wall thickening, or biliary dilatation. Pancreas: Unremarkable. No pancreatic ductal dilatation or surrounding inflammatory changes. Spleen: Normal in size without focal abnormality. Adrenals/Urinary Tract: Adrenal glands are unremarkable. Kidneys are normal, without renal calculi, focal lesion, or hydronephrosis. Bladder is unremarkable. Stomach/Bowel: Bowel shows no evidence of obstruction, ileus or inflammation. No free air identified. Lymphatic: No enlarged lymph nodes identified. Reproductive: Prostate is unremarkable. Other: No hernias identified.  No  free fluid or focal abscess. Musculoskeletal: Mild degenerative disc disease at L4-5 and L5-S1. Review of the MIP images confirms the above findings. IMPRESSION: 1. New, acute subsegmental nonocclusive pulmonary embolism in branches of the right lower lobe and right middle lobe pulmonary arteries. Overall volume of thrombus burden is low. This thrombus was not present on the CTA dated 03/24/2018. 2. Mild aneurysmal disease involving the aortic root and ascending thoracic aorta with the root measuring 4.2 cm in greatest diameter and the ascending thoracic aorta measuring 4.3 cm in greatest diameter. Associated partially calcified aortic valve. The aortic arch is very tortuous distally and demonstrates some degree of focal kinking due to tortuosity without evidence of significant stenosis. No evidence of aortic dissection. 3. Left ventricular cavity dilatation. 4. No evidence of aneurysmal disease or significant obstructive disease involving the abdominal aorta, iliac arteries, common femoral arteries or visceral branches of the abdominal aorta. These results were called by telephone at the time of interpretation on 04/04/2018 at 12:58 pm to Dr. Noralee Stain, who verbally acknowledged these results. Electronically Signed   By: Irish Lack M.D.   On: 04/04/2018 13:10   Vas US Doppler Pre Cabg  Result Date: 04/02/2018 PREOPERATIVE VASCULAR EVALUATION  Indications: Pre cabg. Performing Technologist: Olen Cordial Rvt  Examination Guidelines: A complete evaluation includes B-mode imaging, spectral Doppler, color Doppler, and power Doppler as needed of all accessible portions of each vessel. Bilateral testing is considered an integral part of a complete examination. Limited examinations for reoccurring indications may be performed as noted.  Right Carotid Findings: +----------+--------+--------+--------+-----------+--------+             PSV cm/s EDV cm/s Stenosis Describe    Comments   +----------+--------+--------+--------+-----------+--------+  CCA Prox   91                         homogeneous           +----------+--------+--------+--------+-----------+--------+  CCA Distal 63  12                homogeneous           +----------+--------+--------+--------+-----------+--------+  ICA Prox   67       17       1-39%    homogeneous           +----------+--------+--------+--------+-----------+--------+  ICA Distal 69       17                                      +----------+--------+--------+--------+-----------+--------+  ECA        106                                              +----------+--------+--------+--------+-----------+--------+ +----------+--------+-------+--------+------------+             PSV cm/s EDV cms Describe Arm Pressure  +----------+--------+-------+--------+------------+  Subclavian 118                       119           +----------+--------+-------+--------+------------+ +---------+--------+--+--------+-+---------+  Vertebral PSV cm/s 51 EDV cm/s 8 Antegrade  +---------+--------+--+--------+-+---------+ Left Carotid Findings: +----------+--------+--------+--------+-----------+--------+             PSV cm/s EDV cm/s Stenosis Describe    Comments  +----------+--------+--------+--------+-----------+--------+  CCA Prox   87                         homogeneous           +----------+--------+--------+--------+-----------+--------+  CCA Distal 93                         homogeneous           +----------+--------+--------+--------+-----------+--------+  ICA Prox   68       12       1-39%    homogeneous           +----------+--------+--------+--------+-----------+--------+  ICA Distal 37       12                                      +----------+--------+--------+--------+-----------+--------+  ECA        83                                               +----------+--------+--------+--------+-----------+--------+ +----------+--------+--------+--------+------------+   Subclavian PSV cm/s EDV cm/s Describe Arm Pressure  +----------+--------+--------+--------+------------+             127                        127           +----------+--------+--------+--------+------------+ +---------+--------+--+--------+--+---------+  Vertebral PSV cm/s 49 EDV cm/s 13 Antegrade  +---------+--------+--+--------+--+---------+  ABI Findings: +--------+------------------+-----+---------+--------+  Right    Rt Pressure (mmHg) Index Waveform  Comment   +--------+------------------+-----+---------+--------+  Brachial 119  triphasic           +--------+------------------+-----+---------+--------+  PTA      175                1.38  triphasic           +--------+------------------+-----+---------+--------+  DP       193                1.52  triphasic           +--------+------------------+-----+---------+--------+ +--------+------------------+-----+---------+-------+  Left     Lt Pressure (mmHg) Index Waveform  Comment  +--------+------------------+-----+---------+-------+  Brachial 127                      triphasic          +--------+------------------+-----+---------+-------+  PTA      180                1.42  triphasic          +--------+------------------+-----+---------+-------+  DP       197                1.55  triphasic          +--------+------------------+-----+---------+-------+ +-------+---------------+----------------+  ABI/TBI Today's ABI/TBI Previous ABI/TBI  +-------+---------------+----------------+  Right   1.52                              +-------+---------------+----------------+  Left    1.55                              +-------+---------------+----------------+  Right Doppler Findings: +-----------+--------+-----+---------+-----------------------------------------+  Site        Pressure Index Doppler   Comments                                   +-----------+--------+-----+---------+-----------------------------------------+  Brachial    119             triphasic                                            +-----------+--------+-----+---------+-----------------------------------------+  Radial                     triphasic                                            +-----------+--------+-----+---------+-----------------------------------------+  Ulnar                      triphasic                                            +-----------+--------+-----+---------+-----------------------------------------+  Palmar Arch                          Palmar waveforms are obliterated with  radial and ulnar compression.              +-----------+--------+-----+---------+-----------------------------------------+  Left Doppler Findings: +-----------+--------+-----+---------+-----------------------------------------+  Site        Pressure Index Doppler   Comments                                   +-----------+--------+-----+---------+-----------------------------------------+  Brachial    127            triphasic                                            +-----------+--------+-----+---------+-----------------------------------------+  Radial                     triphasic                                            +-----------+--------+-----+---------+-----------------------------------------+  Ulnar                      triphasic                                            +-----------+--------+-----+---------+-----------------------------------------+  Palmar Arch                          Palmar waveforms are obliterated with                                            radial and ulnar compression.              +-----------+--------+-----+---------+-----------------------------------------+  Summary: Right Carotid: Velocities in the right ICA are consistent with a 1-39% stenosis. Left Carotid: Velocities in the left ICA are consistent with a 1-39% stenosis. Vertebrals: Bilateral vertebral arteries demonstrate antegrade flow. Right ABI:  Resting right ankle-brachial index indicates noncompressible right lower extremity arteries. Left ABI: Resting left ankle-brachial index indicates noncompressible left lower extremity arteries.  Electronically signed by Sherald Hess MD on 04/02/2018 at 5:46:33 PM.    Final    Disposition   Pt is being discharged home today in good condition.  Follow-up Plans & Appointments    Follow-up Information    Tonny Bollman, MD Follow up.   Specialty:  Cardiology Why:  Cardiology hospital follow up on 04/27/2018 at 8:20 am. This will be a video visit from home. Please see attached instructions for the visit.  Contact information: [address] [place] [address] [phone]        Delight Ovens, MD Follow up.   Specialty:  Cardiothoracic Surgery Why:  The heart surgeon's office will call you to schedule follow up to plan for surgery.  Contact information: [address] [place] [address] [phone]        [name], MD Follow up.   Specialty:  Family Medicine Why:  Please follow up with your primary care provider for slow gastric motility.  Contact  information: [address] [place] [address] [phone]        Marinus Maw, MD .   Specialty:  Cardiology Contact information: [phone] [address] [place] [address] [phone]          Discharge Instructions    (HEART FAILURE PATIENTS) Call MD:  Anytime you have any of the following symptoms: 1) 3 pound weight gain in 24 hours or 5 pounds in 1 week 2) shortness of breath, with or without a dry hacking cough 3) swelling in the hands, feet or stomach 4) if you have to sleep on extra pillows at night in order to breathe.   Complete by:  As directed    Diet - low sodium heart healthy   Complete by:  As directed    Increase activity slowly   Complete by:  As directed       Discharge Medications   Allergies as of 04/13/2018     No Known Allergies     Medication List    STOP taking these medications   enoxaparin 100 MG/ML injection Commonly known as:  LOVENOX     TAKE these medications   acetaminophen 325 MG tablet Commonly known as:  TYLENOL Take 2 tablets (650 mg total) by mouth every 4 (four) hours as needed for headache or mild pain. What changed:    how much to take  reasons to take this   metoCLOPramide 5 MG tablet Commonly known as:  REGLAN Take 1 tablet (5 mg total) by mouth 4 (four) times daily -  before meals and at bedtime.   pantoprazole 40 MG tablet Commonly known as:  PROTONIX Take 1 tablet (40 mg total) by mouth daily.   Rivaroxaban 15 & 20 MG Tbpk Start with one 15mg  tablet by mouth twice a day with food. On Day 22, switch to one 20mg  tablet once a day with food.   rivaroxaban 20 MG Tabs tablet Commonly known as:  XARELTO Take 1 tablet (20 mg total) by mouth daily with supper. Notes to patient:  This is to start on 05/14/18 after completion of the 1 month dose pack.    senna-docusate 8.6-50 MG tablet Commonly known as:  Senokot-S Take 1 tablet by mouth at bedtime as needed for mild constipation.        Acute coronary syndrome (MI, NSTEMI, STEMI, etc) this admission?: No.    Outstanding Labs/Studies   None  Duration of Discharge Encounter   Greater than 30 minutes including physician time.  Signed, Berton Bon, NP 04/13/2018, 2:42 PM

## 2018-04-13 NOTE — Telephone Encounter (Signed)
   TELEPHONE CALL NOTE  This patient has been deemed a candidate for follow-up tele-health visit to limit community exposure during the Covid-19 pandemic. I spoke with the patient via phone to discuss instructions. This has been outlined on the patient's AVS (dotphrase: hcevisitinfo). The patient was advised to review the section on consent for treatment as well. The patient will receive a phone call 2-3 days prior to their E-Visit at which time consent will be verbally confirmed. A Virtual Office Visit appointment type has been scheduled for 04/27/18 at 8:20 am with Dr. Excell Seltzer, with "VIDEO" or "TELEPHONE" in the appointment notes - patient prefers Video type.  I have either confirmed the patient is active in MyChart or offered to send sign-up link to phone/email via Mychart icon beside patient's photo.  Berton Bon, NP 04/13/2018 1:31 PM

## 2018-04-13 NOTE — TOC Benefit Eligibility Note (Signed)
Transition of Care [facility]) Benefit Eligibility Note    Patient Details  Name: Domminic Kren MRN: [id number] Date of Birth: [birth year]   Medication/Dose: Carlena Hurl 15 MG BID  Covered?: Yes     Prescription Coverage Preferred Pharmacy: YES([facility])  Spoke with Person/Company/Phone Number:: AMANDA ([employer] # [phone])  Co-Pay: Alvester Morin  Prior Approval: No          Mardene Sayer Phone Number: 04/13/2018, 1:42 PM

## 2018-04-14 ENCOUNTER — Inpatient Hospital Stay (HOSPITAL_COMMUNITY)
Admission: RE | Admit: 2018-04-14 | Payer: BLUE CROSS/BLUE SHIELD | Source: Ambulatory Visit | Admitting: Cardiothoracic Surgery

## 2018-04-14 SURGERY — REPLACEMENT, AORTIC VALVE, OPEN
Anesthesia: General | Site: Chest

## 2018-04-14 NOTE — Telephone Encounter (Signed)
[facility]2020.  Patient understands to follow up with provider Dr Excell Seltzer on Monday April 27, 2018 at 8:40 AM at Virtual Visit. Patient understands discharge instructions? yes Patient understands medications and regiment? yes Patient understands to bring all medications to this visit? Virtual Visit Pt understands to have  daily weights, BP and medications at time of Virtual Visit.  I obtained email address and sent patient MyChart Email Signup so patient could activate MyChart.

## 2018-04-16 ENCOUNTER — Telehealth: Payer: Self-pay

## 2018-04-16 NOTE — Telephone Encounter (Signed)
LMOM for Mr Kaupp to call back with a update on how he is doing since hospital discharge.

## 2018-04-16 NOTE — Telephone Encounter (Signed)
Spoke with Kevin Rojas, He states that he is doing well.  His incisions look good, he has been up walking daily and using his incentive spirometer. He denies any fevers, shortness of breath, cough, swelling or constipation.  He has not had much pain and his appetite is good. He was instructed to call our office back if he develops any issue or has questions.

## 2018-04-21 ENCOUNTER — Telehealth: Payer: Self-pay | Admitting: Cardiovascular Disease

## 2018-04-21 NOTE — Telephone Encounter (Signed)
° °  TELEPHONE CALL NOTE  This patient has been deemed a candidate for follow-up tele-health visit to limit community exposure during the Covid-19 pandemic. I spoke with the patient via phone to discuss instructions. This has been outlined on the patient's AVS (dotphrase: hcevisitinfo). The patient was advised to review the section on consent for treatment as well. The patient will receive a phone call 2-3 days prior to their E-Visit at which time consent will be verbally confirmed. A Virtual Office Visit appointment type has been scheduled for 04/27/2018 with Dr Excell Seltzer , with "VIDEO" or "TELEPHONE" in the appointment notes - patient prefers Video (Doximity) type.  I have either confirmed the patient is active in MyChart or offered to send sign-up link to phone/email via Mychart icon beside patient's photo. 04/21/18 patient gave verbal consent (wife will set up Mychart this afternoon)  Kevin Rojas 04/21/2018 10:08 AM

## 2018-04-21 NOTE — Telephone Encounter (Signed)
Patient is filling for his disability- and need paper work filled out - gave patient number to medical records to speak with them on getting release of information form filled out .

## 2018-04-21 NOTE — Telephone Encounter (Signed)
Patient set up for MyChart? Wife will set up this afternoon  Is patient using Smartphone/computer/tablet android smart phone  Did audio/video work?Doximity - did not test   Does patient need telephone visit? no  Best phone number to use? [phone]  Special Instructions? appt is pre-registered, consent given,

## 2018-04-21 NOTE — Telephone Encounter (Signed)
New Message  Patient needs information to fill out disability papers.

## 2018-04-27 ENCOUNTER — Telehealth (INDEPENDENT_AMBULATORY_CARE_PROVIDER_SITE_OTHER): Payer: BLUE CROSS/BLUE SHIELD | Admitting: Cardiovascular Disease

## 2018-04-27 ENCOUNTER — Encounter: Payer: Self-pay | Admitting: Cardiovascular Disease

## 2018-04-27 ENCOUNTER — Other Ambulatory Visit: Payer: Self-pay

## 2018-04-27 VITALS — BP 158/93 | HR 64 | Ht 69.0 in | Wt 146.4 lb

## 2018-04-27 DIAGNOSIS — I351 Nonrheumatic aortic (valve) insufficiency: Secondary | ICD-10-CM

## 2018-04-27 DIAGNOSIS — I2693 Single subsegmental pulmonary embolism without acute cor pulmonale: Secondary | ICD-10-CM

## 2018-04-27 DIAGNOSIS — I469 Cardiac arrest, cause unspecified: Secondary | ICD-10-CM

## 2018-04-27 NOTE — Progress Notes (Signed)
Virtual Visit via Video Note   This visit type was conducted due to national recommendations for restrictions regarding the COVID-19 Pandemic (e.g. social distancing) in an effort to limit this patient's exposure and mitigate transmission in our community.  Due to his co-morbid illnesses, this patient is at least at moderate risk for complications without adequate follow up.  This format is felt to be most appropriate for this patient at this time.  All issues noted in this document were discussed and addressed.  A limited physical exam was performed with this format.  Please refer to the patient's chart for his consent to telehealth for [facility].   Evaluation Performed:  Follow-up visit  Date:  04/27/2018   ID:  Kevin Rojas, DOB 12-25-1955, MRN [id number]  Patient Location: Home Provider Location: Home  PCP:  [name], MD  Cardiologist:  Tonny Bollman, MD  Electrophysiologist:  Lewayne Bunting, MD   Chief Complaint:  Aortic insufficiency  History of Present Illness:    Kevin Rojas is a 63 y.o. male with history of recent out of hospital ventricular fibrillation cardiac arrest March 24, 2018 followed by prolonged hospitalization.  The patient had a witnessed cardiac arrest and his wife performed bystander CPR until EMS arrived, promptly defibrillating the patient and resuscitating him.  He underwent therapeutic hypothermia per protocol.  During his hospitalization, he was noted to have mild LV systolic dysfunction with dilatation of the left ventricle and severe aortic valve insufficiency.  He is found to have a bicuspid aortic valve and dilated ascending aorta.  Cardiac catheterization demonstrated widely patent coronary arteries and normal hemodynamics.  The patient slowly improved with respect to his anoxic encephalopathy and ultimately was discharged home on April 13, 2018 after inpatient rehab.  During his hospitalization he was evaluated by Dr. Ladona Ridgel who recommended a  LifeVest and ultimately will plan on ICD insertion.  Dr. Tyrone Sage saw him in cardiac surgical consultation and plans on treating him with aortic valve replacement plus/minus ascending aortic replacement electively.  The patient does not have symptoms concerning for COVID-19 infection (fever, chills, cough, or new shortness of breath).   I initially tried to do a video visit and I was able to see and hear the patient, but he couldn't get the audio or visual to work on his end so we converted to a phone visit.   He has started doing some light work in his yard and also doing some walking without symptoms.  Overall he feels he is doing very well.  He denies symptoms of chest pain or shortness of breath.  He denies orthopnea, PND, or leg swelling.  He has had no heart palpitations, lightheadedness, or syncope.  He is tolerating rivaroxaban without bleeding problems.  He specifically denies hematuria or blood in his stool.  He has been compliant with his LifeVest.  In reviewing his symptoms before his cardiac arrest, he appeared to have a normal functional capacity without exertional chest pain or shortness of breath.  In fact, he worked extremely hard over the years, frequently working 16 hours/day.  He worked a second job where he would have to wake up at 1:30 in the morning to be at work at 2 AM.  No past medical history on file. Past Surgical History:  Procedure Laterality Date   AORTIC ARCH ANGIOGRAPHY N/A 04/02/2018   Procedure: AORTIC ARCH ANGIOGRAPHY;  Surgeon: Lennette Bihari, MD;  Location: [facility] INVASIVE CV LAB;  Service: Cardiovascular;  Laterality: N/A;   RIGHT/LEFT HEART  CATH AND CORONARY ANGIOGRAPHY N/A 04/02/2018   Procedure: RIGHT/LEFT HEART CATH AND CORONARY ANGIOGRAPHY;  Surgeon: Lennette Bihari, MD;  Location: [facility] INVASIVE CV LAB;  Service: Cardiovascular;  Laterality: N/A;     Current Meds  Medication Sig   acetaminophen (TYLENOL) 325 MG tablet Take 2 tablets (650 mg total) by mouth  every 4 (four) hours as needed for headache or mild pain.   metoCLOPramide (REGLAN) 5 MG tablet Take 1 tablet (5 mg total) by mouth 4 (four) times daily -  before meals and at bedtime.   pantoprazole (PROTONIX) 40 MG tablet Take 1 tablet (40 mg total) by mouth daily.   Rivaroxaban (XARELTO) 20 MG TABS tablet Take 1 tablet (20 mg total) by mouth daily with supper.   Rivaroxaban 15 & 20 MG TBPK Start with one  tablet by mouth twice a day with food. On Day 22, switch to one  tablet once a day with food.   senna-docusate (SENOKOT-S) 8.6-50 MG tablet Take 1 tablet by mouth at bedtime as needed for mild constipation.     Allergies:   Patient has no known allergies.   Social History   Tobacco Use   Smoking status: Never Smoker   Smokeless tobacco: Never Used  Substance Use Topics   Alcohol use: Never    Frequency: Never   Drug use: Never     Family Hx: The patient's family history includes Prostate cancer in his father; Pulmonary embolism in his mother.  ROS:   Please see the history of present illness.    All other systems reviewed and are negative.   Prior CV studies:   The following studies were reviewed today:  2D echocardiogram 03/25/2018: IMPRESSIONS    1. The left ventricle has mildly reduced systolic function, with an ejection fraction of 45-50%. The cavity size was mildly dilated. Left ventricular diastolic Doppler parameters are consistent with pseudonormalization.  2. The right ventricle has normal systolic function. The cavity was normal. There is no increase in right ventricular wall thickness.  3. Right atrial size was mildly dilated.  4. Trivial pericardial effusion is present.  5. Mild thickening of the mitral valve leaflet.  6. The aortic valveis bicuspid Aortic valve regurgitation is severe by color flow Doppler.  7. The aortic valve appears to be a bicuspid or functionally bicuspid AV. There is severe AI.     The aortic root is mildly  dilated.  8. There is mild dilatation of the ascending aorta.  9.     The aortic valve appears to be a bicuspid or functionally bicuspid AV. There is severe AI.     The aortic root is mildly dilated .     There is concern for a Type A aortic dissection.     Suggest STAT CT angiogram of the chest for further evaluation.           10. The inferior vena cava was normal in size with <50% respiratory variability. 11. The interatrial septum was not assessed.  FINDINGS  Left Ventricle: The left ventricle has mildly reduced systolic function, with an ejection fraction of 45-50%. The cavity size was mildly dilated. There is no increase in left ventricular wall thickness. Left ventricular diastolic Doppler parameters are  consistent with pseudonormalization. Right Ventricle: The right ventricle has normal systolic function. The cavity was normal. There is no increase in right ventricular wall thickness. Left Atrium: left atrial size was normal in size Right Atrium: right atrial size was mildly dilated. Right  atrial pressure is estimated at 8 mmHg. Interatrial Septum: The interatrial septum was not assessed. Pericardium: Trivial pericardial effusion is present. Mitral Valve: The mitral valve is normal in structure. Mild thickening of the mitral valve leaflet. Mitral valve regurgitation is trivial by color flow Doppler. Tricuspid Valve: The tricuspid valve is normal in structure. Tricuspid valve regurgitation is mild by color flow Doppler. Aortic Valve: The aortic valveis bicuspid Aortic valve regurgitation is severe by color flow Doppler. The aortic valve appears to be a bicuspid or functionally bicuspid AV. There is severe AI. The aortic root is mildly dilated.   Pulmonic Valve: The pulmonic valve was normal in structure. Pulmonic valve regurgitation is not visualized by color flow Doppler. Aorta: There is mild dilatation of the ascending aorta. The aortic valve appears to be a bicuspid or  functionally bicuspid AV. There is severe AI. The aortic root is mildly dilated . There is concern for a Type A aortic dissection. Suggest STAT CT angiogram of the chest for further evaluation.   Venous: The inferior vena cava is normal in size with less than 50% respiratory variability.   Cardiac catheterization 04/02/2018: Conclusion     There is mild left ventricular systolic dysfunction.  LV end diastolic pressure is normal.  The left ventricular ejection fraction is 45-50% by visual estimate.  There is severe (4+) aortic regurgitation.   Normal right heart pressures.  Mild global LV dysfunction with an EF 45 to 50%.  Bicuspid aortic valve with severe 4+ aortic insufficiency, mild aortic stenosis.  Dilated aortic root.  Normal coronary arteries and a left dominant circulation.  RECOMMENDATION: The patient will ultimately need surgical consultation for aortic valve repair/replacement.    CT angiogram of the chest and abdomen 04/04/2018: IMPRESSION: 1. New, acute subsegmental nonocclusive pulmonary embolism in branches of the right lower lobe and right middle lobe pulmonary arteries. Overall volume of thrombus burden is low. This thrombus was not present on the CTA dated 03/24/2018. 2. Mild aneurysmal disease involving the aortic root and ascending thoracic aorta with the root measuring 4.2 cm in greatest diameter and the ascending thoracic aorta measuring 4.3 cm in greatest diameter. Associated partially calcified aortic valve. The aortic arch is very tortuous distally and demonstrates some degree of focal kinking due to tortuosity without evidence of significant stenosis. No evidence of aortic dissection. 3. Left ventricular cavity dilatation. 4. No evidence of aneurysmal disease or significant obstructive disease involving the abdominal aorta, iliac arteries, common femoral arteries or visceral branches of the abdominal aorta.  Labs/Other Tests and Data  Reviewed:    EKG:  An ECG dated April 01, 2018 was personally reviewed today and demonstrated:  Normal sinus rhythm 65 bpm with left bundle branch block  Recent Labs: 03/25/2018: TSH 1.863 04/02/2018: Magnesium 2.0 04/13/2018: ALT 40; BUN 12; Creatinine, Ser 1.29; Hemoglobin 12.4; Platelets 464; Potassium 4.6; Sodium 136   Recent Lipid Panel No results found for: CHOL, TRIG, HDL, CHOLHDL, LDLCALC, LDLDIRECT  Wt Readings from Last 3 Encounters:  04/27/18 146 lb 6.4 oz (66.4 kg)  04/13/18 139 lb 1.6 oz (63.1 kg)  04/05/18 143 lb 1.3 oz (64.9 kg)     Objective:    Vital Signs:  BP (!) 158/93 (BP Location: Left Arm, Patient Position: Sitting, Cuff Size: Normal)    Pulse 64    Ht 5\' 9"  (1.753 m)    Wt 146 lb 6.4 oz (66.4 kg)    BMI 21.62 kg/m    VITAL SIGNS:  reviewed Remaining exam  is not performed today as this is a telephone visit.  ASSESSMENT & PLAN:    1. Severe aortic insufficiency: The patient appears stable with New York Heart Association functional class I symptoms at present.  Plans noted for elective surgical aortic valve replacement once COVID-19 restrictions are lifted.  Discussed natural history of aortic insufficiency with the patient including associated findings of mild LV dysfunction and LV dilatation. 2. Out of hospital cardiac arrest: The patient has made an excellent recovery.  His cognition appears fully intact. 3. Ventricular fibrillation: The patient is wearing a LifeVest.  He has had no problems with this.  No recurrent symptoms of arrhythmia.  He has follow-up with Dr. Ladona Ridgel later this week. 4. Pulmonary embolus: The patient is tolerating rivaroxaban without bleeding problems.  His CTA study from the hospital is reviewed.  He denies symptoms of shortness of breath at present.  COVID-19 Education: The signs and symptoms of COVID-19 were discussed with the patient and how to seek care for testing (follow up with PCP or arrange E-visit).  The importance of social  distancing was discussed today.  Time:   Today, I have spent 30 minutes with the patient with telehealth technology discussing the above problems.     Medication Adjustments/Labs and Tests Ordered: Current medicines are reviewed at length with the patient today.  Concerns regarding medicines are outlined above.   Tests Ordered: No orders of the defined types were placed in this encounter.   Medication Changes: No orders of the defined types were placed in this encounter.   Disposition:  Follow up Will arrange after cardiac surgery  Signed, Tonny Bollman, MD  04/27/2018 9:15 AM    [facility]

## 2018-04-29 ENCOUNTER — Other Ambulatory Visit: Payer: Self-pay

## 2018-04-29 ENCOUNTER — Telehealth: Payer: Self-pay | Admitting: Internal Medicine

## 2018-04-29 ENCOUNTER — Telehealth: Payer: BLUE CROSS/BLUE SHIELD | Admitting: Internal Medicine

## 2018-04-29 DIAGNOSIS — I447 Left bundle-branch block, unspecified: Secondary | ICD-10-CM | POA: Diagnosis not present

## 2018-04-29 DIAGNOSIS — I351 Nonrheumatic aortic (valve) insufficiency: Secondary | ICD-10-CM | POA: Diagnosis not present

## 2018-04-29 DIAGNOSIS — Z8679 Personal history of other diseases of the circulatory system: Secondary | ICD-10-CM | POA: Diagnosis not present

## 2018-04-29 DIAGNOSIS — Z8674 Personal history of sudden cardiac arrest: Secondary | ICD-10-CM | POA: Diagnosis not present

## 2018-04-29 NOTE — Progress Notes (Unsigned)
Electrophysiology TeleHealth Note   Due to national recommendations of social distancing due to COVID 19, an audio/video telehealth visit is felt to be most appropriate for this patient at this time.  See MyChart message from today for the patient's consent to telehealth for [facility].   Date:  04/29/2018   ID:  Kevin Rojas, DOB 08/06/1955, MRN [id number]  Location: patient's home  Provider location: [address], [place]  Evaluation Performed: Follow-up visit  PCP:  [name], MD  Cardiologist:  Tonny Bollman, MD  Electrophysiologist:  Dr Ladona Ridgel  Chief Complaint:  ***  History of Present Illness:    Kevin Rojas is a 63 y.o. male who presents via audio/video conferencing for a telehealth visit today. Kevin Rojas is a pleasant 63 yo man with a h/o VF arrest s/p resuscitation who was found to have severe AI and a bicuspid aortic valve. The patient was discharged home with a Life Vest and plans for AVR and possible aortic root replacment. In the interim, he has ,  Today, he denies symptoms of palpitations, chest pain, shortness of breath,  lower extremity edema, dizziness, presyncope, or syncope.  The patient is otherwise without complaint today.  The patient denies symptoms of fevers, chills, cough, or new SOB worrisome for COVID 19.  No past medical history on file.  Past Surgical History:  Procedure Laterality Date  . AORTIC ARCH ANGIOGRAPHY N/A 04/02/2018   Procedure: AORTIC ARCH ANGIOGRAPHY;  Surgeon: Lennette Bihari, MD;  Location: [facility] INVASIVE CV LAB;  Service: Cardiovascular;  Laterality: N/A;  . RIGHT/LEFT HEART CATH AND CORONARY ANGIOGRAPHY N/A 04/02/2018   Procedure: RIGHT/LEFT HEART CATH AND CORONARY ANGIOGRAPHY;  Surgeon: Lennette Bihari, MD;  Location: [facility] INVASIVE CV LAB;  Service: Cardiovascular;  Laterality: N/A;    Current Outpatient Medications  Medication Sig Dispense Refill  . acetaminophen (TYLENOL) 325 MG tablet Take 2 tablets (650  mg total) by mouth every 4 (four) hours as needed for headache or mild pain.    Marland Kitchen metoCLOPramide (REGLAN) 5 MG tablet Take 1 tablet (5 mg total) by mouth 4 (four) times daily -  before meals and at bedtime. 120 tablet 1  . pantoprazole (PROTONIX) 40 MG tablet Take 1 tablet (40 mg total) by mouth daily. 30 tablet 0  . Rivaroxaban (XARELTO) 20 MG TABS tablet Take 1 tablet (20 mg total) by mouth daily with supper. 30 tablet 3  . Rivaroxaban 15 & 20 MG TBPK Start with one 15mg  tablet by mouth twice a day with food. On Day 22, switch to one 20mg  tablet once a day with food. 51 each 0  . senna-docusate (SENOKOT-S) 8.6-50 MG tablet Take 1 tablet by mouth at bedtime as needed for mild constipation. 30 tablet 1   No current facility-administered medications for this visit.     Allergies:   Patient has no known allergies.   Social History:  The patient  reports that he has never smoked. He has never used smokeless tobacco. He reports that he does not drink alcohol or use drugs.   Family History:  The patient's *** family history includes Prostate cancer in his father; Pulmonary embolism in his mother.   ROS:  Please see the history of present illness.   All other systems are personally reviewed and negative.    Exam:    Vital Signs:  There were no vitals taken for this visit.  Well appearing, alert and conversant, regular work of breathing,  good  skin color Eyes- anicteric, neuro- grossly intact, skin- no apparent rash or lesions or cyanosis, mouth- oral mucosa is pink   Labs/Other Tests and Data Reviewed:    Recent Labs: 03/25/2018: TSH 1.863 04/02/2018: Magnesium 2.0 04/13/2018: ALT 40; BUN 12; Creatinine, Ser 1.29; Hemoglobin 12.4; Platelets 464; Potassium 4.6; Sodium 136   Wt Readings from Last 3 Encounters:  04/27/18 146 lb 6.4 oz (66.4 kg)  04/13/18 139 lb 1.6 oz (63.1 kg)  04/05/18 143 lb 1.3 oz (64.9 kg)     Other studies personally reviewed: Additional studies/ records that were  reviewed today include: ***  Review of the above records today demonstrates: as above Prior radiographs: ***  The patient presents wearable device technology report for my review today. On my review, the patient presents with Apple Watch tracings from *** . The tracings reveal ***  Last device remote is reviewed from [employer] PDF dated *** which reveals normal device function, no arrhythmias ***   ASSESSMENT & PLAN:    1.  ***   COVID 19 screen The patient denies symptoms of COVID 19 at this time.  The importance of social distancing was discussed today.  Follow-up:  *** Next remote: ***  Current medicines are reviewed at length with the patient today.   The patient {ACTIONS; HAS/DOES NOT HAVE:19233} concerns regarding his medicines.  The following changes were made today:  {NONE DEFAULTED:18576::"none"}  Labs/ tests ordered today include: *** No orders of the defined types were placed in this encounter.    Patient Risk:  after full review of this patients clinical status, I feel that they are at moderate risk at this time.  Today, I have spent *** minutes with the patient with telehealth technology discussing *** .    Signed, Lewayne Bunting, MD  04/29/2018 10:52 AM     [facility] [address] [place] [address] [phone] (office) [phone] (fax)

## 2018-04-29 NOTE — Telephone Encounter (Signed)
Call returned to Pt.  Phone number corrected in appt  Notes.  Attempted to walk through doximity video with little success.  Pt rescheduled for today with Dr. Ladona Ridgel.

## 2018-04-29 NOTE — Telephone Encounter (Signed)
Patient would like nurse to give him a call

## 2018-04-29 NOTE — Telephone Encounter (Signed)
New message:   Patient calling because no one has called for his appt that was at 11:30 today

## 2018-04-30 ENCOUNTER — Telehealth: Payer: Self-pay | Admitting: Internal Medicine

## 2018-04-30 NOTE — Telephone Encounter (Signed)
Follow Up:   Pt missed appt yesterday, said they could not get it to work. He wants to know when can he see Dr Ladona Ridgel? I did not seen an appt for any time soon. Pt said he needs to have an appt

## 2018-05-08 ENCOUNTER — Encounter: Payer: Self-pay | Admitting: *Deleted

## 2018-05-08 ENCOUNTER — Telehealth: Payer: BLUE CROSS/BLUE SHIELD | Admitting: Internal Medicine

## 2018-05-08 ENCOUNTER — Other Ambulatory Visit: Payer: Self-pay | Admitting: *Deleted

## 2018-05-08 DIAGNOSIS — I351 Nonrheumatic aortic (valve) insufficiency: Secondary | ICD-10-CM

## 2018-05-13 ENCOUNTER — Other Ambulatory Visit: Payer: Self-pay

## 2018-05-13 ENCOUNTER — Encounter: Payer: Self-pay | Admitting: Internal Medicine

## 2018-05-13 ENCOUNTER — Ambulatory Visit: Payer: BLUE CROSS/BLUE SHIELD | Admitting: Internal Medicine

## 2018-05-13 VITALS — BP 130/74 | HR 59 | Ht 69.0 in | Wt 155.0 lb

## 2018-05-13 DIAGNOSIS — I472 Ventricular tachycardia: Secondary | ICD-10-CM | POA: Diagnosis not present

## 2018-05-13 DIAGNOSIS — I469 Cardiac arrest, cause unspecified: Secondary | ICD-10-CM

## 2018-05-13 DIAGNOSIS — I350 Nonrheumatic aortic (valve) stenosis: Secondary | ICD-10-CM | POA: Diagnosis not present

## 2018-05-13 DIAGNOSIS — J9601 Acute respiratory failure with hypoxia: Secondary | ICD-10-CM | POA: Diagnosis not present

## 2018-05-13 NOTE — Patient Instructions (Addendum)
Medication Instructions:  Your physician recommends that you continue on your current medications as directed. Please refer to the Current Medication list given to you today.  Labwork: None ordered.  Testing/Procedures: None ordered.  Follow-Up: Your physician wants you to follow-up in: after your procedure with Dr. Tyrone Sage to rediscuss possible ICD implant.  Any Other Special Instructions Will Be Listed Below (If Applicable).  If you need a refill on your cardiac medications before your next appointment, please call your pharmacy.

## 2018-05-13 NOTE — Progress Notes (Signed)
HPI Kevin Rojas returns today for ongoing evaluation and management of ventricular fibrillation, coronary artery disease, aortic insufficiency, and mild left ventricular dysfunction.  The patient sustained a resuscitated cardiac arrest approximately 6 weeks ago.  He was discharged home with a LifeVest.  He was noted to have surgical aortic valve disease secondary to aortic insufficiency and a bicuspid valve.  There was also evidence of a sending aortic aortopathy and plan was for aortic root replacement at the time of valve replacement.  He is pending surgical therapy in several weeks.  He has not had shortness of breath or chest pain but admits to being sedentary.  He is received no LifeVest therapies. No Known Allergies   Current Outpatient Medications  Medication Sig Dispense Refill  . acetaminophen (TYLENOL) 325 MG tablet Take 2 tablets (650 mg total) by mouth every 4 (four) hours as needed for headache or mild pain.    Marland Kitchen metoCLOPramide (REGLAN) 5 MG tablet Take 1 tablet (5 mg total) by mouth 4 (four) times daily -  before meals and at bedtime. 120 tablet 1  . pantoprazole (PROTONIX) 40 MG tablet Take 1 tablet (40 mg total) by mouth daily. 30 tablet 0  . Rivaroxaban (XARELTO) 20 MG TABS tablet Take 1 tablet (20 mg total) by mouth daily with supper. 30 tablet 3  . Rivaroxaban 15 & 20 MG TBPK Start with one 15mg  tablet by mouth twice a day with food. On Day 22, switch to one 20mg  tablet once a day with food. 51 each 0  . senna-docusate (SENOKOT-S) 8.6-50 MG tablet Take 1 tablet by mouth at bedtime as needed for mild constipation. 30 tablet 1   No current facility-administered medications for this visit.      No past medical history on file.  ROS:   All systems reviewed and negative except as noted in the HPI.   Past Surgical History:  Procedure Laterality Date  . AORTIC ARCH ANGIOGRAPHY N/A 04/02/2018   Procedure: AORTIC ARCH ANGIOGRAPHY;  Surgeon: Lennette Bihari, MD;  Location:  [facility] INVASIVE CV LAB;  Service: Cardiovascular;  Laterality: N/A;  . RIGHT/LEFT HEART CATH AND CORONARY ANGIOGRAPHY N/A 04/02/2018   Procedure: RIGHT/LEFT HEART CATH AND CORONARY ANGIOGRAPHY;  Surgeon: Lennette Bihari, MD;  Location: [facility] INVASIVE CV LAB;  Service: Cardiovascular;  Laterality: N/A;     Family History  Problem Relation Age of Onset  . Pulmonary embolism Mother   . Prostate cancer Father        passed awary at 43     Social History   Socioeconomic History  . Marital status: Married    Spouse name: Not on file  . Number of children: Not on file  . Years of education: Not on file  . Highest education level: Not on file  Occupational History  . Not on file  Social Needs  . Financial resource strain: Not on file  . Food insecurity:    Worry: Not on file    Inability: Not on file  . Transportation needs:    Medical: Not on file    Non-medical: Not on file  Tobacco Use  . Smoking status: Never Smoker  . Smokeless tobacco: Never Used  Substance and Sexual Activity  . Alcohol use: Never    Frequency: Never  . Drug use: Never  . Sexual activity: Not on file  Lifestyle  . Physical activity:    Days per week: Not on file    Minutes per  session: Not on file  . Stress: Not on file  Relationships  . Social connections:    Talks on phone: Not on file    Gets together: Not on file    Attends religious service: Not on file    Active member of club or organization: Not on file    Attends meetings of clubs or organizations: Not on file    Relationship status: Not on file  . Intimate partner violence:    Fear of current or ex partner: Not on file    Emotionally abused: Not on file    Physically abused: Not on file    Forced sexual activity: Not on file  Other Topics Concern  . Not on file  Social History Narrative  . Not on file     BP 130/74   Pulse (!) 59   Ht 5\' 9"  (1.753 m)   Wt 155 lb (70.3 kg)   BMI 22.89 kg/m   Physical Exam:  Well appearing NAD  HEENT: Unremarkable Neck:  No JVD, no thyromegally Lymphatics:  No adenopathy Back:  No CVA tenderness Lungs:  Clear with no wheezes, rales, or rhonchi HEART:  Regular rate rhythm, 2 out of 6 diastolic murmur, no rubs, no clicks Abd:  soft, positive bowel sounds, no organomegally, no rebound, no guarding Ext:  2 plus pulses, no edema, no cyanosis, no clubbing Skin:  No rashes no nodules Neuro:  CN II through XII intact, motor grossly intact  EKG -sinus bradycardia with left bundle branch block  Assess/Plan: 1.  Ventricular fibrillation cardiac arrest -the patient has mild left ventricular dysfunction and aortic insufficiency.  Unfortunately, there is very little bit of to say that aortic valve replacement would resolve his risk for recurrent ventricular fibrillation and sudden cardiac death.  For this reason I would anticipate ICD insertion following his aortic valve replacement.  The timing of this will be in part depending on how the patient does with his valve replacement surgery. 2.  Left bundle branch block -the patient has left bundle branch block and left ventricular dysfunction.  We will plan to repeat his 2D echo after valve replacement surgery.  We will decide on whether to consider single or dual-chamber biventricular ICD insertion based on all of the above. 3. Aortic insufficiency -he has tolerated this reasonably well.  His AI is severe.  Surgery is planned for 3 weeks.  Lewayne Bunting, MD

## 2018-05-18 ENCOUNTER — Other Ambulatory Visit: Payer: Self-pay | Admitting: Cardiothoracic Surgery

## 2018-05-18 DIAGNOSIS — Z736 Limitation of activities due to disability: Secondary | ICD-10-CM

## 2018-05-18 LAB — BLOOD GAS, ARTERIAL

## 2018-05-26 ENCOUNTER — Ambulatory Visit: Payer: BLUE CROSS/BLUE SHIELD | Admitting: Cardiothoracic Surgery

## 2018-05-26 ENCOUNTER — Other Ambulatory Visit: Payer: Self-pay

## 2018-05-27 ENCOUNTER — Ambulatory Visit: Payer: BLUE CROSS/BLUE SHIELD | Admitting: Cardiothoracic Surgery

## 2018-05-27 ENCOUNTER — Other Ambulatory Visit: Payer: Self-pay | Admitting: *Deleted

## 2018-05-27 ENCOUNTER — Encounter: Payer: Self-pay | Admitting: Cardiothoracic Surgery

## 2018-05-27 VITALS — BP 140/60 | HR 52 | Temp 98.5°F | Resp 16 | Ht 69.0 in | Wt 155.0 lb

## 2018-05-27 DIAGNOSIS — I351 Nonrheumatic aortic (valve) insufficiency: Secondary | ICD-10-CM

## 2018-05-27 DIAGNOSIS — Q231 Congenital insufficiency of aortic valve: Secondary | ICD-10-CM

## 2018-05-27 NOTE — Patient Instructions (Signed)
. Thoracic Aortic Aneurysm  An aneurysm is a bulge in an artery. It happens when blood pushes up against a weakened or damaged artery wall. A thoracic aortic aneurysm is an aneurysm that occurs in the first part of the aorta, between the heart and the diaphragm. The aorta is the main artery of the body. It supplies blood from the heart to the rest of the body. Some aneurysms may not cause symptoms or problems. However, the major concern with a thoracic aortic aneurysm is that it can enlarge and burst (rupture), or blood can flow between the layers of the wall of the aorta through a tear (aorticdissection). Both of these conditions can cause bleeding inside the body and can be life threatening if they are not diagnosed and treated right away. What are the causes? The exact cause of this condition is not known. What increases the risk? The following factors may make you more likely to develop this condition:  Being 18 or older.  Having hardening of the arteries caused by the buildup of fat and other substances in the lining of a blood vessel (arteriosclerosis).  Having inflammation of the walls of an artery (arteritis).  Having a genetic disease that weakens the body's connective tissue, such as Marfan syndrome.  Having a family history of aneurysms.  Having an injury or trauma to the aorta.  Using tobacco.  Having high blood pressure (hypertension).  Having high cholesterol.  Having an infection that is caused by bacteria, such as syphilis or staphylococcus, in the wall of the aorta (infectious aortitis). What are the signs or symptoms? Symptoms of this condition vary depending on the size of the aneurysm and how fast it is growing. Most grow slowly and do not cause symptoms. When symptoms do occur, they may include:  Pain in the chest, back, sides, or abdomen. The pain may vary in intensity. Sudden, severe pain may indicate that the aneurysm has ruptured.  Hoarseness.  Cough.   Shortness of breath.  Swallowing problems.  Swelling in the face, arms, or legs.  Fever.  Unexplained weight loss. How is this diagnosed? This condition may be diagnosed with:  An ultrasound.  X-rays.  A CT scan.  An MRI.  Tests to check the arteries for damage or blockages (angiogram). Most unruptured thoracic aortic aneurysms cause no symptoms, so they are often found during exams for other conditions. How is this treated? Treatment for this condition depends on:  The size of the aneurysm.  How fast the aneurysm is growing.  Your age.  Risk factors for rupture. Small aneurysms (2.2 inches, or 5.5 cm, or less) may be managed with:  Medicines to: ? Control blood pressure. ? Manage pain. ? Fight infection.  Regular monitoring. This may include an ultrasound or CT scan every year or every 6 months to see if the aneurysm is getting bigger. Large or fast-growing aneurysms may be treated with surgery. Follow these instructions at home: Eating and drinking  Eat a healthy diet. Your health care provider may recommend that you: ? Lower your salt (sodium) intake. In some people, too much salt can raise blood pressure and increase the risk for thoracic aortic aneurysm. ? Avoid foods that are high in saturated fat and cholesterol, such as red meat and dairy. ? Eat a diet that is low in sugar. ? Increase your fiber intake by including whole grains, vegetables, and fruits in your diet. Eating these foods may help to lower your blood pressure.  Limit or avoid  alcohol as recommended by your health care provider. Lifestyle  Do not use any products that contain nicotine or tobacco, such as cigarettes and e-cigarettes. If you need help quitting, ask your health care provider.  Maintain a healthy weight.  Check your blood pressure regularly. Follow your health care provider's instructions on how to keep your blood pressure within normal limits.  Have your blood sugar (glucose)  level and cholesterol levels checked regularly. Follow your health care provider's instructions on how to keep levels within normal limits. Activity   Stay physically active and exercise regularly. Talk with your health care provider about how often you should exercise and ask which types of exercise are best for you.  Avoid heavy lifting and activities that take a lot of effort. Ask your health care provider what activities are safe for you. General instructions  Take over-the-counter and prescription medicines only as told by your health care provider.  Talk with your health care provider about regular screenings to see if the aneurysm is getting bigger.  Keep all follow-up visits as told by your health care provider. This is important. Contact a health care provider if you have:  Unexplained weight loss. Get help right away if you have:  Pain in your upper back, neck, or abdomen. This pain may move into your chest and arms.  Trouble swallowing.  A cough or hoarseness.  Shortness of breath. Summary  A thoracic aortic aneurysm is an aneurysm that occurs in the first part of the aorta, between the heart and the diaphragm.  As a thoracic aortic aneurysm becomes larger, it can burst (rupture), or blood can flow between the layers of the wall of the aorta through a tear (aorticdissection). These conditions can be life threatening if they are not diagnosed and treated right away.  If you have a thoracic aortic aneurysm, its growth will be closely monitored. Surgical repair may be needed for larger or faster-growing aneurysms. This information is not intended to replace advice given to you by your health care provider. Make sure you discuss any questions you have with your health care provider. Document Released: 12/24/2004 Document Revised: 02/04/2017 Document Reviewed: 02/04/2017 Elsevier Interactive Patient Education  2019 [employer]   Aortic Valve Replacement Aortic valve  replacement is surgical replacement of an aortic valve that cannot be repaired. This procedure may be done to replace:  A narrow aortic valve (aortic valve stenosis).  A deformed aortic valve (bicuspid aortic valve).  An aortic valve that does not close all the way (aortic insufficiency). The aortic valve is replaced with artificial (prosthetic) valve. Three types of prosthetic valves are available:  Mechanical valves made entirely from man-made materials.  Donor valves from human donors. These are only used in special situations.  Biological valves made from animal tissues. The type of prosthetic valve used will be determined based on various factors, including your age, your lifestyle, and other medical conditions you have. This procedure is done using an open surgical technique, meaning that a large incision will be made in your chest over your heart. Tell a health care provider about:  Any allergies you have.  All medicines you are taking, including vitamins, herbs, eye drops, creams, and over-the-counter medicines.  Any problems you or family members have had with anesthetic medicines.  Any blood disorders you have.  Any surgeries you have had.  Any medical conditions you have.  Whether you are pregnant or may be pregnant. What are the risks? Generally, this is a safe  procedure. However, problems may occur, including:  Infection.  Bleeding.  Allergic reactions to medicines.  Damage to other structures or organs.  Blood clotting caused by the prosthetic valve.  Failure of the prosthetic valve. What happens before the procedure?  Ask your health care provider about: ? Changing or stopping your regular medicines. This is especially important if you are taking diabetes medicines or blood thinners. ? Taking medicines such as aspirin and ibuprofen. These medicines can thin your blood. Do not take these medicines before your procedure if your health care provider  instructs you not to.  Follow instructions from your health care provider about eating or drinking restrictions.  You may be given antibiotic medicine to help prevent infection.  You may have tests, such as: ? Echocardiogram. ? Electrocardiogram (ECG). ? Blood tests. ? Urine tests.  Plan to have someone take you home when you leave the hospital.  Plan to have someone with you for at least 24 hours after you leave the hospital. What happens during the procedure?  To reduce your risk of infection: ? Your health care team will wash or sanitize their hands. ? Your skin will be washed with soap.  An IV tube will be inserted into one of your veins.  You will be given one or more of the following: ? A medicine to help you relax (sedative). ? A medicine to make you fall asleep (general anesthetic).  You will be placed on a heart-lung bypass machine. This machine provides oxygen to your blood while your heart is undergoing surgery.  An incision will be made in your chest, over your heart.  Your damaged aortic valve will be removed.  A prosthetic valve will be sewn into your heart.  Your incision will be closed with stitches (sutures), skin glue, or adhesive tape.  A bandage (dressing) will be placed over your incision. The procedure may vary among health care providers and hospitals. What happens after the procedure?  Your blood pressure, heart rate, breathing rate, and blood oxygen level will be monitored often until the medicines you were given have worn off.  Do not drive until your health care provider approves. This information is not intended to replace advice given to you by your health care provider. Make sure you discuss any questions you have with your health care provider. Document Released: 05/15/2004 Document Revised: 04/25/2016 Document Reviewed: 11/27/2014 Elsevier Interactive Patient Education  2019 [employer]  Aortic Valve Replacement, Care After Refer to  this sheet in the next few weeks. These instructions provide you with information about caring for yourself after your procedure. Your health care provider may also give you more specific instructions. Your treatment has been planned according to current medical practices, but problems sometimes occur. Call your health care provider if you have any problems or questions after your procedure. What can I expect after the procedure? After the procedure, it is common to have:  Pain around your incision area.  A small amount of blood or clear fluid coming from your incision. Follow these instructions at home: Eating and drinking      Follow instructions from your health care provider about eating or drinking restrictions. ? Limit alcohol intake to no more than 1 drink per day for nonpregnant women and 2 drinks per day for men. One drink equals 12 oz of beer, 5 oz of wine, or 1 oz of hard liquor. ? Limit how much caffeine you drink. Caffeine can affect your heart's rate and rhythm.  Drink enough fluid to keep your urine clear or pale yellow.  Eat a heart-healthy diet. This should include plenty of fresh fruits and vegetables. If you eat meat, it should be lean cuts. Avoid foods that are: ? High in salt, saturated fat, or sugar. ? Canned or highly processed. ? Fried. Activity  Return to your normal activities as told by your health care provider. Ask your health care provider what activities are safe for you.  Exercise regularly once you have recovered, as told by your health care provider.  Avoid sitting for more than 2 hours at a time without moving. Get up and move around at least once every 1-2 hours. This helps to prevent blood clots in the legs.  Do not lift anything that is heavier than 10 lb (4.5 kg) until your health care provider approves.  Avoid pushing or pulling things with your arms until your health care provider approves. This includes pulling on handrails to help you climb  stairs. Incision care   Follow instructions from your health care provider about how to take care of your incision. Make sure you: ? Wash your hands with soap and water before you change your bandage (dressing). If soap and water are not available, use hand sanitizer. ? Change your dressing as told by your health care provider. ? Leave stitches (sutures), skin glue, or adhesive strips in place. These skin closures may need to stay in place for 2 weeks or longer. If adhesive strip edges start to loosen and curl up, you may trim the loose edges. Do not remove adhesive strips completely unless your health care provider tells you to do that.  Check your incision area every day for signs of infection. Check for: ? More redness, swelling, or pain. ? More fluid or blood. ? Warmth. ? Pus or a bad smell. Medicines  Take over-the-counter and prescription medicines only as told by your health care provider.  If you were prescribed an antibiotic medicine, take it as told by your health care provider. Do not stop taking the antibiotic even if you start to feel better. Travel  Avoid airplane travel for as long as told by your health care provider.  When you travel, bring a list of your medicines and a record of your medical history with you. Carry your medicines with you. Driving  Ask your health care provider when it is safe for you to drive. Do not drive until your health care provider approves.  Do not drive or operate heavy machinery while taking prescription pain medicine. Lifestyle  Do not use any tobacco products, such as cigarettes, chewing tobacco, or e-cigarettes. If you need help quitting, ask your health care provider.  Resume sexual activity as told by your health care provider. Do not use medicines for erectile dysfunction unless your health care provider approves, if this applies.  Work with your health care provider to keep your blood pressure and cholesterol under control, and  to manage any other heart conditions that you have.  Maintain a healthy weight. General instructions  Do not take baths, swim, or use a hot tub until your health care provider approves.  Do not strain to have a bowel movement.  Avoid crossing your legs while sitting down.  Check your temperature every day for a fever. A fever may be a sign of infection.  If you are a woman and you plan to become pregnant, talk with your health care provider before you become pregnant.  Wear compression stockings  if your health care provider instructs you to do this. These stockings help to prevent blood clots and reduce swelling in your legs.  Tell all health care providers who care for you that you have an artificial (prosthetic) aortic valve. If you have or have had heart disease or endocarditis, tell all health care providers about these conditions as well.  Keep all follow-up visits as told by your health care provider. This is important. Contact a health care provider if:  You develop a skin rash.  You experience sudden, unexplained changes in your weight.  You have more redness, swelling, or pain around your incision.  You have more fluid or blood coming from your incision.  Your incision feels warm to the touch.  You have pus or a bad smell coming from your incision.  You have a fever. Get help right away if:  You develop chest pain that is different from the pain coming from your incision.  You develop shortness of breath or difficulty breathing.  You start to feel light-headed. These symptoms may represent a serious problem that is an emergency. Do not wait to see if the symptoms will go away. Get medical help right away. Call your local emergency services (911 in the [place] Do not drive yourself to the hospital. This information is not intended to replace advice given to you by your health care provider. Make sure you discuss any questions you have with your health care provider.  Document Released: 07/12/2004 Document Revised: 04/25/2016 Document Reviewed: 11/27/2014 Elsevier Interactive Patient Education  2019 [employer].

## 2018-05-27 NOTE — Progress Notes (Signed)
[address]       [place] [address]             [phone]                    Kevin Rojas [facility] Medical Record #[id number] Date of Birth: 1955/12/01  Referring: [name], MD Primary Care: [name], MD Primary Cardiologist: Tonny Bollman, MD  Chief Complaint:    Chief Complaint  Patient presents with  . Follow-up    to discuss AVR    History of Present Illness:    Mukund Rojas is a 63 y.o. male with history of recent out of hospital ventricular fibrillation cardiac arrest March 24, 2018 followed by prolonged hospitalization.  The patient had a witnessed cardiac arrest and his wife performed bystander CPR until EMS arrived, promptly defibrillating the patient and resuscitating him.  He underwent therapeutic hypothermia per protocol.  During his hospitalization, he was noted to have mild LV systolic dysfunction with dilatation of the left ventricle and severe aortic valve insufficiency.  He is found to have a bicuspid aortic valve and dilated ascending aorta 4.5 cm.   Cardiac catheterization demonstrated widely patent coronary arteries and normal hemodynamics.  The patient slowly improved with respect to his anoxic encephalopathy and ultimately was discharged home on April 13, 2018 after inpatient rehab.   At the time of the patient's admission initially he had a CT scan without evidence of dissection, subsequent follow-up CT with contrast showed evidence of recent pulmonary emboli, as a result the patient was discharged home on Xarelto.  Since discharge she is limited his overall physical activity, has had no signs symptoms of covert infection, and has had no overt symptoms of congestive heart failure.  He has seen Dr. Ladona Ridgel in regards to postop placement of pacemaker and/or defibrillator, he does have a left bundle branch block.  Current Activity/ Functional Status:  Patient is independent with mobility/ambulation, transfers, ADL's,  IADL's.   Zubrod Score: At the time of surgery this patient's most appropriate activity status/level should be described as:     0    Normal activity, no symptoms     1    Restricted in physical strenuous activity but ambulatory, able to do out light work     2    Ambulatory and capable of self care, unable to do work activities, up and about               >50 % of waking hours                                  3    Only limited self care, in bed greater than 50% of waking hours     4    Completely disabled, no self care, confined to bed or chair     5    Moribund   No past medical history on file.  Past Surgical History:  Procedure Laterality Date  . AORTIC ARCH ANGIOGRAPHY N/A 04/02/2018   Procedure: AORTIC ARCH ANGIOGRAPHY;  Surgeon: Lennette Bihari, MD;  Location: [facility] INVASIVE CV LAB;  Service: Cardiovascular;  Laterality: N/A;  . RIGHT/LEFT HEART CATH AND CORONARY ANGIOGRAPHY N/A 04/02/2018   Procedure: RIGHT/LEFT HEART CATH AND CORONARY ANGIOGRAPHY;  Surgeon: Lennette Bihari, MD;  Location: [facility] INVASIVE CV LAB;  Service: Cardiovascular;  Laterality: N/A;    Family  History  Problem Relation Age of Onset  . Pulmonary embolism Mother   . Prostate cancer Father        passed awary at 21     Social History   Tobacco Use  Smoking Status Never Smoker  Smokeless Tobacco Never Used    Social History   Substance and Sexual Activity  Alcohol Use Never  . Frequency: Never     No Known Allergies  Current Outpatient Medications  Medication Sig Dispense Refill  . acetaminophen (TYLENOL) 325 MG tablet Take 2 tablets (650 mg total) by mouth every 4 (four) hours as needed for headache or mild pain.    . pantoprazole (PROTONIX) 40 MG tablet Take 1 tablet (40 mg total) by mouth daily. 30 tablet 0  . Rivaroxaban (XARELTO) 20 MG TABS tablet Take 1 tablet (20 mg total) by mouth daily with supper. 30 tablet 3  . senna-docusate (SENOKOT-S) 8.6-50 MG tablet Take 1 tablet by  mouth at bedtime as needed for mild constipation. 30 tablet 1  . metoCLOPramide (REGLAN) 5 MG tablet Take 1 tablet (5 mg total) by mouth 4 (four) times daily -  before meals and at bedtime. (Patient not taking: Reported on 05/27/2018) 120 tablet 1   No current facility-administered medications for this visit.     Pertinent items are noted in HPI.   Review of Systems:     Cardiac Review of Systems: [Y] = yes  or   [ N ] = no   Chest Pain [ n   ]  Resting SOB [  n ] Exertional SOB  Kevin.Rojas  ]  Orthopnea [ n ]   Pedal Edema [ n  ]    Palpitations [n  ] Syncope  [cardaic arrest  ]   Presyncope [n   ]   General Review of Systems: [Y] = yes [  ]=no Constitional: recent weight change [  ];  Wt loss over the last 3 months [   ] anorexia [  ]; fatigue [  ]; nausea [  ]; night sweats [  ]; fever [  ]; or chills [  ];           Eye : blurred vision [  ]; diplopia [   ]; vision changes [  ];  Amaurosis fugax[  ]; Resp: cough [  ];  wheezing[  ];  hemoptysis[  ]; shortness of breath[  ]; paroxysmal nocturnal dyspnea[  ]; dyspnea on exertion[  ]; or orthopnea[  ];  GI:  gallstones[  ], vomiting[  ];  dysphagia[  ]; melena[  ];  hematochezia [  ]; heartburn[  ];   Hx of  Colonoscopy[  ]; GU: kidney stones [  ]; hematuria[  ];   dysuria [  ];  nocturia[  ];  history of     obstruction [  ]; urinary frequency [  ]             Skin: rash, swelling[  ];, hair loss[  ];  peripheral edema[  ];  or itching[  ]; Musculosketetal: myalgias[  ];  joint swelling[  ];  joint erythema[  ];  joint pain[  ];  back pain[  ];  Heme/Lymph: bruising[  ];  bleeding[  ];  anemia[  ];  Neuro: TIA[  ];  headaches[  ];  stroke[  ];  vertigo[  ];  seizures[  ];   paresthesias[  ];  difficulty walking[  ];  Psych:depression[  ];  anxiety[  ];  Endocrine: diabetes[  ];  thyroid dysfunction[  ];  Immunizations: Flu up to date Kevin.Rojas  ]; Pneumococcal up to date Kevin.Rojas  ];  Other:     PHYSICAL EXAMINATION: BP 140/60 (BP Location: Left Arm,  Patient Position: Sitting, Cuff Size: Large)   Pulse (!) 52   Temp 98.5 F (36.9 C) (Oral)   Resp 16   Ht  (1.753 m)   Wt 155 lb (70.3 kg)   SpO2 96% Comment: RA  BMI 22.89 kg/m  General appearance: alert, cooperative and no distress Head: Normocephalic, without obvious abnormality, atraumatic Neck: no adenopathy, no carotid bruit, no JVD, supple, symmetrical, trachea midline and thyroid not enlarged, symmetric, no tenderness/mass/nodules Lymph nodes: Cervical, supraclavicular, and axillary nodes normal. Resp: clear to auscultation bilaterally Cardio: diastolic murmur: mid diastolic 3/6, crescendo at 2nd left intercostal space, at apex GI: soft, non-tender; bowel sounds normal; no masses,  no organomegaly Extremities: extremities normal, atraumatic, no cyanosis or edema and Homans sign is negative, no sign of DVT Neurologic: Grossly normal  Diagnostic Studies & Laboratory data:     Recent Radiology Findings:  CLINICAL DATA:  Cardiac arrest, severe aortic valvular insufficiency and aneurysmal dilatation of the ascending thoracic aorta by prior CTA of the chest timed for pulmonary artery evaluation.  EXAM: CT ANGIOGRAPHY CHEST, ABDOMEN AND PELVIS  TECHNIQUE: Multidetector CT imaging through the chest, abdomen and pelvis was performed using the standard protocol during bolus administration of intravenous contrast. Multiplanar reconstructed images and MIPs were obtained and reviewed to evaluate the vascular anatomy.  CONTRAST:  OMNIPAQUE IOHEXOL 350 MG/ML SOLN  COMPARISON:  CTA of the chest on 03/24/2018  FINDINGS: CTA CHEST FINDINGS  Cardiovascular: The aortic root is mildly dilated and measures approximately 4.1-4.2 cm at the level of the sinuses of Valsalva. The aortic valve shows mild central calcification. The ascending thoracic aorta is mildly dilated and measures 4.2-4.3 cm in greatest diameter. The proximal arch measures approximately 3.3 cm. The  distal arch demonstrates severe tortuosity and focal kinking prior to an area of mild dilatation measuring 3.2 cm. The descending thoracic aorta then tapers to a diameter of 2.8 cm. There is no evidence of aortic dissection. Proximal great vessels demonstrate normal branching anatomy and normal patency without aneurysmal disease.  The heart is mildly enlarged. The left ventricle appears dilated. Trace anterior pericardial fluid without significant pericardial effusion. No significant calcified coronary artery plaque is identified by CT.  Although not optimized for pulmonary arterial evaluation, the pulmonary arteries are quite well opacified on the study and there is a new finding of definitive nonocclusive pulmonary embolism in the right lung with nonocclusive thrombus identified at a bifurcation of the right lower lobe pulmonary artery into segmental branches and within a right middle lobe pulmonary artery branch. No left-sided pulmonary embolism is identified. There is no evidence of central PE or saddle embolism.  Mediastinum/Nodes: No enlarged mediastinal, hilar, or axillary lymph nodes. Thyroid gland, trachea, and esophagus demonstrate no significant findings.  Lungs/Pleura: Mild scarring/atelectasis at both lung bases. There is no evidence of pulmonary edema, consolidation, pneumothorax, nodule or pleural fluid. No evidence of focal pulmonary infarction.  Musculoskeletal: No chest wall abnormality. No acute or significant osseous findings.  Review of the MIP images confirms the above findings.  CTA ABDOMEN AND PELVIS FINDINGS  VASCULAR  Aorta: The abdominal aorta is normally patent and of normal caliber without evidence of aneurysm or dissection. No significant atherosclerosis.  Celiac: Normally patent.  Normal  branch vessel anatomy.  SMA: Normally patent.  Renals: A single right renal artery and 2 separate left renal arteries with small accessory  upper pole artery demonstrate normal patency.  IMA: Normally patent.  Inflow: Bilateral iliac arteries demonstrate normal patency. Common femoral arteries and femoral bifurcations are widely patent.  Review of the MIP images confirms the above findings.  NON-VASCULAR  Hepatobiliary: No focal liver abnormality is seen. No gallstones, gallbladder wall thickening, or biliary dilatation.  Pancreas: Unremarkable. No pancreatic ductal dilatation or surrounding inflammatory changes.  Spleen: Normal in size without focal abnormality.  Adrenals/Urinary Tract: Adrenal glands are unremarkable. Kidneys are normal, without renal calculi, focal lesion, or hydronephrosis. Bladder is unremarkable.  Stomach/Bowel: Bowel shows no evidence of obstruction, ileus or inflammation. No free air identified.  Lymphatic: No enlarged lymph nodes identified.  Reproductive: Prostate is unremarkable.  Other: No hernias identified.  No free fluid or focal abscess.  Musculoskeletal: Mild degenerative disc disease at L4-5 and L5-S1.  Review of the MIP images confirms the above findings.  IMPRESSION: 1. New, acute subsegmental nonocclusive pulmonary embolism in branches of the right lower lobe and right middle lobe pulmonary arteries. Overall volume of thrombus burden is low. This thrombus was not present on the CTA dated 03/24/2018. 2. Mild aneurysmal disease involving the aortic root and ascending thoracic aorta with the root measuring 4.2 cm in greatest diameter and the ascending thoracic aorta measuring 4.3 cm in greatest diameter. Associated partially calcified aortic valve. The aortic arch is very tortuous distally and demonstrates some degree of focal kinking due to tortuosity without evidence of significant stenosis. No evidence of aortic dissection. 3. Left ventricular cavity dilatation. 4. No evidence of aneurysmal disease or significant obstructive disease involving the  abdominal aorta, iliac arteries, common femoral arteries or visceral branches of the abdominal aorta.  These results were called by telephone at the time of interpretation on 04/04/2018 at 12:58 pm to Dr. Noralee Stain, who verbally acknowledged these results.   Electronically Signed   By: Irish Lack M.D.   On: 04/04/2018 13:10  CLINICAL DATA:  Cardiac arrest.  Intubated patient.  EXAM: CT ANGIOGRAPHY CHEST WITH CONTRAST  TECHNIQUE: Multidetector CT imaging of the chest was performed using the standard protocol during bolus administration of intravenous contrast. Multiplanar CT image reconstructions and MIPs were obtained to evaluate the vascular anatomy.  CONTRAST:  32mL OMNIPAQUE IOHEXOL 350 MG/ML SOLN  COMPARISON:  Current chest radiographs.  FINDINGS: Cardiovascular: There is satisfactory opacification of the pulmonary arteries to the segmental level. There is no evidence of a pulmonary embolism.  Heart is mildly enlarged. No pericardial effusion. No coronary artery calcifications. Ascending aorta is dilated to 4.4 cm. Aorta is not opacified. No atherosclerotic calcifications.  Mediastinum/Nodes: No neck base, no neck base or axillary masses or enlarged lymph nodes. Endotracheal tube and nasal/orogastric tube are well positioned. Subcentimeter shotty mediastinal lymph nodes. No discrete enlarged mediastinal or hilar lymph nodes. No masses. Trachea is unremarkable.  Lungs/Pleura: Bilateral dependent lung consolidation. Airspace consolidation is noted in the posterior right upper lobe with a lesser degree of posterior consolidation in the right lower lobe. There is less extensive consolidation in the posterior left upper lobe and left lower lobe.  No evidence of pulmonary edema. No pleural effusion and no pneumothorax.  Upper Abdomen: No acute abnormality.  Musculoskeletal: No chest wall abnormality. No acute or significant osseous findings.   Review of the MIP images confirms the above findings.  IMPRESSION: 1. No evidence of a pulmonary  embolus. 2. Dependent consolidation in both lungs, right greater than left. A significant portion of this is likely due to multifocal pneumonia. Some of this dependent opacity, particularly in the lower lobes, is likely atelectasis. 3. There is no evidence of pulmonary edema.  No pleural effusion. 4. Endotracheal and nasogastric tubes are well position. 5. Mild cardiomegaly. 6. Dilated ascending aorta to 4.2 cm. Recommend annual imaging followup by CTA or MRA. This recommendation follows 2010 ACCF/AHA/AATS/ACR/ASA/SCA/SCAI/SIR/STS/SVM Guidelines for the Diagnosis and Management of Patients with Thoracic Aortic Disease. Circulation. 2010; 121: M270-B867. Aortic aneurysm NOS (ICD10-I71.9)  Aortic aneurysm NOS (ICD10-I71.9).   Electronically Signed   By: Amie Portland M.D.   On: 03/24/2018 22:44    I have independently reviewed the above radiology studies  and reviewed the findings with the patient.   Recent Lab Findings: Lab Results  Component Value Date   WBC 5.0 04/13/2018   HGB 12.4 (L) 04/13/2018   HCT 39.3 04/13/2018   PLT 464 (H) 04/13/2018   GLUCOSE 100 (H) 04/13/2018   ALT 40 04/13/2018   AST 26 04/13/2018   NA 136 04/13/2018   K 4.6 04/13/2018   CL 104 04/13/2018   CREATININE 1.29 (H) 04/13/2018   BUN 12 04/13/2018   CO2 26 04/13/2018   TSH 1.863 03/25/2018   INR 1.2 04/13/2018   HGBA1C 5.3 03/24/2018    CATH: There is mild left ventricular systolic dysfunction.  LV end diastolic pressure is normal.  The left ventricular ejection fraction is 45-50% by visual estimate.  There is severe (4+) aortic regurgitation.   Normal right heart pressures.  Mild global LV dysfunction with an EF 45 to 50%.  Bicuspid aortic valve with severe 4+ aortic insufficiency, mild aortic stenosis.  Dilated aortic root.  Normal coronary arteries and a left  dominant circulation.  RECOMMENDATION: The patient will ultimately need surgical consultation for aortic valve repair/replacement. Right Heart Pressures RA: A-wave 7, V wave 4; mean 4 RV: 27/6 PA: 23/9; mean 15 PW: A-wave 11 V wave 10; mean 9  Initial AO 124/71. Initial LV: 144/9  Pullback: LV: 148/8 Ao: 133/70  Oxygen saturation in the pulmonary artery 74% in the aorta 96%.  By the [facility] method cardiac output 6.5 L/min and cardiac index 3.6 L/min/m.  Mild aortic stenosis with a probable congenital bicuspid valve with a peak to peak gradient of 15 mm Hg, mean gradient of 14 mm Hg and calculated valve area 2.3 cm.  Severe 4+ aortic insufficiency.  EF estimate 45 to 50% as assessed by supravalvular aortography/AR  with LVEDP at 9 mm.    The [employer] of [employer]iology [facility]) and the [place] Heart Association [facility]) have issued a statement to clarify 2 previous guidelines from the [facility], AHA, and collaborating societies addressing the risk of aortic dissection in patients with bicuspid aortic valves (BAV) and severe aortic enlargement. The 2 guidelines differ with regard to the recommended threshold of aortic root or ascending aortic dilatation that would justify surgical intervention in patients with bicuspid aortic valves. This new statement of clarification uses the ACC/AHA revised structure for delineating the Class of Recommendation and Level of Evidence to provide recommendation that replace those contained in Section 9.2.2.1 of the thoracic aortic disease guidelines and Section 5.1.3 of the valvular heart disease guideline. New recommendations in intervention in patients with BAV and dilatation of the aortic root (sinuses) or ascending aorta include:  . Operative intervention to repair or replace the aortic root (sinuses) or replace the ascending aorta  is indicated in asymptomatic patients with BAV if the diameter of the aortic root or ascending aorta is 5.5 cm or greater. (Class of  recommendation 1, Level of evidence B-NR).  Marland Kitchen Operative intervention to repair or replace the aortic root (sinuses) or replace the ascending aorta is reasonable in asymptomatic patients with BAV if the diameter of the aortic root or ascending aorta is 5.0 cm or greater and an additional risk factor for dissection is present or if the patient is at low surgical risk and the surgery is performed by an experienced aortic surgical team in a center with established expertise in these procedures. (Class of recommendation IIa; Level of Evidence B-NR).  . Replacement of the ascending aorta is reasonable in patients with BAV undergoing AVR because of severe aortic stenosis or aortic regurgitation when the diameter of the ascending aorta is greater than 4.5 cm (Class of recommendation IIa; Level of evidence C-EO).  Citation: Gae Bon, Kristen Loader, et al. Surgery for aortic dilatation in patients with bicuspid aortic valves. A statement of clarification from the [employer]/American Heart Association Task Force on Clinical Practice Guidelines. [Published online ahead of print December 10, 2013]. Circulation. doi: 10.1161/CIR.0000000000000331.    Assessment / Plan: Sever AI with bicuspid aortic valve    Ventricular fibrillation cardiac arrest  Left bundle branch block -the patient has left bundle branch block and left ventricular dysfunction   History of PE on last admission- on Xarelto currently  With the patient's presentation and degree of aortic insufficiency which is severe is been recommended to the patient that we proceed with aortic valve replacement.  At the time will consider a ascending aortic replacement possible root replacement, as he does have a bicuspid aortic valve.  Patient's wife has a mechanical valve since she was in her teens he prefers not to have a mechanical valve because of the long-term issues with Coumadin.  I discussed with him in detail and with his  wife on the phone the risks and options of plan surgery including possible need for permanent pacemaker and/or AICD as outlined by Dr. Ladona Ridgel risk of bleeding blood transfusion stroke myocardial infarction, death,  pulmonary emboli have all been discussed in detail.   We will plan to proceed on June 1.  The patient will stop his Xarelto 3 days prior to surgery.   I  spent 25 minutes with  the patient face to face and greater then 50% of the time was spent in counseling and coordination of care.    Delight Ovens MD      [address] [place] [place] [address] Office [phone]   Beeper [phone]  05/27/2018 1:25 PM

## 2018-06-02 NOTE — Progress Notes (Signed)
[id number] [place], [place] - [address] AT [facility] [place] OF [address] [place] [address] Phone: [phone] Fax: [phone]  [facility] - [place], [place] - [address] [place] [address] Phone: [phone] Fax: [phone]      Your procedure is scheduled on Monday 06/08/2018.  Report to [facility] Main Entrance "A" at 05:30 A.M., and check in at the Admitting office.  Call this number if you have problems the morning of surgery:  [phone]  Call [phone] if you have any questions prior to your surgery date Monday-Friday 8am-4pm    Remember:  Do not eat or drink after midnight.    Take these medicines the morning of surgery with A SIP OF WATER: Acetaminophen (Tylenol) - if needed  7 days prior to surgery STOP taking any Aspirin (unless otherwise instructed by your surgeon), Aleve, Naproxen, Ibuprofen, Motrin, Advil, Goody's, BC's, all herbal medications, fish oil, and all vitamins.  Follow your surgeon's instructions on when to stop Rivaroxaban (Xarelto).  Per Dr. Tyrone Sage, STOP your Rivaroxaban (Xarelto), 3 days prior to your surgery.     The Morning of Surgery  Do not wear jewelry, make-up or nail polish.  Do not wear lotions, powders, or perfumes/colognes, or deodorant  Do not shave 48 hours prior to surgery.  Men may shave face and neck.  Do not bring valuables to the hospital.  [facility] is not responsible for any belongings or valuables.  If you are a smoker, DO NOT Smoke 24 hours prior to surgery IF you wear a CPAP at night please bring your mask, tubing, and machine the morning of surgery   Remember that you must have someone to transport you home after your surgery, and remain with you for 24 hours if you are discharged the same day.   Contacts, glasses, hearing aids, dentures or bridgework may not be worn into surgery.    Leave  your suitcase in the car.  After surgery it may be brought to your room.  For patients admitted to the hospital, discharge time will be determined by your treatment team.  Patients discharged the day of surgery will not be allowed to drive home.    Special instructions:   [facility]- Preparing For Surgery  Before surgery, you can play an important role. Because skin is not sterile, your skin needs to be as free of germs as possible. You can reduce the number of germs on your skin by washing with CHG (chlorahexidine gluconate) Soap before surgery.  CHG is an antiseptic cleaner which kills germs and bonds with the skin to continue killing germs even after washing.    Oral Hygiene is also important to reduce your risk of infection.  Remember - BRUSH YOUR TEETH THE MORNING OF SURGERY WITH YOUR REGULAR TOOTHPASTE  Please do not use if you have an allergy to CHG or antibacterial soaps. If your skin becomes reddened/irritated stop using the CHG.  Do not shave (including legs and underarms) for at least 48 hours prior to first CHG shower. It is OK to shave your face.  Please follow these instructions carefully.   1. Shower the NIGHT BEFORE SURGERY and the MORNING OF SURGERY with CHG Soap.   2. If you chose to wash your hair, wash your hair first as usual with your normal shampoo.  3. After you shampoo, rinse your hair and body thoroughly  to remove the shampoo.  4. Use CHG as you would any other liquid soap. You can apply CHG directly to the skin and wash gently with a scrungie or a clean washcloth.   5. Apply the CHG Soap to your body ONLY FROM THE NECK DOWN.  Do not use on open wounds or open sores. Avoid contact with your eyes, ears, mouth and genitals (private parts). Wash Face and genitals (private parts)  with your normal soap.   6. Wash thoroughly, paying special attention to the area where your surgery will be performed.  7. Thoroughly rinse your body with warm water from the neck  down.  8. DO NOT shower/wash with your normal soap after using and rinsing off the CHG Soap.  9. Pat yourself dry with a CLEAN TOWEL.  10. Wear CLEAN PAJAMAS to bed the night before surgery, wear comfortable clothes the morning of surgery  11. Place CLEAN SHEETS on your bed the night of your first shower and DO NOT SLEEP WITH PETS.    Day of Surgery: Shower as stated above. Do not apply any deodorants/lotions.  Please wear clean clothes to the hospital/surgery center.   Remember to brush your teeth WITH YOUR REGULAR TOOTHPASTE.   Please read over the following fact sheets that you were given.

## 2018-06-03 ENCOUNTER — Other Ambulatory Visit: Payer: Self-pay

## 2018-06-03 ENCOUNTER — Encounter (HOSPITAL_COMMUNITY)
Admission: RE | Admit: 2018-06-03 | Discharge: 2018-06-03 | Disposition: A | Discharge status: Home / Self Care | Payer: BLUE CROSS/BLUE SHIELD | Source: Ambulatory Visit | Attending: Cardiothoracic Surgery | Admitting: Cardiothoracic Surgery

## 2018-06-03 ENCOUNTER — Inpatient Hospital Stay (HOSPITAL_COMMUNITY): Admission: RE | Admit: 2018-06-03 | Payer: BLUE CROSS/BLUE SHIELD | Source: Ambulatory Visit

## 2018-06-03 ENCOUNTER — Encounter (HOSPITAL_COMMUNITY): Payer: Self-pay

## 2018-06-03 ENCOUNTER — Ambulatory Visit (HOSPITAL_COMMUNITY)
Admission: RE | Admit: 2018-06-03 | Discharge: 2018-06-03 | Disposition: A | Discharge status: Home / Self Care | Payer: BLUE CROSS/BLUE SHIELD | Source: Ambulatory Visit | Attending: Cardiothoracic Surgery | Admitting: Cardiothoracic Surgery

## 2018-06-03 DIAGNOSIS — I351 Nonrheumatic aortic (valve) insufficiency: Secondary | ICD-10-CM | POA: Diagnosis not present

## 2018-06-03 DIAGNOSIS — Z01810 Encounter for preprocedural cardiovascular examination: Secondary | ICD-10-CM | POA: Diagnosis not present

## 2018-06-03 HISTORY — DX: Personal history of urinary calculi: Z87.442

## 2018-06-03 HISTORY — DX: Cardiac arrhythmia, unspecified: I49.9

## 2018-06-03 HISTORY — DX: Other pulmonary embolism without acute cor pulmonale: I26.99

## 2018-06-03 HISTORY — DX: Nonrheumatic aortic (valve) insufficiency: I35.1

## 2018-06-03 HISTORY — DX: Pneumonia, unspecified organism: J18.9

## 2018-06-03 LAB — URINALYSIS, ROUTINE W REFLEX MICROSCOPIC
Bilirubin Urine: NEGATIVE
Glucose, UA: NEGATIVE mg/dL
Hgb urine dipstick: NEGATIVE
Ketones, ur: NEGATIVE mg/dL
Leukocytes,Ua: NEGATIVE
Nitrite: NEGATIVE
Protein, ur: NEGATIVE mg/dL
Specific Gravity, Urine: 1.019 (ref 1.005–1.030)
pH: 5 (ref 5.0–8.0)

## 2018-06-03 LAB — COMPREHENSIVE METABOLIC PANEL
ALT: 16 U/L (ref 0–44)
AST: 15 U/L (ref 15–41)
Albumin: 3.8 g/dL (ref 3.5–5.0)
Alkaline Phosphatase: 63 U/L (ref 38–126)
Anion gap: 8 (ref 5–15)
BUN: 9 mg/dL (ref 8–23)
CO2: 20 mmol/L — ABNORMAL LOW (ref 22–32)
Calcium: 9.4 mg/dL (ref 8.9–10.3)
Chloride: 110 mmol/L (ref 98–111)
Creatinine, Ser: 1.17 mg/dL (ref 0.61–1.24)
GFR calc Af Amer: >60 mL/min (ref >60)
GFR calc non Af Amer: >60 mL/min (ref >60)
Glucose, Bld: 80 mg/dL (ref 70–99)
Potassium: 4.2 mmol/L (ref 3.5–5.1)
Sodium: 138 mmol/L (ref 135–145)
Total Bilirubin: 1.4 mg/dL — ABNORMAL HIGH (ref 0.3–1.2)
Total Protein: 7.3 g/dL (ref 6.5–8.1)

## 2018-06-03 LAB — CBC
HCT: 43.2 % (ref 39.0–52.0)
Hemoglobin: 13.7 g/dL (ref 13.0–17.0)
MCH: 27.4 pg (ref 26.0–34.0)
MCHC: 31.7 g/dL (ref 30.0–36.0)
MCV: 86.4 fL (ref 80.0–100.0)
Platelets: 189 10*3/uL (ref 150–400)
RBC: 5 MIL/uL (ref 4.22–5.81)
RDW: 13.7 % (ref 11.5–15.5)
WBC: 3.7 10*3/uL — ABNORMAL LOW (ref 4.0–10.5)
nRBC: 0 % (ref 0.0–0.2)

## 2018-06-03 LAB — HEMOGLOBIN A1C
Hgb A1c MFr Bld: 5 % (ref 4.8–5.6)
Mean Plasma Glucose: 96.8 mg/dL

## 2018-06-03 LAB — TYPE AND SCREEN
ABO/RH(D): B POS
Antibody Screen: NEGATIVE

## 2018-06-03 LAB — SURGICAL PCR SCREEN
MRSA, PCR: NEGATIVE
Staphylococcus aureus: NEGATIVE

## 2018-06-03 LAB — APTT: aPTT: 36 seconds (ref 24–36)

## 2018-06-03 LAB — PROTIME-INR
INR: 1.4 — ABNORMAL HIGH (ref 0.8–1.2)
Prothrombin Time: 17.3 seconds — ABNORMAL HIGH (ref 11.4–15.2)

## 2018-06-03 LAB — ABO/RH: ABO/RH(D): B POS

## 2018-06-03 NOTE — Progress Notes (Signed)
PCP -  [name] Cardiologist - Tonny Bollman, Lewayne Bunting  Chest x-ray - today EKG - today Stress Test -  ECHO - 3/20 Cardiac Cath - 3/20  Sleep Study - na CPAP -   Fasting Blood Sugar - na Checks Blood Sugar _____ times a day  Blood Thinner Instructions: stop xarelto 3 days prior to surgery Aspirin Instructions:  Anesthesia review: cardiac hx.-life vest  Patient denies shortness of breath, fever, cough and chest pain at PAT appointment   Patient verbalized understanding of instructions that were given to them at the PAT appointment. Patient was also instructed that they will need to review over the PAT instructions again at home before surgery.

## 2018-06-04 ENCOUNTER — Other Ambulatory Visit (HOSPITAL_COMMUNITY)
Admission: RE | Admit: 2018-06-04 | Discharge: 2018-06-04 | Disposition: A | Discharge status: Home / Self Care | Payer: BC Managed Care – PPO | Source: Ambulatory Visit | Attending: Cardiothoracic Surgery | Admitting: Cardiothoracic Surgery

## 2018-06-04 ENCOUNTER — Encounter (HOSPITAL_COMMUNITY): Payer: Self-pay

## 2018-06-04 DIAGNOSIS — I351 Nonrheumatic aortic (valve) insufficiency: Secondary | ICD-10-CM | POA: Insufficient documentation

## 2018-06-04 DIAGNOSIS — Z1159 Encounter for screening for other viral diseases: Secondary | ICD-10-CM | POA: Diagnosis not present

## 2018-06-04 NOTE — Anesthesia Preprocedure Evaluation (Addendum)
Anesthesia Evaluation  Patient identified by MRN, date of birth, ID band Patient awake    Reviewed: Allergy & Precautions, NPO status , Patient's Chart, lab work & pertinent test results  Airway Mallampati: II  TM Distance: >3 FB     Dental   Pulmonary pneumonia,    breath sounds clear to auscultation       Cardiovascular + dysrhythmias  Rhythm:Regular Rate:Normal + Systolic murmurs    Neuro/Psych    GI/Hepatic negative GI ROS, Neg liver ROS,   Endo/Other  negative endocrine ROS  Renal/GU negative Renal ROS     Musculoskeletal   Abdominal   Peds  Hematology   Anesthesia Other Findings   Reproductive/Obstetrics                             Anesthesia Physical Anesthesia Plan  ASA: III  Anesthesia Plan: General   Post-op Pain Management:    Induction: Intravenous  PONV Risk Score and Plan: 2 and Ondansetron and Midazolam  Airway Management Planned: Oral ETT  Additional Equipment:   Intra-op Plan:   Post-operative Plan: Post-operative intubation/ventilation  Informed Consent: I have reviewed the patients History and Physical, chart, labs and discussed the procedure including the risks, benefits and alternatives for the proposed anesthesia with the patient or authorized representative who has indicated his/her understanding and acceptance.     Dental advisory given  Plan Discussed with: CRNA and Anesthesiologist  Anesthesia Plan Comments: (PAT note written 06/04/2018 by Shonna Chock, PA-C. )       Anesthesia Quick Evaluation

## 2018-06-04 NOTE — Progress Notes (Signed)
Anesthesia Chart Review:  Case:  [id number] Date/Time:  06/08/18 0715   Procedures:      AORTIC VALVE REPLACEMENT (AVR) (N/A )     possible ASCENDING AORTIC ROOT REPLACEMENT (N/A Chest)     REPLACEMENT ASCENDING AORTA (N/A Chest)     TRANSESOPHAGEAL ECHOCARDIOGRAM (TEE) (N/A )   Anesthesia type:  General   Pre-op diagnosis:  AI   [facility]  [facility] ROOM 14 / [facility] OR   Surgeon:  Delight Ovens, MD      DISCUSSION: Patient is a 63 year old male scheduled for the above procedure.  History includes out-of-hospital vfib arrest 03/24/18 (wifre performed CPR, until EMS arrived s/p successful defibrillator, s/p therapeutic hypothermia protocol), LV dysfunction (EF 45-50% 03/2018), left BBB, bicuspid AV with severe AI, ascending TAA, PE (RLL, RML non-occlusive PE 04/04/18, prescribed Xarelto), never smoker.   He currently wears a Technical sales engineer due to VF arrest history, and EP will consider placing an ICD in the future after AVR. He is aware to hold Xarelto 3 days prior to surgery per surgeon. He is for presurgical COVID test on 06/04/18.   VS: BP (!) 164/79   Pulse (!) 59   Temp 36.9 C   Resp 18   Ht 5\' 9"  (1.753 m)   Wt 70 kg   SpO2 100%   BMI 22.80 kg/m   PROVIDERS: [name], MD is PCP Tonny Bollman, MD is primary cardiologist Lewayne Bunting, MD is EP cardiologist. Last visit 05/13/18. He will plan to repeat echo after AVR, but anticipates he will eventually need a single versus dual chamber BiV ICD.    LABS: Labs reviewed: Acceptable for surgery. (all labs ordered are listed, but only abnormal results are displayed)  Labs Reviewed  CBC - Abnormal; Notable for the following components:      Result Value   WBC 3.7 (*)    All other components within normal limits  COMPREHENSIVE METABOLIC PANEL - Abnormal; Notable for the following components:   CO2 20 (*)    Total Bilirubin 1.4 (*)    All other components within normal limits  PROTIME-INR - Abnormal; Notable for the following  components:   Prothrombin Time 17.3 (*)    INR 1.4 (*)    All other components within normal limits  SURGICAL PCR SCREEN  APTT  BLOOD GAS, ARTERIAL  HEMOGLOBIN A1C  URINALYSIS, ROUTINE W REFLEX MICROSCOPIC  TYPE AND SCREEN  ABO/RH    IMAGES: CXR 06/03/18: IMPRESSION: No active cardiopulmonary disease.  CTA chest/abd/pelvis 04/04/18: IMPRESSION: 1. New, acute subsegmental nonocclusive pulmonary embolism in branches of the right lower lobe and right middle lobe pulmonary arteries. Overall volume of thrombus burden is low. This thrombus was not present on the CTA dated 03/24/2018. 2. Mild aneurysmal disease involving the aortic root and ascending thoracic aorta with the root measuring 4.2 cm in greatest diameter and the ascending thoracic aorta measuring 4.3 cm in greatest diameter. Associated partially calcified aortic valve. The aortic arch is very tortuous distally and demonstrates some degree of focal kinking due to tortuosity without evidence of significant stenosis. No evidence of aortic dissection. 3. Left ventricular cavity dilatation. 4. No evidence of aneurysmal disease or significant obstructive disease involving the abdominal aorta, iliac arteries, common femoral arteries or visceral branches of the abdominal aorta.   EKG: 06/03/18: SB at 56 bpm with sinus arrhythmia. LAD. LBBB.   CV: Cardiac cath 04/02/18:  There is mild left ventricular systolic dysfunction.  LV end diastolic pressure is  normal.  The left ventricular ejection fraction is 45-50% by visual estimate.  There is severe (4+) aortic regurgitation. Normal right heart pressures. Mild global LV dysfunction with an EF 45 to 50%. Bicuspid aortic valve with severe 4+ aortic insufficiency, mild aortic stenosis. Dilated aortic root. Normal coronary arteries and a left dominant circulation. RECOMMENDATION: The patient will ultimately need surgical consultation for aortic valve  repair/replacement.  Echo 03/25/18: IMPRESSIONS  1. The left ventricle has mildly reduced systolic function, with an ejection fraction of 45-50%. The cavity size was mildly dilated. Left ventricular diastolic Doppler parameters are consistent with pseudonormalization.  2. The right ventricle has normal systolic function. The cavity was normal. There is no increase in right ventricular wall thickness.  3. Right atrial size was mildly dilated.  4. Trivial pericardial effusion is present.  5. Mild thickening of the mitral valve leaflet.  6. The aortic valveis bicuspid Aortic valve regurgitation is severe by color flow Doppler.  7. The aortic valve appears to be a bicuspid or functionally bicuspid AV. There is severe AI.  8. The aortic root is mildly dilated (4.10 cm).  9. There is mild dilatation of the ascending aorta (4.20 cm). There is concern for a Type A aortic dissection. Suggest STAT CT angiogram of the chest for further evaluation. 10. The inferior vena cava was normal in size with <50% respiratory variability. 11. The interatrial septum was not assessed. (No evidence of aortic dissection on 04/04/18 CTA chest/abd/pelvis)  Carotid US 04/02/18: Summary: Right Carotid: Velocities in the right ICA are consistent with a 1-39% stenosis. Left Carotid: Velocities in the left ICA are consistent with a 1-39% stenosis. Vertebrals: Bilateral vertebral arteries demonstrate antegrade flow.   Past Medical History:  Diagnosis Date  . Aortic insufficiency   . Cardiac arrest (HCC) 03/24/2018  . Dysrhythmia   . History of kidney stones   . Pneumonia    history of    Past Surgical History:  Procedure Laterality Date  . AORTIC ARCH ANGIOGRAPHY N/A 04/02/2018   Procedure: AORTIC ARCH ANGIOGRAPHY;  Surgeon: Lennette Bihari, MD;  Location: [facility] INVASIVE CV LAB;  Service: Cardiovascular;  Laterality: N/A;  . RIGHT/LEFT HEART CATH AND CORONARY ANGIOGRAPHY N/A 04/02/2018   Procedure: RIGHT/LEFT HEART  CATH AND CORONARY ANGIOGRAPHY;  Surgeon: Lennette Bihari, MD;  Location: [facility] INVASIVE CV LAB;  Service: Cardiovascular;  Laterality: N/A;    MEDICATIONS: . acetaminophen (TYLENOL) 325 MG tablet  . acetaminophen (TYLENOL) 500 MG tablet  . metoCLOPramide (REGLAN) 5 MG tablet  . pantoprazole (PROTONIX) 40 MG tablet  . Rivaroxaban (XARELTO) 20 MG TABS tablet  . senna-docusate (SENOKOT-S) 8.6-50 MG tablet   No current facility-administered medications for this encounter.     Shonna Chock, PA-C Surgical Short Stay/Anesthesiology [facility] Phone [phone] [facility] Phone [phone] 06/04/2018 11:26 AM

## 2018-06-05 ENCOUNTER — Encounter (HOSPITAL_COMMUNITY): Payer: Self-pay | Admitting: Certified Registered Nurse Anesthetist

## 2018-06-05 LAB — BLOOD GAS, ARTERIAL
Acid-Base Excess: 0.7 mmol/L (ref 0.0–2.0)
Bicarbonate: 25 mmol/L (ref 20.0–28.0)
Drawn by: 42180
FIO2: 0.21
O2 Saturation: 97.6 %
Patient temperature: 98.6
pCO2 arterial: 42 mmHg (ref 32.0–48.0)
pH, Arterial: 7.393 (ref 7.350–7.450)
pO2, Arterial: 99.8 mmHg (ref 83.0–108.0)

## 2018-06-05 LAB — NOVEL CORONAVIRUS, NAA (HOSP ORDER, SEND-OUT TO REF LAB; TAT 18-24 HRS): SARS-CoV-2, NAA: NOT DETECTED

## 2018-06-06 MED ORDER — MAGNESIUM SULFATE 50 % IJ SOLN
40.0000 meq | INTRAMUSCULAR | Status: DC
  Filled 2018-06-06: qty 9.85

## 2018-06-06 MED ORDER — DOPAMINE-DEXTROSE 3.2-5 MG/ML-% IV SOLN
0.0000 ug/kg/min | INTRAVENOUS | Status: DC
  Filled 2018-06-06: qty 250

## 2018-06-06 MED ORDER — VANCOMYCIN HCL 10 G IV SOLR
1250.0000 mg | INTRAVENOUS | Status: AC
  Administered 2018-06-08: 08:00:00 1250 mg via INTRAVENOUS
  Filled 2018-06-06: qty 1250

## 2018-06-06 MED ORDER — SODIUM CHLORIDE 0.9 % IV SOLN
1.5000 g | INTRAVENOUS | Status: AC
  Administered 2018-06-08: 1.5 g via INTRAVENOUS
  Administered 2018-06-08: .75 g via INTRAVENOUS
  Filled 2018-06-06: qty 1.5

## 2018-06-06 MED ORDER — SODIUM CHLORIDE 0.9 % IV SOLN
750.0000 mg | INTRAVENOUS | Status: DC
  Filled 2018-06-06: qty 750

## 2018-06-06 MED ORDER — DEXMEDETOMIDINE HCL IN NACL 400 MCG/100ML IV SOLN
0.1000 ug/kg/h | INTRAVENOUS | Status: AC
  Administered 2018-06-08: .7 ug/kg/h via INTRAVENOUS
  Filled 2018-06-06: qty 100

## 2018-06-06 MED ORDER — TRANEXAMIC ACID 1000 MG/10ML IV SOLN
1.5000 mg/kg/h | INTRAVENOUS | Status: AC
  Administered 2018-06-08: 1.5 mg/kg/h via INTRAVENOUS
  Filled 2018-06-06: qty 25

## 2018-06-06 MED ORDER — NITROGLYCERIN IN D5W 200-5 MCG/ML-% IV SOLN
2.0000 ug/min | INTRAVENOUS | Status: AC
  Administered 2018-06-08: 10:00:00 5 ug/min via INTRAVENOUS
  Filled 2018-06-06: qty 250

## 2018-06-06 MED ORDER — TRANEXAMIC ACID (OHS) PUMP PRIME SOLUTION
2.0000 mg/kg | INTRAVENOUS | Status: DC
  Filled 2018-06-06: qty 1.4

## 2018-06-06 MED ORDER — PLASMA-LYTE 148 IV SOLN
INTRAVENOUS | Status: DC
  Filled 2018-06-06: qty 2.5

## 2018-06-06 MED ORDER — EPINEPHRINE PF 1 MG/ML IJ SOLN
0.0000 ug/min | INTRAVENOUS | Status: DC
  Filled 2018-06-06: qty 4

## 2018-06-06 MED ORDER — PHENYLEPHRINE HCL-NACL 20-0.9 MG/250ML-% IV SOLN
30.0000 ug/min | INTRAVENOUS | Status: AC
  Administered 2018-06-08: 20 ug/min via INTRAVENOUS
  Filled 2018-06-06: qty 250

## 2018-06-06 MED ORDER — NOREPINEPHRINE 4 MG/250ML-% IV SOLN
0.0000 ug/min | INTRAVENOUS | Status: DC
  Filled 2018-06-06: qty 250

## 2018-06-06 MED ORDER — MILRINONE LACTATE IN DEXTROSE 20-5 MG/100ML-% IV SOLN
0.3000 ug/kg/min | INTRAVENOUS | Status: DC
  Filled 2018-06-06: qty 100

## 2018-06-06 MED ORDER — INSULIN REGULAR(HUMAN) IN NACL 100-0.9 UT/100ML-% IV SOLN
INTRAVENOUS | Status: AC
  Administered 2018-06-08: 09:00:00 .6 [IU]/h via INTRAVENOUS
  Filled 2018-06-06: qty 100

## 2018-06-06 MED ORDER — POTASSIUM CHLORIDE 2 MEQ/ML IV SOLN
80.0000 meq | INTRAVENOUS | Status: DC
  Filled 2018-06-06: qty 40

## 2018-06-06 MED ORDER — SODIUM CHLORIDE 0.9 % IV SOLN
INTRAVENOUS | Status: DC
  Filled 2018-06-06: qty 30

## 2018-06-06 MED ORDER — TRANEXAMIC ACID (OHS) BOLUS VIA INFUSION
15.0000 mg/kg | INTRAVENOUS | Status: AC
  Administered 2018-06-08: 1050 mg via INTRAVENOUS
  Filled 2018-06-06: qty 1050

## 2018-06-08 ENCOUNTER — Other Ambulatory Visit: Payer: Self-pay

## 2018-06-08 ENCOUNTER — Inpatient Hospital Stay (HOSPITAL_COMMUNITY): Payer: BC Managed Care – PPO

## 2018-06-08 ENCOUNTER — Encounter (HOSPITAL_COMMUNITY): Payer: Self-pay

## 2018-06-08 ENCOUNTER — Inpatient Hospital Stay (HOSPITAL_COMMUNITY): Payer: BC Managed Care – PPO | Admitting: Certified Registered Nurse Anesthetist

## 2018-06-08 ENCOUNTER — Encounter (HOSPITAL_COMMUNITY)
Admission: RE | Disposition: A | Discharge status: Home / Self Care | Payer: Self-pay | Source: Home / Self Care | Attending: Cardiothoracic Surgery

## 2018-06-08 ENCOUNTER — Inpatient Hospital Stay (HOSPITAL_COMMUNITY)
Admission: RE | Admit: 2018-06-08 | Discharge: 2018-06-13 | DRG: 220 | Disposition: A | Discharge status: Home / Self Care | Payer: BC Managed Care – PPO | Attending: Cardiothoracic Surgery | Admitting: Cardiothoracic Surgery

## 2018-06-08 ENCOUNTER — Inpatient Hospital Stay (HOSPITAL_COMMUNITY): Payer: BC Managed Care – PPO | Admitting: Vascular Surgery

## 2018-06-08 DIAGNOSIS — E8770 Fluid overload, unspecified: Secondary | ICD-10-CM | POA: Diagnosis not present

## 2018-06-08 DIAGNOSIS — N179 Acute kidney failure, unspecified: Secondary | ICD-10-CM | POA: Diagnosis not present

## 2018-06-08 DIAGNOSIS — J189 Pneumonia, unspecified organism: Secondary | ICD-10-CM | POA: Diagnosis not present

## 2018-06-08 DIAGNOSIS — I97791 Other intraoperative cardiac functional disturbances during other surgery: Secondary | ICD-10-CM | POA: Diagnosis not present

## 2018-06-08 DIAGNOSIS — I447 Left bundle-branch block, unspecified: Secondary | ICD-10-CM | POA: Diagnosis not present

## 2018-06-08 DIAGNOSIS — Q231 Congenital insufficiency of aortic valve: Principal | ICD-10-CM

## 2018-06-08 DIAGNOSIS — I469 Cardiac arrest, cause unspecified: Secondary | ICD-10-CM | POA: Diagnosis not present

## 2018-06-08 DIAGNOSIS — Z8042 Family history of malignant neoplasm of prostate: Secondary | ICD-10-CM | POA: Diagnosis not present

## 2018-06-08 DIAGNOSIS — I472 Ventricular tachycardia: Secondary | ICD-10-CM | POA: Diagnosis not present

## 2018-06-08 DIAGNOSIS — K5900 Constipation, unspecified: Secondary | ICD-10-CM | POA: Diagnosis not present

## 2018-06-08 DIAGNOSIS — D62 Acute posthemorrhagic anemia: Secondary | ICD-10-CM | POA: Diagnosis not present

## 2018-06-08 DIAGNOSIS — J9601 Acute respiratory failure with hypoxia: Secondary | ICD-10-CM | POA: Diagnosis not present

## 2018-06-08 DIAGNOSIS — Z9689 Presence of other specified functional implants: Secondary | ICD-10-CM

## 2018-06-08 DIAGNOSIS — Z1159 Encounter for screening for other viral diseases: Secondary | ICD-10-CM | POA: Diagnosis not present

## 2018-06-08 DIAGNOSIS — Z7901 Long term (current) use of anticoagulants: Secondary | ICD-10-CM

## 2018-06-08 DIAGNOSIS — I440 Atrioventricular block, first degree: Secondary | ICD-10-CM | POA: Diagnosis not present

## 2018-06-08 DIAGNOSIS — Z954 Presence of other heart-valve replacement: Secondary | ICD-10-CM | POA: Diagnosis not present

## 2018-06-08 DIAGNOSIS — I480 Paroxysmal atrial fibrillation: Secondary | ICD-10-CM | POA: Diagnosis not present

## 2018-06-08 DIAGNOSIS — D696 Thrombocytopenia, unspecified: Secondary | ICD-10-CM | POA: Diagnosis present

## 2018-06-08 DIAGNOSIS — Z952 Presence of prosthetic heart valve: Secondary | ICD-10-CM

## 2018-06-08 DIAGNOSIS — Z8674 Personal history of sudden cardiac arrest: Secondary | ICD-10-CM

## 2018-06-08 DIAGNOSIS — Z86711 Personal history of pulmonary embolism: Secondary | ICD-10-CM | POA: Diagnosis not present

## 2018-06-08 DIAGNOSIS — I4901 Ventricular fibrillation: Secondary | ICD-10-CM | POA: Diagnosis not present

## 2018-06-08 DIAGNOSIS — I351 Nonrheumatic aortic (valve) insufficiency: Secondary | ICD-10-CM

## 2018-06-08 DIAGNOSIS — Z09 Encounter for follow-up examination after completed treatment for conditions other than malignant neoplasm: Secondary | ICD-10-CM

## 2018-06-08 DIAGNOSIS — I77810 Thoracic aortic ectasia: Secondary | ICD-10-CM | POA: Diagnosis not present

## 2018-06-08 DIAGNOSIS — I2699 Other pulmonary embolism without acute cor pulmonale: Secondary | ICD-10-CM | POA: Diagnosis not present

## 2018-06-08 DIAGNOSIS — Z87442 Personal history of urinary calculi: Secondary | ICD-10-CM

## 2018-06-08 DIAGNOSIS — I340 Nonrheumatic mitral (valve) insufficiency: Secondary | ICD-10-CM | POA: Diagnosis not present

## 2018-06-08 DIAGNOSIS — R918 Other nonspecific abnormal finding of lung field: Secondary | ICD-10-CM | POA: Diagnosis not present

## 2018-06-08 DIAGNOSIS — I350 Nonrheumatic aortic (valve) stenosis: Secondary | ICD-10-CM | POA: Diagnosis not present

## 2018-06-08 DIAGNOSIS — J9811 Atelectasis: Secondary | ICD-10-CM | POA: Diagnosis not present

## 2018-06-08 DIAGNOSIS — I358 Other nonrheumatic aortic valve disorders: Secondary | ICD-10-CM | POA: Diagnosis not present

## 2018-06-08 DIAGNOSIS — J90 Pleural effusion, not elsewhere classified: Secondary | ICD-10-CM | POA: Diagnosis not present

## 2018-06-08 HISTORY — PX: AORTIC VALVE REPLACEMENT: SHX41

## 2018-06-08 HISTORY — PX: TEE WITHOUT CARDIOVERSION: SHX5443

## 2018-06-08 LAB — CBC
HCT: 33.4 % — ABNORMAL LOW (ref 39.0–52.0)
HCT: 34.6 % — ABNORMAL LOW (ref 39.0–52.0)
Hemoglobin: 10.8 g/dL — ABNORMAL LOW (ref 13.0–17.0)
Hemoglobin: 11.3 g/dL — ABNORMAL LOW (ref 13.0–17.0)
MCH: 27.7 pg (ref 26.0–34.0)
MCH: 27.8 pg (ref 26.0–34.0)
MCHC: 32.3 g/dL (ref 30.0–36.0)
MCHC: 32.7 g/dL (ref 30.0–36.0)
MCV: 85.6 fL (ref 80.0–100.0)
MCV: 85 fL (ref 80.0–100.0)
Platelets: 126 10*3/uL — ABNORMAL LOW (ref 150–400)
Platelets: 128 10*3/uL — ABNORMAL LOW (ref 150–400)
RBC: 3.9 MIL/uL — ABNORMAL LOW (ref 4.22–5.81)
RBC: 4.07 MIL/uL — ABNORMAL LOW (ref 4.22–5.81)
RDW: 13.7 % (ref 11.5–15.5)
RDW: 13.5 % (ref 11.5–15.5)
WBC: 13.9 10*3/uL — ABNORMAL HIGH (ref 4.0–10.5)
WBC: 12.9 10*3/uL — ABNORMAL HIGH (ref 4.0–10.5)
nRBC: 0 % (ref 0.0–0.2)
nRBC: 0 % (ref 0.0–0.2)

## 2018-06-08 LAB — CREATININE, SERUM
Creatinine, Ser: 1.05 mg/dL (ref 0.61–1.24)
GFR calc Af Amer: >60 mL/min (ref >60)
GFR calc non Af Amer: >60 mL/min (ref >60)

## 2018-06-08 LAB — POCT I-STAT 4, (NA,K, GLUC, HGB,HCT)
Glucose, Bld: 92 mg/dL (ref 70–99)
Glucose, Bld: 109 mg/dL — ABNORMAL HIGH (ref 70–99)
Glucose, Bld: 96 mg/dL (ref 70–99)
Glucose, Bld: 186 mg/dL — ABNORMAL HIGH (ref 70–99)
Glucose, Bld: 174 mg/dL — ABNORMAL HIGH (ref 70–99)
Glucose, Bld: 184 mg/dL — ABNORMAL HIGH (ref 70–99)
HCT: 39 % (ref 39.0–52.0)
HCT: 30 % — ABNORMAL LOW (ref 39.0–52.0)
HCT: 37 % — ABNORMAL LOW (ref 39.0–52.0)
HCT: 28 % — ABNORMAL LOW (ref 39.0–52.0)
HCT: 27 % — ABNORMAL LOW (ref 39.0–52.0)
HCT: 29 % — ABNORMAL LOW (ref 39.0–52.0)
Hemoglobin: 13.3 g/dL (ref 13.0–17.0)
Hemoglobin: 10.2 g/dL — ABNORMAL LOW (ref 13.0–17.0)
Hemoglobin: 12.6 g/dL — ABNORMAL LOW (ref 13.0–17.0)
Hemoglobin: 9.5 g/dL — ABNORMAL LOW (ref 13.0–17.0)
Hemoglobin: 9.2 g/dL — ABNORMAL LOW (ref 13.0–17.0)
Hemoglobin: 9.9 g/dL — ABNORMAL LOW (ref 13.0–17.0)
Potassium: 4.5 mmol/L (ref 3.5–5.1)
Potassium: 4.2 mmol/L (ref 3.5–5.1)
Potassium: 4.5 mmol/L (ref 3.5–5.1)
Potassium: 4.5 mmol/L (ref 3.5–5.1)
Potassium: 4.6 mmol/L (ref 3.5–5.1)
Potassium: 5.1 mmol/L (ref 3.5–5.1)
Sodium: 139 mmol/L (ref 135–145)
Sodium: 139 mmol/L (ref 135–145)
Sodium: 139 mmol/L (ref 135–145)
Sodium: 135 mmol/L (ref 135–145)
Sodium: 136 mmol/L (ref 135–145)
Sodium: 138 mmol/L (ref 135–145)

## 2018-06-08 LAB — PROTIME-INR
INR: 1.2 (ref 0.8–1.2)
INR: 1.9 — ABNORMAL HIGH (ref 0.8–1.2)
Prothrombin Time: 15 seconds (ref 11.4–15.2)
Prothrombin Time: 21.5 seconds — ABNORMAL HIGH (ref 11.4–15.2)

## 2018-06-08 LAB — POCT I-STAT 7, (LYTES, BLD GAS, ICA,H+H)
Acid-Base Excess: 1 mmol/L (ref 0.0–2.0)
Acid-base deficit: 1 mmol/L (ref 0.0–2.0)
Acid-base deficit: 4 mmol/L — ABNORMAL HIGH (ref 0.0–2.0)
Acid-base deficit: 2 mmol/L (ref 0.0–2.0)
Acid-base deficit: 5 mmol/L — ABNORMAL HIGH (ref 0.0–2.0)
Bicarbonate: 25.3 mmol/L (ref 20.0–28.0)
Bicarbonate: 24.7 mmol/L (ref 20.0–28.0)
Bicarbonate: 21.1 mmol/L (ref 20.0–28.0)
Bicarbonate: 22.2 mmol/L (ref 20.0–28.0)
Bicarbonate: 21.2 mmol/L (ref 20.0–28.0)
Calcium, Ion: 0.93 mmol/L — ABNORMAL LOW (ref 1.15–1.40)
Calcium, Ion: 1.08 mmol/L — ABNORMAL LOW (ref 1.15–1.40)
Calcium, Ion: 1.15 mmol/L (ref 1.15–1.40)
Calcium, Ion: 1.16 mmol/L (ref 1.15–1.40)
Calcium, Ion: 1.17 mmol/L (ref 1.15–1.40)
HCT: 30 % — ABNORMAL LOW (ref 39.0–52.0)
HCT: 28 % — ABNORMAL LOW (ref 39.0–52.0)
HCT: 34 % — ABNORMAL LOW (ref 39.0–52.0)
HCT: 32 % — ABNORMAL LOW (ref 39.0–52.0)
HCT: 34 % — ABNORMAL LOW (ref 39.0–52.0)
Hemoglobin: 10.2 g/dL — ABNORMAL LOW (ref 13.0–17.0)
Hemoglobin: 9.5 g/dL — ABNORMAL LOW (ref 13.0–17.0)
Hemoglobin: 11.6 g/dL — ABNORMAL LOW (ref 13.0–17.0)
Hemoglobin: 10.9 g/dL — ABNORMAL LOW (ref 13.0–17.0)
Hemoglobin: 11.6 g/dL — ABNORMAL LOW (ref 13.0–17.0)
O2 Saturation: 100 %
O2 Saturation: 100 %
O2 Saturation: 97 %
O2 Saturation: 100 %
O2 Saturation: 100 %
Patient temperature: 35.5
Patient temperature: 36.6
Patient temperature: 37.2
Potassium: 4.6 mmol/L (ref 3.5–5.1)
Potassium: 4.5 mmol/L (ref 3.5–5.1)
Potassium: 4.3 mmol/L (ref 3.5–5.1)
Potassium: 4.1 mmol/L (ref 3.5–5.1)
Potassium: 4.2 mmol/L (ref 3.5–5.1)
Sodium: 139 mmol/L (ref 135–145)
Sodium: 137 mmol/L (ref 135–145)
Sodium: 140 mmol/L (ref 135–145)
Sodium: 138 mmol/L (ref 135–145)
Sodium: 138 mmol/L (ref 135–145)
TCO2: 26 mmol/L (ref 22–32)
TCO2: 26 mmol/L (ref 22–32)
TCO2: 22 mmol/L (ref 22–32)
TCO2: 23 mmol/L (ref 22–32)
TCO2: 22 mmol/L (ref 22–32)
pCO2 arterial: 37.7 mmHg (ref 32.0–48.0)
pCO2 arterial: 45.7 mmHg (ref 32.0–48.0)
pCO2 arterial: 33.5 mmHg (ref 32.0–48.0)
pCO2 arterial: 35.5 mmHg (ref 32.0–48.0)
pCO2 arterial: 40.9 mmHg (ref 32.0–48.0)
pH, Arterial: 7.434 (ref 7.350–7.450)
pH, Arterial: 7.341 — ABNORMAL LOW (ref 7.350–7.450)
pH, Arterial: 7.401 (ref 7.350–7.450)
pH, Arterial: 7.403 (ref 7.350–7.450)
pH, Arterial: 7.323 — ABNORMAL LOW (ref 7.350–7.450)
pO2, Arterial: 397 mmHg — ABNORMAL HIGH (ref 83.0–108.0)
pO2, Arterial: 444 mmHg — ABNORMAL HIGH (ref 83.0–108.0)
pO2, Arterial: 89 mmHg (ref 83.0–108.0)
pO2, Arterial: 166 mmHg — ABNORMAL HIGH (ref 83.0–108.0)
pO2, Arterial: 183 mmHg — ABNORMAL HIGH (ref 83.0–108.0)

## 2018-06-08 LAB — GLUCOSE, CAPILLARY
Glucose-Capillary: 100 mg/dL — ABNORMAL HIGH (ref 70–99)
Glucose-Capillary: 103 mg/dL — ABNORMAL HIGH (ref 70–99)
Glucose-Capillary: 124 mg/dL — ABNORMAL HIGH (ref 70–99)
Glucose-Capillary: 128 mg/dL — ABNORMAL HIGH (ref 70–99)
Glucose-Capillary: 128 mg/dL — ABNORMAL HIGH (ref 70–99)
Glucose-Capillary: 90 mg/dL (ref 70–99)

## 2018-06-08 LAB — HEMOGLOBIN AND HEMATOCRIT, BLOOD
HCT: 28.8 % — ABNORMAL LOW (ref 39.0–52.0)
Hemoglobin: 9.1 g/dL — ABNORMAL LOW (ref 13.0–17.0)

## 2018-06-08 LAB — POCT I-STAT, CHEM 8
BUN: 10 mg/dL (ref 8–23)
BUN: 10 mg/dL (ref 8–23)
Calcium, Ion: 1.16 mmol/L (ref 1.15–1.40)
Calcium, Ion: 1.19 mmol/L (ref 1.15–1.40)
Chloride: 105 mmol/L (ref 98–111)
Chloride: 105 mmol/L (ref 98–111)
Creatinine, Ser: 0.9 mg/dL (ref 0.61–1.24)
Creatinine, Ser: 0.9 mg/dL (ref 0.61–1.24)
Glucose, Bld: 126 mg/dL — ABNORMAL HIGH (ref 70–99)
Glucose, Bld: 131 mg/dL — ABNORMAL HIGH (ref 70–99)
HCT: 33 % — ABNORMAL LOW (ref 39.0–52.0)
HCT: 35 % — ABNORMAL LOW (ref 39.0–52.0)
Hemoglobin: 11.2 g/dL — ABNORMAL LOW (ref 13.0–17.0)
Hemoglobin: 11.9 g/dL — ABNORMAL LOW (ref 13.0–17.0)
Potassium: 4.3 mmol/L (ref 3.5–5.1)
Potassium: 4.2 mmol/L (ref 3.5–5.1)
Sodium: 139 mmol/L (ref 135–145)
Sodium: 137 mmol/L (ref 135–145)
TCO2: 22 mmol/L (ref 22–32)
TCO2: 22 mmol/L (ref 22–32)

## 2018-06-08 LAB — MAGNESIUM: Magnesium: 3.2 mg/dL — ABNORMAL HIGH (ref 1.7–2.4)

## 2018-06-08 LAB — APTT: aPTT: 42 seconds — ABNORMAL HIGH (ref 24–36)

## 2018-06-08 LAB — PLATELET COUNT: Platelets: 153 10*3/uL (ref 150–400)

## 2018-06-08 SURGERY — REPLACEMENT, AORTIC VALVE, OPEN
Anesthesia: General | Site: Chest

## 2018-06-08 MED ORDER — LACTATED RINGERS IV SOLN: INTRAVENOUS | Status: DC

## 2018-06-08 MED ORDER — FENTANYL CITRATE (PF) 250 MCG/5ML IJ SOLN
INTRAMUSCULAR | Status: DC | PRN
  Administered 2018-06-08: 150 ug via INTRAVENOUS
  Administered 2018-06-08: 250 ug via INTRAVENOUS
  Administered 2018-06-08: 25 ug via INTRAVENOUS
  Administered 2018-06-08: 250 ug via INTRAVENOUS
  Administered 2018-06-08 (×2): 100 ug via INTRAVENOUS
  Administered 2018-06-08: 50 ug via INTRAVENOUS
  Administered 2018-06-08: 175 ug via INTRAVENOUS
  Administered 2018-06-08: 150 ug via INTRAVENOUS

## 2018-06-08 MED ORDER — LACTATED RINGERS IV SOLN: 500.0000 mL | Freq: Once | INTRAVENOUS | Status: DC | PRN

## 2018-06-08 MED ORDER — INSULIN REGULAR BOLUS VIA INFUSION
0.0000 [IU] | Freq: Three times a day (TID) | INTRAVENOUS | Status: DC
  Filled 2018-06-08: qty 10

## 2018-06-08 MED ORDER — SODIUM CHLORIDE 0.9 % IV SOLN
INTRAVENOUS | Status: DC | PRN
  Administered 2018-06-08: 07:00:00 via INTRAVENOUS

## 2018-06-08 MED ORDER — METOCLOPRAMIDE HCL 5 MG/ML IJ SOLN
10.0000 mg | Freq: Four times a day (QID) | INTRAMUSCULAR | Status: DC
  Administered 2018-06-08 – 2018-06-11 (×12): 10 mg via INTRAVENOUS
  Filled 2018-06-08 (×12): qty 2

## 2018-06-08 MED ORDER — AMIODARONE HCL IN DEXTROSE 360-4.14 MG/200ML-% IV SOLN
60.0000 mg/h | INTRAVENOUS | Status: DC
  Filled 2018-06-08: qty 200

## 2018-06-08 MED ORDER — ALBUMIN HUMAN 5 % IV SOLN
INTRAVENOUS | Status: DC | PRN
  Administered 2018-06-08 (×2): via INTRAVENOUS

## 2018-06-08 MED ORDER — PROPOFOL 10 MG/ML IV BOLUS
INTRAVENOUS | Status: AC
  Filled 2018-06-08: qty 20

## 2018-06-08 MED ORDER — AMIODARONE HCL IN DEXTROSE 360-4.14 MG/200ML-% IV SOLN
30.0000 mg/h | INTRAVENOUS | Status: DC
  Administered 2018-06-08 – 2018-06-09 (×3): 30 mg/h via INTRAVENOUS
  Filled 2018-06-08 (×2): qty 200

## 2018-06-08 MED ORDER — SODIUM CHLORIDE 0.9 % IV SOLN: 250.0000 mL | INTRAVENOUS | Status: DC

## 2018-06-08 MED ORDER — HEPARIN SODIUM (PORCINE) 1000 UNIT/ML IJ SOLN
INTRAMUSCULAR | Status: DC | PRN
  Administered 2018-06-08: 25000 [IU] via INTRAVENOUS

## 2018-06-08 MED ORDER — DOPAMINE-DEXTROSE 1.6-5 MG/ML-% IV SOLN
INTRAVENOUS | Status: DC | PRN
  Administered 2018-06-08: 3 ug/kg/min via INTRAVENOUS

## 2018-06-08 MED ORDER — SUCCINYLCHOLINE CHLORIDE 200 MG/10ML IV SOSY
PREFILLED_SYRINGE | INTRAVENOUS | Status: AC
  Filled 2018-06-08: qty 10

## 2018-06-08 MED ORDER — ASPIRIN EC 325 MG PO TBEC
325.0000 mg | DELAYED_RELEASE_TABLET | Freq: Every day | ORAL | Status: DC
  Administered 2018-06-09 – 2018-06-10 (×2): 325 mg via ORAL
  Filled 2018-06-08 (×2): qty 1

## 2018-06-08 MED ORDER — METOPROLOL TARTRATE 12.5 MG HALF TABLET
12.5000 mg | ORAL_TABLET | Freq: Two times a day (BID) | ORAL | Status: DC
  Administered 2018-06-09 – 2018-06-10 (×3): 12.5 mg via ORAL
  Filled 2018-06-08 (×3): qty 1

## 2018-06-08 MED ORDER — DEXMEDETOMIDINE HCL IN NACL 200 MCG/50ML IV SOLN
0.0000 ug/kg/h | INTRAVENOUS | Status: DC
  Filled 2018-06-08: qty 50

## 2018-06-08 MED ORDER — SODIUM CHLORIDE 0.9% FLUSH: 10.0000 mL | INTRAVENOUS | Status: DC | PRN

## 2018-06-08 MED ORDER — OXYCODONE HCL 5 MG PO TABS
5.0000 mg | ORAL_TABLET | ORAL | Status: DC | PRN
  Administered 2018-06-08 – 2018-06-09 (×2): 10 mg via ORAL
  Administered 2018-06-10: 04:00:00 5 mg via ORAL
  Filled 2018-06-08: qty 1
  Filled 2018-06-08 (×2): qty 2

## 2018-06-08 MED ORDER — BISACODYL 10 MG RE SUPP: 10.0000 mg | Freq: Every day | RECTAL | Status: DC

## 2018-06-08 MED ORDER — SODIUM CHLORIDE 0.9 % IV SOLN
INTRAVENOUS | Status: DC
  Administered 2018-06-09: 17:00:00 via INTRAVENOUS

## 2018-06-08 MED ORDER — ACETAMINOPHEN 160 MG/5ML PO SOLN: 650.0000 mg | Freq: Once | ORAL | Status: AC

## 2018-06-08 MED ORDER — ENOXAPARIN SODIUM 30 MG/0.3ML ~~LOC~~ SOLN: 30.0000 mg | SUBCUTANEOUS | Status: DC

## 2018-06-08 MED ORDER — PROTAMINE SULFATE 10 MG/ML IV SOLN
INTRAVENOUS | Status: DC | PRN
  Administered 2018-06-08: 250 mg via INTRAVENOUS

## 2018-06-08 MED ORDER — PROPOFOL 10 MG/ML IV BOLUS
INTRAVENOUS | Status: DC | PRN
  Administered 2018-06-08: 50 mg via INTRAVENOUS
  Administered 2018-06-08: 30 mg via INTRAVENOUS
  Administered 2018-06-08: 120 mg via INTRAVENOUS

## 2018-06-08 MED ORDER — SODIUM CHLORIDE 0.9 % IV SOLN: INTRAVENOUS | Status: DC | PRN

## 2018-06-08 MED ORDER — MIDAZOLAM HCL (PF) 10 MG/2ML IJ SOLN
INTRAMUSCULAR | Status: AC
  Filled 2018-06-08: qty 2

## 2018-06-08 MED ORDER — METOPROLOL TARTRATE 25 MG/10 ML ORAL SUSPENSION: 12.5000 mg | Freq: Two times a day (BID) | ORAL | Status: DC

## 2018-06-08 MED ORDER — MIDAZOLAM HCL 2 MG/2ML IJ SOLN
INTRAMUSCULAR | Status: DC | PRN
  Administered 2018-06-08 (×2): 2 mg via INTRAVENOUS

## 2018-06-08 MED ORDER — ORAL CARE MOUTH RINSE
15.0000 mL | Freq: Two times a day (BID) | OROMUCOSAL | Status: DC
  Administered 2018-06-08: 22:00:00 15 mL via OROMUCOSAL

## 2018-06-08 MED ORDER — INSULIN ASPART 100 UNIT/ML ~~LOC~~ SOLN
0.0000 [IU] | SUBCUTANEOUS | Status: DC
  Administered 2018-06-08 – 2018-06-09 (×3): 2 [IU] via SUBCUTANEOUS

## 2018-06-08 MED ORDER — INSULIN REGULAR(HUMAN) IN NACL 100-0.9 UT/100ML-% IV SOLN: INTRAVENOUS | Status: DC

## 2018-06-08 MED ORDER — VANCOMYCIN HCL IN DEXTROSE 1-5 GM/200ML-% IV SOLN
1000.0000 mg | Freq: Once | INTRAVENOUS | Status: AC
  Administered 2018-06-08: 20:00:00 1000 mg via INTRAVENOUS
  Filled 2018-06-08: qty 200

## 2018-06-08 MED ORDER — 0.9 % SODIUM CHLORIDE (POUR BTL) OPTIME
TOPICAL | Status: DC | PRN
  Administered 2018-06-08: 5000 mL

## 2018-06-08 MED ORDER — CHLORHEXIDINE GLUCONATE 0.12% ORAL RINSE (MEDLINE KIT): 15.0000 mL | Freq: Two times a day (BID) | OROMUCOSAL | Status: DC

## 2018-06-08 MED ORDER — PANTOPRAZOLE SODIUM 40 MG PO TBEC
40.0000 mg | DELAYED_RELEASE_TABLET | Freq: Every day | ORAL | Status: DC
  Administered 2018-06-10: 10:00:00 40 mg via ORAL
  Filled 2018-06-08: qty 1

## 2018-06-08 MED ORDER — FAMOTIDINE IN NACL 20-0.9 MG/50ML-% IV SOLN: 20.0000 mg | Freq: Two times a day (BID) | INTRAVENOUS | Status: DC

## 2018-06-08 MED ORDER — MIDAZOLAM HCL 2 MG/2ML IJ SOLN: 2.0000 mg | INTRAMUSCULAR | Status: DC | PRN

## 2018-06-08 MED ORDER — POTASSIUM CHLORIDE 10 MEQ/50ML IV SOLN: 10.0000 meq | INTRAVENOUS | Status: AC

## 2018-06-08 MED ORDER — SODIUM CHLORIDE 0.9% FLUSH: 3.0000 mL | INTRAVENOUS | Status: DC | PRN

## 2018-06-08 MED ORDER — METOPROLOL TARTRATE 12.5 MG HALF TABLET: 12.5000 mg | ORAL_TABLET | Freq: Once | ORAL | Status: DC

## 2018-06-08 MED ORDER — SODIUM CHLORIDE 0.45 % IV SOLN: INTRAVENOUS | Status: DC | PRN

## 2018-06-08 MED ORDER — MAGNESIUM SULFATE 4 GM/100ML IV SOLN
4.0000 g | Freq: Once | INTRAVENOUS | Status: AC
  Administered 2018-06-08: 14:00:00 4 g via INTRAVENOUS
  Filled 2018-06-08: qty 100

## 2018-06-08 MED ORDER — MILRINONE LACTATE IN DEXTROSE 20-5 MG/100ML-% IV SOLN
0.0620 ug/kg/min | INTRAVENOUS | Status: DC
  Administered 2018-06-09: 21:00:00 0.062 ug/kg/min via INTRAVENOUS
  Filled 2018-06-08: qty 100

## 2018-06-08 MED ORDER — ORAL CARE MOUTH RINSE
15.0000 mL | OROMUCOSAL | Status: DC
  Administered 2018-06-08: 15 mL via OROMUCOSAL

## 2018-06-08 MED ORDER — CHLORHEXIDINE GLUCONATE CLOTH 2 % EX PADS
6.0000 | MEDICATED_PAD | Freq: Every day | CUTANEOUS | Status: DC
  Administered 2018-06-08 – 2018-06-13 (×4): 6 via TOPICAL

## 2018-06-08 MED ORDER — ACETAMINOPHEN 650 MG RE SUPP
650.0000 mg | Freq: Once | RECTAL | Status: AC
  Administered 2018-06-08: 650 mg via RECTAL

## 2018-06-08 MED ORDER — NITROGLYCERIN IN D5W 200-5 MCG/ML-% IV SOLN
0.0000 ug/min | INTRAVENOUS | Status: DC
  Administered 2018-06-08: 22:00:00 10 ug/min via INTRAVENOUS
  Filled 2018-06-08: qty 250

## 2018-06-08 MED ORDER — PHENYLEPHRINE 40 MCG/ML (10ML) SYRINGE FOR IV PUSH (FOR BLOOD PRESSURE SUPPORT)
PREFILLED_SYRINGE | INTRAVENOUS | Status: AC
  Filled 2018-06-08: qty 10

## 2018-06-08 MED ORDER — METOPROLOL TARTRATE 5 MG/5ML IV SOLN: 2.5000 mg | INTRAVENOUS | Status: DC | PRN

## 2018-06-08 MED ORDER — SODIUM CHLORIDE (PF) 0.9 % IJ SOLN
OROMUCOSAL | Status: DC | PRN
  Administered 2018-06-08 (×3): via TOPICAL

## 2018-06-08 MED ORDER — ONDANSETRON HCL 4 MG/2ML IJ SOLN
INTRAMUSCULAR | Status: AC
  Filled 2018-06-08: qty 2

## 2018-06-08 MED ORDER — LACTATED RINGERS IV SOLN
INTRAVENOUS | Status: DC | PRN
  Administered 2018-06-08: 07:00:00 via INTRAVENOUS

## 2018-06-08 MED ORDER — MILRINONE LACTATE IN DEXTROSE 20-5 MG/100ML-% IV SOLN
INTRAVENOUS | Status: DC | PRN
  Administered 2018-06-08: .125 ug/kg/min via INTRAVENOUS

## 2018-06-08 MED ORDER — ONDANSETRON HCL 4 MG/2ML IJ SOLN
INTRAMUSCULAR | Status: DC | PRN
  Administered 2018-06-08: 4 mg via INTRAVENOUS

## 2018-06-08 MED ORDER — FENTANYL CITRATE (PF) 250 MCG/5ML IJ SOLN
INTRAMUSCULAR | Status: AC
  Filled 2018-06-08: qty 25

## 2018-06-08 MED ORDER — BISACODYL 5 MG PO TBEC
10.0000 mg | DELAYED_RELEASE_TABLET | Freq: Every day | ORAL | Status: DC
  Administered 2018-06-09 – 2018-06-10 (×2): 10 mg via ORAL
  Filled 2018-06-08 (×2): qty 2

## 2018-06-08 MED ORDER — MORPHINE SULFATE (PF) 2 MG/ML IV SOLN
1.0000 mg | INTRAVENOUS | Status: DC | PRN
  Administered 2018-06-08 – 2018-06-09 (×2): 2 mg via INTRAVENOUS
  Filled 2018-06-08 (×2): qty 1

## 2018-06-08 MED ORDER — ONDANSETRON HCL 4 MG/2ML IJ SOLN
4.0000 mg | Freq: Four times a day (QID) | INTRAMUSCULAR | Status: DC | PRN
  Administered 2018-06-09: 15:00:00 4 mg via INTRAVENOUS
  Filled 2018-06-08: qty 2

## 2018-06-08 MED ORDER — CHLORHEXIDINE GLUCONATE 0.12 % MT SOLN
OROMUCOSAL | Status: AC
  Filled 2018-06-08: qty 15

## 2018-06-08 MED ORDER — SODIUM CHLORIDE 0.9 % IV SOLN
1.5000 g | Freq: Two times a day (BID) | INTRAVENOUS | Status: AC
  Administered 2018-06-08 – 2018-06-10 (×4): 1.5 g via INTRAVENOUS
  Filled 2018-06-08 (×4): qty 1.5

## 2018-06-08 MED ORDER — HEMOSTATIC AGENTS (NO CHARGE) OPTIME
TOPICAL | Status: DC | PRN
  Administered 2018-06-08 (×2): 1 via TOPICAL

## 2018-06-08 MED ORDER — DOPAMINE-DEXTROSE 3.2-5 MG/ML-% IV SOLN: 2.0000 ug/kg/min | INTRAVENOUS | Status: AC

## 2018-06-08 MED ORDER — PHENYLEPHRINE HCL-NACL 20-0.9 MG/250ML-% IV SOLN: 0.0000 ug/min | INTRAVENOUS | Status: DC

## 2018-06-08 MED ORDER — AMIODARONE HCL IN DEXTROSE 360-4.14 MG/200ML-% IV SOLN
60.0000 mg/h | INTRAVENOUS | Status: AC
  Administered 2018-06-08: 10:00:00 60 mg/h via INTRAVENOUS
  Filled 2018-06-08: qty 200

## 2018-06-08 MED ORDER — CHLORHEXIDINE GLUCONATE 0.12 % MT SOLN
15.0000 mL | Freq: Once | OROMUCOSAL | Status: AC
  Administered 2018-06-08: 06:00:00 15 mL via OROMUCOSAL

## 2018-06-08 MED ORDER — AMIODARONE HCL IN DEXTROSE 360-4.14 MG/200ML-% IV SOLN: 60.0000 mg/h | INTRAVENOUS | Status: AC

## 2018-06-08 MED ORDER — ACETAMINOPHEN 160 MG/5ML PO SOLN: 1000.0000 mg | Freq: Four times a day (QID) | ORAL | Status: DC

## 2018-06-08 MED ORDER — SUCCINYLCHOLINE CHLORIDE 200 MG/10ML IV SOSY
PREFILLED_SYRINGE | INTRAVENOUS | Status: DC | PRN
  Administered 2018-06-08: 120 mg via INTRAVENOUS

## 2018-06-08 MED ORDER — ACETAMINOPHEN 500 MG PO TABS
1000.0000 mg | ORAL_TABLET | Freq: Four times a day (QID) | ORAL | Status: DC
  Administered 2018-06-08 – 2018-06-11 (×10): 1000 mg via ORAL
  Filled 2018-06-08 (×10): qty 2

## 2018-06-08 MED ORDER — TRAMADOL HCL 50 MG PO TABS
50.0000 mg | ORAL_TABLET | ORAL | Status: DC | PRN
  Administered 2018-06-09: 22:00:00 50 mg via ORAL
  Filled 2018-06-08: qty 1

## 2018-06-08 MED ORDER — ROCURONIUM BROMIDE 10 MG/ML (PF) SYRINGE
PREFILLED_SYRINGE | INTRAVENOUS | Status: DC | PRN
  Administered 2018-06-08 (×3): 50 mg via INTRAVENOUS

## 2018-06-08 MED ORDER — AMIODARONE IV BOLUS ONLY 150 MG/100ML
INTRAVENOUS | Status: DC | PRN
  Administered 2018-06-08: 150 mg via INTRAVENOUS

## 2018-06-08 MED ORDER — METOPROLOL TARTRATE 12.5 MG HALF TABLET
ORAL_TABLET | ORAL | Status: AC
  Filled 2018-06-08: qty 1

## 2018-06-08 MED ORDER — CHLORHEXIDINE GLUCONATE CLOTH 2 % EX PADS
6.0000 | MEDICATED_PAD | Freq: Every day | CUTANEOUS | Status: DC
  Administered 2018-06-08: 15:00:00 6 via TOPICAL

## 2018-06-08 MED ORDER — CHLORHEXIDINE GLUCONATE 0.12 % MT SOLN
15.0000 mL | OROMUCOSAL | Status: AC
  Administered 2018-06-08: 15 mL via OROMUCOSAL

## 2018-06-08 MED ORDER — CHLORHEXIDINE GLUCONATE 4 % EX LIQD: 30.0000 mL | CUTANEOUS | Status: DC

## 2018-06-08 MED ORDER — ALBUMIN HUMAN 5 % IV SOLN
250.0000 mL | INTRAVENOUS | Status: AC | PRN
  Administered 2018-06-08 (×2): 12.5 g via INTRAVENOUS

## 2018-06-08 MED ORDER — ASPIRIN 81 MG PO CHEW: 324.0000 mg | CHEWABLE_TABLET | Freq: Every day | ORAL | Status: DC

## 2018-06-08 MED ORDER — ROCURONIUM BROMIDE 10 MG/ML (PF) SYRINGE
PREFILLED_SYRINGE | INTRAVENOUS | Status: AC
  Filled 2018-06-08: qty 20

## 2018-06-08 MED ORDER — SODIUM CHLORIDE 0.9% FLUSH
10.0000 mL | Freq: Two times a day (BID) | INTRAVENOUS | Status: DC
  Administered 2018-06-08 – 2018-06-10 (×4): 10 mL

## 2018-06-08 MED ORDER — DOCUSATE SODIUM 100 MG PO CAPS
200.0000 mg | ORAL_CAPSULE | Freq: Every day | ORAL | Status: DC
  Administered 2018-06-09 – 2018-06-10 (×2): 200 mg via ORAL
  Filled 2018-06-08 (×2): qty 2

## 2018-06-08 MED ORDER — MILRINONE LACTATE IN DEXTROSE 20-5 MG/100ML-% IV SOLN: 0.3000 ug/kg/min | INTRAVENOUS | Status: DC

## 2018-06-08 MED ORDER — SODIUM CHLORIDE 0.9% FLUSH
3.0000 mL | Freq: Two times a day (BID) | INTRAVENOUS | Status: DC
  Administered 2018-06-09 – 2018-06-10 (×3): 3 mL via INTRAVENOUS

## 2018-06-08 SURGICAL SUPPLY — 80 items
ADAPTER CARDIO PERF ANTE/RETRO (ADAPTER) ×3 IMPLANT
APPLICATOR COTTON TIP 6IN STRL (MISCELLANEOUS) IMPLANT
APPLICATOR TIP COSEAL (VASCULAR PRODUCTS) IMPLANT
BAG DECANTER FOR FLEXI CONT (MISCELLANEOUS) IMPLANT
BLADE STERNUM SYSTEM 6 (BLADE) ×3 IMPLANT
BLADE SURG 15 STRL LF DISP TIS (BLADE) ×2 IMPLANT
BLADE SURG 15 STRL SS (BLADE) ×1
BOOT SUTURE AID YELLOW STND (SUTURE) IMPLANT
CANISTER SUCT 3000ML PPV (MISCELLANEOUS) ×3 IMPLANT
CANN PRFSN .5XCNCT 15X34-48 (MISCELLANEOUS) ×2
CANNULA GUNDRY RCSP 15FR (MISCELLANEOUS) ×3 IMPLANT
CANNULA PRFSN .5XCNCT 15X34-48 (MISCELLANEOUS) ×2 IMPLANT
CANNULA VEN 2 STAGE (MISCELLANEOUS) ×1
CATH CPB KIT GERHARDT (MISCELLANEOUS) ×3 IMPLANT
CATH FOLEY 2WAY SLVR 18FR 30CC (CATHETERS) IMPLANT
CATH HEART VENT LEFT (CATHETERS) ×2 IMPLANT
CATH RETROPLEGIA CORONARY 14FR (CATHETERS) IMPLANT
CATH THORACIC 28FR (CATHETERS) ×3 IMPLANT
CATH/SQUID NICHOLS JEHLE COR (CATHETERS) IMPLANT
CONT SPEC 4OZ CLIKSEAL STRL BL (MISCELLANEOUS) ×3 IMPLANT
CRADLE DONUT ADULT HEAD (MISCELLANEOUS) ×3 IMPLANT
DRAIN CHANNEL 28F RND 3/8 FF (WOUND CARE) ×3 IMPLANT
DRAIN CHANNEL 32F RND 10.7 FF (WOUND CARE) ×3 IMPLANT
DRAPE CARDIOVASCULAR INCISE (DRAPES) ×1
DRAPE SLUSH/WARMER DISC (DRAPES) ×3 IMPLANT
DRAPE SRG 135X102X78XABS (DRAPES) ×2 IMPLANT
DRSG AQUACEL AG ADV 3.5X14 (GAUZE/BANDAGES/DRESSINGS) ×3 IMPLANT
ELECT BLADE 4.0 EZ CLEAN MEGAD (MISCELLANEOUS) ×3
ELECT CAUTERY BLADE 6.4 (BLADE) ×3 IMPLANT
ELECT REM PT RETURN 9FT ADLT (ELECTROSURGICAL) ×6
ELECTRODE BLDE 4.0 EZ CLN MEGD (MISCELLANEOUS) ×2 IMPLANT
ELECTRODE REM PT RTRN 9FT ADLT (ELECTROSURGICAL) ×4 IMPLANT
FELT TEFLON 1X6 (MISCELLANEOUS) ×3 IMPLANT
GAUZE SPONGE 4X4 12PLY STRL (GAUZE/BANDAGES/DRESSINGS) ×3 IMPLANT
GLOVE BIO SURGEON STRL SZ 6 (GLOVE) ×18 IMPLANT
GLOVE BIO SURGEON STRL SZ 6.5 (GLOVE) ×6 IMPLANT
GLOVE BIOGEL PI IND STRL 6 (GLOVE) ×6 IMPLANT
GLOVE BIOGEL PI IND STRL 7.5 (GLOVE) ×4 IMPLANT
GLOVE BIOGEL PI INDICATOR 6 (GLOVE) ×3
GLOVE BIOGEL PI INDICATOR 7.5 (GLOVE) ×2
GOWN STRL REUS W/ TWL LRG LVL3 (GOWN DISPOSABLE) ×20 IMPLANT
GOWN STRL REUS W/TWL LRG LVL3 (GOWN DISPOSABLE) ×10
HEMOSTAT POWDER SURGIFOAM 1G (HEMOSTASIS) ×9 IMPLANT
HEMOSTAT SURGICEL 2X14 (HEMOSTASIS) ×3 IMPLANT
KIT BASIN OR (CUSTOM PROCEDURE TRAY) ×3 IMPLANT
KIT SUCTION CATH 14FR (SUCTIONS) ×9 IMPLANT
KIT TURNOVER KIT B (KITS) ×3 IMPLANT
LEAD PACING MYOCARDI (MISCELLANEOUS) ×3 IMPLANT
LINE VENT (MISCELLANEOUS) ×3 IMPLANT
LOOP VESSEL SUPERMAXI WHITE (MISCELLANEOUS) IMPLANT
NS IRRIG 1000ML POUR BTL (IV SOLUTION) ×12 IMPLANT
PACK E OPEN HEART (SUTURE) ×3 IMPLANT
PACK OPEN HEART (CUSTOM PROCEDURE TRAY) ×3 IMPLANT
PAD ARMBOARD 7.5X6 YLW CONV (MISCELLANEOUS) ×6 IMPLANT
SEALANT SURG COSEAL 8ML (VASCULAR PRODUCTS) ×3 IMPLANT
SET CARDIOPLEGIA MPS 5001102 (MISCELLANEOUS) ×3 IMPLANT
SUT BONE WAX W31G (SUTURE) ×3 IMPLANT
SUT ETHIBON 2 0 V 52N 30 (SUTURE) ×6 IMPLANT
SUT ETHIBOND 2 0 SH (SUTURE)
SUT ETHIBOND 2 0 SH 36X2 (SUTURE) IMPLANT
SUT PROLENE 3 0 RB 1 (SUTURE) ×3 IMPLANT
SUT PROLENE 3 0 SH 48 (SUTURE) IMPLANT
SUT PROLENE 3 0 SH DA (SUTURE) ×3 IMPLANT
SUT PROLENE 3 0 SH1 36 (SUTURE) ×3 IMPLANT
SUT PROLENE 4 0 RB 1 (SUTURE) ×5
SUT PROLENE 4-0 RB1 .5 CRCL 36 (SUTURE) ×10 IMPLANT
SUT STEEL 6MS V (SUTURE) ×3 IMPLANT
SUT STEEL SZ 6 DBL 3X14 BALL (SUTURE) ×3 IMPLANT
SUT VIC AB 1 CTX 18 (SUTURE) ×6 IMPLANT
SYSTEM SAHARA CHEST DRAIN ATS (WOUND CARE) ×3 IMPLANT
TAPE CLOTH SURG 4X10 WHT LF (GAUZE/BANDAGES/DRESSINGS) ×3 IMPLANT
TAPE PAPER 2X10 WHT MICROPORE (GAUZE/BANDAGES/DRESSINGS) ×3 IMPLANT
TOWEL GREEN STERILE (TOWEL DISPOSABLE) ×3 IMPLANT
TOWEL GREEN STERILE FF (TOWEL DISPOSABLE) ×3 IMPLANT
TRAY FOLEY SLVR 16FR TEMP STAT (SET/KITS/TRAYS/PACK) ×3 IMPLANT
TUBE FEEDING 8FR 16IN STR KANG (MISCELLANEOUS) ×3 IMPLANT
UNDERPAD 30X30 (UNDERPADS AND DIAPERS) ×3 IMPLANT
VALVE MAGNA EASE AORTIC 25MM (Prosthesis & Implant Heart) ×3 IMPLANT
VENT LEFT HEART 12002 (CATHETERS) ×3
WATER STERILE IRR 1000ML POUR (IV SOLUTION) ×6 IMPLANT

## 2018-06-08 NOTE — Procedures (Signed)
Extubation Procedure Note  Patient Details:   Name: Kevin Rojas DOB: November 08, 1955 MRN: [id number]   Airway Documentation:  Airway 8 mm (Active)  Secured at (cm) 21 cm 06/08/2018  4:53 PM  Measured From Lips 06/08/2018  4:53 PM  Secured Location Right 06/08/2018  4:53 PM  Secured By Pink Tape 06/08/2018  4:53 PM  Site Condition Dry 06/08/2018  4:53 PM   Vent end date: 06/08/18 Vent end time: 1747   Evaluation  O2 sats: stable throughout Complications: No apparent complications Patient did tolerate procedure well. Bilateral Breath Sounds: Clear   Yes  TEDARIUS CLARO 06/08/2018, 5:45p

## 2018-06-08 NOTE — Progress Notes (Signed)
Patient successfully extubated to 2 L [place] at 1745. Bilateral clear breath sounds. Patient oriented.

## 2018-06-08 NOTE — Transfer of Care (Signed)
Immediate Anesthesia Transfer of Care Note  Patient: Kevin Rojas  Procedure(s) Performed: AORTIC VALVE REPLACEMENT (AVR), ON PUMP, USING MAGNA EASE AORTIC BIOPROSTHESIS VALVE (N/A ) TRANSESOPHAGEAL ECHOCARDIOGRAM (TEE) (N/A )  Patient Location: PACU  Anesthesia Type:General  Level of Consciousness: Patient remains intubated per anesthesia plan  Airway & Oxygen Therapy: Patient remains intubated per anesthesia plan and Patient placed on Ventilator (see vital sign flow sheet for setting)  Post-op Assessment: Report given to RN and Post -op Vital signs reviewed and stable  Post vital signs: Reviewed and stable  Last Vitals:  Vitals Value Taken Time  BP 100/84 06/08/2018  1:23 PM  Temp 35.5 C 06/08/2018  1:33 PM  Pulse 80 06/08/2018  1:33 PM  Resp 12 06/08/2018  1:33 PM  SpO2 100 % 06/08/2018  1:33 PM  Vitals shown include unvalidated device data.  Last Pain:  Vitals:   06/08/18 0606  TempSrc: Oral  PainSc: 0-No pain         Complications: No apparent anesthesia complications

## 2018-06-08 NOTE — Discharge Summary (Signed)
[address]       [place] [address]             [phone]      Physician Discharge Summary  Patient ID: Kevin Rojas MRN: [id number] DOB/AGE: [birth year] 63 y.o.  Admit date: 06/08/2018 Discharge date: 06/13/2018  Admission Diagnoses:   Patient Active Problem List   Diagnosis Date Noted   Dyspepsia --felt to be due to dysmotility 04/10/2018   Aortic regurgitation due to bicuspid aortic valve 04/10/2018   Cognitive disorder    Constipation 04/07/2018   Debility 04/05/2018   Acute pulmonary embolism (HCC) 04/05/2018   Acute respiratory failure with hypoxia (HCC)    Aspiration into airway    Severe aortic insufficiency 03/26/2018   Cardiac arrest (HCC) 03/24/2018   LBBB (left bundle branch block) 03/24/2018   Elevated troponin 03/24/2018   Hypokalemia 03/24/2018   Endotracheally intubated 03/24/2018   Abnormal CXR 03/24/2018    Discharge Diagnoses:  Active Problems:   S/P AVR (aortic valve replacement)   Discharged Condition: good  HPI:   [name] a 63 y.o.malewith history of recent out of hospital ventricular fibrillation cardiac arrest March 24, 2018 followed by prolonged hospitalization. The patient had a witnessed cardiac arrest and his wife performed bystander CPR until EMS arrived, promptly defibrillating the patient and resuscitating him. He underwent therapeutic hypothermia per protocol. During his hospitalization, he was noted to have mild LV systolic dysfunction with dilatation of the left ventricle and severe aortic valve insufficiency. He is found to have a bicuspid aortic valve and dilated ascending aorta 4.5 cm by cta    Cardiac catheterization demonstrated widely patent coronary arteries and normal hemodynamics. The patient slowly improved with respect to his anoxic encephalopathy and ultimately was discharged home on April 13, 2018 after inpatient rehab.   At the time of the patient's admission  initially he had a CT scan without evidence of dissection, subsequent follow-up CT with contrast showed evidence of recent pulmonary emboli, as a result the patient was discharged home on Xarelto.  Since discharge he has his overall physical activity, has had no signs symptoms of covert infection, and has had no overt symptoms of congestive heart failure.  He has seen Dr. Ladona Ridgel in regards to postop placement of pacemaker and/or defibrillator, he does have a left bundle branch block.   Hospital Course:  As dictated by Jari Favre: This patient is a 63 year old male who underwent a aortic valve replacement with Dr. Tyrone Sage on 06/08/2018.  He tolerated the procedure well and was transferred to the cardiac ICU for continued care.  He was extubated time manner.  Postop day 1 we discontinued his chest tubes and lines.  He did have some expected acute blood loss anemia.  We initiated a diuretic regimen for fluid overload.  We started him on IV amiodarone postoperatively for transient atrial fibrillation during the procedure.  He was on Lovenox every 12 hours for his history of pulmonary embolism.  We will resume his Xarelto on discharge.  Dr. Ladona Ridgel was consulted for his history of V. fib arrest initially back in March.  He did have a history of a left bundle branch block and intraoperative atrial fibrillation.  Postop day 2 he continued to progress he did have some acute kidney injury with creatinine of 1.72.  We avoided nephrotoxic agents. POD 3 we continued a diuretic regimen for fluid overload.  His renal function had returned to baseline.  He continued to  be seen by EP and will be discharged home with a LifeVest in place with good follow-up with that service.  He will be transferred to the stepdown unit for continued care today.  Addendum: Patient has been ambulating on room air. He remains in SR, first degree heart block. Because his QTc is still prolonged, Amiodarone will be decreased to 200 mg daily  (had intra op a fib). He is not on an ACE/ARB secondary to slightly elevated creatinine 1.34 (although slightly decreased from 06/04) and somewhat labile BP. Epicardial pacing wires were removed on 06/05. Chest tube sutures will be removed upon prior to discharge. He is tolerating a diet. As previously stated, he has a life vest from previous admission. He will see EP in follow up and they will determine the timing of ICD. He is felt surgically stable for discharge today, provided he has a bowel movement.  Consults: None  Significant Diagnostic Studies:   CLINICAL DATA:  Aortic valve replacement  EXAM: PORTABLE CHEST 1 VIEW  COMPARISON:  06/03/2018  FINDINGS: Postsurgical changes are seen. Endotracheal tube and gastric catheter are noted in satisfactory position. Swan-Ganz catheter is noted in the pulmonary outflow tract. Changes consistent with aortic valve replacement are noted. Mediastinal and pericardial drains are seen. No pneumothorax is noted.  IMPRESSION: Postsurgical change.  Tubes and lines as described.   Electronically Signed   By: Alcide Clever M.D.   On: 06/08/2018 13:54  Treatments:    NAME: BRANDONN, CAPELLI MEDICAL RECORD [id number] ACCOUNT [id number] DATE OF BIRTH:[birth year] FACILITY: [facility] LOCATION: [facility] PHYSICIAN:EDWARD Bari Edward, MD  OPERATIVE REPORT  DATE OF PROCEDURE:  06/08/2018  PREOPERATIVE DIAGNOSES:  Severe aortic insufficiency with history of ventricular fibrillation arrest at home.  POSTOPERATIVE DIAGNOSES:  Severe aortic insufficiency with history of ventricular fibrillation arrest at home.  SURGICAL PROCEDURE:  Aortic valve replacement with a pericardial tissue valve, [employer], model 3300 TFX, 25 mm, serial [id number].  SURGEON:  Sheliah Plane, MD  FIRST ASSISTANT:  Jari Favre, [place]  Discharge Exam: Blood pressure 114/90, pulse 91, temperature 98 F (36.7 C), temperature source Oral, resp. rate 20,  height  (1.753 m), weight 66.8 kg, SpO2 100 %.   Cardiovascular: RRR, no murmur Pulmonary: Slightly diminished bibasilar breath sounds Abdomen: Soft, non tender, bowel sounds present. Extremities: Trace lower extremity edema. Wounds: Clean and dry.  No erythema or signs of infection.   Disposition: Stable and discharged to home  Discharge Instructions    Amb Referral to Cardiac Rehabilitation   Complete by:  As directed    Diagnosis:  Valve Replacement   Valve:  Aortic   After initial evaluation and assessments completed: Virtual Based Care may be provided alone or in conjunction with Phase 2 Cardiac Rehab based on patient barriers.:  Yes     Allergies as of 06/13/2018   No Known Allergies     Medication List    TAKE these medications   acetaminophen 500 MG tablet Commonly known as:  TYLENOL Take 500 mg by mouth every 6 (six) hours as needed for moderate pain or headache. What changed:  Another medication with the same name was removed. Continue taking this medication, and follow the directions you see here.   amiodarone 200 MG tablet Commonly known as:  PACERONE Take 1 tablet (200 mg total) by mouth daily. Start taking on:  June 19, 2018   aspirin EC 81 MG tablet Take 1 tablet (81 mg total) by mouth daily. Start taking on:  June 14, 2018   furosemide 40 MG tablet Commonly known as:  LASIX Take 1 tablet (40 mg total) by mouth daily. For 4 days then stop Start taking on:  June 14, 2018   metoCLOPramide 5 MG tablet Commonly known as:  REGLAN Take 1 tablet (5 mg total) by mouth 4 (four) times daily -  before meals and at bedtime.   metoprolol tartrate 25 MG tablet Commonly known as:  LOPRESSOR Take 0.5 tablets (12.5 mg total) by mouth 2 (two) times daily.   oxyCODONE 5 MG immediate release tablet Commonly known as:  Oxy IR/ROXICODONE Take 1 tablet (5 mg total) by mouth every 4 (four) hours as needed for severe pain.   pantoprazole 40 MG tablet Commonly known  as:  PROTONIX Take 1 tablet (40 mg total) by mouth daily.   potassium chloride SA 20 MEQ tablet Commonly known as:  K-DUR Take 1 tablet (20 mEq total) by mouth daily. For 4 days then stop. Start taking on:  June 14, 2018   rivaroxaban 20 MG Tabs tablet Commonly known as:  XARELTO Take 1 tablet (20 mg total) by mouth daily with supper.   senna-docusate 8.6-50 MG tablet Commonly known as:  Senokot-S Take 1 tablet by mouth at bedtime as needed for mild constipation.      Follow-up Information    Delight Ovens, MD Follow up.   Specialty:  Cardiothoracic Surgery Why:  Your routine follow-up appointment is on 07/09/2018 at 1:30pm. Please arrive at 1pm for a chest xray located at [facility] which is on the first floor of our building.  Contact information: [address] [place] [address] [phone]        Marinus Maw, MD Follow up.   Specialty:  Cardiology Why:  6/23/20290 @ 8:30AM, this is scheduled as an in-clinic visit Contact information: [address] [place] [address] [phone]        Tereso Newcomer T, PA-C Follow up.   Specialties:  Cardiology, Physician Assistant Why:    Tereso Newcomer, PA-C 6/17 :45 am (tele health visit)    Contact information: [address] [place] [address] [phone]          The patient has been discharged on:   1.Beta Blocker:  Yes [ yes  ]                              No   [   ]                              If No, reason:  2.Ace Inhibitor/ARB: Yes [   ]                                     No  [  no  ]                                     If No, reason: Slightly elevated creatinine  3.Statin:   Yes [   ]                  No  [ no  ]  If No, reason: no coronary disease  4.Marlowe KaysValentino Hue  [ yes  ]                  No   [   ]                  If No, reason:    Signed: Ardelle Balls PA_C 06/13/2018, 11:21 AM

## 2018-06-08 NOTE — Anesthesia Procedure Notes (Signed)
Arterial Line Insertion Start/End6/01/2018 7:15 AM Performed by: Rosalio Macadamia, CRNA, CRNA  Patient location: Pre-op. Preanesthetic checklist: patient identified, IV checked, site marked, risks and benefits discussed, surgical consent, monitors and equipment checked, pre-op evaluation, timeout performed and anesthesia consent Right, radial was placed Catheter size: 20 G Hand hygiene performed  and maximum sterile barriers used   Attempts: 2 Procedure performed without using ultrasound guided technique. Following insertion, Biopatch and dressing applied. Post procedure assessment: normal  Patient tolerated the procedure well with no immediate complications.

## 2018-06-08 NOTE — H&P (Signed)
[address]       [place] [id number]             [phone]                    Kevin LeedsMichael L Holmes [facility] Record #[id number] Date of Birth: 07/01/1955 Referring: Kevin Rojas, Candace, MD Primary Care: Kevin Rojas, Candace, MD Primary Cardiologist: Kevin BollmanMichael Cooper, MD    History of Present Illness:    Kevin ChancyMichael Rojas is a 63 y.o. male with history of recent out of hospital ventricular fibrillation cardiac arrest March 24, 2018 followed by prolonged hospitalization.  The patient had a witnessed cardiac arrest and his wife performed bystander CPR until EMS arrived, promptly defibrillating the patient and resuscitating him.  He underwent therapeutic hypothermia per protocol.  During his hospitalization, he was noted to have mild LV systolic dysfunction with dilatation of the left ventricle and severe aortic valve insufficiency.  He is found to have a bicuspid aortic valve and dilated ascending aorta 4.5 cm by cta    Cardiac catheterization demonstrated widely patent coronary arteries and normal hemodynamics.  The patient slowly improved with respect to his anoxic encephalopathy and ultimately was discharged home on April 13, 2018 after inpatient rehab.   At the time of the patient's admission initially he had a CT scan without evidence of dissection, subsequent follow-up CT with contrast showed evidence of recent pulmonary emboli, as a result the patient was discharged home on Xarelto.  Since discharge he has his overall physical activity, has had no signs symptoms of covert infection, and has had no overt symptoms of congestive heart failure.  He has seen Dr. Ladona Rojas in regards to postop placement of pacemaker and/or defibrillator, he does have a left bundle branch block.  Current Activity/ Functional Status:  Patient is independent with mobility/ambulation, transfers, ADL's, IADL's.   Zubrod Score: At the time of surgery this patients most appropriate activity  status/level should be described as: []     0    Normal activity, no symptoms [x]     1    Restricted in physical strenuous activity but ambulatory, able to do out light work []     2    Ambulatory and capable of self care, unable to do work activities, up and about               >50 % of waking hours                              []     3    Only limited self care, in bed greater than 50% of waking hours []     4    Completely disabled, no self care, confined to bed or chair []     5    Moribund   Past Medical History:  Diagnosis Date   Aortic insufficiency    Cardiac arrest (HCC) 03/24/2018   Dysrhythmia    History of kidney stones    Pneumonia    history of   Pulmonary embolism (HCC)    04/04/18    Past Surgical History:  Procedure Laterality Date   AORTIC ARCH ANGIOGRAPHY N/A 04/02/2018   Procedure: AORTIC ARCH ANGIOGRAPHY;  Surgeon: Lennette BihariKelly, Thomas A, MD;  Location: [facility] INVASIVE CV LAB;  Service: Cardiovascular;  Laterality: N/A;   RIGHT/LEFT HEART CATH AND CORONARY ANGIOGRAPHY N/A 04/02/2018   Procedure: RIGHT/LEFT HEART CATH AND CORONARY ANGIOGRAPHY;  Surgeon: Lennette Bihari, MD;  Location: [facility] INVASIVE CV LAB;  Service: Cardiovascular;  Laterality: N/A;    Family History  Problem Relation Age of Onset   Pulmonary embolism Mother    Prostate cancer Father        passed awary at 9     Social History   Tobacco Use  Smoking Status Never Smoker  Smokeless Tobacco Never Used    Social History   Substance and Sexual Activity  Alcohol Use Never   Frequency: Never     No Known Allergies  Current Facility-Administered Medications  Medication Dose Route Frequency Provider Last Rate Last Dose   cefUROXime (ZINACEF) 1.5 g in sodium chloride 0.9 % 100 mL IVPB  1.5 g Intravenous To OR Delight Ovens, MD       cefUROXime (ZINACEF) 750 mg in sodium chloride 0.9 % 100 mL IVPB  750 mg Intravenous To OR Delight Ovens, MD       chlorhexidine (HIBICLENS) 4 %  liquid 2 application  30 mL Topical UD Delight Ovens, MD       chlorhexidine (PERIDEX) 0.12 % solution            dexmedetomidine (PRECEDEX) 400 MCG/100ML (4 mcg/mL) infusion  0.1-0.7 mcg/kg/hr Intravenous To OR Delight Ovens, MD       DOPamine (INTROPIN) 800 mg in dextrose 5 % 250 mL (3.2 mg/mL) infusion  0-10 mcg/kg/min Intravenous To OR Delight Ovens, MD       EPINEPHrine (ADRENALIN) 4 mg in dextrose 5 % 250 mL (0.016 mg/mL) infusion  0-10 mcg/min Intravenous To OR Delight Ovens, MD       heparin 2,500 Units, papaverine 30 mg in electrolyte-148 (PLASMALYTE-148) 500 mL irrigation   Irrigation To OR Delight Ovens, MD       heparin 30,000 units/NS 1000 mL solution for CELLSAVER   Other To OR Delight Ovens, MD       insulin regular, human (MYXREDLIN) 100 units/ 100 mL infusion   Intravenous To OR Delight Ovens, MD       magnesium sulfate (IV Push/IM) injection 40 mEq  40 mEq Other To OR Delight Ovens, MD       metoprolol tartrate (LOPRESSOR) tablet 12.5 mg  12.5 mg Oral Once Delight Ovens, MD       milrinone (PRIMACOR) 20 MG/100 ML (0.2 mg/mL) infusion  0.3 mcg/kg/min Intravenous To OR Delight Ovens, MD       nitroGLYCERIN 50 mg in dextrose 5 % 250 mL (0.2 mg/mL) infusion  2-200 mcg/min Intravenous To OR Delight Ovens, MD       norepinephrine (LEVOPHED)  in premix infusion  0-40 mcg/min Intravenous To OR Delight Ovens, MD       phenylephrine (NEOSYNEPHRINE) 20-0.9 MG/250ML-% infusion  30-200 mcg/min Intravenous To OR Delight Ovens, MD       potassium chloride injection 80 mEq  80 mEq Other To OR Delight Ovens, MD       tranexamic acid (CYKLOKAPRON) 2,500 mg in sodium chloride 0.9 % 250 mL (10 mg/mL) infusion  1.5 mg/kg/hr Intravenous To OR Delight Ovens, MD       tranexamic acid (CYKLOKAPRON) bolus via infusion - over 30 minutes 1,050 mg  15 mg/kg Intravenous To OR Delight Ovens, MD        tranexamic acid (CYKLOKAPRON) pump prime solution 140 mg  2 mg/kg Intracatheter To OR Delight Ovens,  MD       vancomycin (VANCOCIN) 1,250 mg in sodium chloride 0.9 % 250 mL IVPB  1,250 mg Intravenous To OR Delight Ovens, MD        Pertinent items are noted in HPI.   Review of Systems:     Cardiac Review of Systems: [Y] = yes  or   [ N ] = no   Chest Pain [ n   ]  Resting SOB [  n ] Exertional SOB  Cove.Etienne  ]  Orthopnea [ n ]   Pedal Edema [ n  ]    Palpitations [n  ] Syncope  [cardaic arrest  ]   Presyncope [n   ]   General Review of Systems: [Y] = yes [  ]=no Constitional: recent weight change [  ];  Wt loss over the last 3 months [   ] anorexia [  ]; fatigue [  ]; nausea [  ]; night sweats [  ]; fever [  ]; or chills [  ];           Eye : blurred vision [  ]; diplopia [   ]; vision changes [  ];  Amaurosis fugax[  ]; Resp: cough [  ];  wheezing[  ];  hemoptysis[  ]; shortness of breath[  ]; paroxysmal nocturnal dyspnea[  ]; dyspnea on exertion[  ]; or orthopnea[  ];  GI:  gallstones[  ], vomiting[  ];  dysphagia[  ]; melena[  ];  hematochezia [  ]; heartburn[  ];   Hx of  Colonoscopy[  ]; GU: kidney stones [  ]; hematuria[  ];   dysuria [  ];  nocturia[  ];  history of     obstruction [  ]; urinary frequency [  ]             Skin: rash, swelling[  ];, hair loss[  ];  peripheral edema[  ];  or itching[  ]; Musculosketetal: myalgias[  ];  joint swelling[  ];  joint erythema[  ];  joint pain[  ];  back pain[  ];  Heme/Lymph: bruising[  ];  bleeding[  ];  anemia[  ];  Neuro: TIA[  ];  headaches[  ];  stroke[  ];  vertigo[  ];  seizures[  ];   paresthesias[  ];  difficulty walking[  ];  Psych:depression[  ]; anxiety[  ];  Endocrine: diabetes[  ];  thyroid dysfunction[  ];  Immunizations: Flu up to date Cove.Etienne  ]; Pneumococcal up to date Cove.Etienne  ];  Other:     PHYSICAL EXAMINATION: BP (!) 162/74    Pulse (!) 52    Temp 98.5 F (36.9 C) (Oral)    Resp 18    Ht  (1.753 m)    Wt 70 kg     SpO2 99%    BMI 22.80 kg/m  General appearance: alert, cooperative and no distress Head: Normocephalic, without obvious abnormality, atraumatic Neck: no adenopathy, no carotid bruit, no JVD, supple, symmetrical, trachea midline and thyroid not enlarged, symmetric, no tenderness/mass/nodules Lymph nodes: Cervical, supraclavicular, and axillary nodes normal. Resp: clear to auscultation bilaterally Cardio: diastolic murmur: mid diastolic 3/6, crescendo at 2nd left intercostal space, at apex GI: soft, non-tender; bowel sounds normal; no masses,  no organomegaly Extremities: extremities normal, atraumatic, no cyanosis or edema and Homans sign is negative, no sign of DVT Neurologic: Grossly normal  Diagnostic Studies & Laboratory data:  Recent Radiology Findings:  Dg Chest 2 View  Result Date: 06/03/2018 CLINICAL DATA:  Preop aortic valve insufficiency EXAM: CHEST - 2 VIEW COMPARISON:  04/13/2018 FINDINGS: Heart and mediastinal contours are within normal limits. No focal opacities or effusions. No acute bony abnormality. IMPRESSION: No active cardiopulmonary disease. Electronically Signed   By: Charlett Nose M.D.   On: 06/03/2018 16:43   CLINICAL DATA:  Cardiac arrest, severe aortic valvular insufficiency and aneurysmal dilatation of the ascending thoracic aorta by prior CTA of the chest timed for pulmonary artery evaluation.  EXAM: CT ANGIOGRAPHY CHEST, ABDOMEN AND PELVIS  TECHNIQUE: Multidetector CT imaging through the chest, abdomen and pelvis was performed using the standard protocol during bolus administration of intravenous contrast. Multiplanar reconstructed images and MIPs were obtained and reviewed to evaluate the vascular anatomy.  CONTRAST:  OMNIPAQUE IOHEXOL 350 MG/ML SOLN  COMPARISON:  CTA of the chest on 03/24/2018  FINDINGS: CTA CHEST FINDINGS  Cardiovascular: The aortic root is mildly dilated and measures approximately 4.1-4.2 cm at the level of the  sinuses of Valsalva. The aortic valve shows mild central calcification. The ascending thoracic aorta is mildly dilated and measures 4.2-4.3 cm in greatest diameter. The proximal arch measures approximately 3.3 cm. The distal arch demonstrates severe tortuosity and focal kinking prior to an area of mild dilatation measuring 3.2 cm. The descending thoracic aorta then tapers to a diameter of 2.8 cm. There is no evidence of aortic dissection. Proximal great vessels demonstrate normal branching anatomy and normal patency without aneurysmal disease.  The heart is mildly enlarged. The left ventricle appears dilated. Trace anterior pericardial fluid without significant pericardial effusion. No significant calcified coronary artery plaque is identified by CT.  Although not optimized for pulmonary arterial evaluation, the pulmonary arteries are quite well opacified on the study and there is a new finding of definitive nonocclusive pulmonary embolism in the right lung with nonocclusive thrombus identified at a bifurcation of the right lower lobe pulmonary artery into segmental branches and within a right middle lobe pulmonary artery branch. No left-sided pulmonary embolism is identified. There is no evidence of central PE or saddle embolism.  Mediastinum/Nodes: No enlarged mediastinal, hilar, or axillary lymph nodes. Thyroid gland, trachea, and esophagus demonstrate no significant findings.  Lungs/Pleura: Mild scarring/atelectasis at both lung bases. There is no evidence of pulmonary edema, consolidation, pneumothorax, nodule or pleural fluid. No evidence of focal pulmonary infarction.  Musculoskeletal: No chest wall abnormality. No acute or significant osseous findings.  Review of the MIP images confirms the above findings.  CTA ABDOMEN AND PELVIS FINDINGS  VASCULAR  Aorta: The abdominal aorta is normally patent and of normal caliber without evidence of aneurysm or  dissection. No significant atherosclerosis.  Celiac: Normally patent.  Normal branch vessel anatomy.  SMA: Normally patent.  Renals: A single right renal artery and 2 separate left renal arteries with small accessory upper pole artery demonstrate normal patency.  IMA: Normally patent.  Inflow: Bilateral iliac arteries demonstrate normal patency. Common femoral arteries and femoral bifurcations are widely patent.  Review of the MIP images confirms the above findings.  NON-VASCULAR  Hepatobiliary: No focal liver abnormality is seen. No gallstones, gallbladder wall thickening, or biliary dilatation.  Pancreas: Unremarkable. No pancreatic ductal dilatation or surrounding inflammatory changes.  Spleen: Normal in size without focal abnormality.  Adrenals/Urinary Tract: Adrenal glands are unremarkable. Kidneys are normal, without renal calculi, focal lesion, or hydronephrosis. Bladder is unremarkable.  Stomach/Bowel: Bowel shows no evidence of obstruction, ileus or inflammation. No  free air identified.  Lymphatic: No enlarged lymph nodes identified.  Reproductive: Prostate is unremarkable.  Other: No hernias identified.  No free fluid or focal abscess.  Musculoskeletal: Mild degenerative disc disease at L4-5 and L5-S1.  Review of the MIP images confirms the above findings.  IMPRESSION: 1. New, acute subsegmental nonocclusive pulmonary embolism in branches of the right lower lobe and right middle lobe pulmonary arteries. Overall volume of thrombus burden is low. This thrombus was not present on the CTA dated 03/24/2018. 2. Mild aneurysmal disease involving the aortic root and ascending thoracic aorta with the root measuring 4.2 cm in greatest diameter and the ascending thoracic aorta measuring 4.3 cm in greatest diameter. Associated partially calcified aortic valve. The aortic arch is very tortuous distally and demonstrates some degree of focal kinking  due to tortuosity without evidence of significant stenosis. No evidence of aortic dissection. 3. Left ventricular cavity dilatation. 4. No evidence of aneurysmal disease or significant obstructive disease involving the abdominal aorta, iliac arteries, common femoral arteries or visceral branches of the abdominal aorta.  These results were called by telephone at the time of interpretation on 04/04/2018 at 12:58 pm to Dr. Noralee Stain, who verbally acknowledged these results.   Electronically Signed   By: Irish Lack M.D.   On: 04/04/2018 13:10  CLINICAL DATA:  Cardiac arrest.  Intubated patient.  EXAM: CT ANGIOGRAPHY CHEST WITH CONTRAST  TECHNIQUE: Multidetector CT imaging of the chest was performed using the standard protocol during bolus administration of intravenous contrast. Multiplanar CT image reconstructions and MIPs were obtained to evaluate the vascular anatomy.  CONTRAST:  27mL OMNIPAQUE IOHEXOL 350 MG/ML SOLN  COMPARISON:  Current chest radiographs.  FINDINGS: Cardiovascular: There is satisfactory opacification of the pulmonary arteries to the segmental level. There is no evidence of a pulmonary embolism.  Heart is mildly enlarged. No pericardial effusion. No coronary artery calcifications. Ascending aorta is dilated to 4.4 cm. Aorta is not opacified. No atherosclerotic calcifications.  Mediastinum/Nodes: No neck base, no neck base or axillary masses or enlarged lymph nodes. Endotracheal tube and nasal/orogastric tube are well positioned. Subcentimeter shotty mediastinal lymph nodes. No discrete enlarged mediastinal or hilar lymph nodes. No masses. Trachea is unremarkable.  Lungs/Pleura: Bilateral dependent lung consolidation. Airspace consolidation is noted in the posterior right upper lobe with a lesser degree of posterior consolidation in the right lower lobe. There is less extensive consolidation in the posterior left upper lobe and left  lower lobe.  No evidence of pulmonary edema. No pleural effusion and no pneumothorax.  Upper Abdomen: No acute abnormality.  Musculoskeletal: No chest wall abnormality. No acute or significant osseous findings.  Review of the MIP images confirms the above findings.  IMPRESSION: 1. No evidence of a pulmonary embolus. 2. Dependent consolidation in both lungs, right greater than left. A significant portion of this is likely due to multifocal pneumonia. Some of this dependent opacity, particularly in the lower lobes, is likely atelectasis. 3. There is no evidence of pulmonary edema.  No pleural effusion. 4. Endotracheal and nasogastric tubes are well position. 5. Mild cardiomegaly. 6. Dilated ascending aorta to 4.2 cm. Recommend annual imaging followup by CTA or MRA. This recommendation follows 2010 ACCF/AHA/AATS/ACR/ASA/SCA/SCAI/SIR/STS/SVM Guidelines for the Diagnosis and Management of Patients with Thoracic Aortic Disease. Circulation. 2010; 121: Y616-O372. Aortic aneurysm NOS (ICD10-I71.9)  Aortic aneurysm NOS (ICD10-I71.9).   Electronically Signed   By: Amie Portland M.D.   On: 03/24/2018 22:44    I have independently reviewed the above radiology studies  and reviewed the findings with the patient.   Recent Lab Findings: Lab Results  Component Value Date   WBC 3.7 (L) 06/03/2018   HGB 13.7 06/03/2018   HCT 43.2 06/03/2018   PLT 189 06/03/2018   GLUCOSE 80 06/03/2018   ALT 16 06/03/2018   AST 15 06/03/2018   NA 138 06/03/2018   K 4.2 06/03/2018   CL 110 06/03/2018   CREATININE 1.17 06/03/2018   BUN 9 06/03/2018   CO2 20 (L) 06/03/2018   TSH 1.863 03/25/2018   INR 1.4 (H) 06/03/2018   HGBA1C 5.0 06/03/2018    CATH: There is mild left ventricular systolic dysfunction.  LV end diastolic pressure is normal.  The left ventricular ejection fraction is 45-50% by visual estimate.  There is severe (4+) aortic regurgitation.   Normal right heart  pressures.  Mild global LV dysfunction with an EF 45 to 50%.  Bicuspid aortic valve with severe 4+ aortic insufficiency, mild aortic stenosis.  Dilated aortic root.  Normal coronary arteries and a left dominant circulation.  RECOMMENDATION: The patient will ultimately need surgical consultation for aortic valve repair/replacement. Right Heart Pressures RA: A-wave 7, V wave 4; mean 4 RV: 27/6 PA: 23/9; mean 15 PW: A-wave 11 V wave 10; mean 9  Initial AO 124/71. Initial LV: 144/9  Pullback: LV: 148/8 Ao: 133/70  Oxygen saturation in the pulmonary artery 74% in the aorta 96%.  By the [facility] method cardiac output 6.5 L/min and cardiac index 3.6 L/min/m.  Mild aortic stenosis with a probable congenital bicuspid valve with a peak to peak gradient of 15 mm Hg, mean gradient of 14 mm Hg and calculated valve area 2.3 cm.  Severe 4+ aortic insufficiency.  EF estimate 45 to 50% as assessed by supravalvular aortography/AR  with LVEDP at 9 mm.    The [employer] of Cardiology [facility]) and the [place] Heart Association [facility]) have issued a statement to clarify 2 previous guidelines from the [facility], AHA, and collaborating societies addressing the risk of aortic dissection in patients with bicuspid aortic valves (BAV) and severe aortic enlargement. The 2 guidelines differ with regard to the recommended threshold of aortic root or ascending aortic dilatation that would justify surgical intervention in patients with bicuspid aortic valves. This new statement of clarification uses the ACC/AHA revised structure for delineating the Class of Recommendation and Level of Evidence to provide recommendation that replace those contained in Section 9.2.2.1 of the thoracic aortic disease guidelines and Section 5.1.3 of the valvular heart disease guideline. New recommendations in intervention in patients with BAV and dilatation of the aortic root (sinuses) or ascending aorta include:   Operative  intervention to repair or replace the aortic root (sinuses) or replace the ascending aorta is indicated in asymptomatic patients with BAV if the diameter of the aortic root or ascending aorta is 5.5 cm or greater. (Class of recommendation 1, Level of evidence B-NR).   Operative intervention to repair or replace the aortic root (sinuses) or replace the ascending aorta is reasonable in asymptomatic patients with BAV if the diameter of the aortic root or ascending aorta is 5.0 cm or greater and an additional risk factor for dissection is present or if the patient is at low surgical risk and the surgery is performed by an experienced aortic surgical team in a center with established expertise in these procedures. (Class of recommendation IIa; Level of Evidence B-NR).   Replacement of the ascending aorta is reasonable in patients with BAV undergoing AVR because  of severe aortic stenosis or aortic regurgitation when the diameter of the ascending aorta is greater than 4.5 cm (Class of recommendation IIa; Level of evidence C-EO).  Citation: Gae Bon, Kristen Loader, et al. Surgery for aortic dilatation in patients with bicuspid aortic valves. A statement of clarification from the [employer] of Cardiology/American Heart Association Task Force on Clinical Practice Guidelines. [Published online ahead of print December 10, 2013]. Circulation. doi: 10.1161/CIR.0000000000000331.    Assessment / Plan: Sever AI with bicuspid aortic valve    Ventricular fibrillation cardiac arrest  Left bundle branch block -the patient has left bundle branch block and left ventricular dysfunction   History of PE on last admission- on Xarelto currently  With the patient's presentation and degree of aortic insufficiency which is severe is been recommended to the patient that we proceed with aortic valve replacement.  At the time will consider a ascending aortic replacement possible root replacement, as he does have a  bicuspid aortic valve.  Patient's wife has a mechanical valve since she was in her teens. The patient  prefers not to have a mechanical valve because of the long-term issues with Coumadin.  I discussed with him in detail and with his wife on the phone the risks and options of plan surgery including possible need for permanent pacemaker and/or AICD as outlined by Dr. Ladona Ridgel risk of bleeding blood transfusion stroke myocardial infarction, death,  pulmonary emboli have all been discussed in detail.   The goals risks and alternatives of the planned surgical procedure Procedure(s): AORTIC VALVE REPLACEMENT (AVR) (N/A) possible ASCENDING AORTIC ROOT REPLACEMENT (N/A) REPLACEMENT ASCENDING AORTA (N/A) TRANSESOPHAGEAL ECHOCARDIOGRAM (TEE) (N/A)  have been discussed with the patient in detail. The risks of the procedure including death, infection, stroke, myocardial infarction, bleeding, blood transfusion, heart block and need for pacemake have all been discussed specifically.  I have quoted Kevin Rojas a 3% of perioperative mortality and a complication rate as high as 50 %. The patient's questions have been answered.Kevin Rojas is willing  to proceed with the planned procedure.  Delight Ovens MD      [address] [place] [place], [address] Office [phone]   Beeper [phone]  06/08/2018 7:05 AM

## 2018-06-08 NOTE — Progress Notes (Signed)
      [address]       [place] [address]             [phone]      Resting comfortably, extubated earlier  BP 98/77   Pulse 80   Temp 99 F (37.2 C)   Resp (!) 21   Ht 5\' 9"  (1.753 m)   Wt 70 kg   SpO2 100%   BMI 22.80 kg/m  PA 25/10 CI= 2.8 Minimal CT output Hct= 34, K= 4.2 Doing well early postop  Millenia Waldvogel C. Dorris Fetch, MD [facility] [phone]

## 2018-06-08 NOTE — Brief Op Note (Signed)
      [address]       [place] [address]             [phone]     06/08/2018  1:30 PM  PATIENT:  Kevin Rojas  63 y.o. male  PRE-OPERATIVE DIAGNOSIS:  AI  POST-OPERATIVE DIAGNOSIS:  AI  PROCEDURE:  Procedure(s): AORTIC VALVE REPLACEMENT (AVR), ON PUMP, USING MAGNA EASE AORTIC BIOPROSTHESIS VALVE (N/A) TRANSESOPHAGEAL ECHOCARDIOGRAM (TEE) (N/A)  SURGEON:  Surgeon(s) and Role:    * Delight Ovens, MD - Primary  PHYSICIAN ASSISTANT:  Jari Favre, PA-C   ANESTHESIA:   general  EBL:  650 mL   BLOOD ADMINISTERED:none  DRAINS: ROUTINE   LOCAL MEDICATIONS USED:  NONE  SPECIMEN:  Source of Specimen:  AORTIC VALVE LEAFLETS  DISPOSITION OF SPECIMEN:  PATHOLOGY  COUNTS:  YES  DICTATION: .Dragon Dictation  PLAN OF CARE: Admit to inpatient   PATIENT DISPOSITION:  ICU - intubated and hemodynamically stable.   Delay start of Pharmacological VTE agent (>24hrs) due to surgical blood loss or risk of bleeding: yes

## 2018-06-08 NOTE — Anesthesia Procedure Notes (Signed)
Procedure Name: Intubation Date/Time: 06/08/2018 8:12 AM Performed by: Mayer Camel, CRNA Pre-anesthesia Checklist: Patient identified, Emergency Drugs available, Suction available and Patient being monitored Patient Re-evaluated:Patient Re-evaluated prior to induction Oxygen Delivery Method: Circle System Utilized Preoxygenation: Pre-oxygenation with 100% oxygen Induction Type: IV induction and Rapid sequence Laryngoscope Size: Miller and 2 Grade View: Grade I Tube type: Oral Tube size: 8.0 mm Number of attempts: 1 Airway Equipment and Method: Stylet and Oral airway Placement Confirmation: ETT inserted through vocal cords under direct vision,  positive ETCO2 and breath sounds checked- equal and bilateral Secured at: 22 cm Tube secured with: Tape Dental Injury: Teeth and Oropharynx as per pre-operative assessment

## 2018-06-08 NOTE — Progress Notes (Signed)
Assisted camera/video time with family via elink  

## 2018-06-08 NOTE — Anesthesia Procedure Notes (Signed)
Central Venous Catheter Insertion Performed by: Val Eagle, MD, anesthesiologist Start/End6/01/2018 6:22 AM, 06/08/2018 6:30 AM Patient location: Pre-op. Preanesthetic checklist: patient identified, IV checked, site marked, risks and benefits discussed, surgical consent, monitors and equipment checked, pre-op evaluation, timeout performed and anesthesia consent Hand hygiene performed  and maximum sterile barriers used  PA cath was placed.Swan type:thermodilution Procedure performed without using ultrasound guided technique. Attempts: 1 Patient tolerated the procedure well with no immediate complications.

## 2018-06-08 NOTE — Anesthesia Procedure Notes (Signed)
Central Venous Catheter Insertion Performed by: Oleta Mouse, MD, anesthesiologist Start/End6/01/2018 6:22 AM, 06/08/2018 6:30 AM Patient location: Pre-op. Preanesthetic checklist: patient identified, IV checked, site marked, risks and benefits discussed, surgical consent, monitors and equipment checked, pre-op evaluation, timeout performed and anesthesia consent Position: Trendelenburg Lidocaine 1% used for infiltration and patient sedated Hand hygiene performed  and maximum sterile barriers used  Catheter size: 9 Fr Total catheter length 10. MAC introducer Procedure performed using ultrasound guided technique. Ultrasound Notes:anatomy identified, needle tip was noted to be adjacent to the nerve/plexus identified, no ultrasound evidence of intravascular and/or intraneural injection and image(s) printed for medical record Attempts: 1 Following insertion, line sutured, dressing applied and Biopatch. Post procedure assessment: blood return through all ports, free fluid flow and no air  Patient tolerated the procedure well with no immediate complications.

## 2018-06-08 NOTE — Progress Notes (Signed)
Wife [name] updated about patient's status since surgery. Password placed in Epic.

## 2018-06-08 NOTE — Anesthesia Procedure Notes (Signed)
Arterial Line Insertion Start/End6/01/2018 8:30 AM Performed by: Dorris Singh, MD, anesthesiologist  Preanesthetic checklist: patient identified, IV checked, site marked, risks and benefits discussed, surgical consent, monitors and equipment checked, pre-op evaluation, timeout performed and anesthesia consent Left, brachial was placed Catheter size: 20 G Hand hygiene performed  and maximum sterile barriers used   Attempts: 1 Procedure performed using ultrasound guided technique. Ultrasound Notes:anatomy identified and no ultrasound evidence of intravascular and/or intraneural injection Following insertion, line sutured, dressing applied and Biopatch. Post procedure assessment: normal  Patient tolerated the procedure well with no immediate complications.

## 2018-06-09 ENCOUNTER — Encounter (HOSPITAL_COMMUNITY): Payer: Self-pay | Admitting: Cardiothoracic Surgery

## 2018-06-09 ENCOUNTER — Inpatient Hospital Stay (HOSPITAL_COMMUNITY): Payer: BC Managed Care – PPO

## 2018-06-09 DIAGNOSIS — I4901 Ventricular fibrillation: Secondary | ICD-10-CM

## 2018-06-09 LAB — BASIC METABOLIC PANEL
Anion gap: 9 (ref 5–15)
Anion gap: 9 (ref 5–15)
BUN: 9 mg/dL (ref 8–23)
BUN: 16 mg/dL (ref 8–23)
CO2: 22 mmol/L (ref 22–32)
CO2: 23 mmol/L (ref 22–32)
Calcium: 8.3 mg/dL — ABNORMAL LOW (ref 8.9–10.3)
Calcium: 8.6 mg/dL — ABNORMAL LOW (ref 8.9–10.3)
Chloride: 102 mmol/L (ref 98–111)
Chloride: 100 mmol/L (ref 98–111)
Creatinine, Ser: 1.13 mg/dL (ref 0.61–1.24)
Creatinine, Ser: 1.65 mg/dL — ABNORMAL HIGH (ref 0.61–1.24)
GFR calc Af Amer: >60 mL/min (ref >60)
GFR calc Af Amer: 51 mL/min — ABNORMAL LOW (ref >60)
GFR calc non Af Amer: >60 mL/min (ref >60)
GFR calc non Af Amer: 44 mL/min — ABNORMAL LOW (ref >60)
Glucose, Bld: 146 mg/dL — ABNORMAL HIGH (ref 70–99)
Glucose, Bld: 169 mg/dL — ABNORMAL HIGH (ref 70–99)
Potassium: 4.5 mmol/L (ref 3.5–5.1)
Potassium: 4.4 mmol/L (ref 3.5–5.1)
Sodium: 133 mmol/L — ABNORMAL LOW (ref 135–145)
Sodium: 132 mmol/L — ABNORMAL LOW (ref 135–145)

## 2018-06-09 LAB — MAGNESIUM
Magnesium: 2.3 mg/dL (ref 1.7–2.4)
Magnesium: 2.6 mg/dL — ABNORMAL HIGH (ref 1.7–2.4)

## 2018-06-09 LAB — GLUCOSE, CAPILLARY
Glucose-Capillary: 147 mg/dL — ABNORMAL HIGH (ref 70–99)
Glucose-Capillary: 156 mg/dL — ABNORMAL HIGH (ref 70–99)
Glucose-Capillary: 131 mg/dL — ABNORMAL HIGH (ref 70–99)
Glucose-Capillary: 145 mg/dL — ABNORMAL HIGH (ref 70–99)
Glucose-Capillary: 130 mg/dL — ABNORMAL HIGH (ref 70–99)

## 2018-06-09 LAB — CBC
HCT: 35.1 % — ABNORMAL LOW (ref 39.0–52.0)
HCT: 36.9 % — ABNORMAL LOW (ref 39.0–52.0)
Hemoglobin: 11.5 g/dL — ABNORMAL LOW (ref 13.0–17.0)
Hemoglobin: 12 g/dL — ABNORMAL LOW (ref 13.0–17.0)
MCH: 27.6 pg (ref 26.0–34.0)
MCH: 27.7 pg (ref 26.0–34.0)
MCHC: 32.8 g/dL (ref 30.0–36.0)
MCHC: 32.5 g/dL (ref 30.0–36.0)
MCV: 84.2 fL (ref 80.0–100.0)
MCV: 85.2 fL (ref 80.0–100.0)
Platelets: 136 10*3/uL — ABNORMAL LOW (ref 150–400)
Platelets: 145 10*3/uL — ABNORMAL LOW (ref 150–400)
RBC: 4.17 MIL/uL — ABNORMAL LOW (ref 4.22–5.81)
RBC: 4.33 MIL/uL (ref 4.22–5.81)
RDW: 13.5 % (ref 11.5–15.5)
RDW: 14 % (ref 11.5–15.5)
WBC: 15.1 10*3/uL — ABNORMAL HIGH (ref 4.0–10.5)
WBC: 16.4 10*3/uL — ABNORMAL HIGH (ref 4.0–10.5)
nRBC: 0 % (ref 0.0–0.2)
nRBC: 0 % (ref 0.0–0.2)

## 2018-06-09 MED ORDER — INSULIN ASPART 100 UNIT/ML ~~LOC~~ SOLN: 0.0000 [IU] | SUBCUTANEOUS | Status: DC

## 2018-06-09 MED ORDER — ENOXAPARIN SODIUM 40 MG/0.4ML ~~LOC~~ SOLN
40.0000 mg | Freq: Two times a day (BID) | SUBCUTANEOUS | Status: DC
  Administered 2018-06-09 (×2): 40 mg via SUBCUTANEOUS
  Filled 2018-06-09 (×2): qty 0.4

## 2018-06-09 MED ORDER — AMIODARONE HCL 200 MG PO TABS
200.0000 mg | ORAL_TABLET | Freq: Two times a day (BID) | ORAL | Status: DC
  Administered 2018-06-09 – 2018-06-10 (×3): 200 mg via ORAL
  Filled 2018-06-09 (×3): qty 1

## 2018-06-09 MED ORDER — INSULIN ASPART 100 UNIT/ML ~~LOC~~ SOLN
0.0000 [IU] | SUBCUTANEOUS | Status: DC
  Administered 2018-06-09 – 2018-06-10 (×5): 2 [IU] via SUBCUTANEOUS

## 2018-06-09 NOTE — Progress Notes (Signed)
Assisted tele visit to patient with wife.  Furious Chiarelli P, RN  

## 2018-06-09 NOTE — Progress Notes (Addendum)
Patient ID: Kevin Rojas, male   DOB: 1955/05/23, 63 y.o.   MRN: [id number] [facility] DAILY ICU PROGRESS NOTE                   [address]            [place] [address]          [phone]   1 Day Post-Op Procedure(s) (LRB): AORTIC VALVE REPLACEMENT (AVR), ON PUMP, USING MAGNA EASE AORTIC BIOPROSTHESIS VALVE (N/A) TRANSESOPHAGEAL ECHOCARDIOGRAM (TEE) (N/A)  Total Length of Stay:  LOS: 1 day   Subjective: Patient awake alert neurologically intact, relatively comfortable as far as postop pain  Objective: Vital signs in last 24 hours: Temp:  [95.7 F (35.4 C)-99.7 F (37.6 C)] 98.6 F (37 C) (06/02 0700) Pulse Rate:  [59-80] 80 (06/02 0700) Cardiac Rhythm: A-V Sequential paced (06/02 0400) Resp:  [12-27] 24 (06/02 0700) BP: (98-123)/(68-99) 103/87 (06/02 0700) SpO2:  [94 %-100 %] 97 % (06/02 0700) Arterial Line BP: (91-134)/(65-88) 109/72 (06/02 0700) FiO2 (%):  [40 %-50 %] 40 % (06/01 1719) Weight:  [68.8 kg] 68.8 kg (06/02 0500)  Filed Weights   06/08/18 0606 06/09/18 0500  Weight: 70 kg 68.8 kg    Weight change: -1.235 kg   Hemodynamic parameters for last 24 hours: PAP: (13-26)/(6-12) 18/8 CO:  [3.6 L/min-5.5 L/min] 4.5 L/min CI:  [1.9 L/min/m2-3 L/min/m2] 2.4 L/min/m2  Intake/Output from previous day: 06/01 0701 - 06/02 0700 In: 5175.8 [I.V.:3250.6; Blood:350; NG/GT:30; IV Piggyback:1545.1] Out: 4580 [DXIPJ:8250; Emesis/NG output:10; Blood:650; Chest Tube:420]  Intake/Output this shift: No intake/output data recorded.  Current Meds: Scheduled Meds: . acetaminophen  1,000 mg Oral Q6H   Or  . acetaminophen (TYLENOL) oral liquid 160 mg/5 mL  1,000 mg Per Tube Q6H  . aspirin EC  325 mg Oral Daily   Or  . aspirin  324 mg Per Tube Daily  . bisacodyl  10 mg Oral Daily   Or  . bisacodyl  10 mg Rectal Daily  . Chlorhexidine Gluconate Cloth  6 each Topical Daily  . docusate sodium  200 mg Oral Daily  . enoxaparin (LOVENOX) injection  30  mg Subcutaneous Q24H  . insulin aspart  0-24 Units Subcutaneous Q4H  . mouth rinse  15 mL Mouth Rinse BID  . metoCLOPramide (REGLAN) injection  10 mg Intravenous Q6H  . metoprolol tartrate  12.5 mg Oral BID   Or  . metoprolol tartrate  12.5 mg Per Tube BID  . [START ON 06/10/2018] pantoprazole  40 mg Oral Daily  . sodium chloride flush  10-40 mL Intracatheter Q12H  . sodium chloride flush  3 mL Intravenous Q12H   Continuous Infusions: . sodium chloride Stopped (06/08/18 1859)  . sodium chloride    . sodium chloride    . albumin human 12.5 g (06/08/18 1533)  . amiodarone 30 mg/hr (06/09/18 0700)  . cefUROXime (ZINACEF)  IV Stopped (06/09/18 0425)  . dexmedetomidine (PRECEDEX) IV infusion Stopped (06/08/18 1629)  . DOPamine 2 mcg/kg/min (06/09/18 0700)  . lactated ringers    . lactated ringers 10 mL/hr at 06/09/18 0700  . lactated ringers    . milrinone 0.125 mcg/kg/min (06/09/18 0700)  . nitroGLYCERIN 30 mcg/min (06/09/18 0500)  . phenylephrine (NEO-SYNEPHRINE) Adult infusion Stopped (06/08/18 1805)   PRN Meds:.sodium chloride, albumin human, lactated ringers, metoprolol tartrate, morphine injection, ondansetron (ZOFRAN) IV, oxyCODONE, sodium chloride flush, sodium chloride flush, traMADol  General appearance: alert, cooperative and no distress Neurologic: intact  Heart: Atrially paced at 80 Lungs: diminished breath sounds bibasilar Abdomen: soft, non-tender; bowel sounds normal; no masses,  no organomegaly Extremities: extremities normal, atraumatic, no cyanosis or edema and Homans sign is negative, no sign of DVT Wound: Sternum stable  Lab Results: CBC: Recent Labs    06/08/18 1833  06/08/18 1841 06/09/18 0412  WBC 12.9*  --   --  15.1*  HGB 11.3*   < > 11.6* 11.5*  HCT 34.6*   < > 34.0* 35.1*  PLT 128*  --   --  136*   < > = values in this interval not displayed.   BMET:  Recent Labs    06/08/18 1836 06/08/18 1841 06/09/18 0412  NA 137 138 133*  K 4.2 4.2 4.5   CL 105  --  102  CO2  --   --  22  GLUCOSE 131*  --  146*  BUN 10  --  9  CREATININE 0.90  --  1.13  CALCIUM  --   --  8.3*    CMET: Lab Results  Component Value Date   WBC 15.1 (H) 06/09/2018   HGB 11.5 (L) 06/09/2018   HCT 35.1 (L) 06/09/2018   PLT 136 (L) 06/09/2018   GLUCOSE 146 (H) 06/09/2018   ALT 16 06/03/2018   AST 15 06/03/2018   NA 133 (L) 06/09/2018   K 4.5 06/09/2018   CL 102 06/09/2018   CREATININE 1.13 06/09/2018   BUN 9 06/09/2018   CO2 22 06/09/2018   TSH 1.863 03/25/2018   INR 1.9 (H) 06/08/2018   HGBA1C 5.0 06/03/2018      PT/INR:  Recent Labs    06/08/18 1317  LABPROT 21.5*  INR 1.9*   Radiology: Dg Chest Port 1 View  Result Date: 06/09/2018 CLINICAL DATA:  Status post AVR EXAM: PORTABLE CHEST 1 VIEW COMPARISON:  06/08/18 FINDINGS: Endotracheal tube and gastric catheter have been removed in the interval. Swan-Ganz catheter, mediastinal drain and pericardial drain are noted and stable. No pneumothorax is seen. Mild bibasilar atelectasis is noted increased from the prior study. No new focal bony abnormality is noted. Changes of aortic valve replacement are noted. IMPRESSION: Mild bibasilar atelectasis new from the previous day. Electronically Signed   By: Alcide Clever M.D.   On: 06/09/2018 07:03   Dg Chest Port 1 View  Result Date: 06/08/2018 CLINICAL DATA:  Aortic valve replacement EXAM: PORTABLE CHEST 1 VIEW COMPARISON:  06/03/2018 FINDINGS: Postsurgical changes are seen. Endotracheal tube and gastric catheter are noted in satisfactory position. Swan-Ganz catheter is noted in the pulmonary outflow tract. Changes consistent with aortic valve replacement are noted. Mediastinal and pericardial drains are seen. No pneumothorax is noted. IMPRESSION: Postsurgical change.  Tubes and lines as described. Electronically Signed   By: Alcide Clever M.D.   On: 06/08/2018 13:54     Assessment/Plan: S/P Procedure(s) (LRB): AORTIC VALVE REPLACEMENT (AVR), ON PUMP,  USING MAGNA EASE AORTIC BIOPROSTHESIS VALVE (N/A) TRANSESOPHAGEAL ECHOCARDIOGRAM (TEE) (N/A) Mobilize Diuresis d/c tubes/lines See progression orders Expected Acute  Blood - loss Anemia- continue to monitor  On IV amiodarone for transient A. fib during procedure On Lovenox 40 mg every 12 hours, for history of PE found incidentally on his previous admission, was on Xarelto preop   Delight Ovens 06/09/2018 7:30 AM

## 2018-06-09 NOTE — Progress Notes (Signed)
CT surgery p.m. Rounds  Patient in sinus rhythm stable vital signs on IV amiodarone and low dose milrinone Room air saturation 95% P.m. hemoglobin 12, glucose 160

## 2018-06-09 NOTE — Op Note (Signed)
NAME: Kevin Rojas, Kevin Rojas MEDICAL RECORD FY:[id number] ACCOUNT [id number] DATE OF BIRTH:May 06, 1955 FACILITY: [facility] LOCATION: [facility] PHYSICIAN:Cleone Hulick Bari Tresten Pantoja, MD  OPERATIVE REPORT  DATE OF PROCEDURE:  06/08/2018  PREOPERATIVE DIAGNOSES:  Severe aortic insufficiency with history of ventricular fibrillation arrest at home.  POSTOPERATIVE DIAGNOSES:  Severe aortic insufficiency with history of ventricular fibrillation arrest at home.  SURGICAL PROCEDURE:  Aortic valve replacement with a pericardial tissue valve, [employer], model 3300 TFX, 25 mm, serial [id number].  SURGEON:  Sheliah Plane, MD  FIRST ASSISTANT:  Jari Favre, [place]  BRIEF HISTORY:  The patient is a 63 year old male who had presented in March of 2020 with an at-home cardiac arrest, resuscitated with CPR by his wife.  He was admitted and underwent cooling protocol.  Ultimately was discharged to rehab with near  complete return of neurologic function.  Echocardiogram showed severe aortic insufficiency.  Cardiac catheterization was performed that showed no significant coronary artery disease.  CTA of the chest showed mild ascending aortic dilatation, depending on  the scan, and measurement 4.3-4.2 cm in size, with the aortic root slightly smaller.  Because both the patient's initial presentation and recovery from rehab and the onset of COVID pandemic, it was decided to delay his surgery.  He had been stable at  home with limited activities and a LifeVest on.  He had no further events and came in on 06/01 for aortic valve replacement.  His wife had her ascending aorta replaced and has a mechanical valve in place.  He had watched the issues with long-term  Coumadin use and preferred to have a tissue valve.  Risks and options were discussed with the patient in detail, and he was agreeable and signed informed consent.  DESCRIPTION OF PROCEDURE:  The patient underwent general endotracheal anesthesia without incident.  Skin of the  chest and legs was prepped with Betadine and draped in the usual sterile manner.  Dr. ____ placed a TEE probe, and we carefully examined the  valve.  Although preoperative evaluation had suggested a bicuspid valve, there was severe AI.  Mitral valve was intact.  Ejection fraction was down to approximately 45%.  After appropriate timeout, we then proceeded with median sternotomy.  The ascending  aorta was carefully examined as we were on the borderline of this replacement versus not replacing it.  On physical measurement with calipers, ascending aorta was 4.2 mm outside diameter.  It was decided to proceed with aortic valve replacement.  The  patient was systemically heparinized.  The ascending aorta was cannulated.  A dual-stage venous cannula was placed.  Retrograde cardioplegic catheter was placed.  The patient was placed on cardiopulmonary bypass 2.4 L per minute per sq m.  A right  superior pulmonary vein vent was placed.  The patient's body temperature was cooled to 32 degrees.  Aortic root vent cardioplegia needle was introduced into the ascending aorta and aortic crossclamp was applied.  While the heart was beating, antegrade  cardioplegia was administered with arrest.  Antegrade plegia was stopped and retrograde plegia was administered.  Myocardial septal temperatures were monitored throughout the crossclamp.  Transverse aortotomy was performed.  This gave good exposure of  both the right and left coronary ostium, which appeared free of disease, though the right coronary ostium was significantly smaller than the left main.  Although the preoperative evaluation suggested a bicuspid valve, on careful examination of the valve,  there were 3 completely formed commissures in the normal anatomic locations.  The leaflets were attenuated, and there  was some fusion of the noncoronary leaflet and the left coronary leaflet.  We then excised the leaflets, sized the annulus for a 25  pericardial tissue valve,  [employer] model 3300 TFX, 25 mm, serial [id number].  Ti-Cron #2 pledgeted sutures were placed with the pledgets on the ventricular surface in a circumferential manner around the annulus.  These were used to secure the  valve in place, and the valve seated well without impingement on the right or left coronary ostium.  The aortotomy was then closed over felt strips with horizontal mattress 4-0 Prolene.  Prior to complete closure, the heart was allowed to passively fill  and deair.  Intermittently, retrograde cardioplegia was administered.  The aortotomy was then completed with a running over-and-over suture.  Warm blood cardioplegia was administered retrograde.  Aortic root vent was turned on to further deair the heart,  and aortic crossclamp was removed with a total crossclamp time of 69 minutes.  The patient required electrical defibrillation to turn into a sinus rhythm.  Initially, it had a slow rate and required pacing, but he returned to a sinus rhythm.  Atrial and  ventricular pacing wires were applied to increase rate.  The right superior pulmonary vein and retrograde cardioplegia catheter were removed.  TEE showed good function of the implanted valve.  The patient's body temperature rewarmed to 37 degrees.  He  was then ventilated and weaned from cardiopulmonary bypass without difficulty and remained hemodynamically stable and was decannulated in the usual fashion.  Protamine sulfate was administered with operative field hemostatic.  Pacing wires had been  applied.  Two Blake mediastinal drains were left in the mediastinum.  Pericardium was loosely reapproximated.  Sternum was closed with #6 stainless steel wire.  Fascia closed with interrupted 0 Vicryl, running 3-0 Vicryl in subcutaneous tissue, 4-0  subcuticular stitch in skin edges.  Dry dressings were applied.  Sponge and needle count was reported as correct at completion of the procedure.  The patient tolerated the procedure without  obvious complication.  The RF tag screening reported clear code.   The patient did not require any blood bank blood products during the operative procedure.  Total pump time was 118 minutes.  LN/NUANCE  D:06/09/2018 T:06/09/2018 JOB:[id number]

## 2018-06-09 NOTE — Consult Note (Signed)
Cardiology Consultation:   Patient ID: GAD EMGE MRN: [id number]; DOB: September 20, 1955  Admit date: 06/08/2018 Date of Consult: 06/09/2018  Primary Care Provider: [name], MD Primary Cardiologist: Tonny Bollman, MD  Primary Electrophysiologist:  Lewayne Bunting, MD    Patient Profile:   Kevin Rojas is a 63 y.o. male with a hx of LBBB, renal calculi only prior to his hospitalization in march (discussed below) who is being seen today for the evaluation of post-op  Possible need for pacing, ?ICD decisions at the request of Dr. Tyrone Sage.  History of Present Illness:   Kevin Rojas was admitted to [facility] a couple months ago (03/24/2018) after suffering an V. fib cardiac arrest at home. He was treated with CPR x3 minutes by family as well as defibrillation by EMS. He was intubated and treated with cooling protocol. He was subsequently extubated, w/u has noted no CAD, a bicuspid AV with dilated ascending AO/root, and severe AI. LVEF by LV gram at cath 45-50%, by initial echo was the same 45-50% Also noted later in his stay incidentally to have a PE and started on Xarelto.  He was planned to discharge to rehab and return afterwards for AV surgery. EP saw him durng his stay with plans to discharge with lifevest and revisit ICD post AVR.  He was brought yesterday for his AVR surgery, and is now POD # 1, AORTIC VALVE REPLACEMENT (AVR), ON PUMP, USING MAGNA EASE AORTIC BIOPROSTHESIS VALVE (N/A)  EP is asked to the case given above and some concern he may need PPM post AVR.    He is on amiodarone gtt for some PAFib during surgery On Lovenox for his h/o PE Dopamine gtt Milrinone gtt NTG gtt  LABS K+ 4.5 Mag 2.3 BUN/Creat 9/1.13 WBC 15.1 H/H 11/35 Plts 136   Past Medical History:  Diagnosis Date  . Aortic insufficiency   . Cardiac arrest (HCC) 03/24/2018  . Dysrhythmia   . History of kidney stones   . Pneumonia    history of  . Pulmonary embolism (HCC)    04/04/18     Past Surgical History:  Procedure Laterality Date  . AORTIC ARCH ANGIOGRAPHY N/A 04/02/2018   Procedure: AORTIC ARCH ANGIOGRAPHY;  Surgeon: Lennette Bihari, MD;  Location: [facility] INVASIVE CV LAB;  Service: Cardiovascular;  Laterality: N/A;  . RIGHT/LEFT HEART CATH AND CORONARY ANGIOGRAPHY N/A 04/02/2018   Procedure: RIGHT/LEFT HEART CATH AND CORONARY ANGIOGRAPHY;  Surgeon: Lennette Bihari, MD;  Location: [facility] INVASIVE CV LAB;  Service: Cardiovascular;  Laterality: N/A;     Home Medications:  Prior to Admission medications   Medication Sig Start Date End Date Taking? Authorizing Provider  metoCLOPramide (REGLAN) 5 MG tablet Take 1 tablet (5 mg total) by mouth 4 (four) times daily -  before meals and at bedtime. 04/13/18 06/12/18 Yes Berton Bon, NP  Rivaroxaban (XARELTO) 20 MG TABS tablet Take 1 tablet (20 mg total) by mouth daily with supper. 04/13/18  Yes Berton Bon, NP  acetaminophen (TYLENOL) 325 MG tablet Take 2 tablets (650 mg total) by mouth every 4 (four) hours as needed for headache or mild pain. Patient not taking: Reported on 05/28/2018 04/13/18   Berton Bon, NP  acetaminophen (TYLENOL) 500 MG tablet Take 500 mg by mouth every 6 (six) hours as needed for moderate pain or headache.    [provider]  pantoprazole (PROTONIX) 40 MG tablet Take 1 tablet (40 mg total) by mouth daily. Patient not taking: Reported on 05/28/2018 04/13/18 06/12/18  Berton Bon, NP  senna-docusate (SENOKOT-S) 8.6-50 MG tablet Take 1 tablet by mouth at bedtime as needed for mild constipation. Patient not taking: Reported on 05/28/2018 04/13/18   Berton Bon, NP    Inpatient Medications: Scheduled Meds: . acetaminophen  1,000 mg Oral Q6H   Or  . acetaminophen (TYLENOL) oral liquid 160 mg/5 mL  1,000 mg Per Tube Q6H  . aspirin EC  325 mg Oral Daily   Or  . aspirin  324 mg Per Tube Daily  . bisacodyl  10 mg Oral Daily   Or  . bisacodyl  10 mg Rectal Daily  . Chlorhexidine Gluconate Cloth  6 each  Topical Daily  . docusate sodium  200 mg Oral Daily  . enoxaparin (LOVENOX) injection  40 mg Subcutaneous Q12H  . insulin aspart  0-24 Units Subcutaneous Q4H  . metoCLOPramide (REGLAN) injection  10 mg Intravenous Q6H  . metoprolol tartrate  12.5 mg Oral BID   Or  . metoprolol tartrate  12.5 mg Per Tube BID  . [START ON 06/10/2018] pantoprazole  40 mg Oral Daily  . sodium chloride flush  10-40 mL Intracatheter Q12H  . sodium chloride flush  3 mL Intravenous Q12H   Continuous Infusions: . sodium chloride Stopped (06/08/18 1859)  . sodium chloride    . sodium chloride    . albumin human 12.5 g (06/08/18 1533)  . amiodarone 30 mg/hr (06/09/18 0800)  . cefUROXime (ZINACEF)  IV Stopped (06/09/18 0425)  . dexmedetomidine (PRECEDEX) IV infusion Stopped (06/08/18 1629)  . DOPamine 1 mcg/kg/min (06/09/18 0800)  . lactated ringers    . lactated ringers 10 mL/hr at 06/09/18 0800  . lactated ringers    . milrinone 0.125 mcg/kg/min (06/09/18 0800)  . nitroGLYCERIN 30 mcg/min (06/09/18 0500)  . phenylephrine (NEO-SYNEPHRINE) Adult infusion Stopped (06/08/18 1805)   PRN Meds: sodium chloride, albumin human, lactated ringers, metoprolol tartrate, morphine injection, ondansetron (ZOFRAN) IV, oxyCODONE, sodium chloride flush, sodium chloride flush, traMADol  Allergies:   No Known Allergies  Social History:   Social History   Socioeconomic History  . Marital status: Married    Spouse name: Not on file  . Number of children: Not on file  . Years of education: Not on file  . Highest education level: Not on file  Occupational History  . Not on file  Social Needs  . Financial resource strain: Not on file  . Food insecurity:    Worry: Not on file    Inability: Not on file  . Transportation needs:    Medical: Not on file    Non-medical: Not on file  Tobacco Use  . Smoking status: Never Smoker  . Smokeless tobacco: Never Used  Substance and Sexual Activity  . Alcohol use: Never     Frequency: Never  . Drug use: Never  . Sexual activity: Not on file  Lifestyle  . Physical activity:    Days per week: Not on file    Minutes per session: Not on file  . Stress: Not on file  Relationships  . Social connections:    Talks on phone: Not on file    Gets together: Not on file    Attends religious service: Not on file    Active member of club or organization: Not on file    Attends meetings of clubs or organizations: Not on file    Relationship status: Not on file  . Intimate partner violence:    Fear of current or ex partner: Not  on file    Emotionally abused: Not on file    Physically abused: Not on file    Forced sexual activity: Not on file  Other Topics Concern  . Not on file  Social History Narrative  . Not on file    Family History:   Family History  Problem Relation Age of Onset  . Pulmonary embolism Mother   . Prostate cancer Father        passed awary at 33     ROS:  Please see the history of present illness.  All other ROS reviewed and negative.     Physical Exam/Data:   Vitals:   06/09/18 0800 06/09/18 0815 06/09/18 0830 06/09/18 0845  BP: 107/74 95/81 98/82  (!) 82/69  Pulse: 65 68 64 65  Resp: (!) 21 (!) 22 (!) 24 (!) 23  Temp: 98.6 F (37 C)     TempSrc: Core (Comment)     SpO2: 96% 96% 93% 95%  Weight:      Height:        Intake/Output Summary (Last 24 hours) at 06/09/2018 0931 Last data filed at 06/09/2018 0800 Gross per 24 hour  Intake 5206.97 ml  Output 7360 ml  Net -2153.03 ml   Last 3 Weights 06/09/2018 06/08/2018 06/03/2018  Weight (lbs) 151 lb 10.8 oz 154 lb 6.4 oz 154 lb 6.4 oz  Weight (kg) 68.8 kg 70.035 kg 70.035 kg     Body mass index is 22.4 kg/m.  General:  Well nourished, well developed, in no acute distress with dry midline sternotomy bandage HEENT: normal Lymph: no adenopathy Neck: 6 cm JVD Endocrine:  No thryomegaly Vascular: No carotid bruits; FA pulses 2+ bilaterally without bruits  Cardiac:  normal S1, S2;  RRR; no murmur  Lungs:  clear to auscultation bilaterally, no wheezing, rhonchi or rales  Abd: soft, nontender, no hepatomegaly  Ext: no edema Musculoskeletal:  No deformities, BUE and BLE strength normal and equal Skin: warm and dry  Neuro:  CNs 2-12 intact, no focal abnormalities noted Psych:  Normal affect   EKG:  The EKG was personally reviewed and demonstrates:   This morning, appears an accelerated junctional rhythm in review with dr. Ladona Ridgel 06/08/2018 SR/V paced Telemetry:  Telemetry was personally reviewed and demonstrates:  NSR with atrial pacing followed by junctional rhythm after pacing turned down to 50.  Relevant CV Studies:  03/25/2018: TTE IMPRESSIONS 1. The left ventricle has mildly reduced systolic function, with an ejection fraction of 45-50%. The cavity size was mildly dilated. Left ventricular diastolic Doppler parameters are consistent with pseudonormalization.  2. The right ventricle has normal systolic function. The cavity was normal. There is no increase in right ventricular wall thickness.  3. Right atrial size was mildly dilated.  4. Trivial pericardial effusion is present.  5. Mild thickening of the mitral valve leaflet.  6. The aortic valveis bicuspid Aortic valve regurgitation is severe by color flow Doppler.  7. The aortic valve appears to be a bicuspid or functionally bicuspid AV. There is severe AI.     The aortic root is mildly dilated.  8. There is mild dilatation of the ascending aorta.  9.     The aortic valve appears to be a bicuspid or functionally bicuspid AV. There is severe AI.     The aortic root is mildly dilated .     There is concern for a Type A aortic dissection.     Suggest STAT CT angiogram of the chest for further  evaluation.      10. The inferior vena cava was normal in size with <50% respiratory variability. 11. The interatrial septum was not assessed.  FINDINGS  Left Ventricle: The left ventricle has mildly reduced systolic  function, with an ejection fraction of 45-50%. The cavity size was mildly dilated. There is no increase in left ventricular wall thickness. Left ventricular diastolic Doppler parameters are  consistent with pseudonormalization. Right Ventricle: The right ventricle has normal systolic function. The cavity was normal. There is no increase in right ventricular wall thickness. Left Atrium: left atrial size was normal in size Right Atrium: right atrial size was mildly dilated. Right atrial pressure is estimated at 8 mmHg. Interatrial Septum: The interatrial septum was not assessed. Pericardium: Trivial pericardial effusion is present. Mitral Valve: The mitral valve is normal in structure. Mild thickening of the mitral valve leaflet. Mitral valve regurgitation is trivial by color flow Doppler. Tricuspid Valve: The tricuspid valve is normal in structure. Tricuspid valve regurgitation is mild by color flow Doppler. Aortic Valve: The aortic valveis bicuspid Aortic valve regurgitation is severe by color flow Doppler. The aortic valve appears to be a bicuspid or functionally bicuspid AV. There is severe AI. The aortic root is mildly dilated.   Pulmonic Valve: The pulmonic valve was normal in structure. Pulmonic valve regurgitation is not visualized by color flow Doppler. Aorta: There is mild dilatation of the ascending aorta. The aortic valve appears to be a bicuspid or functionally bicuspid AV. There is severe AI. The aortic root is mildly dilated . There is concern for a Type A aortic dissection. Suggest STAT CT angiogram of the chest for further evaluation.    Laboratory Data:  Chemistry Recent Labs  Lab 06/03/18 1152  06/08/18 1327  06/08/18 1833 06/08/18 1836 06/08/18 1841 06/09/18 0412  NA 138   < > 139   < >  --  137 138 133*  K 4.2   < > 4.3   < >  --  4.2 4.2 4.5  CL 110  --  105  --   --  105  --  102  CO2 20*  --   --   --   --   --   --  22  GLUCOSE 80   < > 126*  --   --  131*   --  146*  BUN 9  --  10  --   --  10  --  9  CREATININE 1.17  --  0.90  --  1.05 0.90  --  1.13  CALCIUM 9.4  --   --   --   --   --   --  8.3*  GFRNONAA >60  --   --   --  >60  --   --  >60  GFRAA >60  --   --   --  >60  --   --  >60  ANIONGAP 8  --   --   --   --   --   --  9   < > = values in this interval not displayed.    Recent Labs  Lab 06/03/18 1152  PROT 7.3  ALBUMIN 3.8  AST 15  ALT 16  ALKPHOS 63  BILITOT 1.4*   Hematology Recent Labs  Lab 06/08/18 1317  06/08/18 1833 06/08/18 1836 06/08/18 1841 06/09/18 0412  WBC 13.9*  --  12.9*  --   --  15.1*  RBC 3.90*  --  4.07*  --   --  4.17*  HGB 10.8*   < > 11.3* 11.9* 11.6* 11.5*  HCT 33.4*   < > 34.6* 35.0* 34.0* 35.1*  MCV 85.6  --  85.0  --   --  84.2  MCH 27.7  --  27.8  --   --  27.6  MCHC 32.3  --  32.7  --   --  32.8  RDW 13.7  --  13.5  --   --  13.5  PLT 126*  --  128*  --   --  136*   < > = values in this interval not displayed.   Cardiac EnzymesNo results for input(s): TROPONINI in the last 168 hours. No results for input(s): TROPIPOC in the last 168 hours.  BNPNo results for input(s): BNP, PROBNP in the last 168 hours.  DDimer No results for input(s): DDIMER in the last 168 hours.  Radiology/Studies:   Dg Chest Port 1 View Result Date: 06/09/2018 CLINICAL DATA:  Status post AVR EXAM: PORTABLE CHEST 1 VIEW COMPARISON:  06/08/18 FINDINGS: Endotracheal tube and gastric catheter have been removed in the interval. Swan-Ganz catheter, mediastinal drain and pericardial drain are noted and stable. No pneumothorax is seen. Mild bibasilar atelectasis is noted increased from the prior study. No new focal bony abnormality is noted. Changes of aortic valve replacement are noted. IMPRESSION: Mild bibasilar atelectasis new from the previous day. Electronically Signed   By: Alcide Clever M.D.   On: 06/09/2018 07:03    Assessment and Plan:   1. Bicuspid AV with severe AI     Now s/p AVR (bioprosthetic)     POD #1   2. Inter-op PAFib    On amiodarone gtt    On therapeutic lovenox post-op given h/o PE  3. H/o VF arrest     Follow his post-op conduction once off gtts      If he does NOT require pacing support, ideally would allow discharge again with his Life vest and plan for outpatient ICD     For questions or updates, please contact [facility] Please consult [email or web address] for contact info under   Signed, Sheilah Pigeon, PA-C  06/09/2018 9:31 AM  EP Attending  Patient seen and examined. Agree with the findings as noted above. The patient has a h/o VF arrest and found to have surgical aortic valve disease due to AI. His EF is 45%. Postoperatively he has done well although he had brief atrial fib. He remains on amiodarone. His underlying rhythm is sinus with an accelerated junctional escape today and intermittant retrograde VA conduction with subsequent AV conducted beats. His rates are in the 80's. Going forward it would be ideal if he could go home an heal for a month or two with a life vest in place and then return for an ICD for secondary prevention. However, if he requires back up pacing support then insertion of a DDD ICD would be recommended.  Leonia Reeves.D.

## 2018-06-10 ENCOUNTER — Inpatient Hospital Stay (HOSPITAL_COMMUNITY): Payer: BC Managed Care – PPO

## 2018-06-10 DIAGNOSIS — I480 Paroxysmal atrial fibrillation: Secondary | ICD-10-CM

## 2018-06-10 LAB — BASIC METABOLIC PANEL
Anion gap: 10 (ref 5–15)
BUN: 24 mg/dL — ABNORMAL HIGH (ref 8–23)
CO2: 23 mmol/L (ref 22–32)
Calcium: 8.6 mg/dL — ABNORMAL LOW (ref 8.9–10.3)
Chloride: 98 mmol/L (ref 98–111)
Creatinine, Ser: 1.72 mg/dL — ABNORMAL HIGH (ref 0.61–1.24)
GFR calc Af Amer: 48 mL/min — ABNORMAL LOW (ref >60)
GFR calc non Af Amer: 42 mL/min — ABNORMAL LOW (ref >60)
Glucose, Bld: 97 mg/dL (ref 70–99)
Potassium: 4.3 mmol/L (ref 3.5–5.1)
Sodium: 131 mmol/L — ABNORMAL LOW (ref 135–145)

## 2018-06-10 LAB — CBC
HCT: 33.8 % — ABNORMAL LOW (ref 39.0–52.0)
Hemoglobin: 11.3 g/dL — ABNORMAL LOW (ref 13.0–17.0)
MCH: 28.1 pg (ref 26.0–34.0)
MCHC: 33.4 g/dL (ref 30.0–36.0)
MCV: 84.1 fL (ref 80.0–100.0)
Platelets: 127 10*3/uL — ABNORMAL LOW (ref 150–400)
RBC: 4.02 MIL/uL — ABNORMAL LOW (ref 4.22–5.81)
RDW: 13.7 % (ref 11.5–15.5)
WBC: 17.9 10*3/uL — ABNORMAL HIGH (ref 4.0–10.5)
nRBC: 0 % (ref 0.0–0.2)

## 2018-06-10 LAB — GLUCOSE, CAPILLARY
Glucose-Capillary: 124 mg/dL — ABNORMAL HIGH (ref 70–99)
Glucose-Capillary: 70 mg/dL (ref 70–99)
Glucose-Capillary: 92 mg/dL (ref 70–99)
Glucose-Capillary: 94 mg/dL (ref 70–99)
Glucose-Capillary: 94 mg/dL (ref 70–99)
Glucose-Capillary: 95 mg/dL (ref 70–99)

## 2018-06-10 MED ORDER — ENOXAPARIN SODIUM 30 MG/0.3ML ~~LOC~~ SOLN
30.0000 mg | Freq: Two times a day (BID) | SUBCUTANEOUS | Status: DC
  Administered 2018-06-10 – 2018-06-13 (×7): 30 mg via SUBCUTANEOUS
  Filled 2018-06-10 (×7): qty 0.3

## 2018-06-10 MED ORDER — FUROSEMIDE 10 MG/ML IJ SOLN
20.0000 mg | Freq: Two times a day (BID) | INTRAMUSCULAR | Status: AC
  Administered 2018-06-10 (×2): 20 mg via INTRAVENOUS
  Filled 2018-06-10 (×2): qty 2

## 2018-06-10 NOTE — TOC Initial Note (Addendum)
Transition of Care [facility]) - Initial/Assessment Note    Patient Details  Name: Kevin Rojas MRN: [id number] Date of Birth: [birth year]  [facility] CM/SW Contact:    Leone Haven, RN Phone Number: 06/10/2018, 4:33 PM  Clinical Narrative:                 From home with spouse, severe aortic insuff, s/p AVR, AKI, PE, on xarelto pta. He has life vest in the room . Will have outpatient ICD. Patient states his PCP is Dr. Katrinka Blazing, he will need a follow up apt prior to dc.   Expected Discharge Plan: Home/Self Care Barriers to Discharge: No Barriers Identified   Patient Goals and CMS Choice Patient states their goals for this hospitalization and ongoing recovery are:: get better   Choice offered to / list presented to : NA  Expected Discharge Plan and Services Expected Discharge Plan: Home/Self Care   Discharge Planning Services: CM Consult Post Acute Care Choice: NA Living arrangements for the past 2 months: Single Family Home                 DME Arranged: N/A DME Agency: NA       HH Arranged: NA HH Agency: NA        Prior Living Arrangements/Services Living arrangements for the past 2 months: Single Family Home Lives with:: Spouse Patient language and need for interpreter reviewed:: Yes Do you feel safe going back to the place where you live?: Yes      Need for Family Participation in Patient Care: No (Comment) Care giver support system in place?: Yes (comment)   Criminal Activity/Legal Involvement Pertinent to Current Situation/Hospitalization: No - Comment as needed  Activities of Daily Living Home Assistive Devices/Equipment: Dentures (specify type) ADL Screening (condition at time of admission) Patient's cognitive ability adequate to safely complete daily activities?: Yes Is the patient deaf or have difficulty hearing?: No Does the patient have difficulty seeing, even when wearing glasses/contacts?: No Does the patient have difficulty  concentrating, remembering, or making decisions?: No Patient able to express need for assistance with ADLs?: Yes Does the patient have difficulty dressing or bathing?: No Independently performs ADLs?: Yes (appropriate for developmental age) Does the patient have difficulty walking or climbing stairs?: No Weakness of Legs: None Weakness of Arms/Hands: None  Permission Sought/Granted                  Emotional Assessment Appearance:: Appears stated age Attitude/Demeanor/Rapport: (Appropriate) Affect (typically observed): Accepting Orientation: : Oriented to Self, Oriented to Place, Oriented to  Time, Oriented to Situation Alcohol / Substance Use: Not Applicable Psych Involvement: No (comment)  Admission diagnosis:  AI Patient Active Problem List   Diagnosis Date Noted  . S/P AVR (aortic valve replacement) 06/08/2018  . Dyspepsia --felt to be due to dysmotility 04/10/2018  . Aortic regurgitation due to bicuspid aortic valve 04/10/2018  . Cognitive disorder   . Constipation 04/07/2018  . Debility 04/05/2018  . Acute pulmonary embolism (HCC) 04/05/2018  . Acute respiratory failure with hypoxia (HCC)   . Aspiration into airway   . Severe aortic insufficiency 03/26/2018  . Cardiac arrest (HCC) 03/24/2018  . LBBB (left bundle branch block) 03/24/2018  . Elevated troponin 03/24/2018  . Hypokalemia 03/24/2018  . Endotracheally intubated 03/24/2018  . Abnormal CXR 03/24/2018   PCP:  [name], MD Pharmacy:   [facility] [phone] - [place], [place] - [address] AT [facility] [address]  [address] [place] [address] Phone: [phone] Fax: [phone]  [facility] - [place], [place] - [address] [place] [address] Phone: [phone] Fax: [phone]     Social Determinants of Health (SDOH) Interventions    Readmission Risk Interventions Readmission Risk  Prevention Plan 06/10/2018  Transportation Screening Complete  HRI or Home Care Consult Complete  Social Work Consult for Recovery Care Planning/Counseling Complete  Palliative Care Screening Not Applicable  Medication Review Oceanographer) Complete

## 2018-06-10 NOTE — Progress Notes (Signed)
Progress Note  Patient Name: Kevin Rojas Date of Encounter: 06/10/2018  Primary Cardiologist: Tonny Bollman, MD   Subjective   "I'm doing alright."  Inpatient Medications    Scheduled Meds: . acetaminophen  1,000 mg Oral Q6H  . amiodarone  200 mg Oral BID  . aspirin EC  325 mg Oral Daily  . bisacodyl  10 mg Oral Daily  . Chlorhexidine Gluconate Cloth  6 each Topical Daily  . docusate sodium  200 mg Oral Daily  . enoxaparin (LOVENOX) injection  30 mg Subcutaneous Q12H  . furosemide  20 mg Intravenous BID  . insulin aspart  0-24 Units Subcutaneous Q4H  . metoCLOPramide (REGLAN) injection  10 mg Intravenous Q6H  . metoprolol tartrate  12.5 mg Oral BID   Or  . metoprolol tartrate  12.5 mg Per Tube BID  . pantoprazole  40 mg Oral Daily  . sodium chloride flush  10-40 mL Intracatheter Q12H  . sodium chloride flush  3 mL Intravenous Q12H   Continuous Infusions: . sodium chloride    . lactated ringers    . nitroGLYCERIN 30 mcg/min (06/09/18 0500)   PRN Meds: metoprolol tartrate, morphine injection, ondansetron (ZOFRAN) IV, oxyCODONE, sodium chloride flush, sodium chloride flush, traMADol   Vital Signs    Vitals:   06/10/18 0600 06/10/18 0700 06/10/18 0745 06/10/18 0800  BP: (!) 120/96 90/68  103/83  Pulse:  60  94  Resp: 19 19  19   Temp:   98.6 F (37 C)   TempSrc:   Oral   SpO2:  92%  98%  Weight:      Height:        Intake/Output Summary (Last 24 hours) at 06/10/2018 0932 Last data filed at 06/10/2018 0800 Gross per 24 hour  Intake 835.16 ml  Output 780 ml  Net 55.16 ml   Last 3 Weights 06/10/2018 06/09/2018 06/08/2018  Weight (lbs) 151 lb 7.3 oz 151 lb 10.8 oz 154 lb 6.4 oz  Weight (kg) 68.7 kg 68.8 kg 70.035 kg      Telemetry    Accelerated junctional rhythm with sinus bradycardia - Personally Reviewed  ECG    Accelerated junctional rhythm with sinus brady - Personally Reviewed  Physical Exam   GEN: No acute distress.   Neck: No JVD Cardiac: RRR,  no murmurs, rubs, or gallops. Incision dry Respiratory: Clear to auscultation bilaterally. GI: Soft, nontender, non-distended  MS: No edema; No deformity. Neuro:  Nonfocal  Psych: Normal affect   Labs    Chemistry Recent Labs  Lab 06/03/18 1152  06/09/18 0412 06/09/18 1637 06/10/18 0310  NA 138   < > 133* 132* 131*  K 4.2   < > 4.5 4.4 4.3  CL 110   < > 102 100 98  CO2 20*  --  22 23 23   GLUCOSE 80   < > 146* 169* 97  BUN 9   < > 9 16 24*  CREATININE 1.17   < > 1.13 1.65* 1.72*  CALCIUM 9.4  --  8.3* 8.6* 8.6*  PROT 7.3  --   --   --   --   ALBUMIN 3.8  --   --   --   --   AST 15  --   --   --   --   ALT 16  --   --   --   --   ALKPHOS 63  --   --   --   --  BILITOT 1.4*  --   --   --   --   GFRNONAA >60   < > >60 44* 42*  GFRAA >60   < > >60 51* 48*  ANIONGAP 8  --  9 9 10    < > = values in this interval not displayed.     Hematology Recent Labs  Lab 06/09/18 0412 06/09/18 1637 06/10/18 0310  WBC 15.1* 16.4* 17.9*  RBC 4.17* 4.33 4.02*  HGB 11.5* 12.0* 11.3*  HCT 35.1* 36.9* 33.8*  MCV 84.2 85.2 84.1  MCH 27.6 27.7 28.1  MCHC 32.8 32.5 33.4  RDW 13.5 14.0 13.7  PLT 136* 145* 127*    Cardiac EnzymesNo results for input(s): TROPONINI in the last 168 hours. No results for input(s): TROPIPOC in the last 168 hours.   BNPNo results for input(s): BNP, PROBNP in the last 168 hours.   DDimer No results for input(s): DDIMER in the last 168 hours.   Radiology    Dg Chest Port 1 View Result Date: 06/10/2018 CLINICAL DATA:  Status post AVR EXAM: PORTABLE CHEST 1 VIEW COMPARISON:  06/09/2018 FINDINGS: Mediastinal tube, pericardial drain and Swan-Ganz catheter have been removed. Right jugular sheath remains in place. Cardiac shadow is stable. Postsurgical changes are again noted. Small bilateral pleural effusions are noted with mild bibasilar atelectasis. IMPRESSION: Interval removal of tubes and lines as described. Mild pleural effusions with bibasilar atelectasis.  Electronically Signed   By: Alcide Clever M.D.   On: 06/10/2018 07:09      Cardiac Studies   03/25/2018: TTE IMPRESSIONS 1. The left ventricle has mildly reduced systolic function, with an ejection fraction of 45-50%. The cavity size was mildly dilated. Left ventricular diastolic Doppler parameters are consistent with pseudonormalization. 2. The right ventricle has normal systolic function. The cavity was normal. There is no increase in right ventricular wall thickness. 3. Right atrial size was mildly dilated. 4. Trivial pericardial effusion is present. 5. Mild thickening of the mitral valve leaflet. 6. The aortic valveis bicuspid Aortic valve regurgitation is severe by color flow Doppler. 7. The aortic valve appears to be a bicuspid or functionally bicuspid AV. There is severe AI. The aortic root is mildly dilated. 8. There is mild dilatation of the ascending aorta. 9. The aortic valve appears to be a bicuspid or functionally bicuspid AV. There is severe AI. The aortic root is mildly dilated . There is concern for a Type A aortic dissection. Suggest STAT CT angiogram of the chest for further evaluation.  10. The inferior vena cava was normal in size with <50% respiratory variability. 11. The interatrial septum was not assessed.  FINDINGS Left Ventricle: The left ventricle has mildly reduced systolic function, with an ejection fraction of 45-50%. The cavity size was mildly dilated. There is no increase in left ventricular wall thickness. Left ventricular diastolic Doppler parameters are  consistent with pseudonormalization. Right Ventricle: The right ventricle has normal systolic function. The cavity was normal. There is no increase in right ventricular wall thickness. Left Atrium: left atrial size was normal in size Right Atrium: right atrial size was mildly dilated. Right atrial pressure is estimated at 8 mmHg. Interatrial Septum: The interatrial septum  was not assessed. Pericardium: Trivial pericardial effusion is present. Mitral Valve: The mitral valve is normal in structure. Mild thickening of the mitral valve leaflet. Mitral valve regurgitation is trivial by color flow Doppler. Tricuspid Valve: The tricuspid valve is normal in structure. Tricuspid valve regurgitation is mild by color flow Doppler.  Aortic Valve: The aortic valveis bicuspid Aortic valve regurgitation is severe by color flow Doppler. The aortic valve appears to be a bicuspid or functionally bicuspid AV. There is severe AI. The aortic root is mildly dilated.  Pulmonic Valve: The pulmonic valve was normal in structure. Pulmonic valve regurgitation is not visualized by color flow Doppler. Aorta: There is mild dilatation of the ascending aorta. The aortic valve appears to be a bicuspid or functionally bicuspid AV. There is severe AI. The aortic root is mildly dilated . There is concern for a Type A aortic dissection. Suggest STAT CT angiogram of the chest for further evaluation.   Patient Profile     63 y.o. male with a hx of LBBB, renal calculi only prior to his hospitalization in march after suffering anV. fib cardiac arrestat home. He was treated with CPR x3 minutes by family as well as defibrillation by EMS. He was intubated and treated with cooling protocol. He was subsequently extubated, w/u has noted no CAD, a bicuspid AV with dilated ascending AO/root, and severe AI. LVEF by LV gram at cath 45-50%, by initial echo was the same 45-50% Also noted later in his stay incidentally to have a PE and started on Xarelto.  He was planned to discharge to rehab and return afterwards for AV surgery. EP saw him durng his stay with plans to discharge with lifevest and revisit ICD post AVR.  he is now in electively for AVR, and is POD #2 today  Assessment & Plan    1. Bicuspid AV with severe AI     Now s/p AVR (bioprosthetic) AORTIC VALVE REPLACEMENT (AVR), ON PUMP, USING  MAGNA EASE AORTIC BIOPROSTHESIS VALVE (N/A)     POD #2  2. Inter-op PAFib    Amiodarone gtt >> PO last night    On therapeutic lovenox post-op given h/o PE  3. H/o VF arrest     Follow his post-op conduction once off gtts      If he does NOT require pacing support, ideally would allow discharge again with his Life vest and plan for outpatient ICD     For questions or updates, please contact [facility] Please consult [email or web address] for contact info under  Signed, Sheilah Pigeon, PA-C  06/10/2018, 9:32 AM    EP Attending  Patient seen and examined. Agree with above with minimal changes. ECG reveals accelerated junctional rhythm with LBBB and underlying sinus bradycardia. If he continues to do well, would not place ICD this hospitalization but give a Life Vest at discharge for secondary prevention. If he develops symptomatic bradycardia would place dual or biv ICD.   Leonia Reeves.D.

## 2018-06-10 NOTE — Progress Notes (Signed)
Patient ID: Kevin Rojas, male   DOB: 1955/06/14, 62 y.o.   MRN: [id number] [facility] Evening Rounds:  Hemodynamically stable in sinus rhythm. sats 100%   Urine output good.  Stable day.

## 2018-06-10 NOTE — Progress Notes (Signed)
Patient ID: Kevin Rojas, male   DOB: 1955-10-27, 63 y.o.   MRN: [id number] [facility] DAILY ICU PROGRESS NOTE                      [address]            [name] [address]          [phone]   2 Days Post-Op Procedure(s) (LRB): AORTIC VALVE REPLACEMENT (AVR), ON PUMP, USING MAGNA EASE AORTIC BIOPROSTHESIS VALVE 25MM (N/A) TRANSESOPHAGEAL ECHOCARDIOGRAM (TEE) (N/A)  Total Length of Stay:  LOS: 2 days   Subjective: Patient awake and neuro intact, walked in unit today  Objective: Vital signs in last 24 hours: Temp:  [97.7 F (36.5 C)-98.8 F (37.1 C)] 98.3 F (36.8 C) (06/03 0330) Pulse Rate:  [45-93] 60 (06/03 0700) Cardiac Rhythm: Atrial fibrillation (06/02 2000) Resp:  [0-28] 19 (06/03 0700) BP: (78-120)/(66-97) 90/68 (06/03 0700) SpO2:  [85 %-100 %] 92 % (06/03 0700) Arterial Line BP: (105-122)/(58-72) 105/58 (06/02 0800) Weight:  [68.7 kg] 68.7 kg (06/03 0500)  Filed Weights   06/08/18 0606 06/09/18 0500 06/10/18 0500  Weight: 70 kg 68.8 kg 68.7 kg    Weight change: -0.1 kg   Hemodynamic parameters for last 24 hours: PAP: (23-25)/(6-11) 25/6  Intake/Output from previous day: 06/02 0701 - 06/03 0700 In: 855.9 [I.V.:655.8; IV Piggyback:200.1] Out: 890 [Urine:750; Chest Tube:140]  Intake/Output this shift: No intake/output data recorded.  Current Meds: Scheduled Meds: . acetaminophen  1,000 mg Oral Q6H   Or  . acetaminophen (TYLENOL) oral liquid 160 mg/5 mL  1,000 mg Per Tube Q6H  . amiodarone  200 mg Oral BID  . aspirin EC  325 mg Oral Daily   Or  . aspirin  324 mg Per Tube Daily  . bisacodyl  10 mg Oral Daily   Or  . bisacodyl  10 mg Rectal Daily  . Chlorhexidine Gluconate Cloth  6 each Topical Daily  . docusate sodium  200 mg Oral Daily  . enoxaparin (LOVENOX) injection  30 mg Subcutaneous Q12H  . insulin aspart  0-24 Units Subcutaneous Q4H  . metoCLOPramide (REGLAN) injection  10 mg Intravenous Q6H  . metoprolol tartrate  12.5 mg  Oral BID   Or  . metoprolol tartrate  12.5 mg Per Tube BID  . pantoprazole  40 mg Oral Daily  . sodium chloride flush  10-40 mL Intracatheter Q12H  . sodium chloride flush  3 mL Intravenous Q12H   Continuous Infusions: . sodium chloride Stopped (06/08/18 1859)  . sodium chloride    . sodium chloride 10 mL/hr at 06/10/18 0600  . dexmedetomidine (PRECEDEX) IV infusion Stopped (06/08/18 1629)  . lactated ringers    . lactated ringers 10 mL/hr at 06/10/18 0600  . lactated ringers    . milrinone 0.062 mcg/kg/min (06/10/18 0600)  . nitroGLYCERIN 30 mcg/min (06/09/18 0500)  . phenylephrine (NEO-SYNEPHRINE) Adult infusion Stopped (06/08/18 1805)   PRN Meds:.sodium chloride, lactated ringers, metoprolol tartrate, morphine injection, ondansetron (ZOFRAN) IV, oxyCODONE, sodium chloride flush, sodium chloride flush, traMADol  General appearance: alert and cooperative Neurologic: intact Heart: regular rate and rhythm, S1, S2 normal, no murmur, click, rub or gallop Lungs: diminished breath sounds bibasilar Abdomen: soft, non-tender; bowel sounds normal; no masses,  no organomegaly Extremities: extremities normal, atraumatic, no cyanosis or edema and Homans sign is negative, no sign of DVT Wound: intact sternum  Lab Results: CBC: Recent Labs    06/09/18 1637 06/10/18 0310  WBC 16.4* 17.9*  HGB 12.0* 11.3*  HCT 36.9* 33.8*  PLT 145* 127*   BMET:  Recent Labs    06/09/18 1637 06/10/18 0310  NA 132* 131*  K 4.4 4.3  CL 100 98  CO2 23 23  GLUCOSE 169* 97  BUN 16 24*  CREATININE 1.65* 1.72*  CALCIUM 8.6* 8.6*    CMET: Lab Results  Component Value Date   WBC 17.9 (H) 06/10/2018   HGB 11.3 (L) 06/10/2018   HCT 33.8 (L) 06/10/2018   PLT 127 (L) 06/10/2018   GLUCOSE 97 06/10/2018   ALT 16 06/03/2018   AST 15 06/03/2018   NA 131 (L) 06/10/2018   K 4.3 06/10/2018   CL 98 06/10/2018   CREATININE 1.72 (H) 06/10/2018   BUN 24 (H) 06/10/2018   CO2 23 06/10/2018   TSH 1.863  03/25/2018   INR 1.9 (H) 06/08/2018   HGBA1C 5.0 06/03/2018      PT/INR:  Recent Labs    06/08/18 1317  LABPROT 21.5*  INR 1.9*   Radiology: Dg Chest Port 1 View  Result Date: 06/10/2018 CLINICAL DATA:  Status post AVR EXAM: PORTABLE CHEST 1 VIEW COMPARISON:  06/09/2018 FINDINGS: Mediastinal tube, pericardial drain and Swan-Ganz catheter have been removed. Right jugular sheath remains in place. Cardiac shadow is stable. Postsurgical changes are again noted. Small bilateral pleural effusions are noted with mild bibasilar atelectasis. IMPRESSION: Interval removal of tubes and lines as described. Mild pleural effusions with bibasilar atelectasis. Electronically Signed   By: Inez Catalina M.D.   On: 06/10/2018 07:09     Assessment/Plan: S/P Procedure(s) (LRB): AORTIC VALVE REPLACEMENT (AVR), ON PUMP, USING MAGNA EASE AORTIC BIOPROSTHESIS VALVE 25MM (N/A) TRANSESOPHAGEAL ECHOCARDIOGRAM (TEE) (N/A) Mobilize Diuresis d/c tubes/lines Expected Acute  Blood - loss Anemia- continue to monitor  Acute Kidney Injury (any one)  Increase in SCr by > 0.3 within 48 hours  Increase SCr to > 1.5 times baseline  Urine volume < 0.5 ml/kg/h for 6 hrs  ?Stage 1 - Increase in serum creatinine to 1.5 to 1.9 times baseline, or increase in serum creatinine by ?0.3 mg/dL (?26.5 micromol/L), or reduction in urine output to <0.5 mL/kg per hour for 6 to 12 hours.  ?Stage 2 - Increase in serum creatinine to 2.0 to 2.9 times baseline, or reduction in urine output to <0.5 mL/kg per hour for ?12 hours.  ?Stage 3 - Increase in serum creatinine to 3.0 times baseline, or increase in serum creatinine to ?4.0 mg/dL (?353.6 micromol/L), or reduction in urine output to <0.3 mL/kg per hour for ?24 hours, or anuria for ?12 hours, or the initiation of renal replacement therapy, or, in patients <18 years, decrease in eGFR to <35 mL   Lab Results  Component Value Date   CREATININE 1.72 (H) 06/10/2018   Estimated  Creatinine Clearance: 43.3 mL/min (A) (by C-G formula based on SCr of 1.72 mg/dL (H)). Stage 1 aki, monitor cr 1.72 On lovenox for history of dvt, was on 40 bid will decrease to 30 bid with increase in cr    Grace Isaac 06/10/2018 7:34 AM

## 2018-06-10 NOTE — Plan of Care (Signed)
Patient alert and oriented.  Sitting in bed, denies SOB, mild surgical pain.  Rates 4/10.  Monitoring continued.

## 2018-06-10 NOTE — Progress Notes (Signed)
Spoke with RN Maralyn Sago about the DC central line order, RN stated she is aware

## 2018-06-11 ENCOUNTER — Encounter (HOSPITAL_COMMUNITY): Payer: Self-pay | Admitting: Cardiothoracic Surgery

## 2018-06-11 ENCOUNTER — Inpatient Hospital Stay (HOSPITAL_COMMUNITY): Payer: BC Managed Care – PPO

## 2018-06-11 LAB — BASIC METABOLIC PANEL
Anion gap: 9 (ref 5–15)
BUN: 22 mg/dL (ref 8–23)
CO2: 25 mmol/L (ref 22–32)
Calcium: 8.4 mg/dL — ABNORMAL LOW (ref 8.9–10.3)
Chloride: 102 mmol/L (ref 98–111)
Creatinine, Ser: 1.36 mg/dL — ABNORMAL HIGH (ref 0.61–1.24)
GFR calc Af Amer: >60 mL/min (ref >60)
GFR calc non Af Amer: 55 mL/min — ABNORMAL LOW (ref >60)
Glucose, Bld: 127 mg/dL — ABNORMAL HIGH (ref 70–99)
Potassium: 4 mmol/L (ref 3.5–5.1)
Sodium: 136 mmol/L (ref 135–145)

## 2018-06-11 LAB — GLUCOSE, CAPILLARY
Glucose-Capillary: 110 mg/dL — ABNORMAL HIGH (ref 70–99)
Glucose-Capillary: 112 mg/dL — ABNORMAL HIGH (ref 70–99)
Glucose-Capillary: 138 mg/dL — ABNORMAL HIGH (ref 70–99)
Glucose-Capillary: 92 mg/dL (ref 70–99)
Glucose-Capillary: 99 mg/dL (ref 70–99)
Glucose-Capillary: 99 mg/dL (ref 70–99)

## 2018-06-11 LAB — CBC
HCT: 31.1 % — ABNORMAL LOW (ref 39.0–52.0)
Hemoglobin: 10.3 g/dL — ABNORMAL LOW (ref 13.0–17.0)
MCH: 27.7 pg (ref 26.0–34.0)
MCHC: 33.1 g/dL (ref 30.0–36.0)
MCV: 83.6 fL (ref 80.0–100.0)
Platelets: 116 10*3/uL — ABNORMAL LOW (ref 150–400)
RBC: 3.72 MIL/uL — ABNORMAL LOW (ref 4.22–5.81)
RDW: 13.7 % (ref 11.5–15.5)
WBC: 13.2 10*3/uL — ABNORMAL HIGH (ref 4.0–10.5)
nRBC: 0 % (ref 0.0–0.2)

## 2018-06-11 LAB — ECHO INTRAOPERATIVE TEE
Height: 69 in
Weight: 2470.39 oz

## 2018-06-11 MED ORDER — SODIUM CHLORIDE 0.9 % IV SOLN: 250.0000 mL | INTRAVENOUS | Status: DC | PRN

## 2018-06-11 MED ORDER — ONDANSETRON HCL 4 MG/2ML IJ SOLN: 4.0000 mg | Freq: Four times a day (QID) | INTRAMUSCULAR | Status: DC | PRN

## 2018-06-11 MED ORDER — INSULIN ASPART 100 UNIT/ML ~~LOC~~ SOLN
0.0000 [IU] | Freq: Three times a day (TID) | SUBCUTANEOUS | Status: DC
  Administered 2018-06-11 – 2018-06-12 (×2): 2 [IU] via SUBCUTANEOUS

## 2018-06-11 MED ORDER — MOVING RIGHT ALONG BOOK
Freq: Once | Status: AC
  Administered 2018-06-11: 09:00:00
  Filled 2018-06-11: qty 1

## 2018-06-11 MED ORDER — TRAMADOL HCL 50 MG PO TABS: 50.0000 mg | ORAL_TABLET | ORAL | Status: DC | PRN

## 2018-06-11 MED ORDER — AMIODARONE HCL 200 MG PO TABS: 200.0000 mg | ORAL_TABLET | Freq: Every day | ORAL | Status: DC

## 2018-06-11 MED ORDER — FUROSEMIDE 20 MG PO TABS
20.0000 mg | ORAL_TABLET | Freq: Every day | ORAL | Status: DC
  Administered 2018-06-11 – 2018-06-12 (×2): 20 mg via ORAL
  Filled 2018-06-11 (×2): qty 1

## 2018-06-11 MED ORDER — SODIUM CHLORIDE 0.9% FLUSH
3.0000 mL | Freq: Two times a day (BID) | INTRAVENOUS | Status: DC
  Administered 2018-06-11 – 2018-06-12 (×3): 3 mL via INTRAVENOUS

## 2018-06-11 MED ORDER — ONDANSETRON HCL 4 MG PO TABS: 4.0000 mg | ORAL_TABLET | Freq: Four times a day (QID) | ORAL | Status: DC | PRN

## 2018-06-11 MED ORDER — ACETAMINOPHEN 325 MG PO TABS: 650.0000 mg | ORAL_TABLET | Freq: Four times a day (QID) | ORAL | Status: DC | PRN

## 2018-06-11 MED ORDER — METOPROLOL TARTRATE 12.5 MG HALF TABLET
12.5000 mg | ORAL_TABLET | Freq: Two times a day (BID) | ORAL | Status: DC
  Administered 2018-06-11 – 2018-06-13 (×5): 12.5 mg via ORAL
  Filled 2018-06-11 (×5): qty 1

## 2018-06-11 MED ORDER — BISACODYL 10 MG RE SUPP: 10.0000 mg | Freq: Every day | RECTAL | Status: DC | PRN

## 2018-06-11 MED ORDER — SODIUM CHLORIDE 0.9% FLUSH
3.0000 mL | INTRAVENOUS | Status: DC | PRN
  Administered 2018-06-13: 3 mL via INTRAVENOUS
  Filled 2018-06-11: qty 3

## 2018-06-11 MED ORDER — PANTOPRAZOLE SODIUM 40 MG PO TBEC
40.0000 mg | DELAYED_RELEASE_TABLET | Freq: Every day | ORAL | Status: DC
  Administered 2018-06-11 – 2018-06-13 (×3): 40 mg via ORAL
  Filled 2018-06-11 (×3): qty 1

## 2018-06-11 MED ORDER — GUAIFENESIN ER 600 MG PO TB12: 600.0000 mg | ORAL_TABLET | Freq: Two times a day (BID) | ORAL | Status: DC | PRN

## 2018-06-11 MED ORDER — ASPIRIN EC 325 MG PO TBEC
325.0000 mg | DELAYED_RELEASE_TABLET | Freq: Every day | ORAL | Status: DC
  Administered 2018-06-11 – 2018-06-13 (×3): 325 mg via ORAL
  Filled 2018-06-11 (×3): qty 1

## 2018-06-11 MED ORDER — OXYCODONE HCL 5 MG PO TABS
5.0000 mg | ORAL_TABLET | ORAL | Status: DC | PRN
  Administered 2018-06-12: 22:00:00 5 mg via ORAL
  Filled 2018-06-11: qty 1

## 2018-06-11 MED ORDER — BISACODYL 5 MG PO TBEC
10.0000 mg | DELAYED_RELEASE_TABLET | Freq: Every day | ORAL | Status: DC | PRN
  Administered 2018-06-12: 10 mg via ORAL
  Filled 2018-06-11: qty 2

## 2018-06-11 MED ORDER — AMIODARONE HCL 200 MG PO TABS
200.0000 mg | ORAL_TABLET | Freq: Two times a day (BID) | ORAL | Status: DC
  Administered 2018-06-11 – 2018-06-13 (×4): 200 mg via ORAL
  Filled 2018-06-11 (×4): qty 1

## 2018-06-11 NOTE — Plan of Care (Signed)
Patient alert and oriented, resting in bed.  States he's feeling better this afternoon.  Monitoring as per protocol.

## 2018-06-11 NOTE — Discharge Instructions (Signed)

## 2018-06-11 NOTE — Progress Notes (Addendum)
Progress Note  Patient Name: Kevin Rojas Date of Encounter: 06/11/2018  Primary Cardiologist: Tonny Bollman, MD   Subjective   Tired, "Hard to get sleep here", otherwise doing ok, no CP, denies SOB  Inpatient Medications    Scheduled Meds: . amiodarone  200 mg Oral Q12H   Followed by  . [START ON 06/19/2018] amiodarone  200 mg Oral Daily  . aspirin EC  325 mg Oral Daily  . Chlorhexidine Gluconate Cloth  6 each Topical Daily  . enoxaparin (LOVENOX) injection  30 mg Subcutaneous Q12H  . furosemide  20 mg Oral Daily  . insulin aspart  0-24 Units Subcutaneous TID AC & HS  . metoprolol tartrate  12.5 mg Oral BID  . moving right along book   Does not apply Once  . pantoprazole  40 mg Oral QAC breakfast  . sodium chloride flush  3 mL Intravenous Q12H   Continuous Infusions: . sodium chloride     PRN Meds: sodium chloride, acetaminophen, bisacodyl **OR** bisacodyl, guaiFENesin, ondansetron **OR** ondansetron (ZOFRAN) IV, oxyCODONE, sodium chloride flush, traMADol   Vital Signs    Vitals:   06/11/18 0530 06/11/18 0545 06/11/18 0600 06/11/18 0731  BP:   120/89   Pulse: 89 86 87   Resp: 18 20 (!) 25   Temp:    99.4 F (37.4 C)  TempSrc:    Oral  SpO2: 95% 93% 93%   Weight:      Height:        Intake/Output Summary (Last 24 hours) at 06/11/2018 0809 Last data filed at 06/11/2018 0600 Gross per 24 hour  Intake 300 ml  Output 1335 ml  Net -1035 ml   Last 3 Weights 06/11/2018 06/10/2018 06/09/2018  Weight (lbs) 148 lb 2.4 oz 151 lb 7.3 oz 151 lb 10.8 oz  Weight (kg) 67.2 kg 68.7 kg 68.8 kg      Telemetry    Accelerated junctional rhythm with sinus bradycardia - Personally Reviewed  ECG    No new EKGs - Personally Reviewed  Physical Exam   Exam remains unchanged from yesterday GEN: No acute distress.   Neck: No JVD Cardiac: RRR, no murmurs, rubs, or gallops. Incision dry Respiratory: Clear to auscultation bilaterally. GI: Soft, nontender, non-distended  MS: No  edema; No deformity. Neuro:  Nonfocal  Psych: Normal affect   Labs    Chemistry Recent Labs  Lab 06/09/18 1637 06/10/18 0310 06/11/18 0351  NA 132* 131* 136  K 4.4 4.3 4.0  CL 100 98 102  CO2 GLUCOSE 169* 97 127*  BUN 16 24* 22  CREATININE 1.65* 1.72* 1.36*  CALCIUM 8.6* 8.6* 8.4*  GFRNONAA 44* 42* 55*  GFRAA 51* 48* >60  ANIONGAP Hematology Recent Labs  Lab 06/09/18 1637 06/10/18 0310 06/11/18 0351  WBC 16.4* 17.9* 13.2*  RBC 4.33 4.02* 3.72*  HGB 12.0* 11.3* 10.3*  HCT 36.9* 33.8* 31.1*  MCV 85.2 84.1 83.6  MCH 27.7 28.1 27.7  MCHC 32.5 33.4 33.1  RDW 14.0 13.7 13.7  PLT 145* 127* 116*    Cardiac EnzymesNo results for input(s): TROPONINI in the last 168 hours. No results for input(s): TROPIPOC in the last 168 hours.   BNPNo results for input(s): BNP, PROBNP in the last 168 hours.   DDimer No results for input(s): DDIMER in the last 168 hours.   Radiology    Dg Chest Port 1 View Result Date: 06/10/2018 CLINICAL DATA:  Status post AVR EXAM: PORTABLE CHEST 1 VIEW COMPARISON:  06/09/2018 FINDINGS: Mediastinal tube, pericardial drain and Swan-Ganz catheter have been removed. Right jugular sheath remains in place. Cardiac shadow is stable. Postsurgical changes are again noted. Small bilateral pleural effusions are noted with mild bibasilar atelectasis. IMPRESSION: Interval removal of tubes and lines as described. Mild pleural effusions with bibasilar atelectasis. Electronically Signed   By: Alcide Clever M.D.   On: 06/10/2018 07:09      Cardiac Studies   03/25/2018: TTE IMPRESSIONS 1. The left ventricle has mildly reduced systolic function, with an ejection fraction of 45-50%. The cavity size was mildly dilated. Left ventricular diastolic Doppler parameters are consistent with pseudonormalization. 2. The right ventricle has normal systolic function. The cavity was normal. There is no increase in right ventricular wall thickness. 3. Right  atrial size was mildly dilated. 4. Trivial pericardial effusion is present. 5. Mild thickening of the mitral valve leaflet. 6. The aortic valveis bicuspid Aortic valve regurgitation is severe by color flow Doppler. 7. The aortic valve appears to be a bicuspid or functionally bicuspid AV. There is severe AI. The aortic root is mildly dilated. 8. There is mild dilatation of the ascending aorta. 9. The aortic valve appears to be a bicuspid or functionally bicuspid AV. There is severe AI. The aortic root is mildly dilated . There is concern for a Type A aortic dissection. Suggest STAT CT angiogram of the chest for further evaluation.  10. The inferior vena cava was normal in size with <50% respiratory variability. 11. The interatrial septum was not assessed.  FINDINGS Left Ventricle: The left ventricle has mildly reduced systolic function, with an ejection fraction of 45-50%. The cavity size was mildly dilated. There is no increase in left ventricular wall thickness. Left ventricular diastolic Doppler parameters are  consistent with pseudonormalization. Right Ventricle: The right ventricle has normal systolic function. The cavity was normal. There is no increase in right ventricular wall thickness. Left Atrium: left atrial size was normal in size Right Atrium: right atrial size was mildly dilated. Right atrial pressure is estimated at 8 mmHg. Interatrial Septum: The interatrial septum was not assessed. Pericardium: Trivial pericardial effusion is present. Mitral Valve: The mitral valve is normal in structure. Mild thickening of the mitral valve leaflet. Mitral valve regurgitation is trivial by color flow Doppler. Tricuspid Valve: The tricuspid valve is normal in structure. Tricuspid valve regurgitation is mild by color flow Doppler. Aortic Valve: The aortic valveis bicuspid Aortic valve regurgitation is severe by color flow Doppler. The aortic valve appears to be a  bicuspid or functionally bicuspid AV. There is severe AI. The aortic root is mildly dilated.  Pulmonic Valve: The pulmonic valve was normal in structure. Pulmonic valve regurgitation is not visualized by color flow Doppler. Aorta: There is mild dilatation of the ascending aorta. The aortic valve appears to be a bicuspid or functionally bicuspid AV. There is severe AI. The aortic root is mildly dilated . There is concern for a Type A aortic dissection. Suggest STAT CT angiogram of the chest for further evaluation.   Patient Profile     63 y.o. male with a hx of LBBB, renal calculi only prior to his hospitalization in march after suffering anV. fib cardiac arrestat home. He was treated with CPR x3 minutes by family as well as defibrillation by EMS. He was intubated and treated with cooling protocol. He was subsequently extubated, w/u has noted no CAD, a bicuspid AV with dilated ascending AO/root, and severe  AI. LVEF by LV gram at cath 45-50%, by initial echo was the same 45-50% Also noted later in his stay incidentally to have a PE and started on Xarelto.  He was planned to discharge to rehab and return afterwards for AV surgery. EP saw him durng his stay with plans to discharge with lifevest and revisit ICD post AVR.  he is now in electively for AVR, and is POD #2 today  Assessment & Plan    1. Bicuspid AV with severe AI     Now s/p AVR (bioprosthetic) AORTIC VALVE REPLACEMENT (AVR), ON PUMP, USING MAGNA EASE AORTIC BIOPROSTHESIS VALVE (N/A)     POD #3   2. Inter-op PAFib    Amiodarone gtt >> PO 200mg  BID    On therapeutic lovenox post-op given h/o PE  His rhythm still in an accelerated junction with evidence of more sinus activity.  Rates are very similar though junction remains slightly faster Still unable to know what his AV conduction is  Temp wires have been removed   3. H/o VF arrest     has life vest at bedside        Hopefully his SR will pick up or his  junctional rate will slow and we will know his his AV conduction is today/soon, if he does NOT require pacing support, ideally would allow discharge again with his Life vest and plan for outpatient ICD      For questions or updates, please contact [facility] Please consult [email or web address] for contact info under  Signed, Sheilah Pigeon, PA-C  06/11/2018, 8:09 AM    EP Attending  Patient seen and examined. Agree with the findings as noted above. The patient is stable post op. Still looks like accelerated junctional rhythm with underlying sinus brady. His temp pacing leads have been removed. No atrial fib. He will continue bid amio and plan is as noted above. Life Vest for 4-6 weeks followed by ICD insertion therafter if he remains stable and incisions heal well.  Leonia Reeves.D.

## 2018-06-11 NOTE — Progress Notes (Signed)
Patient ID: Kevin Rojas, male   DOB: 1955-06-12, 63 y.o.   MRN: [id number] TCTS DAILY ICU PROGRESS NOTE                   [address]            [place] [address]          [phone]   3 Days Post-Op Procedure(s) (LRB): AORTIC VALVE REPLACEMENT (AVR), ON PUMP, USING MAGNA EASE AORTIC BIOPROSTHESIS VALVE (N/A) TRANSESOPHAGEAL ECHOCARDIOGRAM (TEE) (N/A)  Total Length of Stay:  LOS: 3 days   Subjective: Patient awake alert neurologically intact, up walking around the unit this morning  Objective: Vital signs in last 24 hours: Temp:  [98.4 F (36.9 C)-98.9 F (37.2 C)] 98.9 F (37.2 C) (06/04 0400) Pulse Rate:  [59-95] 87 (06/04 0600) Cardiac Rhythm: Junctional rhythm;Bundle branch block (06/03 2000) Resp:  [14-25] 25 (06/04 0600) BP: (76-130)/(57-105) 120/89 (06/04 0600) SpO2:  [89 %-100 %] 93 % (06/04 0600)  Filed Weights   06/08/18 0606 06/09/18 0500 06/10/18 0500  Weight: 70 kg 68.8 kg 68.7 kg    Weight change:    Hemodynamic parameters for last 24 hours:    Intake/Output from previous day: 06/03 0701 - 06/04 0700 In: 320 [P.O.:240; I.V.:80] Out: 885 [Urine:885]  Intake/Output this shift: Total I/O In: -  Out: 300 [Urine:300]  Current Meds: Scheduled Meds: . acetaminophen  1,000 mg Oral Q6H  . amiodarone  200 mg Oral BID  . aspirin EC  325 mg Oral Daily  . bisacodyl  10 mg Oral Daily  . Chlorhexidine Gluconate Cloth  6 each Topical Daily  . docusate sodium  200 mg Oral Daily  . enoxaparin (LOVENOX) injection  30 mg Subcutaneous Q12H  . insulin aspart  0-24 Units Subcutaneous Q4H  . metoCLOPramide (REGLAN) injection  10 mg Intravenous Q6H  . metoprolol tartrate  12.5 mg Oral BID   Or  . metoprolol tartrate  12.5 mg Per Tube BID  . pantoprazole  40 mg Oral Daily  . sodium chloride flush  3 mL Intravenous Q12H   Continuous Infusions: . sodium chloride    . lactated ringers    . nitroGLYCERIN 30 mcg/min (06/09/18 0500)    PRN Meds:.metoprolol tartrate, morphine injection, ondansetron (ZOFRAN) IV, oxyCODONE, sodium chloride flush, traMADol  General appearance: alert, cooperative and no distress Neurologic: intact Heart: regular rate and rhythm, S1, S2 normal, no murmur, click, rub or gallop Lungs: diminished breath sounds bibasilar Abdomen: soft, non-tender; bowel sounds normal; no masses,  no organomegaly Extremities: extremities normal, atraumatic, no cyanosis or edema and Homans sign is negative, no sign of DVT Wound: Sternum stable dressing intact  Lab Results: CBC: Recent Labs    06/10/18 0310 06/11/18 0351  WBC 17.9* 13.2*  HGB 11.3* 10.3*  HCT 33.8* 31.1*  PLT 127* 116*   BMET:  Recent Labs    06/10/18 0310 06/11/18 0351  NA 131* 136  K 4.3 4.0  CL 98 102  CO2 23 25  GLUCOSE 97 127*  BUN 24* 22  CREATININE 1.72* 1.36*  CALCIUM 8.6* 8.4*    CMET: Lab Results  Component Value Date   WBC 13.2 (H) 06/11/2018   HGB 10.3 (L) 06/11/2018   HCT 31.1 (L) 06/11/2018   PLT 116 (L) 06/11/2018   GLUCOSE 127 (H) 06/11/2018   ALT 16 06/03/2018   AST 15 06/03/2018   NA 136 06/11/2018   K 4.0 06/11/2018   CL  102 06/11/2018   CREATININE 1.36 (H) 06/11/2018   BUN 22 06/11/2018   CO2 25 06/11/2018   TSH 1.863 03/25/2018   INR 1.9 (H) 06/08/2018   HGBA1C 5.0 06/03/2018      PT/INR:  Recent Labs    06/08/18 1317  LABPROT 21.5*  INR 1.9*   Radiology: No results found.   Assessment/Plan: S/P Procedure(s) (LRB): AORTIC VALVE REPLACEMENT (AVR), ON PUMP, USING MAGNA EASE AORTIC BIOPROSTHESIS VALVE (N/A) TRANSESOPHAGEAL ECHOCARDIOGRAM (TEE) (N/A) Mobilize Diuresis Plan for transfer to step-down: see transfer orders Renal function returned to baseline Seen by EP to be discharged home with LifeVest in place for follow-up by EP later    Delight Ovens 06/11/2018 6:38 AM

## 2018-06-11 NOTE — Progress Notes (Signed)
Pt transferred to 4E-13 via wheelchair from 2H with SWOT RN. Pt walked to chair. Pt given CHG bath. Tele applied, CCMD notified- system down will verify once system back up. VSS. Will continue to monitor.  Theophilus Kinds, RN

## 2018-06-11 NOTE — Progress Notes (Signed)
CARDIAC REHAB PHASE I   Offered to walk with pt. Pt declining at this time. Will follow-up as time allows.  Reynold Bowen, RN BSN 06/11/2018 2:23 PM

## 2018-06-11 NOTE — Anesthesia Postprocedure Evaluation (Signed)
Anesthesia Post Note  Patient: Kevin Rojas  Procedure(s) Performed: AORTIC VALVE REPLACEMENT (AVR), ON PUMP, USING MAGNA EASE AORTIC BIOPROSTHESIS VALVE (N/A ) TRANSESOPHAGEAL ECHOCARDIOGRAM (TEE) (N/A )     Patient location during evaluation: SICU Anesthesia Type: General Level of consciousness: patient remains intubated per anesthesia plan Pain management: pain level controlled Vital Signs Assessment: post-procedure vital signs reviewed and stable Respiratory status: patient remains intubated per anesthesia plan Cardiovascular status: stable Postop Assessment: no apparent nausea or vomiting Anesthetic complications: no    Last Vitals:  Vitals:   06/11/18 1259 06/11/18 2054  BP: 98/75 93/72  Pulse: 87 (!) 108  Resp: (!) 23 (!) 31  Temp: 37.1 C 37.6 C  SpO2: 95% 99%    Last Pain:  Vitals:   06/11/18 2054  TempSrc: Oral  PainSc:                  Yoan Sallade

## 2018-06-11 NOTE — Progress Notes (Signed)
  Amiodarone Drug - Drug Interaction Consult Note  Recommendations: - Consider discontinuation of prn ondansetron while inpatient to decrease risk of QTc prolongation (no uses in the past 24 hours)  - Maintain potassium in normal physiologic range, especially with the use of diuretics (such as furosemide) - Monitor heart rate with use of beta blockers (such as metoprolol) - At discharge if continuing anticoagulation, monitor for signs/symptoms of bleeding. If choosing to initiate warfarin for this patient, consider lower starting dose (30-50% empiric dose reduction) and more frequent monitoring of INR as amiodarone can increase anticoagulant effects   Amiodarone is metabolized by the cytochrome P450 system and therefore has the potential to cause many drug interactions. Amiodarone has an average plasma half-life of 50 days (range 20 to 100 days).   There is potential for drug interactions to occur several weeks or months after stopping treatment and the onset of drug interactions may be slow after initiating amiodarone.   []  Statins: Increased risk of myopathy. Simvastatin- restrict dose to 20mg  daily. Other statins: counsel patients to report any muscle pain or weakness immediately.  [x]  Anticoagulants: Amiodarone can increase anticoagulant effect. Consider warfarin dose reduction. Patients should be monitored closely and the dose of anticoagulant altered accordingly, remembering that amiodarone levels take several weeks to stabilize.  []  Antiepileptics: Amiodarone can increase plasma concentration of phenytoin, the dose should be reduced. Note that small changes in phenytoin dose can result in large changes in levels. Monitor patient and counsel on signs of toxicity.  [x]  Beta blockers: increased risk of bradycardia, AV block and myocardial depression. Sotalol - avoid concomitant use.  []   Calcium channel blockers (diltiazem and verapamil): increased risk of bradycardia, AV block and  myocardial depression.  []   Cyclosporine: Amiodarone increases levels of cyclosporine. Reduced dose of cyclosporine is recommended.  []  Digoxin dose should be halved when amiodarone is started.  [x]  Diuretics: increased risk of cardiotoxicity if hypokalemia occurs.  []  Oral hypoglycemic agents (glyburide, glipizide, glimepiride): increased risk of hypoglycemia. Patient's glucose levels should be monitored closely when initiating amiodarone therapy.   []  Drugs that prolong the QT interval:  Torsades de pointes risk may be increased with concurrent use - avoid if possible.  Monitor QTc, also keep magnesium/potassium WNL if concurrent therapy can't be avoided. Marland Kitchen Antibiotics: e.g. fluoroquinolones, erythromycin. . Antiarrhythmics: e.g. quinidine, procainamide, disopyramide, sotalol. . Antipsychotics: e.g. phenothiazines, haloperidol.  . Lithium, tricyclic antidepressants, and methadone.  Thank You,   Harlow Mares, PharmD PGY1 Pharmacy Resident Phone [phone] 06/11/2018 10:24 AM

## 2018-06-12 ENCOUNTER — Inpatient Hospital Stay (HOSPITAL_COMMUNITY): Payer: BC Managed Care – PPO

## 2018-06-12 LAB — BASIC METABOLIC PANEL
Anion gap: 9 (ref 5–15)
BUN: 16 mg/dL (ref 8–23)
CO2: 27 mmol/L (ref 22–32)
Calcium: 8.7 mg/dL — ABNORMAL LOW (ref 8.9–10.3)
Chloride: 102 mmol/L (ref 98–111)
Creatinine, Ser: 1.34 mg/dL — ABNORMAL HIGH (ref 0.61–1.24)
GFR calc Af Amer: >60 mL/min (ref >60)
GFR calc non Af Amer: 56 mL/min — ABNORMAL LOW (ref >60)
Glucose, Bld: 127 mg/dL — ABNORMAL HIGH (ref 70–99)
Potassium: 3.7 mmol/L (ref 3.5–5.1)
Sodium: 138 mmol/L (ref 135–145)

## 2018-06-12 LAB — CBC
HCT: 32.8 % — ABNORMAL LOW (ref 39.0–52.0)
Hemoglobin: 10.8 g/dL — ABNORMAL LOW (ref 13.0–17.0)
MCH: 27.8 pg (ref 26.0–34.0)
MCHC: 32.9 g/dL (ref 30.0–36.0)
MCV: 84.3 fL (ref 80.0–100.0)
Platelets: 174 10*3/uL (ref 150–400)
RBC: 3.89 MIL/uL — ABNORMAL LOW (ref 4.22–5.81)
RDW: 13.8 % (ref 11.5–15.5)
WBC: 10.9 10*3/uL — ABNORMAL HIGH (ref 4.0–10.5)
nRBC: 0 % (ref 0.0–0.2)

## 2018-06-12 LAB — GLUCOSE, CAPILLARY
Glucose-Capillary: 77 mg/dL (ref 70–99)
Glucose-Capillary: 132 mg/dL — ABNORMAL HIGH (ref 70–99)
Glucose-Capillary: 90 mg/dL (ref 70–99)
Glucose-Capillary: 103 mg/dL — ABNORMAL HIGH (ref 70–99)

## 2018-06-12 LAB — MAGNESIUM: Magnesium: 2.2 mg/dL (ref 1.7–2.4)

## 2018-06-12 LAB — TSH: TSH: 0.92 u[IU]/mL (ref 0.350–4.500)

## 2018-06-12 MED ORDER — POTASSIUM CHLORIDE CRYS ER 20 MEQ PO TBCR
40.0000 meq | EXTENDED_RELEASE_TABLET | Freq: Once | ORAL | Status: AC
  Administered 2018-06-12: 40 meq via ORAL
  Filled 2018-06-12: qty 2

## 2018-06-12 NOTE — Progress Notes (Addendum)
Progress Note  Patient Name: Kevin Rojas Date of Encounter: 06/12/2018  Primary Cardiologist: Tonny Bollman, MD   Subjective   Unable to sleep well here, cant seem to get comfortable, otherwise doing ok, no CP, denies SOB  Inpatient Medications    Scheduled Meds: . amiodarone  200 mg Oral Q12H   Followed by  . [START ON 06/19/2018] amiodarone  200 mg Oral Daily  . aspirin EC  325 mg Oral Daily  . Chlorhexidine Gluconate Cloth  6 each Topical Daily  . enoxaparin (LOVENOX) injection  30 mg Subcutaneous Q12H  . insulin aspart  0-24 Units Subcutaneous TID AC & HS  . metoprolol tartrate  12.5 mg Oral BID  . pantoprazole  40 mg Oral QAC breakfast  . potassium chloride  40 mEq Oral Once  . sodium chloride flush  3 mL Intravenous Q12H   Continuous Infusions: . sodium chloride     PRN Meds: sodium chloride, acetaminophen, bisacodyl **OR** bisacodyl, guaiFENesin, ondansetron **OR** ondansetron (ZOFRAN) IV, oxyCODONE, sodium chloride flush, traMADol   Vital Signs    Vitals:   06/11/18 2336 06/12/18 0450 06/12/18 0825 06/12/18 0841  BP: 99/74 (!) 114/93 107/86   Pulse: (!) 102 89 (!) 105 100  Resp: (!) 27 16 19    Temp: 99.8 F (37.7 C) 98.7 F (37.1 C) 98.2 F (36.8 C)   TempSrc: Oral Oral Oral   SpO2: 99% 97% 95%   Weight:  66.5 kg    Height:        Intake/Output Summary (Last 24 hours) at 06/12/2018 0927 Last data filed at 06/12/2018 0846 Gross per 24 hour  Intake 250 ml  Output 1300 ml  Net -1050 ml   Last 3 Weights 06/12/2018 06/11/2018 06/10/2018  Weight (lbs) 146 lb 11.2 oz 148 lb 2.4 oz 151 lb 7.3 oz  Weight (kg) 66.543 kg 67.2 kg 68.7 kg      Telemetry    Accelerated junctional rhythm with sinus bradycardia - Personally Reviewed  ECG    No new EKGs - Personally Reviewed  Physical Exam   Exam remains essentially unchanged from yesterday GEN: No acute distress.   Neck: No JVD Cardiac: RRR, no murmurs, rubs, or gallops. Incision dry Respiratory:  diminished at the bases, otherwise clear b/l GI: Soft, nontender, non-distended  MS: No edema; No deformity. Neuro:  Nonfocal  Psych: Normal affect   Labs    Chemistry Recent Labs  Lab 06/10/18 0310 06/11/18 0351 06/12/18 0311  NA 131* 136 138  K 4.3 4.0 3.7  CL 98 102 102  CO2 23 25 27   GLUCOSE 97 127* 127*  BUN 24* 22 16  CREATININE 1.72* 1.36* 1.34*  CALCIUM 8.6* 8.4* 8.7*  GFRNONAA 42* 55* 56*  GFRAA 48* >60 >60  ANIONGAP 10 9 9      Hematology Recent Labs  Lab 06/10/18 0310 06/11/18 0351 06/12/18 0311  WBC 17.9* 13.2* 10.9*  RBC 4.02* 3.72* 3.89*  HGB 11.3* 10.3* 10.8*  HCT 33.8* 31.1* 32.8*  MCV 84.1 83.6 84.3  MCH 28.1 27.7 27.8  MCHC 33.4 33.1 32.9  RDW 13.7 13.7 13.8  PLT 127* 116* 174    Cardiac EnzymesNo results for input(s): TROPONINI in the last 168 hours. No results for input(s): TROPIPOC in the last 168 hours.   BNPNo results for input(s): BNP, PROBNP in the last 168 hours.   DDimer No results for input(s): DDIMER in the last 168 hours.   Radiology    Dg Chest [place] 1  View Result Date: 06/10/2018 CLINICAL DATA:  Status post AVR EXAM: PORTABLE CHEST 1 VIEW COMPARISON:  06/09/2018 FINDINGS: Mediastinal tube, pericardial drain and Swan-Ganz catheter have been removed. Right jugular sheath remains in place. Cardiac shadow is stable. Postsurgical changes are again noted. Small bilateral pleural effusions are noted with mild bibasilar atelectasis. IMPRESSION: Interval removal of tubes and lines as described. Mild pleural effusions with bibasilar atelectasis. Electronically Signed   By: Alcide Clever M.D.   On: 06/10/2018 07:09      Cardiac Studies   03/25/2018: TTE IMPRESSIONS 1. The left ventricle has mildly reduced systolic function, with an ejection fraction of 45-50%. The cavity size was mildly dilated. Left ventricular diastolic Doppler parameters are consistent with pseudonormalization. 2. The right ventricle has normal systolic function. The  cavity was normal. There is no increase in right ventricular wall thickness. 3. Right atrial size was mildly dilated. 4. Trivial pericardial effusion is present. 5. Mild thickening of the mitral valve leaflet. 6. The aortic valveis bicuspid Aortic valve regurgitation is severe by color flow Doppler. 7. The aortic valve appears to be a bicuspid or functionally bicuspid AV. There is severe AI. The aortic root is mildly dilated. 8. There is mild dilatation of the ascending aorta. 9. The aortic valve appears to be a bicuspid or functionally bicuspid AV. There is severe AI. The aortic root is mildly dilated . There is concern for a Type A aortic dissection. Suggest STAT CT angiogram of the chest for further evaluation.  10. The inferior vena cava was normal in size with <50% respiratory variability. 11. The interatrial septum was not assessed.  FINDINGS Left Ventricle: The left ventricle has mildly reduced systolic function, with an ejection fraction of 45-50%. The cavity size was mildly dilated. There is no increase in left ventricular wall thickness. Left ventricular diastolic Doppler parameters are  consistent with pseudonormalization. Right Ventricle: The right ventricle has normal systolic function. The cavity was normal. There is no increase in right ventricular wall thickness. Left Atrium: left atrial size was normal in size Right Atrium: right atrial size was mildly dilated. Right atrial pressure is estimated at 8 mmHg. Interatrial Septum: The interatrial septum was not assessed. Pericardium: Trivial pericardial effusion is present. Mitral Valve: The mitral valve is normal in structure. Mild thickening of the mitral valve leaflet. Mitral valve regurgitation is trivial by color flow Doppler. Tricuspid Valve: The tricuspid valve is normal in structure. Tricuspid valve regurgitation is mild by color flow Doppler. Aortic Valve: The aortic valveis bicuspid  Aortic valve regurgitation is severe by color flow Doppler. The aortic valve appears to be a bicuspid or functionally bicuspid AV. There is severe AI. The aortic root is mildly dilated.  Pulmonic Valve: The pulmonic valve was normal in structure. Pulmonic valve regurgitation is not visualized by color flow Doppler. Aorta: There is mild dilatation of the ascending aorta. The aortic valve appears to be a bicuspid or functionally bicuspid AV. There is severe AI. The aortic root is mildly dilated . There is concern for a Type A aortic dissection. Suggest STAT CT angiogram of the chest for further evaluation.   Patient Profile     63 y.o. male with a hx of LBBB, renal calculi only prior to his hospitalization in march after suffering anV. fib cardiac arrestat home. He was treated with CPR x3 minutes by family as well as defibrillation by EMS. He was intubated and treated with cooling protocol. He was subsequently extubated, w/u has noted no CAD, a bicuspid  AV with dilated ascending AO/root, and severe AI. LVEF by LV gram at cath 45-50%, by initial echo was the same 45-50% Also noted later in his stay incidentally to have a PE and started on Xarelto.  He was planned to discharge to rehab and return afterwards for AV surgery. EP saw him durng his stay with plans to discharge with lifevest and revisit ICD post AVR.  he is now in electively for AVR, and is POD #2 today  Assessment & Plan    1. Bicuspid AV with severe AI     Now s/p AVR (bioprosthetic) AORTIC VALVE REPLACEMENT (AVR), ON PUMP, USING MAGNA EASE AORTIC BIOPROSTHESIS VALVE 25MM (N/A)     POD #4   2. Inter-op PAFib    Amiodarone gtt >> PO 200mg  BID    On therapeutic lovenox post-op given h/o PE  He is in SR this AM, 70's-80's with 1:1 conduction On amiodarone and low dose lopressor I do not see any bradycardia  EP follow up is in place to discuss ICD implant timing. Continue life vest upon discharge   3. H/o VF  arrest     has life vest at bedside        4. Poor inspiratory effort     Encourage IS use      For questions or updates, please contact [facility] Please consult [email or web address] for contact info under  Signed, Sheilah PigeonRenee Lynn Ursuy, PA-C  06/12/2018, 9:27 AM    EP Attending  Agree with above. ICD insertion in approx 4-6 weeks. He has reverted back to NSR with no significant bradycardia.  Leonia ReevesGregg Calhoun Reichardt,M.D.

## 2018-06-12 NOTE — Progress Notes (Signed)
EPW removed per protocol. VSS. Tips intact. Pt educated on 1 hour of bedrest. Call light in reach. Will continue to monitor.  Versie Starks, RN

## 2018-06-12 NOTE — Progress Notes (Addendum)
[address]       [place] [address]             [phone]      4 Days Post-Op Procedure(s) (LRB): AORTIC VALVE REPLACEMENT (AVR), ON PUMP, USING MAGNA EASE AORTIC BIOPROSTHESIS VALVE (N/A) TRANSESOPHAGEAL ECHOCARDIOGRAM (TEE) (N/A) Subjective: Doing well , primary c/o is poor appetite and not sleeping well  Objective: Vital signs in last 24 hours: Temp:  [98.2 F (36.8 C)-99.8 F (37.7 C)] 98.2 F (36.8 C) (06/05 0825) Pulse Rate:  [69-108] 100 (06/05 0841) Cardiac Rhythm: Heart block;Bundle branch block (06/05 0700) Resp:  [16-31] 19 (06/05 0825) BP: (88-114)/(72-93) 107/86 (06/05 0825) SpO2:  [92 %-99 %] 95 % (06/05 0825) Weight:  [66.5 kg] 66.5 kg (06/05 0450)  Hemodynamic parameters for last 24 hours:    Intake/Output from previous day: 06/04 0701 - 06/05 0700 In: 310 [P.O.:310] Out: 1050 [Urine:1050] Intake/Output this shift: No intake/output data recorded.  General appearance: alert, cooperative and no distress Heart: regular rate and rhythm and soft systolic murmur Lungs: mildly diminished in bases Abdomen: benign Extremities: no edema Wound: incis healing well  Lab Results: Recent Labs    06/11/18 0351 06/12/18 0311  WBC 13.2* 10.9*  HGB 10.3* 10.8*  HCT 31.1* 32.8*  PLT 116* 174   BMET:  Recent Labs    06/11/18 0351 06/12/18 0311  NA 136 138  K 4.0 3.7  CL 102 102  CO2 25 27  GLUCOSE 127* 127*  BUN 22 16  CREATININE 1.36* 1.34*  CALCIUM 8.4* 8.7*    PT/INR: No results for input(s): LABPROT, INR in the last 72 hours. ABG    Component Value Date/Time   PHART 7.323 (L) 06/08/2018 1841   HCO3 21.2 06/08/2018 1841   TCO2 22 06/08/2018 1841   ACIDBASEDEF 5.0 (H) 06/08/2018 1841   O2SAT 100.0 06/08/2018 1841   CBG (last 3)  Recent Labs    06/11/18 1700 06/11/18 2124 06/12/18 0631  GLUCAP 92 112* 77    Meds Scheduled Meds: . amiodarone  200 mg Oral Q12H   Followed by  . [START ON 06/19/2018]  amiodarone  200 mg Oral Daily  . aspirin EC  325 mg Oral Daily  . Chlorhexidine Gluconate Cloth  6 each Topical Daily  . enoxaparin (LOVENOX) injection  30 mg Subcutaneous Q12H  . furosemide  20 mg Oral Daily  . insulin aspart  0-24 Units Subcutaneous TID AC & HS  . metoprolol tartrate  12.5 mg Oral BID  . pantoprazole  40 mg Oral QAC breakfast  . sodium chloride flush  3 mL Intravenous Q12H   Continuous Infusions: . sodium chloride     PRN Meds:.sodium chloride, acetaminophen, bisacodyl **OR** bisacodyl, guaiFENesin, ondansetron **OR** ondansetron (ZOFRAN) IV, oxyCODONE, sodium chloride flush, traMADol  Xrays Dg Chest Port 1 View  Result Date: 06/11/2018 CLINICAL DATA:  Follow-up cardiac surgery EXAM: PORTABLE CHEST 1 VIEW COMPARISON:  06/10/2010 FINDINGS: Postsurgical changes are again seen. Right jugular central line has been removed in the interval. Small right-sided pleural effusion is again seen. No pneumothorax is noted. Mild basilar atelectasis is seen. IMPRESSION: Postsurgical changes with small right effusion and bibasilar atelectasis. Electronically Signed   By: Alcide Clever M.D.   On: 06/11/2018 07:10    Assessment/Plan: S/P Procedure(s) (LRB): AORTIC VALVE REPLACEMENT (AVR), ON PUMP, USING MAGNA EASE AORTIC BIOPROSTHESIS VALVE (N/A) TRANSESOPHAGEAL ECHOCARDIOGRAM (TEE) (N/A)  1 overall conts to do well POD #4 2  hemodyn stable but SBP is relatively low in 88-114 range, on low dose metoprolol, cont to monitor for now but may need to stop 3 sinus rhythm- on amiodarone, QTc 504- conts 200 BID for now 4 good UOP, wt 3.5 kg below preop wt, creat stable at 1.3, no significant evid of volume overload, will d/c lasix 5 ABL anemia is stable 6 serum TSH is normal 7 leukocytosis resolved, no fevers 8 thrombocytopenia resolved 9 BS well controlled 10 on ASA, ? Restart xarelto for PE preop 11 d/c epw's 12 replace K+ 13 poss home 1-2 days  LOS: 4 days    Rowe Clack PA-C  06/12/2018 Pager [phone]  Poss home sat or Sunday Followed by ep  to go home with life vest  Resume xarelto on d/c home and pacing wires are out , on lovenox snd asa   I have seen and examined Kevin Rojas and agree with the above assessment  and plan.  Delight Ovens MD Beeper [phone] Office [phone] 06/12/2018 1:21 PM

## 2018-06-12 NOTE — Consult Note (Addendum)
   [facility] CM Inpatient Consult   06/12/2018  NICKLUS DOCKENDORF 15-Mar-1955 [id number]    Patient screened for 22% high risk score for unplanned readmission, with 4 hospitalizations and 1 ED visit in the past 6 months; and to check for potential [facility] care management service needs as benefit from his [facility] plan.  Review of medical record and history and physical dated 6/1//20 show as follows: Braian Merrigan a 63 y.o.malewith history of LBBB, renal calculi only prior to his hospitalization in march; recent out of hospital ventricular fibrillation cardiac arrest March 24, 2018 followed by prolonged hospitalization. Patient had a witnessed cardiac arrest and his wife performed bystander CPR until EMS arrived, promptly defibrillating patient and resuscitating him. He underwent therapeutic hypothermia per protocol. During his hospitalization, he was noted to have mild LV systolic dysfunction with dilatation of the left ventricle and severe aortic valve insufficiency. Cardiac catheterization demonstrated widely patent coronary arteries and normal hemodynamics. The patient slowly improved with respect to his anoxic encephalopathy and ultimately was discharged home on April 13, 2018 after inpatient rehab.  Had CT with contrast showing evidence of recent pulmonary emboli, as a result the patient was discharged home on Xarelto. Since discharge, he has his overall physical activity, has had no signs symptoms of covert infection, and has had no overt symptoms of congestive heart failure. He has seen Dr. Ladona Ridgel in regards to postop placement of pacemaker and/or defibrillator, he does have a left bundle branch block. (severe aortic insufficiency status post AVR-aortic valve replacement)  His primary care provider is Dr. [name] with [facility], listed as providing transition of care follow-up.  Patient transferred yesterday to stepdown unit (4E) for continued care from [facility]  (ICU).  Chart reviewed for progression and disposition. Patient is from home with spouse. Transition of care CM initial note indicates transitioning to home with spouse.  Plan: Will follow along for progress and disposition needs. Follow-up with inpatient [facility] team for any [facility] CM needs.  Of note, [facility] Care Management services does not replace or interfere with any services that are needed or arranged by inpatient case management or social work.    For additional questions or referrals please contact:  Karin Golden A. Shalin Vonbargen, BSN, RN-BC [facility] Liaison Cell: [phone]

## 2018-06-12 NOTE — Progress Notes (Addendum)
CARDIAC REHAB PHASE I   PRE:  Rate/Rhythm: 87 SR  BP:  Sitting: 111/89        SaO2: 94 %  MODE:  Ambulation: 240 ft   POST:  Rate/Rhythm: 102 SR  BP:  Sitting: 126/94      SaO2: 96 %  Pt ambulated with one assist and walker. Pt denied SOB or pain. Pt stated he was fatigued from not being able to sleep and legs felt weak. Pt educated on IS use, sternal precautions, incision care, restrictions, nutrition, and CRP II. Referral sent to [place] CRP II.   [id number]  Tomasita Crumble BS, ACSM CEP 06/12/2018  11:35 AM

## 2018-06-13 LAB — GLUCOSE, CAPILLARY
Glucose-Capillary: 106 mg/dL — ABNORMAL HIGH (ref 70–99)
Glucose-Capillary: 96 mg/dL (ref 70–99)

## 2018-06-13 MED ORDER — POTASSIUM CHLORIDE CRYS ER 20 MEQ PO TBCR: 20.0000 meq | EXTENDED_RELEASE_TABLET | Freq: Every day | ORAL | 0 refills | Status: DC

## 2018-06-13 MED ORDER — OXYCODONE HCL 5 MG PO TABS: 5.0000 mg | ORAL_TABLET | ORAL | 0 refills | Status: DC | PRN

## 2018-06-13 MED ORDER — POTASSIUM CHLORIDE CRYS ER 20 MEQ PO TBCR
20.0000 meq | EXTENDED_RELEASE_TABLET | Freq: Every day | ORAL | Status: DC
  Administered 2018-06-13: 20 meq via ORAL
  Filled 2018-06-13: qty 1

## 2018-06-13 MED ORDER — LACTULOSE 10 GM/15ML PO SOLN
20.0000 g | Freq: Once | ORAL | Status: AC
  Administered 2018-06-13: 20 g via ORAL
  Filled 2018-06-13: qty 30

## 2018-06-13 MED ORDER — FUROSEMIDE 40 MG PO TABS
40.0000 mg | ORAL_TABLET | Freq: Every day | ORAL | Status: DC
  Administered 2018-06-13: 10:00:00 40 mg via ORAL
  Filled 2018-06-13: qty 1

## 2018-06-13 MED ORDER — METOPROLOL TARTRATE 25 MG PO TABS: 12.5000 mg | ORAL_TABLET | Freq: Two times a day (BID) | ORAL | 1 refills | Status: DC

## 2018-06-13 MED ORDER — AMIODARONE HCL 200 MG PO TABS: 200.0000 mg | ORAL_TABLET | Freq: Every day | ORAL | 1 refills | Status: DC

## 2018-06-13 MED ORDER — ASPIRIN EC 81 MG PO TBEC: 81.0000 mg | DELAYED_RELEASE_TABLET | Freq: Every day | ORAL | Status: DC

## 2018-06-13 MED ORDER — FUROSEMIDE 40 MG PO TABS: 40.0000 mg | ORAL_TABLET | Freq: Every day | ORAL | 0 refills | Status: DC

## 2018-06-13 NOTE — Plan of Care (Signed)
Patient is adequate for discharge.  

## 2018-06-13 NOTE — Progress Notes (Signed)
CARDIAC REHAB PHASE I   PRE:  Rate/Rhythm: 92 SR    BP: sitting 116/93    SaO2: 95 RA  MODE:  Ambulation: 470 ft   POST:  Rate/Rhythm: 111 ST    BP: sitting 117/99     SaO2: 97 RA  Tolerated very well. Moving independent with RW in hall. To recliner, no c/o. DBP elevated. Reviewed education. Encouraged IS and x2 more walks today. [place], ACSM 06/13/2018 9:58 AM

## 2018-06-13 NOTE — Progress Notes (Addendum)
      [address]       [place] [address]             [phone]        5 Days Post-Op Procedure(s) (LRB): AORTIC VALVE REPLACEMENT (AVR), ON PUMP, USING MAGNA EASE AORTIC BIOPROSTHESIS VALVE 25MM (N/A) TRANSESOPHAGEAL ECHOCARDIOGRAM (TEE) (N/A)  Subjective: Patient slept well last night. He has not had a bowel movement since surgery.  Objective: Vital signs in last 24 hours: Temp:  [98 F (36.7 C)-99.3 F (37.4 C)] 98 F (36.7 C) (06/06 0802) Pulse Rate:  [87-105] 92 (06/06 0434) Cardiac Rhythm: Normal sinus rhythm;Bundle branch block (06/05 1906) Resp:  [16-29] 20 (06/06 0802) BP: (97-122)/(77-98) 114/90 (06/06 0802) SpO2:  [94 %-100 %] 100 % (06/06 0434) Weight:  [66.8 kg] 66.8 kg (06/06 0434)  Pre op weight 70 kg Current Weight  06/13/18 66.8 kg       Intake/Output from previous day: 06/05 0701 - 06/06 0700 In: 510 [P.O.:510] Out: 700 [Urine:700]   Physical Exam:  Cardiovascular: RRR, no murmur Pulmonary: Slightly diminished bibasilar breath sounds Abdomen: Soft, non tender, bowel sounds present. Extremities: Trace lower extremity edema. Wounds: Clean and dry.  No erythema or signs of infection.  Lab Results: CBC: Recent Labs    06/11/18 0351 06/12/18 0311  WBC 13.2* 10.9*  HGB 10.3* 10.8*  HCT 31.1* 32.8*  PLT 116* 174   BMET:  Recent Labs    06/11/18 0351 06/12/18 0311  NA 136 138  K 4.0 3.7  CL 102 102  CO2 25 27  GLUCOSE 127* 127*  BUN 22 16  CREATININE 1.36* 1.34*  CALCIUM 8.4* 8.7*    PT/INR:  Lab Results  Component Value Date   INR 1.9 (H) 06/08/2018   INR 1.2 06/08/2018   INR 1.4 (H) 06/03/2018   ABG:  INR: Will add last result for INR, ABG once components are confirmed Will add last 4 CBG results once components are confirmed  Assessment/Plan:  1. CV - Previous a fib. First degree heart block, SR in the 90's this am. On Lopressor 12.5 mg bid and Amiodarone 200 mg bid. QTc 487 so will decrease  Amiodarone to daily at discharge. Per Dr. Servando Snare, Xarelto to be resumed at discharge. EP following as had VF arrest on last admission and has Life Vest. Likely needs ICD after discharge;timing per EP 2.  Pulmonary - On room air. Encourage incentive spirometer 3.  Acute blood loss anemia - Last H and H 10.8 and 32.8 4. LOC constipation 5. As discussed with Dr. Roxan Hockey, will discharge later today if has bowel movement  Donielle M ZimmermanPA-C 06/13/2018,8:19 AM [phone]  Patient seen and examined, agree with above Dc later today  Remo Lipps C. Roxan Hockey, MD [facility] [phone]

## 2018-06-13 NOTE — Progress Notes (Signed)
Discharge instructions given to Kevin Rojas.  Discussed medication changes, new medications and side effects.  Discussed signs and symptoms to watch for and when to contact the physician.  Discussed follow up appointments.  Verbalized understanding.

## 2018-06-16 MED FILL — Sodium Bicarbonate IV Soln 8.4%: INTRAVENOUS | Qty: 50 | Status: AC

## 2018-06-16 MED FILL — Sodium Chloride IV Soln 0.9%: INTRAVENOUS | Qty: 3000 | Status: AC

## 2018-06-16 MED FILL — Magnesium Sulfate Inj 50%: INTRAMUSCULAR | Qty: 10 | Status: AC

## 2018-06-16 MED FILL — Electrolyte-R (PH 7.4) Solution: INTRAVENOUS | Qty: 3000 | Status: AC

## 2018-06-16 MED FILL — Mannitol IV Soln 20%: INTRAVENOUS | Qty: 500 | Status: AC

## 2018-06-16 MED FILL — Heparin Sodium (Porcine) Inj 1000 Unit/ML: INTRAMUSCULAR | Qty: 30 | Status: AC

## 2018-06-16 MED FILL — Heparin Sodium (Porcine) Inj 1000 Unit/ML: INTRAMUSCULAR | Qty: 10 | Status: AC

## 2018-06-16 MED FILL — Potassium Chloride Inj 2 mEq/ML: INTRAVENOUS | Qty: 40 | Status: AC

## 2018-06-16 MED FILL — Lidocaine HCl Local Soln Prefilled Syringe 100 MG/5ML (2%): INTRAMUSCULAR | Qty: 5 | Status: AC

## 2018-06-19 ENCOUNTER — Telehealth: Payer: Self-pay | Admitting: Internal Medicine

## 2018-06-19 NOTE — Telephone Encounter (Signed)
Call returned to Pt.    Spoke with wife.  Pt had a 10 minute period of dizziness, broke out in a cold sweat.  We reviewed Pt medication.  Pt took his amiodarone in the morning.  Then at lunch he took his metoprolol and lasix.  Upon review, Pt should have discontinued lasix on June 10, but per wife their niece filled his medication box and she had continued to give him the lasix.  Wife states Pt systolic blood pressures have been running in the 90-low 100's.    Confirmed Pt is wearing his lifevest.  Advised wife if Pt had a dysrhythmia his vest should catch that and notify the office.  Since nothing has been received at this time Pt could have had hypotensive episode.  Advised wife/Pt to be vigilant this weekend. If any further episodes call the on call center.  If Pt does have episodes please call this nurse back Monday morning to discuss.  Pt/wife indicate understanding.  Thanked nurse for call.

## 2018-06-19 NOTE — Telephone Encounter (Signed)
Pt c/o medication issue:  1. Name of Medication: AMIODARONE  200 mg   2. How are you currently taking this medication (dosage and times per day)? 1 daily   3. Are you having a reaction (difficulty breathing--STAT) no did yesterday and today  4. What is your medication issue?  Cold sweat and dizziness and heart felt out of rethum  This lasted for about 10 min.

## 2018-06-23 ENCOUNTER — Telehealth: Payer: Self-pay

## 2018-06-23 NOTE — Progress Notes (Signed)
Virtual Visit via Telephone Note   This visit type was conducted due to national recommendations for restrictions regarding the COVID-19 Pandemic (e.g. social distancing) in an effort to limit this patient's exposure and mitigate transmission in our community.  Due to his co-morbid illnesses, this patient is at least at moderate risk for complications without adequate follow up.  This format is felt to be most appropriate for this patient at this time.  The patient did not have access to video technology/had technical difficulties with video requiring transitioning to audio format only (telephone).  All issues noted in this document were discussed and addressed.  No physical exam could be performed with this format.  Please refer to the patient's chart for his  consent [facility].   Date:  06/24/2018   ID:  Kevin Rojas Humphries, DOB June 18, 1955, MRN [id number]  Patient Location: Home Provider Location: Home  PCP:  Merri BrunetteSmith, Candace, MD  Cardiologist:  Tonny BollmanMichael Cooper, MD   Electrophysiologist:  Lewayne BuntingGregg Taylor, MD   Evaluation Performed:  Follow-Up Visit  Chief Complaint:  Post hospital follow up s/p AVR  History of Present Illness:    Kevin Rojas Bolick is a 63 y.o. male with bicuspid aortic valve with severe aortic insufficiency, nonischemic cardiomyopathy, left bundle branch block, dilated Ao root (4.2 cm on CT in 06/2018) and ascending Aorta (4.3 cm).  He was admitted in March 2020 with out of hospital VF arrest.  He was treated with bystander CPR and had a prolonged hospitalization.  During this hospitalization, he was noted to have severe aortic insufficiency with bicuspid aortic valve and mild LV dysfunction.  Cardiac catheterization demonstrated normal coronary arteries.  He was discharged on a LifeVest.  Work-up for aortic valve surgery included a chest CT which demonstrated an incidental finding of a R subsegmental nonocclusive pulmonary embolism.  He was tx with Lovenox >>  Xarelto.  He recovered from his anoxic encephalopathy and ultimately underwent bioprosthetic aortic valve replacement with Dr. Tyrone SageGerhardt 06/08/2018 (DC on 06/13/2018).  He had intraoperative atrial fibrillation which was managed with Amiodarone.  He was DC on a LifeVest.  He has seen Dr. Ladona Ridgelaylor in the past who recommended probable ICD insertion after his aortic valve surgery.    Today, he notes he has been doing well.  He is still somewhat sore.  He is still sleeping in a recliner.  He has not had significant shortness of breath.  He has not had paroxysmal nocturnal dyspnea.  He has not had lower extremity swelling.  His blood pressure was running low but he was still taking Lasix.  That has been stopped.  He still gets dizzy at times when he stands up quickly.  He has not had syncope.  He has not had any fever.  His appetite is good.  The patient does not have symptoms concerning for COVID-19 infection (fever, chills, cough, or new shortness of breath).    Past Medical History:  Diagnosis Date  . Aortic insufficiency   . Cardiac arrest (HCC) 03/24/2018  . Dysrhythmia   . History of kidney stones   . Pneumonia    history of  . Pulmonary embolism (HCC)    04/04/18  . Thoracic aortic aneurysm (HCC) 06/24/2018   CT 03/2018:  Ao root 4.2 cm; ascending Aorta 4.3 cm   Past Surgical History:  Procedure Laterality Date  . AORTIC ARCH ANGIOGRAPHY N/A 04/02/2018   Procedure: AORTIC ARCH ANGIOGRAPHY;  Surgeon: Lennette BihariKelly, Thomas A, MD;  [facility];  Service: Cardiovascular;  Laterality: N/A;  . AORTIC VALVE REPLACEMENT N/A 06/08/2018   Procedure: AORTIC VALVE REPLACEMENT (AVR), ON PUMP, USING MAGNA EASE AORTIC BIOPROSTHESIS VALVE ;  Surgeon: Delight Ovens, MD;  Location: [facility] OR;  Service: Open Heart Surgery;  Laterality: N/A;  . RIGHT/LEFT HEART CATH AND CORONARY ANGIOGRAPHY N/A 04/02/2018   Procedure: RIGHT/LEFT HEART CATH AND CORONARY ANGIOGRAPHY;  Surgeon: Lennette Bihari, MD;  Location:  [facility] INVASIVE CV LAB;  Service: Cardiovascular;  Laterality: N/A;  . TEE WITHOUT CARDIOVERSION N/A 06/08/2018   Procedure: TRANSESOPHAGEAL ECHOCARDIOGRAM (TEE);  Surgeon: Delight Ovens, MD;  Location: [facility] OR;  Service: Open Heart Surgery;  Laterality: N/A;     Current Meds  Medication Sig  . acetaminophen (TYLENOL) 500 MG tablet Take 500 mg by mouth every 6 (six) hours as needed for moderate pain or headache.  Marland Kitchen amiodarone (PACERONE) 200 MG tablet Take 1 tablet (200 mg total) by mouth daily.  . metoCLOPramide (REGLAN) 5 MG tablet Take 1 tablet (5 mg total) by mouth 4 (four) times daily -  before meals and at bedtime.  . metoprolol tartrate (LOPRESSOR) 25 MG tablet Take 0.5 tablets (12.5 mg total) by mouth 2 (two) times daily.  Marland Kitchen oxyCODONE (OXY IR/ROXICODONE) 5 MG immediate release tablet Take 1 tablet (5 mg total) by mouth every 4 (four) hours as needed for severe pain.  . rivaroxaban (XARELTO) 20 MG TABS tablet Take 20 mg by mouth daily with supper.  . senna-docusate (SENOKOT-S) 8.6-50 MG tablet Take 1 tablet by mouth at bedtime as needed for mild constipation.     Allergies:   Patient has no known allergies.   Social History   Tobacco Use  . Smoking status: Never Smoker  . Smokeless tobacco: Never Used  Substance Use Topics  . Alcohol use: Never    Frequency: Never  . Drug use: Never     Family Hx: The patient's family history includes Prostate cancer in his father; Pulmonary embolism in his mother.  ROS:   Please see the history of present illness.     All other systems reviewed and are negative.   Prior CV studies:   The following studies were reviewed today:   Carotid US 04/02/18 Bilat ICA 1-39  R/Rojas Cardiac Catheterization 04/02/18 Normal right heart pressures. Mild global LV dysfunction with an EF 45 to 50%. Bicuspid aortic valve with severe 4+ aortic insufficiency, mild aortic stenosis. Dilated aortic root. Normal coronary arteries and a left dominant  circulation.  Chest/Abd/Pelvic CTA 04/04/2018 IMPRESSION: 1. New, acute subsegmental nonocclusive pulmonary embolism in branches of the right lower lobe and right middle lobe pulmonary arteries. Overall volume of thrombus burden is low. This thrombus was not present on the CTA dated 03/24/2018. 2. Mild aneurysmal disease involving the aortic root and ascending thoracic aorta with the root measuring 4.2 cm in greatest diameter and the ascending thoracic aorta measuring 4.3 cm in greatest diameter. Associated partially calcified aortic valve. The aortic arch is very tortuous distally and demonstrates some degree of focal kinking due to tortuosity without evidence of significant stenosis. No evidence of aortic dissection. 3. Left ventricular cavity dilatation. 4. No evidence of aneurysmal disease or significant obstructive disease involving the abdominal aorta, iliac arteries, common femoral arteries or visceral branches of the abdominal aorta.  Echo 03/25/18 EF 45-50, Gr 2 DD, mild RAE, trivial pericardial eff, bicuspid AoV, severe AI, mildly dilated Ao root    Labs/Other Tests and Data Reviewed:  EKG:  No ECG reviewed.  Recent Labs: 06/03/2018: ALT 16 06/12/2018: BUN 16; Creatinine, Ser 1.34; Hemoglobin 10.8; Magnesium 2.2; Platelets 174; Potassium 3.7; Sodium 138; TSH 0.920   Recent Lipid Panel No results found for: CHOL, TRIG, HDL, CHOLHDL, LDLCALC, LDLDIRECT  Wt Readings from Last 3 Encounters:  06/24/18 142 lb 12.8 oz (64.8 kg)  06/13/18 147 lb 4.3 oz (66.8 kg)  06/03/18 154 lb 6.4 oz (70 kg)     Objective:    Vital Signs:  BP 97/79   Pulse (!) 53   Temp 98.2 F (36.8 C)   Ht 5\' 9"  (1.753 m)   Wt 142 lb 12.8 oz (64.8 kg)   BMI 21.09 kg/m    VITAL SIGNS:  reviewed GEN:  no acute distress RESPIRATORY:  No labored breathing NEURO:  Alert and oriented PSYCH:  Normal mood  ASSESSMENT & PLAN:     Severe aortic insufficiency / S/P AVR (aortic valve replacement)  -  Plan: He is recovering nicely from his aortic valve replacement.  He will need a follow-up echo.  I will arrange this in about 2 months.  I will have him see Dr. Burt Knack in 3 months.  Continue aspirin.  Continue SBE prophylaxis.  History of cardiac arrest -  Plan: He remains on LifeVest.  He has an appointment with Dr. Lovena Le next week to discuss ICD implantation.  Paroxysmal atrial fibrillation He had intraoperative atrial fibrillation and was managed with amiodarone.  He had evidence of accelerated junctional rhythm as well.  He has had some dizziness with standing and his heart rate seem to be in the 40s and 50s on his blood pressure machine.  I have asked him to reduce his metoprolol to 12.5 mg every evening until he sees Dr. Lovena Le next week.  If his ECG continues to demonstrate significant bradycardia, we may be able to stop his beta-blocker altogether.  I suspect his amiodarone can likely be stopped once he is 3 months out from his surgery.  Pulmonary embolism, other, unspecified chronicity, unspecified whether acute cor pulmonale present ([place]) -  Plan: Continue Xarelto.  NICM (nonischemic cardiomyopathy) ([place]) -  Plan: EF 45-50 by echo March 2020.  He will have a follow-up echo arranged in the next 2 months post aortic valve replacement.  His blood pressure is too low to add ARB/ACE inhibitor.  Thoracic aortic aneurysm without rupture ([place]) -  Plan: 4.3 cm by chest CT March 2020.  LBBB (left bundle branch block) -  Plan: As noted, he has an appointment with Dr. Lovena Le next week to decide on ICD +/- CRT.  COVID-19 Education: The signs and symptoms of COVID-19 were discussed with the patient and how to seek care for testing (follow up with PCP or arrange E-visit).  The importance of social distancing was discussed today.  Time:   Today, I have spent 17 minutes with the patient with telehealth technology discussing the above problems.     Medication Adjustments/Labs and Tests  Ordered: Current medicines are reviewed at length with the patient today.  Concerns regarding medicines are outlined above.   Tests Ordered: Orders Placed This Encounter  Procedures  . ECHOCARDIOGRAM COMPLETE    Medication Changes: No orders of the defined types were placed in this encounter.   Follow Up:  In Person June 23 with Dr. Lovena Le as scheduled; Dr. Burt Knack in 3 months  Signed, Richardson Dopp, PA-C  06/24/2018 5:32 PM    [place]

## 2018-06-23 NOTE — Telephone Encounter (Signed)

## 2018-06-24 ENCOUNTER — Encounter: Payer: Self-pay | Admitting: Physician Assistant

## 2018-06-24 ENCOUNTER — Telehealth (INDEPENDENT_AMBULATORY_CARE_PROVIDER_SITE_OTHER): Payer: BC Managed Care – PPO | Admitting: Physician Assistant

## 2018-06-24 ENCOUNTER — Other Ambulatory Visit: Payer: Self-pay

## 2018-06-24 VITALS — BP 97/79 | HR 53 | Temp 98.2°F | Ht 69.0 in | Wt 142.8 lb

## 2018-06-24 DIAGNOSIS — I428 Other cardiomyopathies: Secondary | ICD-10-CM

## 2018-06-24 DIAGNOSIS — I447 Left bundle-branch block, unspecified: Secondary | ICD-10-CM

## 2018-06-24 DIAGNOSIS — I2699 Other pulmonary embolism without acute cor pulmonale: Secondary | ICD-10-CM

## 2018-06-24 DIAGNOSIS — I712 Thoracic aortic aneurysm, without rupture, unspecified: Secondary | ICD-10-CM

## 2018-06-24 DIAGNOSIS — Z8674 Personal history of sudden cardiac arrest: Secondary | ICD-10-CM

## 2018-06-24 DIAGNOSIS — I351 Nonrheumatic aortic (valve) insufficiency: Secondary | ICD-10-CM

## 2018-06-24 DIAGNOSIS — I480 Paroxysmal atrial fibrillation: Secondary | ICD-10-CM

## 2018-06-24 DIAGNOSIS — Z952 Presence of prosthetic heart valve: Secondary | ICD-10-CM

## 2018-06-24 DIAGNOSIS — Z7189 Other specified counseling: Secondary | ICD-10-CM

## 2018-06-24 HISTORY — DX: Thoracic aortic aneurysm, without rupture, unspecified: I71.20

## 2018-06-24 HISTORY — DX: Thoracic aortic aneurysm, without rupture: I71.2

## 2018-06-24 NOTE — Patient Instructions (Addendum)
Medication Instructions:   Start metoprolol tartrate to 12.5 mg once daily in the evening   Start back taking Xarelto 20 mg daily  If you need a refill on your cardiac medications before your next appointment, please call your pharmacy.    Lab work:NONE ORDERED  TODAY If you have labs (blood work) drawn today and your tests are completely normal, you will receive your results only by: Marland Kitchen MyChart Message (if you have MyChart) OR . A paper copy in the mail If you have any lab test that is abnormal or we need to change your treatment, we will call you to review the results.  Testing/Procedures:Your physician has requested that you have an echocardiogram. Echocardiography is a painless test that uses sound waves to create images of your heart. It provides your doctor with information about the size and shape of your heart and how well your heart's chambers and valves are working. This procedure takes approximately one hour. There are no restrictions for this procedure.  Follow-Up:At [facility], you and your health needs are our priority.  As part of our continuing mission to provide you with exceptional heart care, we have created designated Provider Care Teams.  These Care Teams include your primary Cardiologist (physician) and Advanced Practice Providers (APPs -  Physician Assistants and Nurse Practitioners) who all work together to provide you with the care you need, when you need it.You will need a follow up appointment in:  3 months.  You may see Sherren Mocha, MD   Any Other Special Instructions Will Be Listed Below (If Applicable)

## 2018-06-30 ENCOUNTER — Ambulatory Visit: Payer: BC Managed Care – PPO | Admitting: Internal Medicine

## 2018-06-30 ENCOUNTER — Telehealth: Payer: Self-pay | Admitting: Internal Medicine

## 2018-06-30 ENCOUNTER — Other Ambulatory Visit: Payer: Self-pay

## 2018-06-30 ENCOUNTER — Encounter: Payer: Self-pay | Admitting: Internal Medicine

## 2018-06-30 VITALS — BP 116/82 | HR 58 | Ht 69.0 in | Wt 146.0 lb

## 2018-06-30 DIAGNOSIS — I469 Cardiac arrest, cause unspecified: Secondary | ICD-10-CM | POA: Diagnosis not present

## 2018-06-30 DIAGNOSIS — I447 Left bundle-branch block, unspecified: Secondary | ICD-10-CM | POA: Diagnosis not present

## 2018-06-30 LAB — CBC WITH DIFFERENTIAL/PLATELET
Basophils Absolute: 0 10*3/uL (ref 0.0–0.2)
Basos: 1 %
EOS (ABSOLUTE): 0.3 10*3/uL (ref 0.0–0.4)
Eos: 5 %
Hematocrit: 36.4 % — ABNORMAL LOW (ref 37.5–51.0)
Hemoglobin: 11.4 g/dL — ABNORMAL LOW (ref 13.0–17.7)
Immature Grans (Abs): 0 10*3/uL (ref 0.0–0.1)
Immature Granulocytes: 0 %
Lymphocytes Absolute: 1.2 10*3/uL (ref 0.7–3.1)
Lymphs: 20 %
MCH: 26.3 pg — ABNORMAL LOW (ref 26.6–33.0)
MCHC: 31.3 g/dL — ABNORMAL LOW (ref 31.5–35.7)
MCV: 84 fL (ref 79–97)
Monocytes Absolute: 0.5 10*3/uL (ref 0.1–0.9)
Monocytes: 8 %
Neutrophils Absolute: 3.8 10*3/uL (ref 1.4–7.0)
Neutrophils: 66 %
Platelets: 471 10*3/uL — ABNORMAL HIGH (ref 150–450)
RBC: 4.34 x10E6/uL (ref 4.14–5.80)
RDW: 13 % (ref 11.6–15.4)
WBC: 5.8 10*3/uL (ref 3.4–10.8)

## 2018-06-30 LAB — BASIC METABOLIC PANEL
BUN/Creatinine Ratio: 9 — ABNORMAL LOW (ref 10–24)
BUN: 12 mg/dL (ref 8–27)
CO2: 24 mmol/L (ref 20–29)
Calcium: 9.8 mg/dL (ref 8.6–10.2)
Chloride: 102 mmol/L (ref 96–106)
Creatinine, Ser: 1.38 mg/dL — ABNORMAL HIGH (ref 0.76–1.27)
GFR calc Af Amer: 63 mL/min/{1.73_m2} (ref >59)
GFR calc non Af Amer: 54 mL/min/{1.73_m2} — ABNORMAL LOW (ref >59)
Glucose: 93 mg/dL (ref 65–99)
Potassium: 4.9 mmol/L (ref 3.5–5.2)
Sodium: 138 mmol/L (ref 134–144)

## 2018-06-30 NOTE — H&P (View-Only) (Signed)
HPI Kevin Rojas returns today for followup. He is a pleasant 63 yo man with a h/o VF arrest, AI, s/p AVR who returns today for followup. He has done well after his valve replacement. 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 He has minimal incisional pain. No edema.  He has known LBBB with a long first degree AV block and an EF of 35% by echo. He has class 2 CHF. His QRS duration is 154 ms. No Known Allergies   Current Outpatient Medications  Medication Sig Dispense Refill  . amiodarone (PACERONE) 200 MG tablet Take 1 tablet (200 mg total) by mouth daily. 30 tablet 1  . aspirin EC 81 MG tablet Take 1 tablet (81 mg total) by mouth daily.    . metoprolol tartrate (LOPRESSOR) 25 MG tablet Take 0.5 tablets (12.5 mg total) by mouth 2 (two) times daily. 30 tablet 1  . oxyCODONE (OXY IR/ROXICODONE) 5 MG immediate release tablet Take 1 tablet (5 mg total) by mouth every 4 (four) hours as needed for severe pain. 30 tablet 0  . rivaroxaban (XARELTO) 20 MG TABS tablet Take 20 mg by mouth daily with supper.    . senna-docusate (SENOKOT-S) 8.6-50 MG tablet Take 1 tablet by mouth at bedtime as needed for mild constipation. 30 tablet 1  . metoCLOPramide (REGLAN) 5 MG tablet Take 1 tablet (5 mg total) by mouth 4 (four) times daily -  before meals and at bedtime. 120 tablet 1   No current facility-administered medications for this visit.      Past Medical History:  Diagnosis Date  . Aortic insufficiency   . Cardiac arrest ([place]) 03/24/2018  . Dysrhythmia   . History of kidney stones   . Pneumonia    history of  . Pulmonary embolism ([place])    04/04/18  . Thoracic aortic aneurysm ([place]) 06/24/2018   CT 03/2018:  Ao root 4.2 cm; ascending Aorta 4.3 cm    ROS:   All systems reviewed and negative except as noted in the HPI.   Past Surgical History:  Procedure Laterality Date  . AORTIC ARCH ANGIOGRAPHY N/A 04/02/2018   Procedure: AORTIC ARCH ANGIOGRAPHY;  Surgeon: Troy Sine, MD;  Location:  [facility] CV LAB;  Service: Cardiovascular;  Laterality: N/A;  . AORTIC VALVE REPLACEMENT N/A 06/08/2018   Procedure: AORTIC VALVE REPLACEMENT (AVR), ON PUMP, USING MAGNA EASE AORTIC BIOPROSTHESIS VALVE 25MM;  Surgeon: Grace Isaac, MD;  Location: [place];  Service: Open Heart Surgery;  Laterality: N/A;  . RIGHT/LEFT HEART CATH AND CORONARY ANGIOGRAPHY N/A 04/02/2018   Procedure: RIGHT/LEFT HEART CATH AND CORONARY ANGIOGRAPHY;  Surgeon: Troy Sine, MD;  Location: [place] CV LAB;  Service: Cardiovascular;  Laterality: N/A;  . TEE WITHOUT CARDIOVERSION N/A 06/08/2018   Procedure: TRANSESOPHAGEAL ECHOCARDIOGRAM (TEE);  Surgeon: Grace Isaac, MD;  Location: [place];  Service: Open Heart Surgery;  Laterality: N/A;     Family History  Problem Relation Age of Onset  . Pulmonary embolism Mother   . Prostate cancer Father        passed awary at 60     Social History   Socioeconomic History  . Marital status: Married    Spouse name: Not on file  . Number of children: Not on file  . Years of education: Not on file  . Highest education level: Not on file  Occupational History  . Not on file  Social Needs  . Financial resource strain:  Not on file  . Food insecurity    Worry: Not on file    Inability: Not on file  . Transportation needs    Medical: Not on file    Non-medical: Not on file  Tobacco Use  . Smoking status: Never Smoker  . Smokeless tobacco: Never Used  Substance and Sexual Activity  . Alcohol use: Never    Frequency: Never  . Drug use: Never  . Sexual activity: Not on file  Lifestyle  . Physical activity    Days per week: Not on file    Minutes per session: Not on file  . Stress: Not on file  Relationships  . Social connections    Talks on phone: Not on file    Gets together: Not on file    Attends religious service: Not on file    Active member of club or organization: Not on file    Attends meetings of clubs or organizations: Not on file     Relationship status: Not on file  . Intimate partner violence    Fear of current or ex partner: Not on file    Emotionally abused: Not on file    Physically abused: Not on file    Forced sexual activity: Not on file  Other Topics Concern  . Not on file  Social History Narrative  . Not on file     BP 116/82   Pulse (!) 58   Ht 5' 9" (1.753 m)   Wt 146 lb (66.2 kg)   SpO2 98%   BMI 21.56 kg/m   Physical Exam:  Well appearing 62 yo man, NAD HEENT: Unremarkable Neck:  No JVD, no thyromegally Lymphatics:  No adenopathy Back:  No CVA tenderness Lungs:  No increased work of breathing HEART:  RRR Abd:  Non-distended. Ext:  2 plus pulses, no edema, no cyanosis, no clubbing Skin:  No rashes no nodules Neuro:  CN II through XII intact, motor grossly intact  EKG - nsr with first degree AV block and LBBB   Assess/Plan: 1. VF arrest - I have discussed the indications/risks/benefits/goals/expectations of ICD insertion with the patient and he will call us to proceed. His indication is secondary prevention. 2. Non-ischemic CM - his EF is 35%. He has class 2 symptoms. He will call us when he wishes to proceed with ICD. With LBBB and QRS of 154 he will undergo biv ICD insertion 3. AI - he is s/p valve replacement. He has healed up well.  Sae Handrich,M.D. 

## 2018-06-30 NOTE — Telephone Encounter (Signed)
New message   Patient's wife has question about when the patient can start driving? Please advise.

## 2018-06-30 NOTE — Patient Instructions (Addendum)
Medication Instructions:  Your physician recommends that you continue on your current medications as directed. Please refer to the Current Medication list given to you today.  Labwork: You will get lab work today:  CBC and BMP  Testing/Procedures: None ordered.  Follow-Up: Contact me when you are ready to schedule your procedure.  The following days are available:  July 1, 8, 10, 13, 15, 20, 21, 23    Your covid test will be at the [facility] [address]. I will schedule this test when you decide what day you would like to have your procedure.   Procedure instructions:  Please arrive to ADMITTING down the hall from the [place] main entrance of [facility] hospital at:  xxx  Use the CHG surgical scrub the night before and morning of your procedure  Do not eat or drink after midnight prior to procedure  DO NOT [place] for 2 days prior to your procedure.  You may take all your other normal morning medications on the morning of your procedure  Plan for one night stay  You will need someone to drive you home at discharge    Cardioverter Defibrillator Implantation  An implantable cardioverter defibrillator (ICD) is a small device that is placed under the skin in the chest or abdomen. An ICD consists of a battery, a small computer (pulse generator), and wires (leads) that go into the heart. An ICD is used to detect and correct two types of dangerous irregular heartbeats (arrhythmias):  A rapid heart rhythm (tachycardia).  An arrhythmia in which the lower chambers of the heart (ventricles) contract in an uncoordinated way (fibrillation). When an ICD detects tachycardia, it sends a low-energy shock to the heart to restore the heartbeat to normal (cardioversion). This signal is usually painless. If cardioversion does not work or if the ICD detects fibrillation, it delivers a high-energy shock to the heart (defibrillation) to restart the heart. This  shock may feel like a strong jolt in the chest. Your health care provider may prescribe an ICD if:  You have had an arrhythmia that originated in the ventricles.  Your heart has been damaged by a disease or heart condition. Sometimes, ICDs are programmed to act as a device called a pacemaker. Pacemakers can be used to treat a slow heartbeat (bradycardia) or tachycardia by taking over the heart rate with electrical impulses. Tell a health care provider about:  Any allergies you have.  All medicines you are taking, including vitamins, herbs, eye drops, creams, and over-the-counter medicines.  Any problems you or family members have had with anesthetic medicines.  Any blood disorders you have.  Any surgeries you have had.  Any medical conditions you have.  Whether you are pregnant or may be pregnant. What are the risks? Generally, this is a safe procedure. However, problems may occur, including:  Swelling, bleeding, or bruising.  Infection.  Blood clots.  Damage to other structures or organs, such as nerves, blood vessels, or the heart.  Allergic reactions to medicines used during the procedure. What happens before the procedure? Staying hydrated Follow instructions from your health care provider about hydration, which may include:  Up to 2 hours before the procedure - you may continue to drink clear liquids, such as water, clear fruit juice, black coffee, and plain tea. Eating and drinking restrictions Follow instructions from your health care provider about eating and drinking, which may include:  8 hours before the procedure - stop eating heavy meals  or foods such as meat, fried foods, or fatty foods.  6 hours before the procedure - stop eating light meals or foods, such as toast or cereal.  6 hours before the procedure - stop drinking milk or drinks that contain milk.  2 hours before the procedure - stop drinking clear liquids. Medicine Ask your health care provider  about:  Changing or stopping your normal medicines. This is important if you take diabetes medicines or blood thinners.  Taking medicines such as aspirin and ibuprofen. These medicines can thin your blood. Do not take these medicines before your procedure if your doctor tells you not to. Tests  You may have blood tests.  You may have a test to check the electrical signals in your heart (electrocardiogram, ECG).  You may have imaging tests, such as a chest X-ray. General instructions  For 24 hours before the procedure, stop using products that contain nicotine or tobacco, such as cigarettes and e-cigarettes. If you need help quitting, ask your health care provider.  Plan to have someone take you home from the hospital or clinic.  You may be asked to shower with a germ-killing soap. What happens during the procedure?  To reduce your risk of infection: ? Your health care team will wash or sanitize their hands. ? Your skin will be washed with soap. ? Hair may be removed from the surgical area.  Small monitors will be put on your body. They will be used to check your heart, blood pressure, and oxygen level.  An IV tube will be inserted into one of your veins.  You will be given one or more of the following: ? A medicine to help you relax (sedative). ? A medicine to numb the area (local anesthetic). ? A medicine to make you fall asleep (general anesthetic).  Leads will be guided through a blood vessel into your heart and attached to your heart muscles. Depending on the ICD, the leads may go into one ventricle or they may go into both ventricles and into an upper chamber of the heart. An X-ray machine (fluoroscope) will be usedto help guide the leads.  A small incision will be made to create a deep pocket under your skin.  The pulse generator will be placed into the pocket.  The ICD will be tested.  The incision will be closed with stitches (sutures), skin glue, or staples.  A  bandage (dressing) will be placed over the incision. This procedure may vary among health care providers and hospitals. What happens after the procedure?  Your blood pressure, heart rate, breathing rate, and blood oxygen level will be monitored often until the medicines you were given have worn off.  A chest X-ray will be taken to check that the ICD is in the right place.  You will need to stay in the hospital for 1-2 days so your health care provider can make sure your ICD is working.  Do not drive for 24 hours if you received a sedative. Ask your health care provider when it is safe for you to drive.  You may be given an identification card explaining that you have an ICD. Summary  An implantable cardioverter defibrillator (ICD) is a small device that is placed under the skin in the chest or abdomen. It is used to detect and correct dangerous irregular heartbeats (arrhythmias).  An ICD consists of a battery, a small computer (pulse generator), and wires (leads) that go into the heart.  When an ICD detects rapid  heart rhythm (tachycardia), it sends a low-energy shock to the heart to restore the heartbeat to normal (cardioversion). If cardioversion does not work or if the ICD detects uncoordinated heart contractions (fibrillation), it delivers a high-energy shock to the heart (defibrillation) to restart the heart.  You will need to stay in the hospital for 1-2 days to make sure your ICD is working. This information is not intended to replace advice given to you by your health care provider. Make sure you discuss any questions you have with your health care provider. Document Released: 09/15/2001 Document Revised: 01/03/2016 Document Reviewed: 01/03/2016 Elsevier Interactive Patient Education  2019 [employer].

## 2018-06-30 NOTE — Telephone Encounter (Signed)
Left message for Pt wife.  Advised Pt would not be able to drive again until at least after his ICD placement.  Advised Pt would be advised further at that time.

## 2018-06-30 NOTE — Progress Notes (Signed)
HPI [name] returns today for followup. He is a pleasant 63 yo man with a h/o VF arrest, AI, s/p AVR who returns today for followup. He has done well after his valve replacement. 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 He has minimal incisional pain. No edema.  He has known LBBB with a long first degree AV block and an EF of 35% by echo. He has class 2 CHF. His QRS duration is 154 ms. No Known Allergies   Current Outpatient Medications  Medication Sig Dispense Refill  . amiodarone (PACERONE) 200 MG tablet Take 1 tablet (200 mg total) by mouth daily. 30 tablet 1  . aspirin EC 81 MG tablet Take 1 tablet (81 mg total) by mouth daily.    . metoprolol tartrate (LOPRESSOR) 25 MG tablet Take 0.5 tablets (12.5 mg total) by mouth 2 (two) times daily. 30 tablet 1  . oxyCODONE (OXY IR/ROXICODONE) 5 MG immediate release tablet Take 1 tablet (5 mg total) by mouth every 4 (four) hours as needed for severe pain. 30 tablet 0  . rivaroxaban (XARELTO) 20 MG TABS tablet Take 20 mg by mouth daily with supper.    . senna-docusate (SENOKOT-S) 8.6-50 MG tablet Take 1 tablet by mouth at bedtime as needed for mild constipation. 30 tablet 1  . metoCLOPramide (REGLAN) 5 MG tablet Take 1 tablet (5 mg total) by mouth 4 (four) times daily -  before meals and at bedtime. 120 tablet 1   No current facility-administered medications for this visit.      Past Medical History:  Diagnosis Date  . Aortic insufficiency   . Cardiac arrest ([place]) 03/24/2018  . Dysrhythmia   . History of kidney stones   . Pneumonia    history of  . Pulmonary embolism ([place])    04/04/18  . Thoracic aortic aneurysm ([place]) 06/24/2018   CT 03/2018:  Ao root 4.2 cm; ascending Aorta 4.3 cm    ROS:   All systems reviewed and negative except as noted in the HPI.   Past Surgical History:  Procedure Laterality Date  . AORTIC ARCH ANGIOGRAPHY N/A 04/02/2018   Procedure: AORTIC ARCH ANGIOGRAPHY;  Surgeon: Troy Sine, MD;  Location:  [facility];  Service: Cardiovascular;  Laterality: N/A;  . AORTIC VALVE REPLACEMENT N/A 06/08/2018   Procedure: AORTIC VALVE REPLACEMENT (AVR), ON PUMP, USING MAGNA EASE AORTIC BIOPROSTHESIS VALVE 25MM;  Surgeon: Grace Isaac, MD;  Location: [facility];  Service: Open Heart Surgery;  Laterality: N/A;  . RIGHT/LEFT HEART CATH AND CORONARY ANGIOGRAPHY N/A 04/02/2018   Procedure: RIGHT/LEFT HEART CATH AND CORONARY ANGIOGRAPHY;  Surgeon: Troy Sine, MD;  Location: [facility];  Service: Cardiovascular;  Laterality: N/A;  . TEE WITHOUT CARDIOVERSION N/A 06/08/2018   Procedure: TRANSESOPHAGEAL ECHOCARDIOGRAM (TEE);  Surgeon: Grace Isaac, MD;  Location: [facility];  Service: Open Heart Surgery;  Laterality: N/A;     Family History  Problem Relation Age of Onset  . Pulmonary embolism Mother   . Prostate cancer Father        passed awary at 60     Social History   Socioeconomic History  . Marital status: Married    Spouse name: Not on file  . Number of children: Not on file  . Years of education: Not on file  . Highest education level: Not on file  Occupational History  . Not on file  Social Needs  . Financial resource strain:  Not on file  . Food insecurity    Worry: Not on file    Inability: Not on file  . Transportation needs    Medical: Not on file    Non-medical: Not on file  Tobacco Use  . Smoking status: Never Smoker  . Smokeless tobacco: Never Used  Substance and Sexual Activity  . Alcohol use: Never    Frequency: Never  . Drug use: Never  . Sexual activity: Not on file  Lifestyle  . Physical activity    Days per week: Not on file    Minutes per session: Not on file  . Stress: Not on file  Relationships  . Social Musician on phone: Not on file    Gets together: Not on file    Attends religious service: Not on file    Active member of club or organization: Not on file    Attends meetings of clubs or organizations: Not on file     Relationship status: Not on file  . Intimate partner violence    Fear of current or ex partner: Not on file    Emotionally abused: Not on file    Physically abused: Not on file    Forced sexual activity: Not on file  Other Topics Concern  . Not on file  Social History Narrative  . Not on file     BP 116/82   Pulse (!) 58   Ht 5\' 9"  (1.753 m)   Wt 146 lb (66.2 kg)   SpO2 98%   BMI 21.56 kg/m   Physical Exam:  Well appearing 63 yo man, NAD HEENT: Unremarkable Neck:  No JVD, no thyromegally Lymphatics:  No adenopathy Back:  No CVA tenderness Lungs:  No increased work of breathing HEART:  RRR Abd:  Non-distended. Ext:  2 plus pulses, no edema, no cyanosis, no clubbing Skin:  No rashes no nodules Neuro:  CN II through XII intact, motor grossly intact  EKG - nsr with first degree AV block and LBBB   Assess/Plan: 1. VF arrest - I have discussed the indications/risks/benefits/goals/expectations of ICD insertion with the patient and he will call us to proceed. His indication is secondary prevention. 2. Non-ischemic CM - his EF is 35%. He has class 2 symptoms. He will call us when he wishes to proceed with ICD. With LBBB and QRS of 154 he will undergo biv ICD insertion 3. AI - he is s/p valve replacement. He has healed up well.  Leonia Reeves.D.

## 2018-07-01 ENCOUNTER — Telehealth (HOSPITAL_COMMUNITY): Payer: Self-pay | Admitting: *Deleted

## 2018-07-01 NOTE — Telephone Encounter (Signed)

## 2018-07-02 ENCOUNTER — Telehealth: Payer: Self-pay | Admitting: Internal Medicine

## 2018-07-02 ENCOUNTER — Ambulatory Visit (HOSPITAL_COMMUNITY): Payer: BC Managed Care – PPO | Attending: Cardiology

## 2018-07-02 ENCOUNTER — Other Ambulatory Visit: Payer: Self-pay

## 2018-07-02 DIAGNOSIS — I351 Nonrheumatic aortic (valve) insufficiency: Secondary | ICD-10-CM

## 2018-07-02 DIAGNOSIS — I428 Other cardiomyopathies: Secondary | ICD-10-CM

## 2018-07-02 DIAGNOSIS — Z952 Presence of prosthetic heart valve: Secondary | ICD-10-CM | POA: Diagnosis not present

## 2018-07-02 NOTE — Telephone Encounter (Signed)
New message   Patient's wife states that patient would like Defibrillator put in on July 17, 2018. Please call to discuss.

## 2018-07-03 ENCOUNTER — Encounter: Payer: Self-pay | Admitting: Physician Assistant

## 2018-07-07 MED ORDER — AMIODARONE HCL 200 MG PO TABS: 200.0000 mg | ORAL_TABLET | Freq: Every day | ORAL | 0 refills | Status: DC

## 2018-07-07 NOTE — Telephone Encounter (Signed)
Spoke with pt and his wife regarding ICD implant. They have verbalized understanding of all pre procedural instructions. Please refer to MyChart message for details.

## 2018-07-07 NOTE — Telephone Encounter (Signed)
Pt requested Amiodarone refill. One 90 day script was sent.

## 2018-07-07 NOTE — Addendum Note (Signed)
Addended by: Dollene Primrose on: 07/07/2018 10:36 AM   Modules accepted: Orders

## 2018-07-08 ENCOUNTER — Other Ambulatory Visit: Payer: Self-pay | Admitting: Cardiothoracic Surgery

## 2018-07-08 ENCOUNTER — Other Ambulatory Visit: Payer: Self-pay

## 2018-07-08 DIAGNOSIS — Z952 Presence of prosthetic heart valve: Secondary | ICD-10-CM

## 2018-07-08 NOTE — Progress Notes (Signed)
[address]       ,[place] [address]             [phone]      Aspen L Reist [facility] Medical Record #[id number] Date of Birth: 06-11-1955  Referring: Carol Ada, MD Primary Care: Carol Ada, MD Primary Cardiologist: Sherren Mocha, MD   Chief Complaint:   POST OP FOLLOW UP OPERATIVE REPORT DATE OF PROCEDURE:  06/08/2018 PREOPERATIVE DIAGNOSES:  Severe aortic insufficiency with history of ventricular fibrillation arrest at home. POSTOPERATIVE DIAGNOSES:  Severe aortic insufficiency with history of ventricular fibrillation arrest at home. SURGICAL PROCEDURE:  Aortic valve replacement with a pericardial tissue valve, [employer], model 3300 TFX, 25 mm, serial [id number]  History of Present Illness:     Patient returns after recent aortic valve replacement for severe aortic insufficiency.  He originally presented with out-of-hospital cardiac arrest.  Since discharge she is continues to improve.  No signs or symptoms of congestive heart failure.  He has been wearing a life since discharge.  He is discussing with Dr. Lovena Le placement of a AICD device in the near future.     Past Medical History:  Diagnosis Date  . Aortic insufficiency   . Cardiac arrest ([place]) 03/24/2018  . Dysrhythmia   . History of kidney stones   . Pneumonia    history of  . Pulmonary embolism ([place])    04/04/18  . S/P AVR (aortic valve replacement)    Echocardiogram 07/03/2018: EF 45-50, septal-lateral dyssynchrony, mild LVH, grade 2 diastolic dysfunction, normal RVSF, mild MR, bioprosthetic AoV with no regurgitation and mildly elevated mean gradient at 18, mild dilation of the ascending aorta (42), PASP 21  . Thoracic aortic aneurysm ([place]) 06/24/2018   CT 03/2018:  Ao root 4.2 cm; ascending Aorta 4.3 cm     Social History   Tobacco Use  Smoking Status Never Smoker  Smokeless Tobacco Never Used    Social History   Substance and Sexual Activity  Alcohol Use  Never  . Frequency: Never     No Known Allergies  Current Outpatient Medications  Medication Sig Dispense Refill  . amiodarone (PACERONE) 200 MG tablet Take 1 tablet (200 mg total) by mouth daily. 90 tablet 0  . aspirin EC 81 MG tablet Take 1 tablet (81 mg total) by mouth daily.    . metoprolol tartrate (LOPRESSOR) 25 MG tablet Take 0.5 tablets (12.5 mg total) by mouth 2 (two) times daily. 30 tablet 1  . oxyCODONE (OXY IR/ROXICODONE) 5 MG immediate release tablet Take 1 tablet (5 mg total) by mouth every 4 (four) hours as needed for severe pain. 30 tablet 0  . rivaroxaban (XARELTO) 20 MG TABS tablet Take 20 mg by mouth daily with supper.    . senna-docusate (SENOKOT-S) 8.6-50 MG tablet Take 1 tablet by mouth at bedtime as needed for mild constipation. 30 tablet 1  . metoCLOPramide (REGLAN) 5 MG tablet Take 1 tablet (5 mg total) by mouth 4 (four) times daily -  before meals and at bedtime. 120 tablet 1   No current facility-administered medications for this visit.        Physical Exam: BP (!) 148/88   Pulse (!) 50   Temp 97.8 F (36.6 C) (Skin)   Resp 20   Ht 5\' 9"  (1.753 m)   Wt 149 lb (67.6 kg)   SpO2 96% Comment: RA  BMI 22.00 kg/m   General appearance: alert and cooperative Neurologic: intact Heart:  regular rate and rhythm, S1, S2 normal, no murmur, click, rub or gallop Lungs: clear to auscultation bilaterally Abdomen: soft, non-tender; bowel sounds normal; no masses,  no organomegaly Extremities: extremities normal, atraumatic, no cyanosis or edema and Homans sign is negative, no sign of DVT Wound: Sternum is stable and well-healed   Diagnostic Studies & Laboratory data:     Recent Radiology Findings:   Dg Chest 2 View  Result Date: 07/09/2018 CLINICAL DATA:  Status post aortic valve replacement EXAM: CHEST - 2 VIEW COMPARISON:  06/12/2018 FINDINGS: Cardiac shadow is stable. Aortic valve replacement is again noted. The lungs are clear. No infiltrate or effusion is  seen. No bony abnormality is noted. IMPRESSION: No active cardiopulmonary disease. Electronically Signed   By: Alcide Clever M.D.   On: 07/09/2018 13:28    I have independently reviewed the above radiology studies  and reviewed the findings with the patient.    Recent Lab Findings: Lab Results  Component Value Date   WBC 5.8 06/30/2018   HGB 11.4 (L) 06/30/2018   HCT 36.4 (L) 06/30/2018   PLT 471 (H) 06/30/2018   GLUCOSE 93 06/30/2018   ALT 16 06/03/2018   AST 15 06/03/2018   NA 138 06/30/2018   K 4.9 06/30/2018   CL 102 06/30/2018   CREATININE 1.38 (H) 06/30/2018   BUN 12 06/30/2018   CO2 24 06/30/2018   TSH 0.920 06/12/2018   INR 1.9 (H) 06/08/2018   HGBA1C 5.0 06/03/2018      Assessment / Plan:   #1 stable after aortic valve replacement with pericardial tissue valve #2 original presentation with out-of-hospital V. fib arrest-ncurrent currently wearing a LifeVest and being evaluated for placement of AICD device  Patient will discuss with Dr. 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     Delight Ovens MD      [address] [place] [place] [address] Office [phone]   Beeper [phone]  07/09/2018 2:12 PM

## 2018-07-08 NOTE — Progress Notes (Signed)
cxr 

## 2018-07-09 ENCOUNTER — Ambulatory Visit
Admission: RE | Admit: 2018-07-09 | Discharge: 2018-07-09 | Disposition: A | Discharge status: Home / Self Care | Payer: BC Managed Care – PPO | Source: Ambulatory Visit | Attending: Cardiothoracic Surgery | Admitting: Cardiothoracic Surgery

## 2018-07-09 ENCOUNTER — Ambulatory Visit (INDEPENDENT_AMBULATORY_CARE_PROVIDER_SITE_OTHER): Payer: Self-pay | Admitting: Cardiothoracic Surgery

## 2018-07-09 VITALS — BP 148/88 | HR 50 | Temp 97.8°F | Resp 20 | Ht 69.0 in | Wt 149.0 lb

## 2018-07-09 DIAGNOSIS — Z954 Presence of other heart-valve replacement: Secondary | ICD-10-CM | POA: Diagnosis not present

## 2018-07-09 DIAGNOSIS — Q231 Congenital insufficiency of aortic valve: Secondary | ICD-10-CM

## 2018-07-09 DIAGNOSIS — Z952 Presence of prosthetic heart valve: Secondary | ICD-10-CM

## 2018-07-09 DIAGNOSIS — I351 Nonrheumatic aortic (valve) insufficiency: Secondary | ICD-10-CM

## 2018-07-09 NOTE — Patient Instructions (Signed)

## 2018-07-14 ENCOUNTER — Other Ambulatory Visit (HOSPITAL_COMMUNITY)
Admission: RE | Admit: 2018-07-14 | Discharge: 2018-07-14 | Disposition: A | Discharge status: Home / Self Care | Payer: BC Managed Care – PPO | Source: Ambulatory Visit | Attending: Internal Medicine | Admitting: Internal Medicine

## 2018-07-14 DIAGNOSIS — Z01812 Encounter for preprocedural laboratory examination: Secondary | ICD-10-CM | POA: Diagnosis not present

## 2018-07-14 DIAGNOSIS — Z1159 Encounter for screening for other viral diseases: Secondary | ICD-10-CM | POA: Diagnosis not present

## 2018-07-14 LAB — SARS CORONAVIRUS 2 (TAT 6-24 HRS): SARS Coronavirus 2: NEGATIVE

## 2018-07-17 ENCOUNTER — Encounter (HOSPITAL_COMMUNITY): Payer: Self-pay

## 2018-07-17 ENCOUNTER — Ambulatory Visit (HOSPITAL_COMMUNITY)
Admission: RE | Admit: 2018-07-17 | Discharge: 2018-07-18 | Disposition: A | Discharge status: Home / Self Care | Payer: BC Managed Care – PPO | Attending: Internal Medicine | Admitting: Internal Medicine

## 2018-07-17 ENCOUNTER — Encounter (HOSPITAL_COMMUNITY)
Admission: RE | Disposition: A | Discharge status: Home / Self Care | Payer: Self-pay | Source: Home / Self Care | Attending: Internal Medicine

## 2018-07-17 ENCOUNTER — Other Ambulatory Visit: Payer: Self-pay

## 2018-07-17 DIAGNOSIS — Z7901 Long term (current) use of anticoagulants: Secondary | ICD-10-CM | POA: Diagnosis not present

## 2018-07-17 DIAGNOSIS — I712 Thoracic aortic aneurysm, without rupture: Secondary | ICD-10-CM | POA: Diagnosis not present

## 2018-07-17 DIAGNOSIS — I4901 Ventricular fibrillation: Secondary | ICD-10-CM | POA: Diagnosis not present

## 2018-07-17 DIAGNOSIS — I447 Left bundle-branch block, unspecified: Secondary | ICD-10-CM | POA: Diagnosis not present

## 2018-07-17 DIAGNOSIS — I509 Heart failure, unspecified: Secondary | ICD-10-CM | POA: Diagnosis not present

## 2018-07-17 DIAGNOSIS — Z79899 Other long term (current) drug therapy: Secondary | ICD-10-CM | POA: Diagnosis not present

## 2018-07-17 DIAGNOSIS — Z8674 Personal history of sudden cardiac arrest: Secondary | ICD-10-CM | POA: Diagnosis not present

## 2018-07-17 DIAGNOSIS — I440 Atrioventricular block, first degree: Secondary | ICD-10-CM | POA: Diagnosis not present

## 2018-07-17 DIAGNOSIS — Z006 Encounter for examination for normal comparison and control in clinical research program: Secondary | ICD-10-CM | POA: Diagnosis not present

## 2018-07-17 DIAGNOSIS — Z7982 Long term (current) use of aspirin: Secondary | ICD-10-CM | POA: Insufficient documentation

## 2018-07-17 DIAGNOSIS — Z86711 Personal history of pulmonary embolism: Secondary | ICD-10-CM | POA: Insufficient documentation

## 2018-07-17 DIAGNOSIS — Z952 Presence of prosthetic heart valve: Secondary | ICD-10-CM | POA: Diagnosis not present

## 2018-07-17 DIAGNOSIS — I428 Other cardiomyopathies: Secondary | ICD-10-CM | POA: Diagnosis not present

## 2018-07-17 DIAGNOSIS — Z95810 Presence of automatic (implantable) cardiac defibrillator: Secondary | ICD-10-CM

## 2018-07-17 DIAGNOSIS — I5022 Chronic systolic (congestive) heart failure: Secondary | ICD-10-CM | POA: Diagnosis not present

## 2018-07-17 HISTORY — PX: BIV ICD INSERTION CRT-D: EP1195

## 2018-07-17 LAB — SURGICAL PCR SCREEN
MRSA, PCR: NEGATIVE
Staphylococcus aureus: NEGATIVE

## 2018-07-17 SURGERY — BIV ICD INSERTION CRT-D

## 2018-07-17 MED ORDER — HEPARIN (PORCINE) IN NACL 1000-0.9 UT/500ML-% IV SOLN
INTRAVENOUS | Status: AC
  Filled 2018-07-17: qty 500

## 2018-07-17 MED ORDER — SENNOSIDES-DOCUSATE SODIUM 8.6-50 MG PO TABS: 1.0000 | ORAL_TABLET | Freq: Every evening | ORAL | Status: DC | PRN

## 2018-07-17 MED ORDER — CEFAZOLIN SODIUM-DEXTROSE 1-4 GM/50ML-% IV SOLN
1.0000 g | Freq: Four times a day (QID) | INTRAVENOUS | Status: AC
  Administered 2018-07-17 – 2018-07-18 (×3): 1 g via INTRAVENOUS
  Filled 2018-07-17 (×3): qty 50

## 2018-07-17 MED ORDER — SODIUM CHLORIDE 0.9 % IV SOLN
80.0000 mg | INTRAVENOUS | Status: AC
  Administered 2018-07-17: 80 mg
  Filled 2018-07-17: qty 2

## 2018-07-17 MED ORDER — CHLORHEXIDINE GLUCONATE 4 % EX LIQD
60.0000 mL | Freq: Once | CUTANEOUS | Status: DC
  Filled 2018-07-17: qty 60

## 2018-07-17 MED ORDER — HEPARIN (PORCINE) IN NACL 1000-0.9 UT/500ML-% IV SOLN
INTRAVENOUS | Status: DC | PRN
  Administered 2018-07-17: 500 mL

## 2018-07-17 MED ORDER — CEFAZOLIN SODIUM-DEXTROSE 2-4 GM/100ML-% IV SOLN
INTRAVENOUS | Status: AC
  Filled 2018-07-17: qty 100

## 2018-07-17 MED ORDER — CEFAZOLIN SODIUM-DEXTROSE 2-4 GM/100ML-% IV SOLN
2.0000 g | INTRAVENOUS | Status: AC
  Administered 2018-07-17: 2 g via INTRAVENOUS

## 2018-07-17 MED ORDER — MUPIROCIN 2 % EX OINT
TOPICAL_OINTMENT | CUTANEOUS | Status: AC
  Administered 2018-07-17: 1 via TOPICAL
  Filled 2018-07-17: qty 22

## 2018-07-17 MED ORDER — ASPIRIN EC 81 MG PO TBEC
81.0000 mg | DELAYED_RELEASE_TABLET | Freq: Every day | ORAL | Status: DC
  Administered 2018-07-18: 81 mg via ORAL
  Filled 2018-07-17 (×2): qty 1

## 2018-07-17 MED ORDER — MUPIROCIN 2 % EX OINT
1.0000 "application " | TOPICAL_OINTMENT | Freq: Once | CUTANEOUS | Status: AC
  Administered 2018-07-17: 1 via TOPICAL

## 2018-07-17 MED ORDER — BUPIVACAINE HCL (PF) 0.25 % IJ SOLN
INTRAMUSCULAR | Status: DC | PRN
  Administered 2018-07-17: 40 mL

## 2018-07-17 MED ORDER — SODIUM CHLORIDE 0.9 % IV SOLN
INTRAVENOUS | Status: DC
  Administered 2018-07-17: 11:00:00 via INTRAVENOUS

## 2018-07-17 MED ORDER — OXYCODONE HCL 5 MG PO TABS
5.0000 mg | ORAL_TABLET | ORAL | Status: DC | PRN
  Administered 2018-07-17 – 2018-07-18 (×3): 5 mg via ORAL
  Filled 2018-07-17 (×3): qty 1

## 2018-07-17 MED ORDER — OFF THE BEAT BOOK
Freq: Once | Status: DC
  Filled 2018-07-17: qty 1

## 2018-07-17 MED ORDER — AMIODARONE HCL 200 MG PO TABS
200.0000 mg | ORAL_TABLET | Freq: Every day | ORAL | Status: DC
  Administered 2018-07-17 – 2018-07-18 (×2): 200 mg via ORAL
  Filled 2018-07-17 (×2): qty 1

## 2018-07-17 MED ORDER — IOHEXOL 350 MG/ML SOLN
INTRAVENOUS | Status: DC | PRN
  Administered 2018-07-17: 10 mL via INTRAVENOUS

## 2018-07-17 MED ORDER — METOPROLOL TARTRATE 25 MG PO TABS
25.0000 mg | ORAL_TABLET | Freq: Once | ORAL | Status: AC
  Administered 2018-07-17: 25 mg via ORAL

## 2018-07-17 MED ORDER — METOPROLOL TARTRATE 25 MG/10 ML ORAL SUSPENSION
25.0000 mg | Freq: Once | ORAL | Status: DC
  Filled 2018-07-17: qty 10

## 2018-07-17 MED ORDER — ONDANSETRON HCL 4 MG/2ML IJ SOLN: 4.0000 mg | Freq: Four times a day (QID) | INTRAMUSCULAR | Status: DC | PRN

## 2018-07-17 MED ORDER — METOCLOPRAMIDE HCL 5 MG PO TABS: 5.0000 mg | ORAL_TABLET | Freq: Three times a day (TID) | ORAL | Status: DC

## 2018-07-17 MED ORDER — MIDAZOLAM HCL 5 MG/5ML IJ SOLN
INTRAMUSCULAR | Status: AC
  Filled 2018-07-17: qty 5

## 2018-07-17 MED ORDER — FENTANYL CITRATE (PF) 100 MCG/2ML IJ SOLN
INTRAMUSCULAR | Status: AC
  Filled 2018-07-17: qty 2

## 2018-07-17 MED ORDER — METOPROLOL TARTRATE 12.5 MG HALF TABLET
12.5000 mg | ORAL_TABLET | Freq: Two times a day (BID) | ORAL | Status: DC
  Administered 2018-07-17 – 2018-07-18 (×2): 12.5 mg via ORAL
  Filled 2018-07-17 (×3): qty 1

## 2018-07-17 MED ORDER — LIDOCAINE HCL 1 % IJ SOLN
INTRAMUSCULAR | Status: AC
  Filled 2018-07-17: qty 60

## 2018-07-17 MED ORDER — ACETAMINOPHEN 325 MG PO TABS: 325.0000 mg | ORAL_TABLET | ORAL | Status: DC | PRN

## 2018-07-17 MED ORDER — SODIUM CHLORIDE 0.9 % IV SOLN
INTRAVENOUS | Status: AC
  Filled 2018-07-17: qty 2

## 2018-07-17 MED ORDER — MIDAZOLAM HCL 5 MG/5ML IJ SOLN
INTRAMUSCULAR | Status: DC | PRN
  Administered 2018-07-17 (×4): 1 mg via INTRAVENOUS

## 2018-07-17 MED ORDER — FENTANYL CITRATE (PF) 100 MCG/2ML IJ SOLN
INTRAMUSCULAR | Status: DC | PRN
  Administered 2018-07-17 (×4): 12.5 ug via INTRAVENOUS

## 2018-07-17 SURGICAL SUPPLY — 14 items
CABLE SURGICAL S-101-97-12 (CABLE) ×3 IMPLANT
CATH ACUITYPRO 45CM EH 9F (CATHETERS) ×3 IMPLANT
CATH HEX JOS 2-5-2 65CM 6F REP (CATHETERS) ×3 IMPLANT
ICD VIGILANT DF4 G247 (ICD Generator) ×3 IMPLANT
KIT ESSENTIALS PG (KITS) ×3 IMPLANT
LEAD ACUITY X4 4677 (Lead) ×3 IMPLANT
LEAD INGEVITY 7841 52 (Lead) ×3 IMPLANT
LEAD RELIANCE G DF4 0293 (Lead) ×3 IMPLANT
PAD PRO RADIOLUCENT 2001M-C (PAD) ×3 IMPLANT
SHEATH 7FR PRELUDE SNAP 13 (SHEATH) ×9 IMPLANT
SHEATH CLASSIC 9.5F (SHEATH) ×3 IMPLANT
SHEATH CLASSIC 9F (SHEATH) ×3 IMPLANT
TRAY PACEMAKER INSERTION (PACKS) ×3 IMPLANT
WIRE ACUITY WHISPER EDS 4648 (WIRE) ×3 IMPLANT

## 2018-07-17 NOTE — Plan of Care (Signed)
  Problem: Education: Goal: Knowledge of cardiac device and self-care will improve Outcome: Progressing Goal: Ability to safely manage health related needs after discharge will improve Outcome: Progressing Goal: Individualized Educational Video(s) Outcome: Progressing   Problem: Cardiac: Goal: Ability to achieve and maintain adequate cardiopulmonary perfusion will improve Outcome: Progressing   Problem: Education: Goal: Ability to demonstrate management of disease process will improve Outcome: Progressing Goal: Ability to verbalize understanding of medication therapies will improve Outcome: Progressing Goal: Individualized Educational Video(s) Outcome: Progressing   Problem: Activity: Goal: Capacity to carry out activities will improve Outcome: Progressing   Problem: Cardiac: Goal: Ability to achieve and maintain adequate cardiopulmonary perfusion will improve Outcome: Progressing

## 2018-07-17 NOTE — Interval H&P Note (Signed)
History and Physical Interval Note:  07/17/2018 11:39 AM  Kevin Rojas  has presented today for surgery, with the diagnosis of Cardiomyopathy.  The various methods of treatment have been discussed with the patient and family. After consideration of risks, benefits and other options for treatment, the patient has consented to  Procedure(s): BIV ICD INSERTION CRT-D (N/A) as a surgical intervention.  The patient's history has been reviewed, patient examined, no change in status, stable for surgery.  I have reviewed the patient's chart and labs.  Questions were answered to the patient's satisfaction.     Cristopher Peru

## 2018-07-17 NOTE — Progress Notes (Signed)
Fresh post op dressing s/p ICD implantation so dressing not removed it is clean dry and intact to left chest/shoulder area.

## 2018-07-17 NOTE — Discharge Instructions (Signed)
° ° °  Supplemental Discharge Instructions for  Pacemaker/Defibrillator Patients  Activity No heavy lifting or vigorous activity with your left/right arm for 6 to 8 weeks.  Do not raise your left/right arm above your head for one week.  Gradually raise your affected arm as drawn below.              07/21/2018                07/22/2018                 07/23/2018              07/24/2018 __  NO DRIVING for 6 months after your cardiac arrest on 03/24/2018   .  WOUND CARE - Keep the wound area clean and dry.  Do not get this area wet for one week. No showers for one week; you may shower on 07/24/2018   . - The tape/steri-strips on your wound will fall off; do not pull them off.  No bandage is needed on the site.  DO  NOT apply any creams, oils, or ointments to the wound area. - If you notice any drainage or discharge from the wound, any swelling or bruising at the site, or you develop a fever > 101? F after you are discharged home, call the office at once.  Special Instructions - You are still able to use cellular telephones; use the ear opposite the side where you have your pacemaker/defibrillator.  Avoid carrying your cellular phone near your device. - When traveling through airports, show security personnel your identification card to avoid being screened in the metal detectors.  Ask the security personnel to use the hand wand. - Avoid arc welding equipment, MRI testing (magnetic resonance imaging), TENS units (transcutaneous nerve stimulators).  Call the office for questions about other devices. - Avoid electrical appliances that are in poor condition or are not properly grounded. - Microwave ovens are safe to be near or to operate.  Additional information for defibrillator patients should your device go off: - If your device goes off ONCE and you feel fine afterward, notify the device clinic nurses. - If your device goes off ONCE and you do not feel well afterward, call 911. - If your device goes  off TWICE, call 911. - If your device goes off THREE times in one day, call 911.  DO NOT DRIVE YOURSELF OR A FAMILY MEMBER WITH A DEFIBRILLATOR TO THE HOSPITAL--CALL 911.

## 2018-07-17 NOTE — Plan of Care (Signed)
progresing

## 2018-07-17 NOTE — Progress Notes (Signed)
Patient blood pressure has been elevated since procedure done today.Blood pressure 144/112 after night time dose of lopressor given.[place] notified.No new orders received will continue to monitor patient.

## 2018-07-18 ENCOUNTER — Other Ambulatory Visit: Payer: Self-pay

## 2018-07-18 ENCOUNTER — Ambulatory Visit (HOSPITAL_COMMUNITY): Payer: BC Managed Care – PPO

## 2018-07-18 DIAGNOSIS — Z006 Encounter for examination for normal comparison and control in clinical research program: Secondary | ICD-10-CM | POA: Diagnosis not present

## 2018-07-18 DIAGNOSIS — I440 Atrioventricular block, first degree: Secondary | ICD-10-CM | POA: Diagnosis not present

## 2018-07-18 DIAGNOSIS — Z95810 Presence of automatic (implantable) cardiac defibrillator: Secondary | ICD-10-CM

## 2018-07-18 DIAGNOSIS — Z952 Presence of prosthetic heart valve: Secondary | ICD-10-CM | POA: Diagnosis not present

## 2018-07-18 DIAGNOSIS — Z7901 Long term (current) use of anticoagulants: Secondary | ICD-10-CM | POA: Diagnosis not present

## 2018-07-18 DIAGNOSIS — Z79899 Other long term (current) drug therapy: Secondary | ICD-10-CM | POA: Diagnosis not present

## 2018-07-18 DIAGNOSIS — I509 Heart failure, unspecified: Secondary | ICD-10-CM | POA: Diagnosis not present

## 2018-07-18 DIAGNOSIS — I4901 Ventricular fibrillation: Secondary | ICD-10-CM

## 2018-07-18 DIAGNOSIS — I428 Other cardiomyopathies: Secondary | ICD-10-CM | POA: Diagnosis not present

## 2018-07-18 DIAGNOSIS — Z86711 Personal history of pulmonary embolism: Secondary | ICD-10-CM | POA: Diagnosis not present

## 2018-07-18 DIAGNOSIS — Z8674 Personal history of sudden cardiac arrest: Secondary | ICD-10-CM | POA: Diagnosis not present

## 2018-07-18 DIAGNOSIS — Z7982 Long term (current) use of aspirin: Secondary | ICD-10-CM | POA: Diagnosis not present

## 2018-07-18 DIAGNOSIS — I447 Left bundle-branch block, unspecified: Secondary | ICD-10-CM | POA: Diagnosis not present

## 2018-07-18 DIAGNOSIS — I712 Thoracic aortic aneurysm, without rupture: Secondary | ICD-10-CM | POA: Diagnosis not present

## 2018-07-18 HISTORY — DX: Presence of automatic (implantable) cardiac defibrillator: Z95.810

## 2018-07-18 MED ORDER — ACETAMINOPHEN 325 MG PO TABS: 325.0000 mg | ORAL_TABLET | ORAL | Status: AC | PRN

## 2018-07-18 MED ORDER — METOPROLOL TARTRATE 25 MG PO TABS: 12.5000 mg | ORAL_TABLET | Freq: Two times a day (BID) | ORAL | 3 refills | Status: DC

## 2018-07-18 NOTE — Discharge Summary (Addendum)
Discharge Summary    Patient ID: Kevin Rojas MRN: [id number]; DOB: November 29, 1955  Admit date: 07/17/2018 Discharge date: 07/18/2018  Primary Care Provider: Carol Ada, MD  Primary Cardiologist: Sherren Mocha, MD  Primary Electrophysiologist:  Cristopher Peru, MD   Discharge Diagnoses    Active Problems:   Ventricular fibrillation [facility])   History of implantable cardioverter-defibrillator (ICD) placement  Allergies No Known Allergies  Diagnostic Studies/Procedures    Biventricular ICD insertion CRT/D performed 07/17/2018  History of Present Illness     Kevin Rojas was seen in follow-up by Dr. Lovena Le on 06/30/2018.  He has a h/o VF arrest and AI s/p AVR, known LBBB with a first-degree AV block and an LVEF of 35% per echocardiogram with class II CHF. He was noted to have done well after his valve replacement. 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 His QRS duration is 154 ms.  During this visit, plan was made for ICD insertion for secondary prevention given history of VF arrest and reduced LV function.   Hospital Course     Patient presented [facility] on 07/17/2018 for biventricular ICD insertion CRT/D.  On day of discharge, patient was seen by Dr. Curt Bears in which the device was functioning properly [facility] CRT/D).  CXR completed and device interrogated without issue.   Other problems include:  -VF arrest in which ICD insertion was recommended for secondary prevention  -Nonischemic cardiomyopathy: -Per echocardiogram, LVEF noted to be 35% with class II symptoms -Has known LBBB and a QRS of 154 MS in which plan was made to undergo biventricular ICD insertion  -History of AI status post valve replacement: -No issue  Consultants: None  The patient was seen and examined by Dr. Curt Bears who feels that he is stable and ready for discharge today, 07/18/2018.  Wound check appointment has been made.   Discharge Vitals Blood pressure (!) 132/109, pulse 60, temperature 98 F  (36.7 C), temperature source Oral, resp. rate 20, height 5\' 9"  (1.753 m), weight 65.9 kg, SpO2 100 %.  Filed Weights   07/17/18 1007 07/18/18 0508  Weight: 66.2 kg 65.9 kg   Labs & Radiologic Studies    Disposition   Pt is being discharged home today in good condition.  Follow-up Plans & Appointments   Follow-up Information    [facility].   Specialty: Cardiology Why: 07/28/2018 @ 11:00AM, wound check visit Contact information: [address] [place]       Evans Lance, MD Follow up.   Specialty: Cardiology Why: 10/27/2018 @ 10:30AM Contact information: [address] [place] [address] [phone]          Discharge Instructions    Call MD for:  difficulty breathing, headache or visual disturbances   Complete by: As directed    Call MD for:  extreme fatigue   Complete by: As directed    Call MD for:  hives   Complete by: As directed    Call MD for:  persistant dizziness or light-headedness   Complete by: As directed    Call MD for:  persistant nausea and vomiting   Complete by: As directed    Call MD for:  redness, tenderness, or signs of infection (pain, swelling, redness, odor or green/yellow discharge around incision site)   Complete by: As directed    Call MD for:  severe uncontrolled pain   Complete by: As directed  Call MD for:  temperature >100.4   Complete by: As directed    Diet - low sodium heart healthy   Complete by: As directed    Discharge instructions   Complete by: As directed    Supplemental Discharge Instructions for  Pacemaker/Defibrillator Patients  Activity Do not raise your left/right arm above shoulder level or extend it backward beyond shoulder level for 2 weeks. Wear the arm sling as a reminder or as needed for comfort for 2 weeks. No heavy lifting or vigorous activity with your left/right arm for 6-8 weeks.    NO  DRIVING is preferable for 2 weeks; If absolutely necessary, drive only short, familiar routes. DO wear your seatbelt, even if it crosses over the pacemaker site.  WOUND CARE Keep the wound area clean and dry.  Remove the dressing the day after you return home (usually 48 hours after the procedure). DO NOT SUBMERGE UNDER WATER UNTIL FULLY HEALED (no tub baths, hot tubs, swimming pools, etc.).  You  may shower or take a sponge bath after the dressing is removed. DO NOT SOAK the area and do not allow the shower to directly spray on the site. If you have staples, these Mickel Schreur be removed in the office in 7-14 days. If you have tape/steri-strips on your wound, these Aceyn Kathol fall off; do not pull them off prematurely.   No bandage is needed on the site.  DO  NOT apply any creams, oils, or ointments to the wound area. If you notice any drainage or discharge from the wound, any swelling, excessive redness or bruising at the site, or if you develop a fever > 101? F after you are discharged home, call the office at once.  Special Instructions You are still able to use cellular telephones.  Avoid carrying your cellular phone near your device. When traveling through airports, show security personnel your identification card to avoid being screened in the metal detectors.  Avoid arc welding equipment, MRI testing (magnetic resonance imaging), TENS units (transcutaneous nerve stimulators).  Call the office for questions about other devices. Avoid electrical appliances that are in poor condition or are not properly grounded. Microwave ovens are safe to be near or to operate.  Additional information for defibrillator patients should your device go off: If your device goes off ONCE and you feel fine afterward, notify the clinic at [phone]. If your device goes off ONCE and you do not feel well afterward, call 911. If your device goes off TWICE or more in one day, call 911.  DO NOT DRIVE YOURSELF OR A FAMILY  MEMBER WITH A DEFIBRILLATOR TO THE HOSPITAL--CALL 911.   Increase activity slowly   Complete by: As directed      Discharge Medications   Allergies as of 07/18/2018   No Known Allergies     Medication List    TAKE these medications   acetaminophen 325 MG tablet Commonly known as: TYLENOL Take 1-2 tablets (325-650 mg total) by mouth every 4 (four) hours as needed for mild pain.   amiodarone 200 MG tablet Commonly known as: PACERONE Take 1 tablet (200 mg total) by mouth daily.   aspirin EC 81 MG tablet Take 1 tablet (81 mg total) by mouth daily.   metoCLOPramide 5 MG tablet Commonly known as: REGLAN Take 1 tablet (5 mg total) by mouth 4 (four) times daily -  before meals and at bedtime.   metoprolol tartrate 25 MG tablet Commonly known as: LOPRESSOR Take 0.5 tablets (12.5 mg total)  by mouth 2 (two) times daily.   oxyCODONE 5 MG immediate release tablet Commonly known as: Oxy IR/ROXICODONE Take 1 tablet (5 mg total) by mouth every 4 (four) hours as needed for severe pain.   senna-docusate 8.6-50 MG tablet Commonly known as: Senokot-S Take 1 tablet by mouth at bedtime as needed for mild constipation.   Xarelto 20 MG Tabs tablet Generic drug: rivaroxaban Take 20 mg by mouth daily with supper.        Acute coronary syndrome (MI, NSTEMI, STEMI, etc) this admission?: No.    Outstanding Labs/Studies   None   Duration of Discharge Encounter   Greater than 30 minutes including physician time.  Signed, Georgie Chard, NP 07/18/2018, 10:22 AM  I have seen and examined this patient with Georgie Chard.  Agree with above, note added to reflect my findings.  On exam, RRR, no murmurs, lungs clear.  She is now status post CRT-D for CM and VF arrest.  Device functioning appropriately.  Chest x-ray and interrogation without issue.  Plan for discharge today with follow-up in device clinic.  Lissete Maestas M. Rushton Early MD 07/18/2018 10:24 AM

## 2018-07-18 NOTE — Progress Notes (Signed)
Patient is discharged, PIV removed, AVS printed with education complete, CCMD called with tele,etry box return.  Pt's wife and daughter to pick up pt at [employer].

## 2018-07-18 NOTE — Progress Notes (Signed)
Progress Note  Patient Name: Kevin Rojas Date of Encounter: 07/18/2018  Primary Cardiologist: Sherren Mocha, MD   Subjective   Currently feeling well.  Device functioning appropriately.  Status post [employer] CRT-D.  Inpatient Medications    Scheduled Meds: . amiodarone  200 mg Oral Daily  . aspirin EC  81 mg Oral Daily  . metoprolol tartrate  12.5 mg Oral BID  . off the beat book   Does not apply Once   Continuous Infusions:  PRN Meds: acetaminophen, ondansetron (ZOFRAN) IV, oxyCODONE, senna-docusate   Vital Signs    Vitals:   07/17/18 2359 07/18/18 0122 07/18/18 0508 07/18/18 0817  BP: (!) 132/107 (!) 134/110 (!) 137/108 (!) 132/109  Pulse:  (!) 59 60 60  Resp: 20 18 18 20   Temp: 98.4 F (36.9 C) 98.2 F (36.8 C) 98.5 F (36.9 C) 98 F (36.7 C)  TempSrc: Oral Oral Oral Oral  SpO2: 100% 100% 98% 100%  Weight:   65.9 kg   Height:        Intake/Output Summary (Last 24 hours) at 07/18/2018 0934 Last data filed at 07/18/2018 0818 Gross per 24 hour  Intake 480 ml  Output 600 ml  Net -120 ml   Last 3 Weights 07/18/2018 07/17/2018 07/09/2018  Weight (lbs) 145 lb 3.2 oz 146 lb 149 lb  Weight (kg) 65.862 kg 66.225 kg 67.586 kg      Telemetry    AV paced- Personally Reviewed  ECG    AV paced- Personally Reviewed  Physical Exam   GEN: Well nourished, well developed, in no acute distress  HEENT: normal  Neck: no JVD, carotid bruits, or masses Cardiac: RRR; no murmurs, rubs, or gallops,no edema  Respiratory:  clear to auscultation bilaterally, normal work of breathing GI: soft, nontender, nondistended, + BS MS: no deformity or atrophy  Skin: warm and dry, device site well healed Neuro:  Strength and sensation are intact Psych: euthymic mood, full affect   Labs    High Sensitivity Troponin:  No results for input(s): TROPONINIHS in the last 720 hours.    Cardiac EnzymesNo results for input(s): TROPONINI in the last 168 hours. No results for  input(s): TROPIPOC in the last 168 hours.   ChemistryNo results for input(s): NA, K, CL, CO2, GLUCOSE, BUN, CREATININE, CALCIUM, PROT, ALBUMIN, AST, ALT, ALKPHOS, BILITOT, GFRNONAA, GFRAA, ANIONGAP in the last 168 hours.   HematologyNo results for input(s): WBC, RBC, HGB, HCT, MCV, MCH, MCHC, RDW, PLT in the last 168 hours.  BNPNo results for input(s): BNP, PROBNP in the last 168 hours.   DDimer No results for input(s): DDIMER in the last 168 hours.   Radiology    Dg Chest 2 View  Result Date: 07/18/2018 CLINICAL DATA:  ICD in place. EXAM: CHEST - 2 VIEW COMPARISON:  07/09/2018 FINDINGS: New left anterior chest wall biventricular cardioverter-defibrillator. Leads are well-positioned. No pneumothorax. Stable changes from priors cardiac surgery. No mediastinal or hilar masses. Clear lungs. IMPRESSION: 1. New left anterior chest wall biventricular cardioverter defibrillator with well-positioned leads. 2. No pneumothorax.  No acute cardiopulmonary disease. Electronically Signed   By: Lajean Manes M.D.   On: 07/18/2018 09:02    Cardiac Studies     Patient Profile     63 y.o. male with a history of nonischemic cardiomyopathy status post VF arrest.  He is now post [employer] CRT-D implant.  Assessment & Plan    1.  Nonischemic cardiomyopathy: Status post [employer] CRT-D.  Device  functioning appropriately.  Chest x-ray and interrogation without issue.  Plan for discharge today with device clinic follow-up.  For questions or updates, please contact [facility] Please consult [email or web address] for contact info under        Signed, Elexius Minar Jorja Loa, MD  07/18/2018, 9:34 AM

## 2018-07-19 ENCOUNTER — Other Ambulatory Visit: Payer: Self-pay

## 2018-07-19 ENCOUNTER — Emergency Department (HOSPITAL_COMMUNITY)
Admission: EM | Admit: 2018-07-19 | Discharge: 2018-07-19 | Disposition: A | Discharge status: Home / Self Care | Payer: BC Managed Care – PPO | Attending: Emergency Medicine | Admitting: Emergency Medicine

## 2018-07-19 ENCOUNTER — Encounter (HOSPITAL_COMMUNITY): Payer: Self-pay | Admitting: Emergency Medicine

## 2018-07-19 DIAGNOSIS — J9601 Acute respiratory failure with hypoxia: Secondary | ICD-10-CM | POA: Diagnosis not present

## 2018-07-19 DIAGNOSIS — Z5189 Encounter for other specified aftercare: Secondary | ICD-10-CM

## 2018-07-19 DIAGNOSIS — Z7982 Long term (current) use of aspirin: Secondary | ICD-10-CM | POA: Diagnosis not present

## 2018-07-19 DIAGNOSIS — I469 Cardiac arrest, cause unspecified: Secondary | ICD-10-CM | POA: Diagnosis not present

## 2018-07-19 DIAGNOSIS — Z7901 Long term (current) use of anticoagulants: Secondary | ICD-10-CM | POA: Insufficient documentation

## 2018-07-19 DIAGNOSIS — L7622 Postprocedural hemorrhage and hematoma of skin and subcutaneous tissue following other procedure: Secondary | ICD-10-CM | POA: Diagnosis not present

## 2018-07-19 DIAGNOSIS — I350 Nonrheumatic aortic (valve) stenosis: Secondary | ICD-10-CM | POA: Diagnosis not present

## 2018-07-19 DIAGNOSIS — Z86711 Personal history of pulmonary embolism: Secondary | ICD-10-CM | POA: Diagnosis not present

## 2018-07-19 DIAGNOSIS — I472 Ventricular tachycardia: Secondary | ICD-10-CM | POA: Diagnosis not present

## 2018-07-19 DIAGNOSIS — Z950 Presence of cardiac pacemaker: Secondary | ICD-10-CM | POA: Diagnosis not present

## 2018-07-19 DIAGNOSIS — Z4801 Encounter for change or removal of surgical wound dressing: Secondary | ICD-10-CM | POA: Diagnosis not present

## 2018-07-19 MED ORDER — OXYCODONE-ACETAMINOPHEN 5-325 MG PO TABS
1.0000 | ORAL_TABLET | Freq: Once | ORAL | Status: AC
  Administered 2018-07-19: 1 via ORAL
  Filled 2018-07-19: qty 1

## 2018-07-19 NOTE — ED Notes (Signed)
Patients wife EHSAN CORVIN [phone] CALL  FOR UPDATES

## 2018-07-19 NOTE — Discharge Instructions (Addendum)
Please apply ice to the wound.  We suspect the bleeding was because a scab might have come loose. Continue using all your medications as prescribed and keep the wound clean and dry.

## 2018-07-19 NOTE — ED Provider Notes (Signed)
[facility] EMERGENCY DEPARTMENT Provider Note   CSN: [id number] Arrival date & time: 07/19/18  0600     History   Chief Complaint Chief Complaint  Patient presents with  . Post-op Problem    HPI Kevin Rojas is a 63 y.o. male.     HPI 63 year old with history of aortic insufficiency comes in a chief complaint of bleeding.  Patient had a pacemaker placed yesterday.  He reports he woke up with shirt soaked in blood, and got concerned that his wires were removed.  He is taking Xarelto.  Bleeding is stopped with direct pressure.  Past Medical History:  Diagnosis Date  . Aortic insufficiency   . Cardiac arrest (HCC) 03/24/2018  . Dysrhythmia   . History of kidney stones   . Pneumonia    history of  . Pulmonary embolism (HCC)    04/04/18  . S/P AVR (aortic valve replacement)    Echocardiogram 07/03/2018: EF 45-50, septal-lateral dyssynchrony, mild LVH, grade 2 diastolic dysfunction, normal RVSF, mild MR, bioprosthetic AoV with no regurgitation and mildly elevated mean gradient at 18, mild dilation of the ascending aorta (42), PASP 21  . Thoracic aortic aneurysm (HCC) 06/24/2018   CT 03/2018:  Ao root 4.2 cm; ascending Aorta 4.3 cm    Patient Active Problem List   Diagnosis Date Noted  . History of implantable cardioverter-defibrillator (ICD) placement 07/18/2018  . Thoracic aortic aneurysm (HCC) 06/24/2018  . S/P AVR (aortic valve replacement) 06/08/2018  . Dyspepsia --felt to be due to dysmotility 04/10/2018  . Aortic regurgitation due to bicuspid aortic valve 04/10/2018  . Cognitive disorder   . Constipation 04/07/2018  . Debility 04/05/2018  . Acute pulmonary embolism (HCC) 04/05/2018  . Acute respiratory failure with hypoxia (HCC)   . Aspiration into airway   . Severe aortic insufficiency 03/26/2018  . Ventricular fibrillation (HCC) 03/24/2018  . LBBB (left bundle branch block) 03/24/2018  . Elevated troponin 03/24/2018  . Hypokalemia 03/24/2018   . Endotracheally intubated 03/24/2018  . Abnormal CXR 03/24/2018    Past Surgical History:  Procedure Laterality Date  . AORTIC ARCH ANGIOGRAPHY N/A 04/02/2018   Procedure: AORTIC ARCH ANGIOGRAPHY;  Surgeon: Lennette Bihari, MD;  Location: [facility] INVASIVE CV LAB;  Service: Cardiovascular;  Laterality: N/A;  . AORTIC VALVE REPLACEMENT N/A 06/08/2018   Procedure: AORTIC VALVE REPLACEMENT (AVR), ON PUMP, USING MAGNA EASE AORTIC BIOPROSTHESIS VALVE ;  Surgeon: Delight Ovens, MD;  Location: [facility] OR;  Service: Open Heart Surgery;  Laterality: N/A;  . PACEMAKER INSERTION    . RIGHT/LEFT HEART CATH AND CORONARY ANGIOGRAPHY N/A 04/02/2018   Procedure: RIGHT/LEFT HEART CATH AND CORONARY ANGIOGRAPHY;  Surgeon: Lennette Bihari, MD;  Location: [facility] INVASIVE CV LAB;  Service: Cardiovascular;  Laterality: N/A;  . TEE WITHOUT CARDIOVERSION N/A 06/08/2018   Procedure: TRANSESOPHAGEAL ECHOCARDIOGRAM (TEE);  Surgeon: Delight Ovens, MD;  Location: [facility] OR;  Service: Open Heart Surgery;  Laterality: N/A;        Home Medications    Prior to Admission medications   Medication Sig Start Date End Date Taking? Authorizing Provider  acetaminophen (TYLENOL) 325 MG tablet Take 1-2 tablets (325-650 mg total) by mouth every 4 (four) hours as needed for mild pain. 07/18/18  Yes Georgie Chard D, NP  amiodarone (PACERONE) 200 MG tablet Take 1 tablet (200 mg total) by mouth daily. 07/07/18  Yes Marinus Maw, MD  aspirin EC 81 MG tablet Take 1 tablet (81 mg total) by mouth daily.  06/14/18  Yes Lars Pinks M, PA-C  metoprolol tartrate (LOPRESSOR) 25 MG tablet Take 0.5 tablets (12.5 mg total) by mouth 2 (two) times daily. 07/18/18  Yes Kathyrn Drown D, NP  oxyCODONE (OXY IR/ROXICODONE) 5 MG immediate release tablet Take 1 tablet (5 mg total) by mouth every 4 (four) hours as needed for severe pain. 06/13/18  Yes Lars Pinks M, PA-C  rivaroxaban (XARELTO) 20 MG TABS tablet Take 20 mg by mouth daily with supper.    Yes [provider]  senna-docusate (SENOKOT-S) 8.6-50 MG tablet Take 1 tablet by mouth at bedtime as needed for mild constipation. 04/13/18  Yes Daune Perch, NP  metoCLOPramide (REGLAN) 5 MG tablet Take 1 tablet (5 mg total) by mouth 4 (four) times daily -  before meals and at bedtime. Patient not taking: Reported on 07/19/2018 04/13/18 07/19/27  Daune Perch, NP    Family History Family History  Problem Relation Age of Onset  . Pulmonary embolism Mother   . Prostate cancer Father        passed awary at 32    Social History Social History   Tobacco Use  . Smoking status: Never Smoker  . Smokeless tobacco: Never Used  Substance Use Topics  . Alcohol use: Never    Frequency: Never  . Drug use: Never     Allergies   Patient has no known allergies.   Review of Systems Review of Systems  Skin: Positive for wound.  Hematological: Bruises/bleeds easily.     Physical Exam Updated Vital Signs BP (!) 148/118   Pulse 79   Temp 99.3 F (37.4 C) (Oral)   Resp 20   Ht 5\' 9"  (1.753 m)   Wt 65.9 kg   SpO2 96%   BMI 21.44 kg/m   Physical Exam Vitals signs reviewed.  Constitutional:      Appearance: He is well-developed.  HENT:     Head: Atraumatic.  Neck:     Musculoskeletal: Neck supple.  Cardiovascular:     Rate and Rhythm: Normal rate.  Pulmonary:     Effort: Pulmonary effort is normal.  Skin:    General: Skin is warm.  Neurological:     Mental Status: He is alert and oriented to person, place, and time.      ED Treatments / Results  Labs (all labs ordered are listed, but only abnormal results are displayed) Labs Reviewed - No data to display  EKG None  Radiology Dg Chest 2 View  Result Date: 07/18/2018 CLINICAL DATA:  ICD in place. EXAM: CHEST - 2 VIEW COMPARISON:  07/09/2018 FINDINGS: New left anterior chest wall biventricular cardioverter-defibrillator. Leads are well-positioned. No pneumothorax. Stable changes from priors cardiac  surgery. No mediastinal or hilar masses. Clear lungs. IMPRESSION: 1. New left anterior chest wall biventricular cardioverter defibrillator with well-positioned leads. 2. No pneumothorax.  No acute cardiopulmonary disease. Electronically Signed   By: Lajean Manes M.D.   On: 07/18/2018 09:02    Procedures Procedures (including critical care time)  Medications Ordered in ED Medications  oxyCODONE-acetaminophen (PERCOCET/ROXICET) 5-325 MG per tablet 1 tablet (1 tablet Oral Given 07/19/18 8416)     Initial Impression / Assessment and Plan / ED Course  I have reviewed the triage vital signs and the nursing notes.  Pertinent labs & imaging results that were available during my care of the patient were reviewed by me and considered in my medical decision making (see chart for details).        63 year old comes  in a chief complaint of bleeding from his surgical wound.  He had a pacemaker placed yesterday.  He is on Xarelto.  With direct pressure his bleeding is stopped.  We have applied ice pack to the area.  He is stable for discharge.  Final Clinical Impressions(s) / ED Diagnoses   Final diagnoses:  Visit for wound check    ED Discharge Orders    None       Derwood KaplanNanavati, Jacobi Nile, MD 07/19/18 [phone]

## 2018-07-19 NOTE — ED Triage Notes (Addendum)
Pt discharged yesterday afternoon after having pacemaker placed by Dr. Lovena Le. Pt with c/o of continued bleeding from the site starting this morning.

## 2018-07-19 NOTE — ED Notes (Signed)
Patient verbalizes understanding of discharge instructions. Opportunity for questioning and answers were provided. Armband removed by staff, pt discharged from ED home via POV.  

## 2018-07-20 ENCOUNTER — Telehealth: Payer: Self-pay | Admitting: Cardiovascular Disease

## 2018-07-20 ENCOUNTER — Encounter (HOSPITAL_COMMUNITY): Payer: Self-pay | Admitting: Internal Medicine

## 2018-07-20 ENCOUNTER — Telehealth: Payer: Self-pay | Admitting: Internal Medicine

## 2018-07-20 NOTE — Telephone Encounter (Signed)
Pt's wife calling requesting a refill on Oxycodone, stating that pt is still in a lot of pain and would like for his pain medication to be refilled. Please address

## 2018-07-20 NOTE — Telephone Encounter (Signed)
New Message    Patient calling to see if disability papers have been sent over and filled out by the doctor yet.  Please call patient back.

## 2018-07-20 NOTE — Telephone Encounter (Signed)
See mychart thread

## 2018-07-21 ENCOUNTER — Telehealth: Payer: Self-pay

## 2018-07-21 ENCOUNTER — Other Ambulatory Visit: Payer: Self-pay | Admitting: Internal Medicine

## 2018-07-21 ENCOUNTER — Telehealth (HOSPITAL_COMMUNITY): Payer: Self-pay

## 2018-07-21 NOTE — Telephone Encounter (Signed)
Pt insurance is active and benefits verified through [employer]. Co-pay $0.00, DED $2,500.00/$2,500.00 met, out of pocket $7,500.00/$7,500.00 met, co-insurance 0%. No pre-authorization required. Passport, 07/21/2018 @ 11:07AM, GOV#[id number]  patient will need to complete follow up appt. Once completed, patient will be contacted for scheduling upon review by the RN Navigator.

## 2018-07-21 NOTE — Telephone Encounter (Signed)
Patient's wife contacted office requesting a refill of patient's pain medication.  She stated that he had an ICD placed and is in pain.  I advised her to contact Dr. Tanna Furry office to request medication as our physicians did not place the ICD.  She stated that Dr. Lovena Le stated he would not prescribe narcotics, only for patient to take Tylenol.  She stated that patient was having trouble sleeping.  I advised that he could take an over-the-counter sleep aid as bottle prescribed.  She acknowledged receipt.

## 2018-07-27 ENCOUNTER — Telehealth: Payer: Self-pay

## 2018-07-27 NOTE — Telephone Encounter (Signed)

## 2018-07-28 ENCOUNTER — Other Ambulatory Visit: Payer: Self-pay

## 2018-07-28 ENCOUNTER — Ambulatory Visit (INDEPENDENT_AMBULATORY_CARE_PROVIDER_SITE_OTHER): Payer: BC Managed Care – PPO | Admitting: Student

## 2018-07-28 DIAGNOSIS — I5022 Chronic systolic (congestive) heart failure: Secondary | ICD-10-CM | POA: Diagnosis not present

## 2018-07-28 LAB — CUP PACEART INCLINIC DEVICE CHECK
Date Time Interrogation Session: 20200721040000
HighPow Impedance: 56 Ohm
Implantable Lead Implant Date: 20200710
Implantable Lead Implant Date: 20200710
Implantable Lead Implant Date: 20200710
Implantable Lead Location: 753859
Implantable Lead Location: 753860
Implantable Lead Location: 753862
Implantable Lead Model: 293
Implantable Lead Model: 4677
Implantable Lead Model: 7841
Implantable Lead Serial Number: 1006750
Implantable Lead Serial Number: 443715
Implantable Lead Serial Number: 814245
Implantable Pulse Generator Implant Date: 20200710
Lead Channel Impedance Value: 431 Ohm
Lead Channel Impedance Value: 519 Ohm
Lead Channel Impedance Value: 592 Ohm
Lead Channel Pacing Threshold Amplitude: 0.6 V
Lead Channel Pacing Threshold Amplitude: 0.9 V
Lead Channel Pacing Threshold Amplitude: 2.6 V
Lead Channel Pacing Threshold Pulse Width: 0.4 ms
Lead Channel Pacing Threshold Pulse Width: 0.4 ms
Lead Channel Pacing Threshold Pulse Width: 0.4 ms
Lead Channel Sensing Intrinsic Amplitude: 16.2 mV
Lead Channel Sensing Intrinsic Amplitude: 25 mV
Lead Channel Sensing Intrinsic Amplitude: 3.5 mV
Lead Channel Setting Pacing Amplitude: 3.5 V
Lead Channel Setting Pacing Amplitude: 3.5 V
Lead Channel Setting Pacing Amplitude: 3.5 V
Lead Channel Setting Pacing Pulse Width: 0.4 ms
Lead Channel Setting Pacing Pulse Width: 0.4 ms
Lead Channel Setting Sensing Sensitivity: 0.5 mV
Lead Channel Setting Sensing Sensitivity: 1 mV
Pulse Gen Serial Number: 228146

## 2018-07-28 NOTE — Progress Notes (Signed)
Wound check appointment. Steri-strips removed. Wound without redness. Hematoma noted but stable and improving. Incision edges approximated, wound well healed. Normal device function. Thresholds, sensing, and impedances consistent with implant measurements. Device programmed at 3.5V for extra safety margin until 3 month visit. Histogram distribution appropriate for patient and level of activity. Pt LV pacing 100%, No mode switches or ventricular arrhythmias noted. Patient educated about wound care, arm mobility, lifting restrictions, shock plan. ROV in 3 months with Dr. Lovena Le. Next remote 11/18/2018   Kevin Rojas" Barney, PA-C 07/28/2018 11:46 AM

## 2018-07-30 ENCOUNTER — Encounter (HOSPITAL_COMMUNITY): Payer: Self-pay | Admitting: *Deleted

## 2018-07-30 NOTE — Progress Notes (Signed)
Clinical review of pt follow up appt on 07/28/18 EP office note. Pt is making the expected progress in recovery.  Pt is appropriate to schedule for cardiac rehab.  Will forward to support staff for contact this pt to schedule for cardiac rehab.

## 2018-07-31 ENCOUNTER — Other Ambulatory Visit: Payer: Self-pay

## 2018-07-31 ENCOUNTER — Ambulatory Visit (INDEPENDENT_AMBULATORY_CARE_PROVIDER_SITE_OTHER): Payer: BC Managed Care – PPO | Admitting: *Deleted

## 2018-07-31 ENCOUNTER — Telehealth: Payer: Self-pay | Admitting: Cardiovascular Disease

## 2018-07-31 ENCOUNTER — Telehealth: Payer: Self-pay | Admitting: Student

## 2018-07-31 DIAGNOSIS — I469 Cardiac arrest, cause unspecified: Secondary | ICD-10-CM

## 2018-07-31 NOTE — Patient Instructions (Addendum)
Hold Xarelto and ASA  until f/u in device clinic for wound check 08/07/18.

## 2018-07-31 NOTE — Telephone Encounter (Signed)
New Message  Patient calling in because his incision from his pacemaker being put in is bleeding and patient would like to know what he should put on it to help. States that he put a Gause pad and ice on it.  Anytime he sleeps on that side it bleeds. Please give patient a call back.

## 2018-07-31 NOTE — Telephone Encounter (Signed)
Pt c/o pain and blood oozing from wound site. No fever. Pt to come into DC for check today.   Pt pre-screened for Covid-19.

## 2018-07-31 NOTE — Telephone Encounter (Signed)
New Message    1. Are you calling in reference to your FMLA or disability form? Yes  2. What is your question in regards to FMLA or disability form? N/A   3. Do you need copies of your medical records? Yes  4. Are you waiting on a nurse to call you back with results or are you wanting copies of your results? Wanting a copy of the medical records from the past 2 procedures that were performed    Please route to Medical Records or your medical records site representative

## 2018-07-31 NOTE — Progress Notes (Signed)
Wound edges approxmated.Dressing D & I, removed and dried blood present on inside of dressing. Small pin point scab noted on medial aspect of incision site with no drainage present.. Hematoma present at wound site. Pt reports edema less than at previous visit. Wound assessed by Dr Lovena Le. Patient to hold Xarelto and ASA and return for f/u for wound re-check 08/07/18 at 1000 am in DC. Education done concerning seeking tx for fever, bleeding, increased edema or drainage.

## 2018-08-07 ENCOUNTER — Telehealth (HOSPITAL_COMMUNITY): Payer: Self-pay

## 2018-08-07 ENCOUNTER — Other Ambulatory Visit: Payer: Self-pay

## 2018-08-07 ENCOUNTER — Ambulatory Visit (INDEPENDENT_AMBULATORY_CARE_PROVIDER_SITE_OTHER): Payer: BC Managed Care – PPO | Admitting: *Deleted

## 2018-08-07 DIAGNOSIS — I469 Cardiac arrest, cause unspecified: Secondary | ICD-10-CM

## 2018-08-07 DIAGNOSIS — Z95810 Presence of automatic (implantable) cardiac defibrillator: Secondary | ICD-10-CM

## 2018-08-07 NOTE — Progress Notes (Signed)
Patient seen in [facility] for hematoma reassessment with Dr. Lovena Le. Incision edges fully approximated and healing well. Hematoma continues to improve, smaller and softer than at visit on 07/31/18. Per Dr. Lovena Le, patient should continue to hold Xarelto and ASA and return for wound recheck on 08/14/18 with Tommye Standard, PA. Patient verbalizes understanding and is aware to call our office in the interim for any signs/symptoms of bleeding or infection.

## 2018-08-07 NOTE — Patient Instructions (Addendum)
Continue to hold (DO NOT TAKE) your Xarelto or aspirin until instructed that it is safe to resume these medications.   Follow-up with Tommye Standard, PA, on 08/14/18 at 10:15am.

## 2018-08-12 DIAGNOSIS — Z736 Limitation of activities due to disability: Secondary | ICD-10-CM

## 2018-08-13 NOTE — Progress Notes (Deleted)
Cardiology Office Note Date:  08/13/2018  Patient ID:  Kevin Rojas 1955/03/07, MRN [id number] PCP:  Carol Ada, MD  Cardiologist:  Dr. Burt Knack Electrophysiologist: Dr. Lovena Le  ***refresh   Chief Complaint: *** f/u on hematoma progress  History of Present Illness: Kevin Rojas is a 63 y.o. male with history of LBBB, renal calculi,  h/o VF arrest severe AI, dilated Ao root, ascending AO s/p AVR, NICM.  He was admitted to [facility] (03/24/2018) after suffering anV. fib cardiac arrestat home.  He was treated with CPR x3 minutes by family as well as defibrillation by EMS. He was intubated and treated with cooling protocol. He was subsequently extubated, w/u noted no CAD, a bicuspid AV with dilated ascending AO/root, and severe AI. LVEF by LV gram at cath 45-50%, by initial echo was the same 45-50% Also noted later in the same stay incidentally to have a PE and started on Xarelto.   He was planned to discharge to rehab and return afterwards for AV surgery. EP saw him durng his stay with plans to discharge with lifevest and revisit ICD post AVR.  06/08/2018 underwent AORTIC VALVE REPLACEMENT (AVR), ON PUMP, USING MAGNA EASE AORTIC BIOPROSTHESIS VALVE 25MM (N/A) He had interop AFib treated with amio gtt, he required epicardial pacing iinitially and EP brought on board, transitioned to PO amiodarone, regained 1;1 conduction and planned to allow him to recover from surgery with Life vest and plan ICD out patient.  He underwent CRT-D implant 07/17/2018 discharged 07/18/2018.  He went to the ER 7/12 with site bleeding, unfortunately developed hematoma.  At his wound check visit 7/21, hematoma was noted but stable, skin edges were healed, no evidence of infection. 7/24 pt reported bleeding, was seen in the office, pin point scab noted on medial aspect of incision site with no drainage present.. Hematoma present at wound site. Pt reports edema less than at previous visit, evaluated by dr.  Lovena Le, hold xarelto and revisit.  7/31 seen and felt to be making continued progress, continued off Xarelto and ASA to follow up today  *** site *** fever, drainage, infection? *** xarelto, ASA restart? *** meds *** labs  Device information BSCi CRT-D implanted 07/17/2018, secondary prevention   Past Medical History:  Diagnosis Date  . Aortic insufficiency   . Cardiac arrest ([place]) 03/24/2018  . Dysrhythmia   . History of kidney stones   . Pneumonia    history of  . Pulmonary embolism ([place])    04/04/18  . S/P AVR (aortic valve replacement)    Echocardiogram 07/03/2018: EF 45-50, septal-lateral dyssynchrony, mild LVH, grade 2 diastolic dysfunction, normal RVSF, mild MR, bioprosthetic AoV with no regurgitation and mildly elevated mean gradient at 18, mild dilation of the ascending aorta (42), PASP 21  . Thoracic aortic aneurysm ([place]) 06/24/2018   CT 03/2018:  Ao root 4.2 cm; ascending Aorta 4.3 cm    Past Surgical History:  Procedure Laterality Date  . AORTIC ARCH ANGIOGRAPHY N/A 04/02/2018   Procedure: AORTIC ARCH ANGIOGRAPHY;  Surgeon: Troy Sine, MD;  Location: [place] [facility];  Service: Cardiovascular;  Laterality: N/A;  . AORTIC VALVE REPLACEMENT N/A 06/08/2018   Procedure: AORTIC VALVE REPLACEMENT (AVR), ON PUMP, USING MAGNA EASE AORTIC BIOPROSTHESIS VALVE 25MM;  Surgeon: Grace Isaac, MD;  Location: [place];  Service: Open Heart Surgery;  Laterality: N/A;  . BIV ICD INSERTION CRT-D N/A 07/17/2018   Procedure: BIV ICD INSERTION CRT-D;  Surgeon: Evans Lance, MD;  [facility];  Service: Cardiovascular;  Laterality: N/A;  . PACEMAKER INSERTION    . RIGHT/LEFT HEART CATH AND CORONARY ANGIOGRAPHY N/A 04/02/2018   Procedure: RIGHT/LEFT HEART CATH AND CORONARY ANGIOGRAPHY;  Surgeon: Lennette BihariKelly, Thomas A, MD;  [facility]: [facility];  Service: Cardiovascular;  Laterality: N/A;  . TEE WITHOUT CARDIOVERSION N/A 06/08/2018   Procedure: TRANSESOPHAGEAL  ECHOCARDIOGRAM (TEE);  Surgeon: Delight OvensGerhardt, Edward B, MD;  [facility];  Service: Open Heart Surgery;  Laterality: N/A;    Current Outpatient Medications  Medication Sig Dispense Refill  . acetaminophen (TYLENOL) 325 MG tablet Take 1-2 tablets (325-650 mg total) by mouth every 4 (four) hours as needed for mild pain.    Marland Kitchen. amiodarone (PACERONE) 200 MG tablet Take 1 tablet (200 mg total) by mouth daily. 90 tablet 0  . aspirin EC 81 MG tablet Take 1 tablet (81 mg total) by mouth daily. (Patient not taking: Reported on 08/07/2018)    . metoCLOPramide (REGLAN) 5 MG tablet Take 1 tablet (5 mg total) by mouth 4 (four) times daily -  before meals and at bedtime. 120 tablet 1  . metoprolol tartrate (LOPRESSOR) 25 MG tablet Take 0.5 tablets (12.5 mg total) by mouth 2 (two) times daily. 120 tablet 3  . oxyCODONE (OXY IR/ROXICODONE) 5 MG immediate release tablet Take 1 tablet (5 mg total) by mouth every 4 (four) hours as needed for severe pain. 30 tablet 0  . rivaroxaban (XARELTO) 20 MG TABS tablet Take 20 mg by mouth daily with supper.    . senna-docusate (SENOKOT-S) 8.6-50 MG tablet Take 1 tablet by mouth at bedtime as needed for mild constipation. 30 tablet 1   No current facility-administered medications for this visit.     Allergies:   Patient has no known allergies.   Social History:  The patient  reports that he has never smoked. He has never used smokeless tobacco. He reports that he does not drink alcohol or use drugs.   Family History:  The patient's family history includes Prostate cancer in his father; Pulmonary embolism in his mother.  ROS:  Please see the history of present illness.  All other systems are reviewed and otherwise negative.   PHYSICAL EXAM: *** VS:  There were no vitals taken for this visit. BMI: There is no height or weight on file to calculate BMI. Well nourished, well developed, in no acute distress  HEENT: normocephalic, atraumatic  Neck: no JVD, carotid bruits or  masses Cardiac:  *** RRR; no significant murmurs, no rubs, or gallops Lungs:  *** CTA b/l, no wheezing, rhonchi or rales  Abd: soft, nontender MS: no deformity or atrophy Ext: *** no edema  Skin: warm and dry, no rash Neuro:  No gross deficits appreciated Psych: euthymic mood, full affect  *** /ICD site is stable,***  no tethering or discomfort   EKG:  Not done today  07/02/2018: TTE IMPRESSIONS  1. The left ventricle has mildly reduced systolic function, with an ejection fraction of 45-50%. Marked septal-lateral dyssynchrony. The cavity size was normal. There is mildly increased left ventricular wall thickness. Grade II diastolic dysfunction.  2. The right ventricle has normal systolic function. The cavity was normal. There is no increase in right ventricular wall thickness.  3. No evidence of mitral valve stenosis. Mild mitral regurgitation.  4. Bioprosthetic aortic valve. No significant regurgitation. Mildly elevated mean gradient at 18 mmHg.  5. There is mild dilatation of the ascending aorta measuring 42 mm.  6. Normal  IVC size. PA systolic pressure 21 mmHg.  FINDINGS  Left Ventricle: The left ventricle has mildly reduced systolic function, with an ejection fraction of 45-50%. The cavity size was normal. There is mildly increased left ventricular wall thickness. Left ventricular diastolic Doppler parameters are  consistent with pseudonormalization.  Right Ventricle: The right ventricle has normal systolic function. The cavity was normal. There is no increase in right ventricular wall thickness.  Left Atrium: Left atrial size was normal in size.  Right Atrium: Right atrial size was normal in size.  Interatrial Septum: No atrial level shunt detected by color flow Doppler.  Pericardium: There is no evidence of pericardial effusion.  Mitral Valve: The mitral valve is normal in structure. Mitral valve regurgitation is mild by color flow Doppler. No evidence of mitral valve  stenosis.  Tricuspid Valve: The tricuspid valve is normal in structure. Tricuspid valve regurgitation is trivial by color flow Doppler.  Aortic Valve: The aortic valve has been repaired/replaced Aortic valve regurgitation was not visualized by color flow Doppler. Bioprosthetic aortic valve.  Pulmonic Valve: The pulmonic valve was normal in structure. Pulmonic valve regurgitation is trivial by color flow Doppler.  Aorta: There is mild dilatation of the ascending aorta measuring 42 mm.  Venous: The inferior vena cava is normal in size with greater than 50% respiratory variability.    04/02/2018: R/LHC  There is mild left ventricular systolic dysfunction.  LV end diastolic pressure is normal.  The left ventricular ejection fraction is 45-50% by visual estimate.  There is severe (4+) aortic regurgitation.   Normal right heart pressures. Mild global LV dysfunction with an EF 45 to 50%. Bicuspid aortic valve with severe 4+ aortic insufficiency, mild aortic stenosis. Dilated aortic root. Normal coronary arteries and a left dominant circulation.    Recent Labs: 06/03/2018: ALT 16 06/12/2018: Magnesium 2.2; TSH 0.920 06/30/2018: BUN 12; Creatinine, Ser 1.38; Hemoglobin 11.4; Platelets 471; Potassium 4.9; Sodium 138  No results found for requested labs within last 8760 hours.   CrCl cannot be calculated (Patient's most recent lab result is older than the maximum 21 days allowed.).   Wt Readings from Last 3 Encounters:  07/19/18 145 lb 3.2 oz (65.9 kg)  07/18/18 145 lb 3.2 oz (65.9 kg)  07/09/18 149 lb (67.6 kg)     Other studies reviewed: Additional studies/records reviewed today include: summarized above  ASSESSMENT AND PLAN:  1. H/o VF arrest 2. CRT-D     Post implant site hematoma     ***     *** intact device function  3. H/o PE     *** xarelto  4. VHD s/p AVR, Bioprosthetic     ***     Disposition: F/u with ***  Current medicines are reviewed at length  with the patient today.  The patient did not have any concerns regarding medicines.***  Signed, Francis Dowse, PA-C 08/13/2018 7:31 PM     [facility] [address] [place] [address] [phone] (office)  [phone] (fax)

## 2018-08-14 ENCOUNTER — Ambulatory Visit (INDEPENDENT_AMBULATORY_CARE_PROVIDER_SITE_OTHER): Payer: BC Managed Care – PPO | Admitting: Physician Assistant

## 2018-08-14 ENCOUNTER — Other Ambulatory Visit: Payer: Self-pay

## 2018-08-14 ENCOUNTER — Encounter: Payer: Self-pay | Admitting: Physician Assistant

## 2018-08-14 ENCOUNTER — Encounter: Payer: BC Managed Care – PPO | Admitting: Physician Assistant

## 2018-08-14 VITALS — BP 130/91 | HR 59 | Ht 69.0 in | Wt 149.0 lb

## 2018-08-14 DIAGNOSIS — I2782 Chronic pulmonary embolism: Secondary | ICD-10-CM

## 2018-08-14 DIAGNOSIS — Z95810 Presence of automatic (implantable) cardiac defibrillator: Secondary | ICD-10-CM | POA: Diagnosis not present

## 2018-08-14 DIAGNOSIS — R9389 Abnormal findings on diagnostic imaging of other specified body structures: Secondary | ICD-10-CM | POA: Diagnosis not present

## 2018-08-14 DIAGNOSIS — Z952 Presence of prosthetic heart valve: Secondary | ICD-10-CM

## 2018-08-14 DIAGNOSIS — T148XXA Other injury of unspecified body region, initial encounter: Secondary | ICD-10-CM | POA: Diagnosis not present

## 2018-08-14 NOTE — Patient Instructions (Addendum)
Medication Instructions:   Your physician recommends that you continue on your current medications as directed. Please refer to the Current Medication list given to you today.  If you need a refill on your cardiac medications before your next appointment, please call your pharmacy.    Lab work: NONE ORDERED  TODAY   If you have labs (blood work) drawn today and your tests are completely normal, you will receive your results only by: Marland Kitchen MyChart Message (if you have MyChart) OR . A paper copy in the mail If you have any lab test that is abnormal or we need to change your treatment, we will call you to review the results.  Testing/Procedures:   Follow-Up:    IN 2 WEEKS WITH DEVICE CLINIC     D Any Other Special Instructions Will Be Listed Below (If Applicable).   Increase daily water intake to at least 8 cups a day  Which is about 4 bottles

## 2018-08-14 NOTE — Progress Notes (Signed)
Cardiology Office Note Date:  08/14/2018  Patient ID:  Kevin Rojas, Kevin Rojas 03-28-1955, MRN [id number] PCP:  Carol Ada, MD  Cardiologist:  Dr. Burt Knack Electrophysiologist: Dr. Lovena Le    Chief Complaint:  f/u on hematoma progress  History of Present Illness: Kevin Rojas is a 63 y.o. male with history of LBBB, renal calculi,  h/o VF arrest severe AI, dilated Ao root, ascending AO s/p AVR, NICM.  He was admitted to [facility] (03/24/2018) after suffering anV. fib cardiac arrestat home.  He was treated with CPR x3 minutes by family as well as defibrillation by EMS. He was intubated and treated with cooling protocol. He was subsequently extubated, w/u noted no CAD, a bicuspid AV with dilated ascending AO/root, and severe AI. LVEF by LV gram at cath 45-50%, by initial echo was the same 45-50% Also noted later in the same stay incidentally to have a PE and started on Xarelto.   He was planned to discharge to rehab and return afterwards for AV surgery. EP saw him durng his stay with plans to discharge with lifevest and revisit ICD post AVR.  06/08/2018 underwent AORTIC VALVE REPLACEMENT (AVR), ON PUMP, USING MAGNA EASE AORTIC BIOPROSTHESIS VALVE 25MM (N/A) He had interop AFib treated with amio gtt, he required epicardial pacing iinitially and EP brought on board, transitioned to PO amiodarone, regained 1;1 conduction and planned to allow him to recover from surgery with Life vest and plan ICD out patient.  He underwent CRT-D implant 07/17/2018 discharged 07/18/2018.  He went to the ER 7/12 with site bleeding, unfortunately developed hematoma.  At his wound check visit 7/21, hematoma was noted but stable, skin edges were healed, no evidence of infection. 7/24 pt reported bleeding, was seen in the office, pin point scab noted on medial aspect of incision site with no drainage present.. Hematoma present at wound site. Pt reports edema less than at previous visit, evaluated by dr. Lovena Le, hold xarelto  and revisit.  7/31 seen and felt to be making continued progress, continued off Xarelto and ASA to follow up today  He is doing well, for a week or so when he stands he feels briefly "a little weak", not near syncopal, no syncope, just weak for "a bit", resolves quickly.  No CP, palpitations, no SOB, he denies any DOE, no symptoms of PND or orthopnea.  No shocks.  He feels like his hematoma continues to improve slowly, much less discomfort, "not painful".  He denies any fever, symptoms of illness.  He remians off Xarelto.  He is ambulatory, doing his ADLs and some work around the house without difficulty.  He is still out of work, not sure when he will get back.  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 Aortic insufficiency   . Cardiac arrest ([place]) 03/24/2018  . Dysrhythmia   . History of kidney stones   . Pneumonia    history of  . Pulmonary embolism ([place])    04/04/18  . S/P AVR (aortic valve replacement)    Echocardiogram 07/03/2018: EF 45-50, septal-lateral dyssynchrony, mild LVH, grade 2 diastolic dysfunction, normal RVSF, mild MR, bioprosthetic AoV with no regurgitation and mildly elevated mean gradient at 18, mild dilation of the ascending aorta (42), PASP 21  . Thoracic aortic  aneurysm ([facility]) 06/24/2018   CT 03/2018:  Ao root 4.2 cm; ascending Aorta 4.3 cm    Past Surgical History:  Procedure Laterality Date  . AORTIC ARCH ANGIOGRAPHY N/A 04/02/2018   Procedure: AORTIC ARCH ANGIOGRAPHY;  Surgeon: Lennette BihariKelly, Thomas A, MD;  [facility];  Service: Cardiovascular;  Laterality: N/A;  . AORTIC VALVE REPLACEMENT N/A 06/08/2018   Procedure: AORTIC VALVE REPLACEMENT (AVR), ON PUMP, USING MAGNA EASE AORTIC BIOPROSTHESIS VALVE 25MM;   Surgeon: Delight OvensGerhardt, Edward B, MD;  [facility];  Service: Open Heart Surgery;  Laterality: N/A;  . BIV ICD INSERTION CRT-D N/A 07/17/2018   Procedure: BIV ICD INSERTION CRT-D;  Surgeon: Marinus Mawaylor, Gregg W, MD;  [facility];  Service: Cardiovascular;  Laterality: N/A;  . PACEMAKER INSERTION    . RIGHT/LEFT HEART CATH AND CORONARY ANGIOGRAPHY N/A 04/02/2018   Procedure: RIGHT/LEFT HEART CATH AND CORONARY ANGIOGRAPHY;  Surgeon: Lennette BihariKelly, Thomas A, MD;  [facility];  Service: Cardiovascular;  Laterality: N/A;  . TEE WITHOUT CARDIOVERSION N/A 06/08/2018   Procedure: TRANSESOPHAGEAL ECHOCARDIOGRAM (TEE);  Surgeon: Delight OvensGerhardt, Edward B, MD;  [facility];  Service: Open Heart Surgery;  Laterality: N/A;    Current Outpatient Medications  Medication Sig Dispense Refill  . acetaminophen (TYLENOL) 325 MG tablet Take 1-2 tablets (325-650 mg total) by mouth every 4 (four) hours as needed for mild pain.    Marland Kitchen. amiodarone (PACERONE) 200 MG tablet Take 1 tablet (200 mg total) by mouth daily. 90 tablet 0  . aspirin EC 81 MG tablet Take 1 tablet (81 mg total) by mouth daily. (Patient not taking: Reported on 08/07/2018)    . metoCLOPramide (REGLAN) 5 MG tablet Take 1 tablet (5 mg total) by mouth 4 (four) times daily -  before meals and at bedtime. 120 tablet 1  . metoprolol tartrate (LOPRESSOR) 25 MG tablet Take 0.5 tablets (12.5 mg total) by mouth 2 (two) times daily. 120 tablet 3  . oxyCODONE (OXY IR/ROXICODONE) 5 MG immediate release tablet Take 1 tablet (5 mg total) by mouth every 4 (four) hours as needed for severe pain. 30 tablet 0  . rivaroxaban (XARELTO) 20 MG TABS tablet Take 20 mg by mouth daily with supper.    . senna-docusate (SENOKOT-S) 8.6-50 MG tablet Take 1 tablet by mouth at bedtime as needed for mild constipation. 30 tablet 1   No current facility-administered medications for this visit.     Allergies:   Patient has no known allergies.   Social History:  The patient   reports that he has never smoked. He has never used smokeless tobacco. He reports that he does not drink alcohol or use drugs.   Family History:  The patient's family history includes Prostate cancer in his father; Pulmonary embolism in his mother.  ROS:  Please see the history of present illness.  All other systems are reviewed and otherwise negative.   PHYSICAL EXAM:  VS:  BP (!) 130/91   Pulse (!) 59   Ht 5\' 9"  (1.753 m)   Wt 149 lb (67.6 kg)   BMI 22.00 kg/m  BMI: Body mass index is 22 kg/m. Well nourished, well developed, in no acute distress  HEENT: normocephalic, atraumatic  Neck: no JVD, carotid bruits or masses Cardiac:  RRR; no significant murmurs, no rubs, or gallops Lungs:  CTA b/l, no wheezing, rhonchi or rales  Abd: soft, nontender MS: no deformity or atrophy Ext: no edema, no erythema, no calf tenderness Skin: warm  and dry, no rash Neuro:  No gross deficits appreciated Psych: euthymic mood, full affect  CD site soft, + hematoma that the patient states is much improved from the beginning and continues to slowly improve.  I am able to feel the outline of the device, there is still some collection anterior to the device. Incision is well healed, no increased heat to the surrounding tissues, no erythema, no signs of infection, slightly tender (but he says, tyhis is also better)   EKG:  Not done today  ICD interrogation: battery and lead measurements are good.  NO arrhythmias, acute implant outputs remain  07/02/2018: TTE IMPRESSIONS  1. The left ventricle has mildly reduced systolic function, with an ejection fraction of 45-50%. Marked septal-lateral dyssynchrony. The cavity size was normal. There is mildly increased left ventricular wall thickness. Grade II diastolic dysfunction.  2. The right ventricle has normal systolic function. The cavity was normal. There is no increase in right ventricular wall thickness.  3. No evidence of mitral valve stenosis. Mild mitral  regurgitation.  4. Bioprosthetic aortic valve. No significant regurgitation. Mildly elevated mean gradient at 18 mmHg.  5. There is mild dilatation of the ascending aorta measuring 42 mm.  6. Normal IVC size. PA systolic pressure 21 mmHg.  FINDINGS  Left Ventricle: The left ventricle has mildly reduced systolic function, with an ejection fraction of 45-50%. The cavity size was normal. There is mildly increased left ventricular wall thickness. Left ventricular diastolic Doppler parameters are  consistent with pseudonormalization.  Right Ventricle: The right ventricle has normal systolic function. The cavity was normal. There is no increase in right ventricular wall thickness.  Left Atrium: Left atrial size was normal in size.  Right Atrium: Right atrial size was normal in size.  Interatrial Septum: No atrial level shunt detected by color flow Doppler.  Pericardium: There is no evidence of pericardial effusion.  Mitral Valve: The mitral valve is normal in structure. Mitral valve regurgitation is mild by color flow Doppler. No evidence of mitral valve stenosis.  Tricuspid Valve: The tricuspid valve is normal in structure. Tricuspid valve regurgitation is trivial by color flow Doppler.  Aortic Valve: The aortic valve has been repaired/replaced Aortic valve regurgitation was not visualized by color flow Doppler. Bioprosthetic aortic valve.  Pulmonic Valve: The pulmonic valve was normal in structure. Pulmonic valve regurgitation is trivial by color flow Doppler.  Aorta: There is mild dilatation of the ascending aorta measuring 42 mm.  Venous: The inferior vena cava is normal in size with greater than 50% respiratory variability.    04/02/2018: R/LHC  There is mild left ventricular systolic dysfunction.  LV end diastolic pressure is normal.  The left ventricular ejection fraction is 45-50% by visual estimate.  There is severe (4+) aortic regurgitation.   Normal right  heart pressures. Mild global LV dysfunction with an EF 45 to 50%. Bicuspid aortic valve with severe 4+ aortic insufficiency, mild aortic stenosis. Dilated aortic root. Normal coronary arteries and a left dominant circulation.    Recent Labs: 06/03/2018: ALT 16 06/12/2018: Magnesium 2.2; TSH 0.920 06/30/2018: BUN 12; Creatinine, Ser 1.38; Hemoglobin 11.4; Platelets 471; Potassium 4.9; Sodium 138  No results found for requested labs within last 8760 hours.   CrCl cannot be calculated (Patient's most recent lab result is older than the maximum 21 days allowed.).   Wt Readings from Last 3 Encounters:  08/14/18 149 lb (67.6 kg)  07/19/18 145 lb 3.2 oz (65.9 kg)  07/18/18 145 lb 3.2 oz (65.9  kg)     Other studies reviewed: Additional studies/records reviewed today include: summarized above  ASSESSMENT AND PLAN:  1. H/o VF arrest 2. CRT-D     Post implant site hematoma     Continues to improve     intact device function, acute implant outputs remain, no programming changes made  3. 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 VHD s/p AVR, Bioprosthetic      Doing well     Disposition: F/u with device clinic one more time in 2 weeks hopefully will be nearing resolution of his hematoma, Dr.Taylor as scheduled.  I noted after the patient left that the xarelto remained on the patient's med list, I called the patient and confirmed that he understood to stop completely the  xarelto even though appears on his list, he stated understanding.  If his hematoma is improved at his next visit to resume his ASA.   Current medicines are reviewed at length with the patient today.  The patient did not have any concerns regarding medicines.    Norma FredricksonSigned, Craige Patel, PA-C 08/14/2018 4:40 PM     [facility] [address] [place] [id number] [phone] (office)  [phone] (fax)

## 2018-09-01 ENCOUNTER — Telehealth: Payer: Self-pay

## 2018-09-01 ENCOUNTER — Ambulatory Visit (INDEPENDENT_AMBULATORY_CARE_PROVIDER_SITE_OTHER): Payer: BC Managed Care – PPO | Admitting: *Deleted

## 2018-09-01 ENCOUNTER — Other Ambulatory Visit: Payer: Self-pay

## 2018-09-01 DIAGNOSIS — I4901 Ventricular fibrillation: Secondary | ICD-10-CM | POA: Diagnosis not present

## 2018-09-01 LAB — CUP PACEART INCLINIC DEVICE CHECK
Brady Statistic RA Percent Paced: 91 %
Brady Statistic RV Percent Paced: 13 %
Date Time Interrogation Session: 20200825040000
HighPow Impedance: 56 Ohm
Implantable Lead Implant Date: 20200710
Implantable Lead Implant Date: 20200710
Implantable Lead Implant Date: 20200710
Implantable Lead Location: 753858
Implantable Lead Location: 753859
Implantable Lead Location: 753860
Implantable Lead Model: 293
Implantable Lead Model: 4677
Implantable Lead Model: 7841
Implantable Lead Serial Number: 1006750
Implantable Lead Serial Number: 443715
Implantable Lead Serial Number: 814245
Implantable Pulse Generator Implant Date: 20200710
Lead Channel Impedance Value: 480 Ohm
Lead Channel Impedance Value: 614 Ohm
Lead Channel Impedance Value: 627 Ohm
Lead Channel Pacing Threshold Amplitude: 0.6 V
Lead Channel Pacing Threshold Amplitude: 0.7 V
Lead Channel Pacing Threshold Amplitude: 1.7 V
Lead Channel Pacing Threshold Pulse Width: 0.4 ms
Lead Channel Pacing Threshold Pulse Width: 0.4 ms
Lead Channel Pacing Threshold Pulse Width: 0.4 ms
Lead Channel Sensing Intrinsic Amplitude: 24.9 mV
Lead Channel Sensing Intrinsic Amplitude: 25 mV
Lead Channel Sensing Intrinsic Amplitude: 7.4 mV
Lead Channel Setting Pacing Amplitude: 3.5 V
Lead Channel Setting Pacing Amplitude: 3.5 V
Lead Channel Setting Pacing Amplitude: 3.5 V
Lead Channel Setting Pacing Pulse Width: 0.4 ms
Lead Channel Setting Pacing Pulse Width: 0.4 ms
Lead Channel Setting Sensing Sensitivity: 0.5 mV
Lead Channel Setting Sensing Sensitivity: 1 mV
Pulse Gen Serial Number: 228146

## 2018-09-01 NOTE — Telephone Encounter (Signed)
    COVID-19 Pre-Screening Questions:  . In the past 7 to 10 days have you had a cough,  shortness of breath, headache, congestion, fever (100 or greater) body aches, chills, sore throat, or sudden loss of taste or sense of smell? No . Have you been around anyone with known Covid 19. No . Have you been around anyone who is awaiting Covid 19 test results in the past 7 to 10 days? No . Have you been around anyone who has been exposed to Covid 19, or has mentioned symptoms of Covid 19 within the past 7 to 10 days? No  If you have any concerns/questions about symptoms patients report during screening (either on the phone or at threshold). Contact the provider seeing the patient or DOD for further guidance.  If neither are available contact a member of the leadership team.          The pt answered No to all Covid-19 prescreening questions. I asked the pt to wear a mask to his appointment. I told the pt we are trying to reduce the number of people coming into the office and if he can physically come into his appointment to please come alone. I told him if anything changes between now and his appointment time to call to let us know. The pt verbalized understanding.

## 2018-09-01 NOTE — Progress Notes (Signed)
ICD check in clinic. Normal device function. Thresholds and sensing consistent with previous device measurements. Impedance trends stable over time. No evidence of any ventricular arrhythmias. No mode switches. Histogram distribution appropriate for patient and level of activity. No changes made this session. Device programmed at appropriate safety margins. Device programmed to optimize intrinsic conduction. Estimated longevity 9.5 yrs. Pt enrolled in remote follow-up. Plan to check device every 3 months remotely and in office annually. Patient education completed including shock plan.

## 2018-09-02 ENCOUNTER — Encounter: Payer: BC Managed Care – PPO | Admitting: Physician Assistant

## 2018-09-17 ENCOUNTER — Telehealth: Payer: Self-pay | Admitting: Internal Medicine

## 2018-09-17 NOTE — Telephone Encounter (Signed)
Pt called to report his BP has been elevated... today 142/105, 143/104..3 days ago 148/105, 103/106.Marland Kitchen  He checks his BP as soon as he gets up in the morning and not again the rest of the day but later in the day he has episodes of dizziness and feeling "swimmy" headed.   He is unsure of his HR.   Pt denies chest pain, headache, sob and no edema. I have instructed him to check his BP mid day today and tonight and again in the morning. He will sit and rest for about 20 minutes and be sure both feet are on the ground when checking his BP.   Pt to see Daune Perch NP....09/18/18. he will monitor for headache, one sided weakness, slurred speech.Marland Kitchen and if he develops any other worsening SX his wife will call EMS.   Pt will also avoid caffeine and no added salt in his diet.       COVID-19 Pre-Screening Questions:  . In the past 7 to 10 days have you had a cough,  shortness of breath, headache, congestion, fever (100 or greater) body aches, chills, sore throat, or sudden loss of taste or sense of smell? NO . Have you been around anyone with known Covid 19.NO . Have you been around anyone who is awaiting Covid 19 test results in the past 7 to 10 days?NO . Have you been around anyone who has been exposed to Covid 19, or has mentioned symptoms of Covid 19 within the past 7 to 10 days?NO  If you have any concerns/questions about symptoms patients report during screening (either on the phone or at threshold). Contact the provider seeing the patient or DOD for further guidance.  If neither are available contact a member of the leadership team.

## 2018-09-17 NOTE — Telephone Encounter (Signed)
Pt c/o BP issue: STAT if pt c/o blurred vision, one-sided weakness or slurred speech  1. What are your last 5 BP readings? right arm  today today  Is 142/105 and left arm is 143/104  3 days ago right arm  Was 148/105 and left arm was 103/106  2. Are you having any other symptoms (ex. Dizziness, headache, blurred vision, passed out)? dizziness  3. What is your BP issue? Blood pressure is high

## 2018-09-18 ENCOUNTER — Encounter: Payer: Self-pay | Admitting: Cardiology

## 2018-09-18 ENCOUNTER — Other Ambulatory Visit: Payer: Self-pay

## 2018-09-18 ENCOUNTER — Ambulatory Visit (INDEPENDENT_AMBULATORY_CARE_PROVIDER_SITE_OTHER): Payer: BC Managed Care – PPO | Admitting: Cardiology

## 2018-09-18 VITALS — BP 146/102 | HR 61 | Ht 69.0 in | Wt 152.8 lb

## 2018-09-18 DIAGNOSIS — I480 Paroxysmal atrial fibrillation: Secondary | ICD-10-CM

## 2018-09-18 DIAGNOSIS — Z8674 Personal history of sudden cardiac arrest: Secondary | ICD-10-CM

## 2018-09-18 DIAGNOSIS — I2699 Other pulmonary embolism without acute cor pulmonale: Secondary | ICD-10-CM

## 2018-09-18 DIAGNOSIS — Z79899 Other long term (current) drug therapy: Secondary | ICD-10-CM

## 2018-09-18 DIAGNOSIS — Z95810 Presence of automatic (implantable) cardiac defibrillator: Secondary | ICD-10-CM | POA: Diagnosis not present

## 2018-09-18 DIAGNOSIS — Z952 Presence of prosthetic heart valve: Secondary | ICD-10-CM | POA: Diagnosis not present

## 2018-09-18 DIAGNOSIS — I10 Essential (primary) hypertension: Secondary | ICD-10-CM | POA: Diagnosis not present

## 2018-09-18 DIAGNOSIS — I5022 Chronic systolic (congestive) heart failure: Secondary | ICD-10-CM

## 2018-09-18 MED ORDER — LOSARTAN POTASSIUM 25 MG PO TABS: 25.0000 mg | ORAL_TABLET | Freq: Every day | ORAL | 3 refills | Status: DC

## 2018-09-18 NOTE — Patient Instructions (Addendum)
Medication Instructions:  START: Losartan 25 mg once a day   If you need a refill on your cardiac medications before your next appointment, please call your pharmacy.   Lab work: FUTURE: BMET-to be done same day as echo   If you have labs (blood work) drawn today and your tests are completely normal, you will receive your results only by: Marland Kitchen MyChart Message (if you have MyChart) OR . A paper copy in the mail If you have any lab test that is abnormal or we need to change your treatment, we will call you to review the results.  Testing/Procedures: Your physician has requested that you have an echocardiogram in 2 weeks. Echocardiography is a painless test that uses sound waves to create images of your heart. It provides your doctor with information about the size and shape of your heart and how well your heart's chambers and valves are working. This procedure takes approximately one hour. There are no restrictions for this procedure.    Follow-Up: You are scheduled to see Dr. Lovena Le on 10/27/2018 @ 10:30 AM  Please schedule follow up appointment with Dr. Burt Knack or care team in 3 months   Any Other Special Instructions Will Be Listed Below (If Applicable).   Lifestyle Modifications to Prevent and Treat Heart Disease -Recommend heart healthy/Mediterranean diet, with whole grains, fruits, vegetables, fish, lean meats, nuts, olive oil and avocado oil.  -Limit salt intake to less than 2000 mg per day.  -Recommend moderate walking, starting slowly with a few minutes and working up to 3-5 times/week for 30-50 minutes each session. Aim for at least 150 minutes.week. Goal should be pace of 3 miles/hours, or walking 1.5 miles in 30 minutes -Recommend avoidance of tobacco products. Avoid excess alcohol. -Keep blood pressure well controlled, ideally less than 130/80.

## 2018-09-18 NOTE — Progress Notes (Signed)
Cardiology Office Note:    Date:  09/18/2018   ID:  Kevin Rojas, DOB 10-10-1955, MRN [id number]  PCP:  Merri BrunetteSmith, Candace, MD  Cardiologist:  Tonny BollmanMichael Cooper, MD  Electrophysiologist: Lewayne BuntingGregg Taylor, MD  Referring MD: Merri BrunetteSmith, Candace, MD   Chief Complaint  Patient presents with  . Hypertension  . Follow-up    S/P AVR and ICD    History of Present Illness:    Kevin Rojas is a 63 y.o. male with a past medical history significant for bicuspid aortic valve with severe aortic insufficiency s/p bioprosthetic aortic valve replacement 06/08/2018, nonischemic cardiomyopathy, left bundle branch block, dilated Ao root (4.2 cm on CT in 06/2018) and ascending Aorta (4.3 cm).    He was admitted in March 2020 with out of hospital VF arrest.  He was treated with bystander CPR and had a prolonged hospitalization.  During this hospitalization, he was noted to have severe aortic insufficiency with bicuspid aortic valve and mild LV dysfunction.  Cardiac catheterization demonstrated normal coronary arteries, EF 45-50% .  He was discharged on a LifeVest.  Work-up for aortic valve surgery included a chest CT which demonstrated an incidental finding of a R subsegmental nonocclusive pulmonary embolism.  He was tx with Lovenox >> Xarelto.  He recovered from his anoxic encephalopathy and ultimately underwent bioprosthetic aortic valve replacement with Dr. Tyrone SageGerhardt 06/08/2018 (DC on 06/13/2018).  He had intraoperative atrial fibrillation which was managed with Amiodarone.  He was DC on a LifeVest.     On 07/17/2018 he underwent CRT-D implant by Dr. Ladona Ridgelaylor.  He had some minor issues with his wound and had a hematoma.  Xarelto and aspirin were stopped.  At his last EP visit it was noted that if his wound continues to improve aspirin would be resumed.  Kevin Rojas is here to address blood pressure concerns.  He was told that he needs good blood pressure control and he has noted frequent blood pressures in the 140s over low 100s.  Home BP's over the last 5 days 120s-140's/88-105.  HR 50's-60's  About once a week he gets "swimmy headed", usually when he first stands up. He thinks may be related to his glasses that he uses for reading.   He walks everyday, at least 20 minutes, with no Chest discomfort or shortness of breath. No orthopnea, PND or edema. He gets twitching in his left side when he lays on his right side but not if he changes positions. Most of the time he has no lightheadedness and has had no syncope.    Past Medical History:  Diagnosis Date  . Aortic insufficiency   . Cardiac arrest (HCC) 03/24/2018  . Dysrhythmia   . History of kidney stones   . Pneumonia    history of  . Pulmonary embolism (HCC)    04/04/18  . S/P AVR (aortic valve replacement)    Echocardiogram 07/03/2018: EF 45-50, septal-lateral dyssynchrony, mild LVH, grade 2 diastolic dysfunction, normal RVSF, mild Kevin, bioprosthetic AoV with no regurgitation and mildly elevated mean gradient at 18, mild dilation of the ascending aorta (42), PASP 21  . Thoracic aortic aneurysm (HCC) 06/24/2018   CT 03/2018:  Ao root 4.2 cm; ascending Aorta 4.3 cm    Past Surgical History:  Procedure Laterality Date  . AORTIC ARCH ANGIOGRAPHY N/A 04/02/2018   Procedure: AORTIC ARCH ANGIOGRAPHY;  Surgeon: Lennette BihariKelly, Thomas A, MD;  [facility];  Service: Cardiovascular;  Laterality: N/A;  . AORTIC VALVE REPLACEMENT N/A 06/08/2018  Procedure: AORTIC VALVE REPLACEMENT (AVR), ON PUMP, USING MAGNA EASE AORTIC BIOPROSTHESIS VALVE 25MM;  Surgeon: Grace Isaac, MD;  Location: [place];  Service: Open Heart Surgery;  Laterality: N/A;  . BIV ICD INSERTION CRT-D N/A 07/17/2018   Procedure: BIV ICD INSERTION CRT-D;  Surgeon: Evans Lance, MD;  Location: [facility];  Service: Cardiovascular;  Laterality: N/A;  . PACEMAKER INSERTION    . RIGHT/LEFT HEART CATH AND CORONARY ANGIOGRAPHY N/A 04/02/2018   Procedure: RIGHT/LEFT HEART CATH AND CORONARY  ANGIOGRAPHY;  Surgeon: Troy Sine, MD;  Location: [facility];  Service: Cardiovascular;  Laterality: N/A;  . TEE WITHOUT CARDIOVERSION N/A 06/08/2018   Procedure: TRANSESOPHAGEAL ECHOCARDIOGRAM (TEE);  Surgeon: Grace Isaac, MD;  Location: [facility];  Service: Open Heart Surgery;  Laterality: N/A;    Current Medications: Current Meds  Medication Sig  . acetaminophen (TYLENOL) 325 MG tablet Take 1-2 tablets (325-650 mg total) by mouth every 4 (four) hours as needed for mild pain.  Marland Kitchen amiodarone (PACERONE) 200 MG tablet Take 1 tablet (200 mg total) by mouth daily.  . metoprolol tartrate (LOPRESSOR) 25 MG tablet Take 0.5 tablets (12.5 mg total) by mouth 2 (two) times daily.     Allergies:   Patient has no known allergies.   Social History   Socioeconomic History  . Marital status: Married    Spouse name: Not on file  . Number of children: Not on file  . Years of education: Not on file  . Highest education level: Not on file  Occupational History  . Not on file  Social Needs  . Financial resource strain: Not on file  . Food insecurity    Worry: Not on file    Inability: Not on file  . Transportation needs    Medical: Not on file    Non-medical: Not on file  Tobacco Use  . Smoking status: Never Smoker  . Smokeless tobacco: Never Used  Substance and Sexual Activity  . Alcohol use: Never    Frequency: Never  . Drug use: Never  . Sexual activity: Not on file  Lifestyle  . Physical activity    Days per week: Not on file    Minutes per session: Not on file  . Stress: Not on file  Relationships  . Social Herbalist on phone: Not on file    Gets together: Not on file    Attends religious service: Not on file    Active member of club or organization: Not on file    Attends meetings of clubs or organizations: Not on file    Relationship status: Not on file  Other Topics Concern  . Not on file  Social History Narrative  . Not on file    Family  History: The patient's family history includes Prostate cancer in his father; Pulmonary embolism in his mother. ROS:   Please see the history of present illness.     All other systems reviewed and are negative.  EKGs/Labs/Other Studies Reviewed:    The following studies were reviewed today:  07/02/2018: TTE IMPRESSIONS 1. The left ventricle has mildly reduced systolic function, with an ejection fraction of 45-50%. Marked septal-lateral dyssynchrony. The cavity size was normal. There is mildly increased left ventricular wall thickness. Grade II diastolic dysfunction. 2. The right ventricle has normal systolic function. The cavity was normal. There is no increase in right ventricular wall thickness. 3. No evidence of mitral valve stenosis. Mild mitral  regurgitation. 4. Bioprosthetic aortic valve. No significant regurgitation. Mildly elevated mean gradient at 18 mmHg. 5. There is mild dilatation of the ascending aorta measuring 42 mm. 6. Normal IVC size. PA systolic pressure 21 mmHg.  FINDINGS Left Ventricle: The left ventricle has mildly reduced systolic function, with an ejection fraction of 45-50%. The cavity size was normal. There is mildly increased left ventricular wall thickness. Left ventricular diastolic Doppler parameters are  consistent with pseudonormalization.  Right Ventricle: The right ventricle has normal systolic function. The cavity was normal. There is no increase in right ventricular wall thickness.  Left Atrium: Left atrial size was normal in size.  Right Atrium: Right atrial size was normal in size.  Interatrial Septum: No atrial level shunt detected by color flow Doppler.  Pericardium: There is no evidence of pericardial effusion.  Mitral Valve: The mitral valve is normal in structure. Mitral valve regurgitation is mild by color flow Doppler. No evidence of mitral valve stenosis.  Tricuspid Valve: The tricuspid valve is normal in structure.  Tricuspid valve regurgitation is trivial by color flow Doppler.  Aortic Valve: The aortic valve has been repaired/replaced Aortic valve regurgitation was not visualized by color flow Doppler. Bioprosthetic aortic valve.  Pulmonic Valve: The pulmonic valve was normal in structure. Pulmonic valve regurgitation is trivial by color flow Doppler.  Aorta: There is mild dilatation of the ascending aorta measuring 42 mm.  Venous: The inferior vena cava is normal in size with greater than 50% respiratory variability.  04/02/2018: R/LHC  There is mild left ventricular systolic dysfunction.  LV end diastolic pressure is normal.  The left ventricular ejection fraction is 45-50% by visual estimate.  There is severe (4+) aortic regurgitation. Normal right heart pressures. Mild global LV dysfunction with an EF 45 to 50%. Bicuspid aortic valve with severe 4+ aortic insufficiency, mild aortic stenosis. Dilated aortic root. Normal coronary arteries and a left dominant circulation.    EKG:  EKG not indicated  Recent Labs: 06/03/2018: ALT 16 06/12/2018: Magnesium 2.2; TSH 0.920 06/30/2018: BUN 12; Creatinine, Ser 1.38; Hemoglobin 11.4; Platelets 471; Potassium 4.9; Sodium 138   Recent Lipid Panel No results found for: CHOL, TRIG, HDL, CHOLHDL, VLDL, LDLCALC, LDLDIRECT  Physical Exam:    VS:  BP (!) 146/102   Pulse 61   Ht 5\' 9"  (1.753 m)   Wt 152 lb 12.8 oz (69.3 kg)   SpO2 97%   BMI 22.56 kg/m     Wt Readings from Last 3 Encounters:  09/18/18 152 lb 12.8 oz (69.3 kg)  08/14/18 149 lb (67.6 kg)  07/19/18 145 lb 3.2 oz (65.9 kg)     Physical Exam  Constitutional: He is oriented to person, place, and time. He appears well-developed and well-nourished. No distress.  HENT:  Head: Normocephalic and atraumatic.  Neck: Normal range of motion. Neck supple. No JVD present.  Cardiovascular: Normal rate, regular rhythm, normal heart sounds and intact distal pulses. Exam reveals no gallop  and no friction rub.  No murmur heard. Pulmonary/Chest: Effort normal and breath sounds normal. No respiratory distress. He has no wheezes. He has no rales.  Abdominal: Soft. Bowel sounds are normal.  Musculoskeletal: Normal range of motion.        General: No edema.  Neurological: He is alert and oriented to person, place, and time.  Skin: Skin is warm and dry.  Psychiatric: He has a normal mood and affect. His behavior is normal. Judgment and thought content normal.  Vitals reviewed.   ASSESSMENT:  1. S/P AVR (aortic valve replacement)   2. History of cardiac arrest   3. Essential (primary) hypertension   4. Implantable cardioverter-defibrillator (ICD) in situ   5. Paroxysmal atrial fibrillation (HCC)   6. Other acute pulmonary embolism without acute cor pulmonale (HCC)   7. Chronic systolic heart failure (HCC)   8. Medication management    PLAN:    In order of problems listed above:  S/P aortic valve replacement with pericardial tissue valve 06/08/2018 -The patient is recovering very well from his surgery. -Patient has follow-up surgical appointment on 10/22/2018 -Aspirin is currently on hold due to pacemaker site hematoma. -Continue SBE prophylaxis. -Patient will need follow-up echocardiogram, I will arrange for this.  Status post cardiac arrest in setting of severe aortic insufficiency -LBBB noted.  Normal coronary arteries by cath. -Patient now has CRT-D, 07/17/2018, by Dr. Ladona Ridgel. -Patient had a pacemaker site hematoma.  His blood thinner is on hold as well as aspirin. 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  He will need to check with Dr. Ladona Ridgel for driving restrictions.  He is currently not working. -Patient has pacemaker follow-up scheduled for 10/27/2018, will need to review resumption of aspirin and whether to discontinue amiodarone. -Pacemaker site is healing well.  No further evidence of hematoma.  Essential hypertension -It has been noted  that patient was having low blood pressures surrounding his cardiac event. -Currently he is running elevated blood pressures, 120s-140's/88-105.  -With mildly reduced LV systolic function, I will add low-dose ARB, losartan 25 mg daily, and can titrate up as indicated.  Paroxysmal atrial fibrillation -This occurred intraoperatively and was managed with amiodarone.  He had evidence of accelerated junctional rhythm as well.  Metoprolol was decreased to 12.5 mg every evening at telemedicine visit on 06/24/2018 due to complaints of dizziness. -Anticipate discontinuing amiodarone.  I will leave this decision to Dr. Ladona Ridgel at EP follow-up.  History of pulmonary embolism -Found on 04/04/2018- acute subsegmental non-occlusive PE, overall low burden of thrombus.  -Patient was previously on Xarelto which has now been stopped due to pacemaker site hematoma.  Nonischemic cardiomyopathy -EF 45-50% by echo in March 2020.  Will recheck echocardiogram since his aortic valve replacement. -Blood pressure had been too low to start ARB/ACE-I, blood pressures now slightly elevated.  Initiating low-dose ARB. -We will recheck echocardiogram in a couple of weeks after initiating ARB.    Medication Adjustments/Labs and Tests Ordered: Current medicines are reviewed at length with the patient today.  Concerns regarding medicines are outlined above. Labs and tests ordered and medication changes are outlined in the patient instructions below:  Patient Instructions  Medication Instructions:  START: Losartan 25 mg once a day   If you need a refill on your cardiac medications before your next appointment, please call your pharmacy.   Lab work: FUTURE: BMET-to be done same day as echo   If you have labs (blood work) drawn today and your tests are completely normal, you will receive your results only by: Marland Kitchen MyChart Message (if you have MyChart) OR . A paper copy in the mail If you have any lab test that is abnormal or we  need to change your treatment, we will call you to review the results.  Testing/Procedures: Your physician has requested that you have an echocardiogram in 2 weeks. Echocardiography is a painless test that uses sound waves to create images of your heart. It provides your doctor with information about the size and shape of your  heart and how well your heart's chambers and valves are working. This procedure takes approximately one hour. There are no restrictions for this procedure.    Follow-Up: You are scheduled to see Dr. Ladona Ridgel on 10/27/2018 @ 10:30 AM  Please schedule follow up appointment with Dr. Excell Seltzer or care team in 3 months   Any Other Special Instructions Will Be Listed Below (If Applicable).   Lifestyle Modifications to Prevent and Treat Heart Disease -Recommend heart healthy/Mediterranean diet, with whole grains, fruits, vegetables, fish, lean meats, nuts, olive oil and avocado oil.  -Limit salt intake to less than 2000 mg per day.  -Recommend moderate walking, starting slowly with a few minutes and working up to 3-5 times/week for 30-50 minutes each session. Aim for at least 150 minutes.week. Goal should be pace of 3 miles/hours, or walking 1.5 miles in 30 minutes -Recommend avoidance of tobacco products. Avoid excess alcohol. -Keep blood pressure well controlled, ideally less than 130/80.      Signed, Berton Bon, NP  09/18/2018 5:07 PM     [facility]

## 2018-09-25 ENCOUNTER — Other Ambulatory Visit: Payer: Self-pay | Admitting: Cardiology

## 2018-09-25 MED ORDER — ASPIRIN EC 81 MG PO TBEC: 81.0000 mg | DELAYED_RELEASE_TABLET | Freq: Every day | ORAL | 2 refills | Status: AC

## 2018-09-25 NOTE — Progress Notes (Signed)
Reinitiate aspirin therapy.

## 2018-09-29 ENCOUNTER — Ambulatory Visit: Payer: BC Managed Care – PPO | Admitting: Cardiology

## 2018-10-02 ENCOUNTER — Ambulatory Visit (HOSPITAL_COMMUNITY): Payer: BC Managed Care – PPO | Attending: Cardiology

## 2018-10-02 ENCOUNTER — Other Ambulatory Visit: Payer: Self-pay

## 2018-10-02 ENCOUNTER — Other Ambulatory Visit: Payer: BC Managed Care – PPO | Admitting: *Deleted

## 2018-10-02 DIAGNOSIS — I5022 Chronic systolic (congestive) heart failure: Secondary | ICD-10-CM | POA: Diagnosis not present

## 2018-10-02 DIAGNOSIS — Z952 Presence of prosthetic heart valve: Secondary | ICD-10-CM | POA: Diagnosis not present

## 2018-10-02 DIAGNOSIS — I10 Essential (primary) hypertension: Secondary | ICD-10-CM

## 2018-10-02 DIAGNOSIS — Z79899 Other long term (current) drug therapy: Secondary | ICD-10-CM

## 2018-10-02 LAB — BASIC METABOLIC PANEL
BUN/Creatinine Ratio: 9 — ABNORMAL LOW (ref 10–24)
BUN: 14 mg/dL (ref 8–27)
CO2: 25 mmol/L (ref 20–29)
Calcium: 9.7 mg/dL (ref 8.6–10.2)
Chloride: 102 mmol/L (ref 96–106)
Creatinine, Ser: 1.54 mg/dL — ABNORMAL HIGH (ref 0.76–1.27)
GFR calc Af Amer: 55 mL/min/{1.73_m2} — ABNORMAL LOW (ref >59)
GFR calc non Af Amer: 48 mL/min/{1.73_m2} — ABNORMAL LOW (ref >59)
Glucose: 102 mg/dL — ABNORMAL HIGH (ref 65–99)
Potassium: 4.6 mmol/L (ref 3.5–5.2)
Sodium: 141 mmol/L (ref 134–144)

## 2018-10-05 ENCOUNTER — Telehealth: Payer: Self-pay

## 2018-10-05 DIAGNOSIS — I10 Essential (primary) hypertension: Secondary | ICD-10-CM

## 2018-10-05 NOTE — Telephone Encounter (Signed)
-----   Message from Daune Perch, NP sent at 10/05/2018  7:45 AM EDT ----- Kidney function changed very slightly with addition of losartan, but not significantly enough to change therapy. Potassium is good. Follow up BMet in a month.   Daune Perch, NP

## 2018-10-05 NOTE — Telephone Encounter (Signed)
Pt aware of results. Pt verbalized understanding. Pt is scheduled to see Dr. Lovena Le on 10/20 and will have Bmet checked the same day.

## 2018-10-06 ENCOUNTER — Encounter: Payer: Self-pay | Admitting: *Deleted

## 2018-10-07 ENCOUNTER — Other Ambulatory Visit: Payer: Self-pay | Admitting: Internal Medicine

## 2018-10-12 ENCOUNTER — Telehealth: Payer: Self-pay | Admitting: Internal Medicine

## 2018-10-12 NOTE — Telephone Encounter (Signed)
Patient states that the disability office hasn't received records. 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

## 2018-10-13 NOTE — Telephone Encounter (Signed)
Patient would like to be called instead of message to be sent via mychart.

## 2018-10-14 ENCOUNTER — Encounter: Payer: BC Managed Care – PPO | Admitting: Internal Medicine

## 2018-10-14 NOTE — Telephone Encounter (Signed)
Left detailed message per DPR.  Reiterated what had already been sent and read on MyChart.  Advised this nurse has nothing to do with disability paperwork.  Pt needs to call [employer] to see what the disability situation is.  Advised Per DR. 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  Per Dr. Lovena Le he has already discussed this with the Pt.  Advised to call if any further needs.

## 2018-10-16 ENCOUNTER — Telehealth: Payer: Self-pay | Admitting: Internal Medicine

## 2018-10-16 MED ORDER — LOSARTAN POTASSIUM 25 MG PO TABS: 50.0000 mg | ORAL_TABLET | Freq: Every day | ORAL | 3 refills | Status: DC

## 2018-10-16 MED ORDER — METOPROLOL TARTRATE 25 MG PO TABS: 25.0000 mg | ORAL_TABLET | Freq: Two times a day (BID) | ORAL | 3 refills | Status: DC

## 2018-10-16 NOTE — Telephone Encounter (Signed)
Would increase losartan to 50 mg daily and metoprolol to 25 mg twice daily. Continue to monitor BP and send readings back in a few weeks. thanks

## 2018-10-16 NOTE — Telephone Encounter (Signed)
I spoke to the patient with Dr Antionette Char medication recommendation.  Increase Losartan to 50 mg Daily and Increase Metoprolol to 25 mg bid.  Patient verbalized understanding.

## 2018-10-16 NOTE — Telephone Encounter (Signed)
Pt c/o BP issue: STAT if pt c/o blurred vision, one-sided weakness or slurred speech  1. What are your last 5 BP readings?  10-11-18 until today -151/106, 10-13-18--141/105- 10-14-18- 123/97 in the evening- 10-15-18- 158/106 today - 152/105   2. Are you having any other symptoms (ex. Dizziness, headache, blurred vision, passed out)? no  3. What is your BP issue? Blood pressure running high

## 2018-10-16 NOTE — Telephone Encounter (Signed)
Pt calling in concerning his BP readings being high lately. He reports no diet, exercise or lifestyle changes. Pt has been compliant with all meds.   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 Lovena Le regarding his condition.

## 2018-10-22 ENCOUNTER — Encounter: Payer: Self-pay | Admitting: Cardiothoracic Surgery

## 2018-10-22 ENCOUNTER — Other Ambulatory Visit: Payer: Self-pay

## 2018-10-22 ENCOUNTER — Ambulatory Visit (INDEPENDENT_AMBULATORY_CARE_PROVIDER_SITE_OTHER): Payer: BC Managed Care – PPO | Admitting: Cardiothoracic Surgery

## 2018-10-22 VITALS — BP 139/90 | HR 60 | Temp 97.7°F | Resp 20 | Ht 69.0 in | Wt 157.0 lb

## 2018-10-22 DIAGNOSIS — Q231 Congenital insufficiency of aortic valve: Secondary | ICD-10-CM

## 2018-10-22 DIAGNOSIS — I351 Nonrheumatic aortic (valve) insufficiency: Secondary | ICD-10-CM | POA: Diagnosis not present

## 2018-10-22 DIAGNOSIS — Z952 Presence of prosthetic heart valve: Secondary | ICD-10-CM

## 2018-10-22 NOTE — Progress Notes (Signed)
[address]       [place] [address]             [phone]      CORDAY WYKA [facility] Medical Record #[id number] Date of Birth: 02-01-1955  Referring: [name], MD Primary Care: [name], MD Primary Cardiologist: Tonny Bollman, MD   Chief Complaint:   POST OP FOLLOW UP OPERATIVE REPORT DATE OF PROCEDURE:  06/08/2018 PREOPERATIVE DIAGNOSES:  Severe aortic insufficiency with history of ventricular fibrillation arrest at home. POSTOPERATIVE DIAGNOSES:  Severe aortic insufficiency with history of ventricular fibrillation arrest at home. SURGICAL PROCEDURE:  Aortic valve replacement with a pericardial tissue valve, [employer], model 3300 TFX, 25 mm, serial [id number]  History of Present Illness:     Patient returns after recent aortic valve replacement for severe aortic insufficiency.  He originally presented with out-of-hospital cardiac arrest.   The patient is making good progress following his surgical replacement of his aortic valve for severe aortic insufficiency.  Since last seen the patient has had a AICD placed.  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 Aortic insufficiency   . Cardiac arrest (HCC) 03/24/2018  . Dysrhythmia   . History of kidney stones   . Pneumonia    history of  . Pulmonary embolism (HCC)    04/04/18  . S/P AVR (aortic valve replacement)    Echocardiogram 07/03/2018: EF 45-50, septal-lateral dyssynchrony, mild LVH, grade 2 diastolic dysfunction, normal RVSF, mild MR, bioprosthetic AoV with no regurgitation and mildly elevated mean gradient at 18, mild dilation of the ascending aorta (42), PASP 21  . Thoracic aortic aneurysm (HCC) 06/24/2018   CT 03/2018:  Ao root 4.2 cm; ascending Aorta 4.3 cm     Social History   Tobacco Use  Smoking Status Never Smoker   Smokeless Tobacco Never Used    Social History   Substance and Sexual Activity  Alcohol Use Never  . Frequency: Never     No Known Allergies  Current Outpatient Medications  Medication Sig Dispense Refill  . acetaminophen (TYLENOL) 325 MG tablet Take 1-2 tablets (325-650 mg total) by mouth every 4 (four) hours as needed for mild pain.    Marland Kitchen amiodarone (PACERONE) 200 MG tablet TAKE 1 TABLET(200 MG) BY MOUTH DAILY 90 tablet 0  . aspirin EC 81 MG tablet Take 1 tablet (81 mg total) by mouth daily. 150 tablet 2  . losartan (COZAAR) 25 MG tablet Take 2 tablets (50 mg total) by mouth daily. 90 tablet 3  . metoprolol tartrate (LOPRESSOR) 25 MG tablet Take 1 tablet (25 mg total) by mouth 2 (two) times daily. 180 tablet 3   No current facility-administered medications for this visit.        Physical Exam: BP 139/90   Pulse 60   Temp 97.7 F (36.5 C) (Skin)   Resp 20   Ht 5\' 9"  (1.753 m)   Wt 157 lb (71.2 kg)   SpO2 96% Comment: RA  BMI 23.18 kg/m   General appearance: alert, cooperative and no distress Head: Normocephalic, without obvious abnormality, atraumatic Lymph nodes: Cervical, supraclavicular, and axillary nodes normal. Cardio: regular rate and rhythm, S1, S2 normal, no murmur, click, rub or gallop Extremities: extremities normal, atraumatic, no cyanosis or edema Neurologic: Grossly normal   Diagnostic Studies & Laboratory  data:     Recent Radiology Findings:    Recent Lab Findings: Lab Results  Component Value Date   WBC 5.8 06/30/2018   HGB 11.4 (L) 06/30/2018   HCT 36.4 (L) 06/30/2018   PLT 471 (H) 06/30/2018   GLUCOSE 102 (H) 10/02/2018   ALT 16 06/03/2018   AST 15 06/03/2018   NA 141 10/02/2018   K 4.6 10/02/2018   CL 102 10/02/2018   CREATININE 1.54 (H) 10/02/2018   BUN 14 10/02/2018   CO2 25 10/02/2018   TSH 0.920 06/12/2018   INR 1.9 (H) 06/08/2018   HGBA1C 5.0 06/03/2018      Assessment / Plan:   #1 stable after aortic valve replacement  with pericardial tissue valve #2 original presentation with out-of-hospital V. 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 I again reviewed reviewed with him the need for endocarditis prophylaxis with invasive procedures Plan to see him back in 6 months      Medication Changes: No orders of the defined types were placed in this encounter.     Grace Isaac MD      [address] [place] [address] Office [phone]   Circle  10/22/2018 2:12 PM

## 2018-10-22 NOTE — Patient Instructions (Signed)

## 2018-10-23 DIAGNOSIS — H5203 Hypermetropia, bilateral: Secondary | ICD-10-CM | POA: Diagnosis not present

## 2018-10-27 ENCOUNTER — Other Ambulatory Visit: Payer: BC Managed Care – PPO

## 2018-10-27 ENCOUNTER — Ambulatory Visit (INDEPENDENT_AMBULATORY_CARE_PROVIDER_SITE_OTHER): Payer: BC Managed Care – PPO | Admitting: Internal Medicine

## 2018-10-27 ENCOUNTER — Other Ambulatory Visit: Payer: Self-pay

## 2018-10-27 ENCOUNTER — Encounter: Payer: Self-pay | Admitting: Internal Medicine

## 2018-10-27 VITALS — BP 138/86 | HR 60 | Ht 69.0 in | Wt 156.0 lb

## 2018-10-27 DIAGNOSIS — Z23 Encounter for immunization: Secondary | ICD-10-CM

## 2018-10-27 DIAGNOSIS — I10 Essential (primary) hypertension: Secondary | ICD-10-CM

## 2018-10-27 DIAGNOSIS — I4901 Ventricular fibrillation: Secondary | ICD-10-CM | POA: Diagnosis not present

## 2018-10-27 DIAGNOSIS — Z95810 Presence of automatic (implantable) cardiac defibrillator: Secondary | ICD-10-CM | POA: Diagnosis not present

## 2018-10-27 DIAGNOSIS — I447 Left bundle-branch block, unspecified: Secondary | ICD-10-CM | POA: Diagnosis not present

## 2018-10-27 DIAGNOSIS — Z736 Limitation of activities due to disability: Secondary | ICD-10-CM

## 2018-10-27 LAB — BASIC METABOLIC PANEL
BUN/Creatinine Ratio: 9 — ABNORMAL LOW (ref 10–24)
BUN: 14 mg/dL (ref 8–27)
CO2: 24 mmol/L (ref 20–29)
Calcium: 9.5 mg/dL (ref 8.6–10.2)
Chloride: 104 mmol/L (ref 96–106)
Creatinine, Ser: 1.51 mg/dL — ABNORMAL HIGH (ref 0.76–1.27)
GFR calc Af Amer: 56 mL/min/{1.73_m2} — ABNORMAL LOW (ref >59)
GFR calc non Af Amer: 49 mL/min/{1.73_m2} — ABNORMAL LOW (ref >59)
Glucose: 101 mg/dL — ABNORMAL HIGH (ref 65–99)
Potassium: 4.6 mmol/L (ref 3.5–5.2)
Sodium: 141 mmol/L (ref 134–144)

## 2018-10-27 NOTE — Patient Instructions (Signed)
Medication Instructions:  Your physician recommends that you continue on your current medications as directed. Please refer to the Current Medication list given to you today.  Labwork: None ordered.  Testing/Procedures: None ordered.  Follow-Up: Your physician wants you to follow-up in: 9 months with Dr. Lovena Le.   You will receive a reminder letter in the mail two months in advance. If you don't receive a letter, please call our office to schedule the follow-up appointment.  Remote monitoring is used to monitor your ICD from home. This monitoring reduces the number of office visits required to check your device to one time per year. It allows [place] to keep an eye on the functioning of your device to ensure it is working properly. You are scheduled for a device check from home on 11/18/2018. You may send your transmission at any time that day. If you have a wireless device, the transmission will be sent automatically. After your physician reviews your transmission, you will receive a postcard with your next transmission date.  Any Other Special Instructions Will Be Listed Below (If Applicable).  If you need a refill on your cardiac medications before your next appointment, please call your pharmacy.

## 2018-10-27 NOTE — Progress Notes (Signed)
HPI Kevin Rojas returns today for followup. He is a pleasant 63 yo man with a h/o VF arrest, AI, s/p aortic root replacement, s/p ICD insertion. He notes that his energy has improved. He has been exercising some but a little hesitant about how much he could actually do. No edema. No ICD therapies. No cough.  No Known Allergies   Current Outpatient Medications  Medication Sig Dispense Refill  . acetaminophen (TYLENOL) 325 MG tablet Take 1-2 tablets (325-650 mg total) by mouth every 4 (four) hours as needed for mild pain.    Marland Kitchen amiodarone (PACERONE) 200 MG tablet TAKE 1 TABLET(200 MG) BY MOUTH DAILY 90 tablet 0  . aspirin EC 81 MG tablet Take 1 tablet (81 mg total) by mouth daily. 150 tablet 2  . losartan (COZAAR) 25 MG tablet Take 2 tablets (50 mg total) by mouth daily. 90 tablet 3  . metoprolol tartrate (LOPRESSOR) 25 MG tablet Take 1 tablet (25 mg total) by mouth 2 (two) times daily. 180 tablet 3   No current facility-administered medications for this visit.      Past Medical History:  Diagnosis Date  . Aortic insufficiency   . Cardiac arrest ([place]) 03/24/2018  . Dysrhythmia   . History of kidney stones   . Pneumonia    history of  . Pulmonary embolism ([facility])    04/04/18  . S/P AVR (aortic valve replacement)    Echocardiogram 07/03/2018: EF 45-50, septal-lateral dyssynchrony, mild LVH, grade 2 diastolic dysfunction, normal RVSF, mild MR, bioprosthetic AoV with no regurgitation and mildly elevated mean gradient at 18, mild dilation of the ascending aorta (42), PASP 21  . Thoracic aortic aneurysm ([place]) 06/24/2018   CT 03/2018:  Ao root 4.2 cm; ascending Aorta 4.3 cm    ROS:   All systems reviewed and negative except as noted in the HPI.   Past Surgical History:  Procedure Laterality Date  . AORTIC ARCH ANGIOGRAPHY N/A 04/02/2018   Procedure: AORTIC ARCH ANGIOGRAPHY;  Surgeon: Troy Sine, MD;  Location: [facility];  Service: Cardiovascular;  Laterality: N/A;  .  AORTIC VALVE REPLACEMENT N/A 06/08/2018   Procedure: AORTIC VALVE REPLACEMENT (AVR), ON PUMP, USING MAGNA EASE AORTIC BIOPROSTHESIS VALVE 25MM;  Surgeon: Grace Isaac, MD;  Location: [facility];  Service: Open Heart Surgery;  Laterality: N/A;  . BIV ICD INSERTION CRT-D N/A 07/17/2018   Procedure: BIV ICD INSERTION CRT-D;  Surgeon: Evans Lance, MD;  Location: [facility];  Service: Cardiovascular;  Laterality: N/A;  . PACEMAKER INSERTION    . RIGHT/LEFT HEART CATH AND CORONARY ANGIOGRAPHY N/A 04/02/2018   Procedure: RIGHT/LEFT HEART CATH AND CORONARY ANGIOGRAPHY;  Surgeon: Troy Sine, MD;  Location: [facility];  Service: Cardiovascular;  Laterality: N/A;  . TEE WITHOUT CARDIOVERSION N/A 06/08/2018   Procedure: TRANSESOPHAGEAL ECHOCARDIOGRAM (TEE);  Surgeon: Grace Isaac, MD;  Location: [facility];  Service: Open Heart Surgery;  Laterality: N/A;     Family History  Problem Relation Age of Onset  . Pulmonary embolism Mother   . Prostate cancer Father        passed awary at 43     Social History   Socioeconomic History  . Marital status: Married    Spouse name: Not on file  . Number of children: Not on file  . Years of education: Not on file  . Highest education level: Not on file  Occupational History  . Not on file  Social Needs  . Financial resource strain: Not on file  . Food insecurity    Worry: Not on file    Inability: Not on file  . Transportation needs    Medical: Not on file    Non-medical: Not on file  Tobacco Use  . Smoking status: Never Smoker  . Smokeless tobacco: Never Used  Substance and Sexual Activity  . Alcohol use: Never    Frequency: Never  . Drug use: Never  . Sexual activity: Not on file  Lifestyle  . Physical activity    Days per week: Not on file    Minutes per session: Not on file  . Stress: Not on file  Relationships  . Social Musician on phone: Not on file    Gets together: Not on file    Attends religious  service: Not on file    Active member of club or organization: Not on file    Attends meetings of clubs or organizations: Not on file    Relationship status: Not on file  . Intimate partner violence    Fear of current or ex partner: Not on file    Emotionally abused: Not on file    Physically abused: Not on file    Forced sexual activity: Not on file  Other Topics Concern  . Not on file  Social History Narrative  . Not on file     BP 138/86   Pulse 60   Ht 5\' 9"  (1.753 m)   Wt 156 lb (70.8 kg)   SpO2 98%   BMI 23.04 kg/m   Physical Exam:  Well appearing NAD HEENT: Unremarkable Neck:  No JVD, no thyromegally Lymphatics:  No adenopathy Back:  No CVA tenderness Lungs:  Clear with no wheezes HEART:  Regular rate rhythm, no murmurs, no rubs, no clicks Abd:  soft, positive bowel sounds, no organomegally, no rebound, no guarding Ext:  2 plus pulses, no edema, no cyanosis, no clubbing Skin:  No rashes no nodules Neuro:  CN II through XII intact, motor grossly intact  EKG -nsr with biv pacing  DEVICE  Normal device function.  See PaceArt for details.   Assess/Plan: 1. VF arrest - he has had no recurrent episodes of VF. He will continue amiodarone. I would anticipate reducing his dose of amio when he returns for followup if his ventricular arrhythmias are stable.  2. ICD - his [employer] ICD is working normally.  3. Chronic systolic heart failure - his symptoms are class 2. He will continue his current meds.  [place].D.

## 2018-11-10 ENCOUNTER — Other Ambulatory Visit: Payer: Self-pay | Admitting: Internal Medicine

## 2018-11-10 MED ORDER — LOSARTAN POTASSIUM 25 MG PO TABS: 50.0000 mg | ORAL_TABLET | Freq: Every day | ORAL | 3 refills | Status: DC

## 2018-11-10 NOTE — Telephone Encounter (Signed)
Pt's medication was sent to pt's pharmacy as requested. Confirmation received.  °

## 2018-11-14 LAB — CUP PACEART REMOTE DEVICE CHECK
Battery Remaining Longevity: 120 mo
Battery Remaining Percentage: 100 %
Brady Statistic RA Percent Paced: 95 %
Brady Statistic RV Percent Paced: 12 %
Date Time Interrogation Session: 20201107070000
HighPow Impedance: 64 Ohm
Implantable Lead Implant Date: 20200710
Implantable Lead Implant Date: 20200710
Implantable Lead Implant Date: 20200710
Implantable Lead Location: 753858
Implantable Lead Location: 753859
Implantable Lead Location: 753860
Implantable Lead Model: 293
Implantable Lead Model: 4677
Implantable Lead Model: 7841
Implantable Lead Serial Number: 1006750
Implantable Lead Serial Number: 443715
Implantable Lead Serial Number: 814245
Implantable Pulse Generator Implant Date: 20200710
Lead Channel Impedance Value: 533 Ohm
Lead Channel Impedance Value: 631 Ohm
Lead Channel Impedance Value: 657 Ohm
Lead Channel Setting Pacing Amplitude: 3.5 V
Lead Channel Setting Pacing Amplitude: 3.5 V
Lead Channel Setting Pacing Amplitude: 3.5 V
Lead Channel Setting Pacing Pulse Width: 0.4 ms
Lead Channel Setting Pacing Pulse Width: 0.4 ms
Lead Channel Setting Sensing Sensitivity: 0.5 mV
Lead Channel Setting Sensing Sensitivity: 1 mV
Pulse Gen Serial Number: 228146

## 2018-11-17 ENCOUNTER — Telehealth: Payer: Self-pay | Admitting: Internal Medicine

## 2018-11-17 NOTE — Telephone Encounter (Signed)
New Message  Patient is calling in to speak with Dr. Tanna Furry nurse Sonia Baller. Please give patient a call back to discuss patient's return to from having defibrillator put in.

## 2018-11-18 ENCOUNTER — Ambulatory Visit (INDEPENDENT_AMBULATORY_CARE_PROVIDER_SITE_OTHER): Payer: BC Managed Care – PPO | Admitting: *Deleted

## 2018-11-18 DIAGNOSIS — I4901 Ventricular fibrillation: Secondary | ICD-10-CM

## 2018-11-18 DIAGNOSIS — I447 Left bundle-branch block, unspecified: Secondary | ICD-10-CM

## 2018-11-18 NOTE — Telephone Encounter (Signed)
Call returned to Pt.  Pt's concern is his employer wants him to return to work.  Pt has not completed disability work up.  REadvised Pt to call [facility].  Phone number given.  Advised this is his process to complete his disability work up.  Pt indicates understanding and thanked nurse for call.

## 2018-11-20 ENCOUNTER — Telehealth: Payer: Self-pay

## 2018-11-20 NOTE — Telephone Encounter (Signed)
Call back received from Pt regarding returning to work and disability.  Per Pt his employer wants him to return to work 11/22/18  Pt does not feel that he is able to return to work.  This nurse is unsure of what Pt should do.

## 2018-11-20 NOTE — Telephone Encounter (Signed)
Returned call to Pt.  Pt has not applied for unemployment yet.  Advised Pt to apply for unemployment because they will assist him with the paperwork that needs to be completed.  Also advised to call his employer and request FMLA paper work (Pt has not submitted FMLA paperwork to [employer]).  Pt indicates understanding.  Will make these outreaches today.

## 2018-11-30 ENCOUNTER — Telehealth: Payer: Self-pay | Admitting: Internal Medicine

## 2018-11-30 MED ORDER — METOPROLOL TARTRATE 25 MG PO TABS: 25.0000 mg | ORAL_TABLET | Freq: Two times a day (BID) | ORAL | 3 refills | Status: DC

## 2018-11-30 NOTE — Telephone Encounter (Signed)
Pt's medication was sent to pt's pharmacy as requested. Confirmation received.  °

## 2018-11-30 NOTE — Telephone Encounter (Signed)
°*  STAT* If patient is at the pharmacy, call can be transferred to refill team.   1. Which medications need to be refilled? (please list name of each medication and dose if known) new prescription  For Metoprolol- pt takes 1 tablet 2 times a day  2. Which pharmacy/location (including street and city if local pharmacy) is medication to be sent to? [facility] on [address], [place]  3. Do they need a 30 day or 90 day supply? 90 days and refills

## 2018-12-10 NOTE — Progress Notes (Signed)
Remote ICD transmission.   

## 2018-12-14 ENCOUNTER — Ambulatory Visit: Payer: BC Managed Care – PPO | Admitting: Physician Assistant

## 2018-12-14 NOTE — Progress Notes (Signed)
Cardiology Office Note:    Date:  12/15/2018   ID:  Fredonia WENZLICK, DOB [birth year], MRN [id number]  PCP:  Carol Ada, MD  Cardiologist:  Sherren Mocha, MD   Electrophysiologist:  Cristopher Peru, MD   Referring MD: Carol Ada, MD   Chief Complaint  Patient presents with  . Fatigue  . Shortness of Breath     History of Present Illness:    Kevin Rojas is a 63 y.o. male with:   Bicuspid AoV w severe AI  S/p bioprosthetic AVR 06/2018  Presented w OOH VF arrest (bystander CPR) 03/2018  Intraoperative AFib   Hx of VFib   Amiodarone Rx  Non-ischemic cardiomyopathy   EF 45-50 (echocardiogram 03/2018)  Normal coronary arteries at cath 03/2018  S/p CRT-D 07/2018  Pulmonary embolism  Noted on CT during workup for AVR 03/2018 >> Xarelto   Xarelto DC'd after CRT-D implant (procedure c/b hematoma); pt completed > 3 mos of a/c Rx  LBBB  Dilated Ao Root (4.2 cm on CT in 06/2018, asc aorta 4.3 cm)   Mr. Kimberley was last seen by Daune Perch, NP in 09/2018.  A follow up echocardiogram after that visit demonstrated improved LVF with EF 55-60%.  He returns for follow-up.  He does note problems with decreased exercise tolerance, fatigue and questionable shortness of breath.  He will often get weak just walking across his yard.  He has felt this way since his device was implanted.  He has not had chest pain, orthopnea, leg swelling or syncope.  He has not had pleuritic chest pain.    Prior CV studies:   The following studies were reviewed today:  Echocardiogram 10/02/2018 EF 55-60, severe LVH, Gr 1 DD, normal RVSF, trace MR, s/p AVR with mean 13 mmHg, asc Ao 41 mm   Echocardiogram 07/02/2018 EF 45-50, Gr 2 DD, s/p AVR with mean 18 mmHg  Carotid US 04/02/18 Bilat ICA 1-39  R/L Cardiac Catheterization 04/02/18 Normal right heart pressures. Mild global LV dysfunction with an EF 45 to 50%. Bicuspid aortic valve with severe 4+ aortic insufficiency, mild aortic stenosis.  Dilated aortic root. Normal coronary arteries and a left dominant circulation.  Chest/Abd/Pelvic CTA 04/04/2018 IMPRESSION: 1. New, acute subsegmental nonocclusive pulmonary embolism in branches of the right lower lobe and right middle lobe pulmonary arteries. Overall volume of thrombus burden is low. This thrombus was not present on the CTA dated 03/24/2018. 2. Mild aneurysmal disease involving the aortic root and ascending thoracic aorta with the root measuring 4.2 cm in greatest diameter and the ascending thoracic aorta measuring 4.3 cm in greatest diameter. Associated partially calcified aortic valve. The aortic arch is very tortuous distally and demonstrates some degree of focal kinking due to tortuosity without evidence of significant stenosis. No evidence of aortic dissection. 3. Left ventricular cavity dilatation. 4. No evidence of aneurysmal disease or significant obstructive disease involving the abdominal aorta, iliac arteries, common femoral arteries or visceral branches of the abdominal aorta.  Echo 03/25/18 EF 45-50, Gr 2 DD, mild RAE, trivial pericardial eff, bicuspid AoV, severe AI, mildly dilated Ao root   Past Medical History:  Diagnosis Date  . Aortic insufficiency   . Cardiac arrest ([facility]) 03/24/2018  . Dysrhythmia   . History of kidney stones   . Pneumonia    history of  . Pulmonary embolism ([facility])    04/04/18  . S/P AVR (aortic valve replacement)    Echocardiogram 07/03/2018: EF 45-50, septal-lateral dyssynchrony, mild LVH, grade 2 diastolic  dysfunction, normal RVSF, mild MR, bioprosthetic AoV with no regurgitation and mildly elevated mean gradient at 18, mild dilation of the ascending aorta (42), PASP 21  . Thoracic aortic aneurysm ([facility]) 06/24/2018   CT 03/2018:  Ao root 4.2 cm; ascending Aorta 4.3 cm   Surgical Hx: The patient  has a past surgical history that includes RIGHT/LEFT HEART CATH AND CORONARY ANGIOGRAPHY (N/A, 04/02/2018); AORTIC ARCH ANGIOGRAPHY  (N/A, 04/02/2018); Aortic valve replacement (N/A, 06/08/2018); TEE without cardioversion (N/A, 06/08/2018); Pacemaker insertion; and BIV ICD INSERTION CRT-D (N/A, 07/17/2018).   Current Medications: Current Meds  Medication Sig  . acetaminophen (TYLENOL) 325 MG tablet Take 1-2 tablets (325-650 mg total) by mouth every 4 (four) hours as needed for mild pain.  Marland Kitchen amiodarone (PACERONE) 200 MG tablet TAKE 1 TABLET(200 MG) BY MOUTH DAILY  . aspirin EC 81 MG tablet Take 1 tablet (81 mg total) by mouth daily.  Marland Kitchen losartan (COZAAR) 25 MG tablet Take 2 tablets (50 mg total) by mouth daily.  . metoprolol tartrate (LOPRESSOR) 25 MG tablet Take 1 tablet (25 mg total) by mouth 2 (two) times daily.     Allergies:   Patient has no known allergies.   Social History   Tobacco Use  . Smoking status: Never Smoker  . Smokeless tobacco: Never Used  Substance Use Topics  . Alcohol use: Never    Frequency: Never  . Drug use: Never     Family Hx: The patient's family history includes Prostate cancer in his father; Pulmonary embolism in his mother.  ROS:   Please see the history of present illness.    ROS All other systems reviewed and are negative.   EKGs/Labs/Other Test Reviewed:    EKG:  EKG is  ordered today.  The ekg ordered today demonstrates AV paced, HR 60, QTC 506  Recent Labs: 06/03/2018: ALT 16 06/12/2018: Magnesium 2.2; TSH 0.920 06/30/2018: Hemoglobin 11.4; Platelets 471 10/27/2018: BUN 14; Creatinine, Ser 1.51; Potassium 4.6; Sodium 141   Recent Lipid Panel No results found for: CHOL, TRIG, HDL, CHOLHDL, LDLCALC, LDLDIRECT  Physical Exam:    VS:  BP 140/90   Pulse 60   Ht '5\' 9"'$  (1.753 m)   Wt 162 lb 6.4 oz (73.7 kg)   SpO2 99%   BMI 23.98 kg/m     Wt Readings from Last 3 Encounters:  12/15/18 162 lb 6.4 oz (73.7 kg)  10/27/18 156 lb (70.8 kg)  10/22/18 157 lb (71.2 kg)     Physical Exam  Constitutional: He is oriented to person, place, and time. He appears well-developed and  well-nourished. No distress.  HENT:  Head: Normocephalic and atraumatic.  Eyes: No scleral icterus.  Neck: No JVD present. No thyromegaly present.  Cardiovascular: Normal rate, regular rhythm, S1 normal and S2 normal.  Murmur heard.  Systolic murmur is present with a grade of 1/6 at the upper left sternal border. Pulmonary/Chest: Effort normal and breath sounds normal. He has no rales.  Abdominal: Soft. There is no hepatomegaly.  Musculoskeletal:        General: No edema.  Lymphadenopathy:    He has no cervical adenopathy.  Neurological: He is alert and oriented to person, place, and time.  Skin: Skin is warm and dry.  Psychiatric: He has a normal mood and affect.    ASSESSMENT & PLAN:    1. SOB (shortness of breath) 2. Other fatigue Today, he notes symptoms of decreased exercise tolerance, fatigue and questionable shortness of breath.  These have been  ongoing since his device was implanted.  His EF is actually improved back to normal.  He does not really appear volume overloaded on exam.  Recent device interrogation did indicate that his heart failure index was elevated.  Question of volume excess may be contributing to his symptoms (diastolic heart failure).  He is also on amiodarone and hypothyroidism may be playing a role as well.  Question if his device settings could be adjusted for improved exercise tolerance.  -Obtain labs today: CMET, CBC, TSH, BNP  -If BNP is significantly elevated, start Lasix  -I will ask the device clinic to review his settings to see if any adjustments can be made   3. NICM (nonischemic cardiomyopathy) (HCC) Previous EF 45-50%.  He had normal coronary arteries prior to his aortic valve surgery.  Recent echo in September 2020 demonstrated improved LV function with an EF of 55-60%.  Continue losartan, metoprolol.    4. S/P AVR (aortic valve replacement) Recent echo in September 2020 demonstrated stable AVR.  Continue SBE prophylaxis.  5. Ventricular  fibrillation ([place]) 6. On amiodarone therapy Continue follow-up with Dr. Lovena Le as planned.  As he is complaining of shortness of breath, obtain chest x-ray.  Also obtain CMET, TSH  7. Biventricular ICD (implantable cardioverter-defibrillator) in place As noted, I will see if our device clinic can review his device settings to see if there are any adjustments that can be made to make him feel better.  8. Hypertension  BP above target.  He does have some diastolic dysfunction on his echocardiogram. If BNP normal, add HCTZ to his medications for better BP control.      Dispo:  Return in about 1 month (around 01/15/2019) for Routine Follow Up, w/ Dr. Burt Knack, or Richardson Dopp, PA-C, (virtual or in-person).   Medication Adjustments/Labs and Tests Ordered: Current medicines are reviewed at length with the patient today.  Concerns regarding medicines are outlined above.  Tests Ordered: Orders Placed This Encounter  Procedures  . DG Chest 2 View  . Comp Met (CMET)  . TSH  . CBC  . Pro b natriuretic peptide  . EKG 12-Lead   Medication Changes: No orders of the defined types were placed in this encounter.   Signed, Richardson Dopp, PA-C  12/15/2018 5:24 PM    [facility] [address], [place], [place]  [address] Phone: [phone]; Fax: [phone]

## 2018-12-15 ENCOUNTER — Other Ambulatory Visit: Payer: Self-pay

## 2018-12-15 ENCOUNTER — Ambulatory Visit
Admission: RE | Admit: 2018-12-15 | Discharge: 2018-12-15 | Disposition: A | Discharge status: Home / Self Care | Payer: BC Managed Care – PPO | Source: Ambulatory Visit | Attending: Physician Assistant | Admitting: Physician Assistant

## 2018-12-15 ENCOUNTER — Encounter: Payer: Self-pay | Admitting: Physician Assistant

## 2018-12-15 ENCOUNTER — Ambulatory Visit (INDEPENDENT_AMBULATORY_CARE_PROVIDER_SITE_OTHER): Payer: BC Managed Care – PPO | Admitting: Physician Assistant

## 2018-12-15 VITALS — BP 140/90 | HR 60 | Ht 69.0 in | Wt 162.4 lb

## 2018-12-15 DIAGNOSIS — I428 Other cardiomyopathies: Secondary | ICD-10-CM

## 2018-12-15 DIAGNOSIS — I4901 Ventricular fibrillation: Secondary | ICD-10-CM

## 2018-12-15 DIAGNOSIS — Z79899 Other long term (current) drug therapy: Secondary | ICD-10-CM

## 2018-12-15 DIAGNOSIS — Z952 Presence of prosthetic heart valve: Secondary | ICD-10-CM

## 2018-12-15 DIAGNOSIS — R5383 Other fatigue: Secondary | ICD-10-CM | POA: Diagnosis not present

## 2018-12-15 DIAGNOSIS — R0602 Shortness of breath: Secondary | ICD-10-CM

## 2018-12-15 DIAGNOSIS — Z95810 Presence of automatic (implantable) cardiac defibrillator: Secondary | ICD-10-CM

## 2018-12-15 DIAGNOSIS — I10 Essential (primary) hypertension: Secondary | ICD-10-CM

## 2018-12-15 NOTE — Patient Instructions (Addendum)
Medication Instructions:   Your physician recommends that you continue on your current medications as directed. Please refer to the Current Medication list given to you today.  *If you need a refill on your cardiac medications before your next appointment, please call your pharmacy*  Lab Work:  CMET TSH CBC BNP TODAY   If you have labs (blood work) drawn today and your tests are completely normal, you will receive your results only by: Marland Kitchen MyChart Message (if you have MyChart) OR . A paper copy in the mail If you have any lab test that is abnormal or we need to change your treatment, we will call you to review the results.  Testing/Procedures: A chest x-ray takes a picture of the organs and structures inside the chest, including the heart, lungs, and blood vessels. This test can show several things, including, whether the heart is enlarges; whether fluid is building up in the lungs; and whether pacemaker / defibrillator leads are still in place. Directions to location below   Directions: [facility] at [address], [place], [place] [address] Hours of Operation Monday - Friday 8 am - 5 pm    Follow-Up: At [facility], you and your health needs are our priority.  As part of our continuing mission to provide you with exceptional heart care, we have created designated Provider Care Teams.  These Care Teams include your primary Cardiologist (physician) and Advanced Practice Providers (APPs -  Physician Assistants and Nurse Practitioners) who all work together to provide you with the care you need, when you need it.  Your next appointment:   1 month(s)  The format for your next appointment:   Either In Person or Virtual  Provider:   You may see Sherren Mocha, MDor one of the following Advanced Practice Providers on your designated Care Team:    Richardson Dopp, PA-C   Other Instructions  [place] DAILY- CALL OFFICE  GAIN 3 LBS IN A DAY

## 2018-12-16 LAB — PRO B NATRIURETIC PEPTIDE: NT-Pro BNP: 118 pg/mL (ref 0–210)

## 2018-12-16 LAB — COMPREHENSIVE METABOLIC PANEL
ALT: 28 IU/L (ref 0–44)
AST: 23 IU/L (ref 0–40)
Albumin/Globulin Ratio: 1.3 (ref 1.2–2.2)
Albumin: 4.1 g/dL (ref 3.8–4.8)
Alkaline Phosphatase: 78 IU/L (ref 39–117)
BUN/Creatinine Ratio: 7 — ABNORMAL LOW (ref 10–24)
BUN: 10 mg/dL (ref 8–27)
Bilirubin Total: 0.7 mg/dL (ref 0.0–1.2)
CO2: 24 mmol/L (ref 20–29)
Calcium: 9.1 mg/dL (ref 8.6–10.2)
Chloride: 104 mmol/L (ref 96–106)
Creatinine, Ser: 1.36 mg/dL — ABNORMAL HIGH (ref 0.76–1.27)
GFR calc Af Amer: 64 mL/min/{1.73_m2} (ref >59)
GFR calc non Af Amer: 55 mL/min/{1.73_m2} — ABNORMAL LOW (ref >59)
Globulin, Total: 3.1 g/dL (ref 1.5–4.5)
Glucose: 88 mg/dL (ref 65–99)
Potassium: 4.7 mmol/L (ref 3.5–5.2)
Sodium: 141 mmol/L (ref 134–144)
Total Protein: 7.2 g/dL (ref 6.0–8.5)

## 2018-12-16 LAB — CBC
Hematocrit: 44.5 % (ref 37.5–51.0)
Hemoglobin: 14.4 g/dL (ref 13.0–17.7)
MCH: 27 pg (ref 26.6–33.0)
MCHC: 32.4 g/dL (ref 31.5–35.7)
MCV: 84 fL (ref 79–97)
Platelets: 222 10*3/uL (ref 150–450)
RBC: 5.33 x10E6/uL (ref 4.14–5.80)
RDW: 16.7 % — ABNORMAL HIGH (ref 11.6–15.4)
WBC: 4.7 10*3/uL (ref 3.4–10.8)

## 2018-12-16 LAB — TSH: TSH: 0.969 u[IU]/mL (ref 0.450–4.500)

## 2018-12-17 ENCOUNTER — Telehealth: Payer: Self-pay | Admitting: Physician Assistant

## 2018-12-17 DIAGNOSIS — Z79899 Other long term (current) drug therapy: Secondary | ICD-10-CM

## 2018-12-17 DIAGNOSIS — I10 Essential (primary) hypertension: Secondary | ICD-10-CM

## 2018-12-17 NOTE — Telephone Encounter (Signed)
LMTCB on pts Home and mobile numbers.

## 2018-12-17 NOTE — Telephone Encounter (Signed)
Please call patient. I will have the device clinic call him to arrange a follow up to adjust his device. I would like to start him on HCTZ to better manage his BP.  Start HCTZ 12.5 mg once daily   BMET 2 weeks Richardson Dopp, PA-C    12/17/2018 1:57 PM

## 2018-12-22 ENCOUNTER — Other Ambulatory Visit: Payer: Self-pay | Admitting: Internal Medicine

## 2018-12-22 ENCOUNTER — Ambulatory Visit (INDEPENDENT_AMBULATORY_CARE_PROVIDER_SITE_OTHER): Payer: BC Managed Care – PPO | Admitting: *Deleted

## 2018-12-22 ENCOUNTER — Other Ambulatory Visit: Payer: Self-pay

## 2018-12-22 DIAGNOSIS — I428 Other cardiomyopathies: Secondary | ICD-10-CM

## 2018-12-22 DIAGNOSIS — Z95810 Presence of automatic (implantable) cardiac defibrillator: Secondary | ICD-10-CM | POA: Diagnosis not present

## 2018-12-22 DIAGNOSIS — I4901 Ventricular fibrillation: Secondary | ICD-10-CM

## 2018-12-22 MED ORDER — HYDROCHLOROTHIAZIDE 12.5 MG PO CAPS: 12.5000 mg | ORAL_CAPSULE | Freq: Every day | ORAL | 3 refills | Status: DC

## 2018-12-22 NOTE — Telephone Encounter (Signed)
I spoke to the patient with Scott's recommendation.  He will start HCTZ 12.5 mg Daily and come in on 12/29 (BMET).  He verbalized understanding.

## 2018-12-25 LAB — CUP PACEART INCLINIC DEVICE CHECK
Brady Statistic RA Percent Paced: 96 %
Brady Statistic RV Percent Paced: 12 %
Date Time Interrogation Session: 20201215000000
HighPow Impedance: 65 Ohm
Implantable Lead Implant Date: 20200710
Implantable Lead Implant Date: 20200710
Implantable Lead Implant Date: 20200710
Implantable Lead Location: 753858
Implantable Lead Location: 753859
Implantable Lead Location: 753860
Implantable Lead Model: 293
Implantable Lead Model: 4677
Implantable Lead Model: 7841
Implantable Lead Serial Number: 1006750
Implantable Lead Serial Number: 443715
Implantable Lead Serial Number: 814245
Implantable Pulse Generator Implant Date: 20200710
Lead Channel Impedance Value: 496 Ohm
Lead Channel Impedance Value: 623 Ohm
Lead Channel Impedance Value: 657 Ohm
Lead Channel Pacing Threshold Amplitude: 0.6 V
Lead Channel Pacing Threshold Amplitude: 0.8 V
Lead Channel Pacing Threshold Amplitude: 1.3 V
Lead Channel Pacing Threshold Pulse Width: 0.4 ms
Lead Channel Pacing Threshold Pulse Width: 0.4 ms
Lead Channel Pacing Threshold Pulse Width: 0.4 ms
Lead Channel Sensing Intrinsic Amplitude: 24.9 mV
Lead Channel Sensing Intrinsic Amplitude: 25 mV
Lead Channel Sensing Intrinsic Amplitude: 9.4 mV
Lead Channel Setting Pacing Amplitude: 2 V
Lead Channel Setting Pacing Amplitude: 2.5 V
Lead Channel Setting Pacing Amplitude: 2.5 V
Lead Channel Setting Pacing Pulse Width: 0.4 ms
Lead Channel Setting Pacing Pulse Width: 0.4 ms
Lead Channel Setting Sensing Sensitivity: 0.5 mV
Lead Channel Setting Sensing Sensitivity: 1 mV
Pulse Gen Serial Number: 228146

## 2018-12-25 NOTE — Progress Notes (Signed)
CRT-D device check in office for sensor reprogramming. Thresholds and sensing consistent with previous device measurements. Lead impedance trends stable over time. No mode switch episodes recorded. No ventricular arrhythmia episodes recorded. Patient LV pacing 100% of the time. Device programmed with appropriate safety margins. Histograms blunted, enabled rate response (per Richardson Dopp, PA) with response factor of 10, patient walked laps in office with appropriate increase in HR. Heart failure diagnostics reviewed and trends are stable for patient. RA/RV/LV outputs optimized per protocol. Estimated longevity 11 years. Patient enrolled in remote follow up. Patient education completed including shock plan. Latitude on 02/17/19 and ROV with GT in 07/2019.

## 2019-01-05 ENCOUNTER — Other Ambulatory Visit: Payer: Self-pay

## 2019-01-05 ENCOUNTER — Other Ambulatory Visit: Payer: BC Managed Care – PPO | Admitting: *Deleted

## 2019-01-05 ENCOUNTER — Encounter: Payer: Self-pay | Admitting: *Deleted

## 2019-01-05 DIAGNOSIS — Z79899 Other long term (current) drug therapy: Secondary | ICD-10-CM | POA: Diagnosis not present

## 2019-01-05 DIAGNOSIS — I10 Essential (primary) hypertension: Secondary | ICD-10-CM

## 2019-01-05 LAB — BASIC METABOLIC PANEL
BUN/Creatinine Ratio: 8 — ABNORMAL LOW (ref 10–24)
BUN: 14 mg/dL (ref 8–27)
CO2: 25 mmol/L (ref 20–29)
Calcium: 9.3 mg/dL (ref 8.6–10.2)
Chloride: 102 mmol/L (ref 96–106)
Creatinine, Ser: 1.76 mg/dL — ABNORMAL HIGH (ref 0.76–1.27)
GFR calc Af Amer: 47 mL/min/{1.73_m2} — ABNORMAL LOW (ref >59)
GFR calc non Af Amer: 40 mL/min/{1.73_m2} — ABNORMAL LOW (ref >59)
Glucose: 98 mg/dL (ref 65–99)
Potassium: 4.5 mmol/L (ref 3.5–5.2)
Sodium: 140 mmol/L (ref 134–144)

## 2019-01-06 ENCOUNTER — Other Ambulatory Visit: Payer: Self-pay

## 2019-01-06 DIAGNOSIS — I10 Essential (primary) hypertension: Secondary | ICD-10-CM

## 2019-01-06 NOTE — Progress Notes (Signed)
Repeat BMET scheduled for 01/20/19.

## 2019-01-14 NOTE — Progress Notes (Signed)
Virtual Visit via Telephone Note   This visit type was conducted due to national recommendations for restrictions regarding the COVID-19 Pandemic (e.g. social distancing) in an effort to limit this patient's exposure and mitigate transmission in our community.  Due to his co-morbid illnesses, this patient is at least at moderate risk for complications without adequate follow up.  This format is felt to be most appropriate for this patient at this time.  The patient did not have access to video technology/had technical difficulties with video requiring transitioning to audio format only (telephone).  All issues noted in this document were discussed and addressed.  No physical exam could be performed with this format.  Please refer to the patient's chart for his  consent to telehealth for [facility].   Date:  01/15/2019   ID:  Kevin Rojas, DOB 06/23/1955, MRN [id number]  Patient Location: Home Provider Location: Home  PCP:  Carol Ada, MD  Cardiologist:  Sherren Mocha, MD   Electrophysiologist:  Cristopher Peru, MD   Evaluation Performed:  Follow-Up Visit  Chief Complaint:  Fatigue, hx AVR  History of Present Illness:    Kevin Rojas is a 64 y.o. male with    Bicuspid AoV w severe AI ? S/p bioprosthetic AVR 06/2018 ? Presented w OOH VF arrest (bystander CPR) 03/2018 ? Intraoperative AFib   Hx of VFib  ? Amiodarone Rx  Non-ischemic cardiomyopathy  ? EF 45-50 (echocardiogram 03/2018) ? Normal coronary arteries at cath 03/2018 ? EF 55-60 (echocardiogram 09/2018)  S/p CRT-D 07/2018  Pulmonary embolism ? Noted on CT during workup for AVR 03/2018 >> Xarelto  ? Xarelto DC'd after CRT-D implant (procedure c/b hematoma); pt completed > 3 mos of a/c Rx  LBBB  Dilated Ao Root (4.2 cm on CT in 06/2018, asc aorta 4.3 cm)  I saw him in 12/2018.  He complained of shortness of breath and fatigue.  BNP was normal.  CXR was unremarkable.  I had him seen in the device clinic and his  rate response was adjusted.    Today, he notes he is feeling much better.  He has more energy and is now able to do more things.  He has not had shortness of breath, chest pain, syncope.     Past Medical History:  Diagnosis Date  . Abnormal CXR 03/24/2018  . Aortic insufficiency   . Cardiac arrest ([place]) 03/24/2018  . Cognitive disorder   . Constipation 04/07/2018  . Debility 04/05/2018  . Dyspepsia --felt to be due to dysmotility 04/10/2018  . Dysrhythmia   . Elevated troponin 03/24/2018  . Endotracheally intubated 03/24/2018  . History of implantable cardioverter-defibrillator (ICD) placement 07/18/2018  . History of kidney stones   . Hypokalemia 03/24/2018  . LBBB (left bundle branch block) 03/24/2018  . Pneumonia    history of  . Pulmonary embolism ([place])    04/04/18  . S/P AVR (aortic valve replacement)    Echocardiogram 07/03/2018: EF 45-50, septal-lateral dyssynchrony, mild LVH, grade 2 diastolic dysfunction, normal RVSF, mild MR, bioprosthetic AoV with no regurgitation and mildly elevated mean gradient at 18, mild dilation of the ascending aorta (42), PASP 21  . Thoracic aortic aneurysm ([place]) 06/24/2018   CT 03/2018:  Ao root 4.2 cm; ascending Aorta 4.3 cm  . Ventricular fibrillation ([place]) 03/24/2018   Past Surgical History:  Procedure Laterality Date  . AORTIC ARCH ANGIOGRAPHY N/A 04/02/2018   Procedure: AORTIC ARCH ANGIOGRAPHY;  Surgeon: Troy Sine, MD;  Location: [place]  CV LAB;  Service: Cardiovascular;  Laterality: N/A;  . AORTIC VALVE REPLACEMENT N/A 06/08/2018   Procedure: AORTIC VALVE REPLACEMENT (AVR), ON PUMP, USING MAGNA EASE AORTIC BIOPROSTHESIS VALVE ;  Surgeon: Delight Ovens, MD;  Location: [facility] OR;  Service: Open Heart Surgery;  Laterality: N/A;  . BIV ICD INSERTION CRT-D N/A 07/17/2018   Procedure: BIV ICD INSERTION CRT-D;  Surgeon: Marinus Maw, MD;  Location: [facility] INVASIVE CV LAB;  Service: Cardiovascular;  Laterality: N/A;  . PACEMAKER INSERTION    .  RIGHT/LEFT HEART CATH AND CORONARY ANGIOGRAPHY N/A 04/02/2018   Procedure: RIGHT/LEFT HEART CATH AND CORONARY ANGIOGRAPHY;  Surgeon: Lennette Bihari, MD;  Location: [facility] INVASIVE CV LAB;  Service: Cardiovascular;  Laterality: N/A;  . TEE WITHOUT CARDIOVERSION N/A 06/08/2018   Procedure: TRANSESOPHAGEAL ECHOCARDIOGRAM (TEE);  Surgeon: Delight Ovens, MD;  Location: [facility] OR;  Service: Open Heart Surgery;  Laterality: N/A;     Current Meds  Medication Sig  . acetaminophen (TYLENOL) 325 MG tablet Take 1-2 tablets (325-650 mg total) by mouth every 4 (four) hours as needed for mild pain.  Marland Kitchen amiodarone (PACERONE) 200 MG tablet TAKE 1 TABLET(200 MG) BY MOUTH DAILY  . aspirin EC 81 MG tablet Take 1 tablet (81 mg total) by mouth daily.  . hydrochlorothiazide (MICROZIDE) 12.5 MG capsule Take 1 capsule (12.5 mg total) by mouth daily.  Marland Kitchen losartan (COZAAR) 25 MG tablet Take 2 tablets (50 mg total) by mouth daily.  . metoprolol tartrate (LOPRESSOR) 25 MG tablet Take 1 tablet (25 mg total) by mouth 2 (two) times daily.     Allergies:   Patient has no known allergies.   Social History   Tobacco Use  . Smoking status: Never Smoker  . Smokeless tobacco: Never Used  Substance Use Topics  . Alcohol use: Never  . Drug use: Never     Family Hx: The patient's family history includes Prostate cancer in his father; Pulmonary embolism in his mother.  ROS:   Please see the history of present illness.    All other systems reviewed and are negative.   Prior CV studies:   The following studies were reviewed today:  Echocardiogram 10/02/2018 EF 55-60, severe LVH, Gr 1 DD, normal RVSF, trace MR, s/p AVR with mean 13 mmHg, asc Ao 41 mm   Echocardiogram 07/02/2018 EF 45-50, Gr 2 DD, s/p AVR with mean 18 mmHg  Carotid US 04/02/18 Bilat ICA 1-39  R/LCardiac Catheterization3/26/20 Normal right heart pressures. Mild global LV dysfunction with an EF 45 to 50%. Bicuspid aortic valve with severe 4+ aortic  insufficiency, mild aortic stenosis. Dilated aortic root. Normal coronary arteries and a left dominant circulation.  Chest/Abd/Pelvic CTA 04/04/2018 IMPRESSION: 1. New, acute subsegmental nonocclusive pulmonary embolism in branches of the right lower lobe and right middle lobe pulmonary arteries. Overall volume of thrombus burden is low. This thrombus was not present on the CTA dated 03/24/2018. 2. Mild aneurysmal disease involving the aortic root and ascending thoracic aorta with the root measuring 4.2 cm in greatest diameter and the ascending thoracic aorta measuring 4.3 cm in greatest diameter. Associated partially calcified aortic valve. The aortic arch is very tortuous distally and demonstrates some degree of focal kinking due to tortuosity without evidence of significant stenosis. No evidence of aortic dissection. 3. Left ventricular cavity dilatation. 4. No evidence of aneurysmal disease or significant obstructive disease involving the abdominal aorta, iliac arteries, common femoral arteries or visceral branches of the abdominal aorta.  Echo 03/25/18 EF  45-50, Gr 2 DD, mild RAE, trivial pericardial eff, bicuspid AoV, severe AI, mildly dilated Ao root  Labs/Other Tests and Data Reviewed:    EKG:  No ECG reviewed.  Recent Labs: 06/12/2018: Magnesium 2.2 12/15/2018: ALT 28; Hemoglobin 14.4; NT-Pro BNP 118; Platelets 222; TSH 0.969 01/05/2019: BUN 14; Creatinine, Ser 1.76; Potassium 4.5; Sodium 140   Recent Lipid Panel No results found for: CHOL, TRIG, HDL, CHOLHDL, LDLCALC, LDLDIRECT  Wt Readings from Last 3 Encounters:  12/15/18 162 lb 6.4 oz (73.7 kg)  10/27/18 156 lb (70.8 kg)  10/22/18 157 lb (71.2 kg)     Objective:    Vital Signs:  Ht 5\' 9"  (1.753 m)   BMI 23.98 kg/m    VITAL SIGNS:  reviewed GEN:  no acute distress RESPIRATORY:  no labored breathing NEURO:  alert and oriented PSYCH:  normal mood  ASSESSMENT & PLAN:    1. Other fatigue 2. Biventricular  ICD (implantable cardioverter-defibrillator) in place He has felt much better since rate response adjustments were made on his device.  No further evaluation needed.   3. S/P AVR (aortic valve replacement) Stable prosthetic aortic valve gradient by echocardiogram in 09/2018.  Continue SBE prophylaxis.    4. Essential hypertension The patient's blood pressure is controlled on his current regimen.  Continue current therapy.      Time:   Today, I have spent 8 minutes with the patient with telehealth technology discussing the above problems.     Medication Adjustments/Labs and Tests Ordered: Current medicines are reviewed at length with the patient today.  Concerns regarding medicines are outlined above.   Tests Ordered: No orders of the defined types were placed in this encounter.   Medication Changes: No orders of the defined types were placed in this encounter.   Follow Up:  Either In Person or Virtual in 6 month(s)  Signed, 10/2018, PA-C  01/15/2019 10:16 AM    [facility]

## 2019-01-15 ENCOUNTER — Telehealth (INDEPENDENT_AMBULATORY_CARE_PROVIDER_SITE_OTHER): Payer: BC Managed Care – PPO | Admitting: Physician Assistant

## 2019-01-15 ENCOUNTER — Other Ambulatory Visit: Payer: Self-pay

## 2019-01-15 VITALS — Ht 69.0 in

## 2019-01-15 DIAGNOSIS — Z95810 Presence of automatic (implantable) cardiac defibrillator: Secondary | ICD-10-CM

## 2019-01-15 DIAGNOSIS — I10 Essential (primary) hypertension: Secondary | ICD-10-CM

## 2019-01-15 DIAGNOSIS — R5383 Other fatigue: Secondary | ICD-10-CM

## 2019-01-15 DIAGNOSIS — Z952 Presence of prosthetic heart valve: Secondary | ICD-10-CM

## 2019-01-15 NOTE — Patient Instructions (Signed)
Medication Instructions:  Your physician recommends that you continue on your current medications as directed. Please refer to the Current Medication list given to you today.  *If you need a refill on your cardiac medications before your next appointment, please call your pharmacy*  Lab Work: none If you have labs (blood work) drawn today and your tests are completely normal, you will receive your results only by: Marland Kitchen MyChart Message (if you have MyChart) OR . A paper copy in the mail If you have any lab test that is abnormal or we need to change your treatment, we will call you to review the results.  Testing/Procedures: none  Follow-Up: At [facility], you and your health needs are our priority.  As part of our continuing mission to provide you with exceptional heart care, we have created designated Provider Care Teams.  These Care Teams include your primary Cardiologist (physician) and Advanced Practice Providers (APPs -  Physician Assistants and Nurse Practitioners) who all work together to provide you with the care you need, when you need it.  Your next appointment:   6 month(s)  The format for your next appointment:   Either In Person or Virtual---whichever you would prefer  Provider:   You may see Tonny Bollman, MD or one of the following Advanced Practice Providers on your designated Care Team:    Tereso Newcomer, PA-C  Vin Damar, [place]  Berton Bon, NP   Other Instructions

## 2019-01-20 ENCOUNTER — Other Ambulatory Visit: Payer: Self-pay | Admitting: *Deleted

## 2019-01-20 ENCOUNTER — Other Ambulatory Visit: Payer: Self-pay

## 2019-01-20 DIAGNOSIS — I10 Essential (primary) hypertension: Secondary | ICD-10-CM

## 2019-01-20 LAB — BASIC METABOLIC PANEL
BUN/Creatinine Ratio: 7 — ABNORMAL LOW (ref 10–24)
BUN: 13 mg/dL (ref 8–27)
CO2: 26 mmol/L (ref 20–29)
Calcium: 9.5 mg/dL (ref 8.6–10.2)
Chloride: 100 mmol/L (ref 96–106)
Creatinine, Ser: 1.86 mg/dL — ABNORMAL HIGH (ref 0.76–1.27)
GFR calc Af Amer: 44 mL/min/{1.73_m2} — ABNORMAL LOW (ref >59)
GFR calc non Af Amer: 38 mL/min/{1.73_m2} — ABNORMAL LOW (ref >59)
Glucose: 87 mg/dL (ref 65–99)
Potassium: 4.4 mmol/L (ref 3.5–5.2)
Sodium: 139 mmol/L (ref 134–144)

## 2019-01-22 ENCOUNTER — Telehealth: Payer: Self-pay | Admitting: Physician Assistant

## 2019-01-22 DIAGNOSIS — I10 Essential (primary) hypertension: Secondary | ICD-10-CM

## 2019-01-22 MED ORDER — AMLODIPINE BESYLATE 2.5 MG PO TABS: 2.5000 mg | ORAL_TABLET | Freq: Every day | ORAL | 1 refills | Status: DC

## 2019-01-22 NOTE — Telephone Encounter (Signed)
Patient is returning call in regards to lab results. States you can call both numbers.

## 2019-01-22 NOTE — Telephone Encounter (Signed)
The patient has been notified of the result and verbalized understanding.  All questions (if any) were answered. Patient will stop HCTZ and begin Amlodipine 2.5MG , 1 tablet by mouth once a day. Patient will monitor blood pressures and send readings in 2 weeks after starting Amlodipine. Patient will come back on 02/08/19 for repeat BMET. Lajoyce Corners, CMA 01/22/2019 9:55 AM

## 2019-02-08 ENCOUNTER — Other Ambulatory Visit: Payer: Self-pay

## 2019-02-08 ENCOUNTER — Other Ambulatory Visit: Payer: Self-pay | Admitting: *Deleted

## 2019-02-08 DIAGNOSIS — I10 Essential (primary) hypertension: Secondary | ICD-10-CM

## 2019-02-09 LAB — BASIC METABOLIC PANEL
BUN/Creatinine Ratio: 9 — ABNORMAL LOW (ref 10–24)
BUN: 14 mg/dL (ref 8–27)
CO2: 26 mmol/L (ref 20–29)
Calcium: 9.2 mg/dL (ref 8.6–10.2)
Chloride: 105 mmol/L (ref 96–106)
Creatinine, Ser: 1.55 mg/dL — ABNORMAL HIGH (ref 0.76–1.27)
GFR calc Af Amer: 54 mL/min/{1.73_m2} — ABNORMAL LOW (ref >59)
GFR calc non Af Amer: 47 mL/min/{1.73_m2} — ABNORMAL LOW (ref >59)
Glucose: 79 mg/dL (ref 65–99)
Potassium: 4.4 mmol/L (ref 3.5–5.2)
Sodium: 142 mmol/L (ref 134–144)

## 2019-02-17 ENCOUNTER — Ambulatory Visit (INDEPENDENT_AMBULATORY_CARE_PROVIDER_SITE_OTHER): Payer: BC Managed Care – PPO | Admitting: *Deleted

## 2019-02-17 DIAGNOSIS — I428 Other cardiomyopathies: Secondary | ICD-10-CM

## 2019-02-17 LAB — CUP PACEART REMOTE DEVICE CHECK
Battery Remaining Longevity: 132 mo
Battery Remaining Percentage: 100 %
Brady Statistic RA Percent Paced: 99 %
Brady Statistic RV Percent Paced: 9 %
Date Time Interrogation Session: 20210210102900
HighPow Impedance: 62 Ohm
Implantable Lead Implant Date: 20200710
Implantable Lead Implant Date: 20200710
Implantable Lead Implant Date: 20200710
Implantable Lead Location: 753858
Implantable Lead Location: 753859
Implantable Lead Location: 753860
Implantable Lead Model: 293
Implantable Lead Model: 4677
Implantable Lead Model: 7841
Implantable Lead Serial Number: 1006750
Implantable Lead Serial Number: 443715
Implantable Lead Serial Number: 814245
Implantable Pulse Generator Implant Date: 20200710
Lead Channel Impedance Value: 496 Ohm
Lead Channel Impedance Value: 628 Ohm
Lead Channel Impedance Value: 705 Ohm
Lead Channel Setting Pacing Amplitude: 2 V
Lead Channel Setting Pacing Amplitude: 2.5 V
Lead Channel Setting Pacing Amplitude: 2.5 V
Lead Channel Setting Pacing Pulse Width: 0.4 ms
Lead Channel Setting Pacing Pulse Width: 0.4 ms
Lead Channel Setting Sensing Sensitivity: 0.5 mV
Lead Channel Setting Sensing Sensitivity: 1 mV
Pulse Gen Serial Number: 228146

## 2019-02-17 NOTE — Progress Notes (Signed)
ICD Remote  

## 2019-04-15 ENCOUNTER — Other Ambulatory Visit: Payer: Self-pay

## 2019-04-15 ENCOUNTER — Ambulatory Visit: Payer: 59 | Admitting: Cardiothoracic Surgery

## 2019-04-15 VITALS — BP 129/90 | HR 60 | Temp 97.3°F | Resp 16 | Ht 69.0 in | Wt 164.0 lb

## 2019-04-15 DIAGNOSIS — Z952 Presence of prosthetic heart valve: Secondary | ICD-10-CM | POA: Diagnosis not present

## 2019-04-15 DIAGNOSIS — I351 Nonrheumatic aortic (valve) insufficiency: Secondary | ICD-10-CM

## 2019-04-15 DIAGNOSIS — Q231 Congenital insufficiency of aortic valve: Secondary | ICD-10-CM | POA: Diagnosis not present

## 2019-04-15 NOTE — Progress Notes (Signed)
[address]       [place] [address]             [phone]      RUBY DILONE [facility] Medical Record #[id number] Date of Birth: February 06, 1955  Referring: [name], MD Primary Care: [name], MD Primary Cardiologist: Tonny Bollman, MD   Chief Complaint:   POST OP FOLLOW UP OPERATIVE REPORT DATE OF PROCEDURE:  06/08/2018 PREOPERATIVE DIAGNOSES:  Severe aortic insufficiency with history of ventricular fibrillation arrest at home. POSTOPERATIVE DIAGNOSES:  Severe aortic insufficiency with history of ventricular fibrillation arrest at home. SURGICAL PROCEDURE:  Aortic valve replacement with a pericardial tissue valve, [employer], model 3300 TFX, 25 mm, serial [id number]  History of Present Illness:     Patient returns after aortic valve replacement for severe aortic insufficiency.  He originally presented with out-of-hospital cardiac arrest.   The patient is making good progress following his surgical replacement of his aortic valve for severe aortic insufficiency.   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   He denies any signs or symptoms of congestive heart failure  He has full dentures, we did discuss antibiotic prophylaxis for any invasive procedures   Past Medical History:  Diagnosis Date  . Abnormal CXR 03/24/2018  . Aortic insufficiency   . Cardiac arrest ([facility]) 03/24/2018  . Cognitive disorder   . Constipation 04/07/2018  . Debility 04/05/2018  . Dyspepsia --felt to be due to dysmotility 04/10/2018  . Dysrhythmia   . Elevated troponin 03/24/2018  . Endotracheally intubated 03/24/2018  . History of implantable cardioverter-defibrillator (ICD) placement 07/18/2018  . History of kidney stones   . Hypokalemia 03/24/2018  . LBBB (left bundle branch block) 03/24/2018  . Pneumonia    history of  . Pulmonary embolism ([facility])    04/04/18  . S/P AVR (aortic valve  replacement)    Echocardiogram 07/03/2018: EF 45-50, septal-lateral dyssynchrony, mild LVH, grade 2 diastolic dysfunction, normal RVSF, mild MR, bioprosthetic AoV with no regurgitation and mildly elevated mean gradient at 18, mild dilation of the ascending aorta (42), PASP 21  . Thoracic aortic aneurysm ([facility]) 06/24/2018   CT 03/2018:  Ao root 4.2 cm; ascending Aorta 4.3 cm  . Ventricular fibrillation (H[facility]) 03/24/2018     Social History   Tobacco Use  Smoking Status Never Smoker  Smokeless Tobacco Never Used    Social History   Substance and Sexual Activity  Alcohol Use Never     No Known Allergies  Current Outpatient Medications  Medication Sig Dispense Refill  . acetaminophen (TYLENOL) 325 MG tablet Take 1-2 tablets (325-650 mg total) by mouth every 4 (four) hours as needed for mild pain.    Marland Kitchen amiodarone (PACERONE) 200 MG tablet TAKE 1 TABLET(200 MG) BY MOUTH DAILY 90 tablet 2  . amLODipine (NORVASC) 2.5 MG tablet Take 1 tablet (2.5 mg total) by mouth daily. 90 tablet 1  . aspirin EC 81 MG tablet Take 1 tablet (81 mg total) by mouth daily. 150 tablet 2  . losartan (COZAAR) 25 MG tablet Take 2 tablets (50 mg total) by mouth daily. 180 tablet 3  . metoprolol tartrate (LOPRESSOR) 25 MG tablet Take 1 tablet (25 mg total) by mouth 2 (two) times daily. 180 tablet 3   No current facility-administered medications for this visit.       Physical Exam: BP 129/90 (BP Location: Right Arm)  Pulse 60   Temp (!) 97.3 F (36.3 C) (Temporal)   Resp 16   Ht 5\' 9"  (1.753 m)   Wt 164 lb (74.4 kg)   SpO2 98% Comment: RA  BMI 24.22 kg/m  General appearance: alert, cooperative and no distress Neck: no adenopathy, no carotid bruit, no JVD, supple, symmetrical, trachea midline and thyroid not enlarged, symmetric, no tenderness/mass/nodules Resp: clear to auscultation bilaterally Cardio: regular rate and rhythm, S1, S2 normal, no murmur, click, rub or gallop GI: soft, non-tender; bowel sounds  normal; no masses,  no organomegaly Extremities: extremities normal, atraumatic, no cyanosis or edema and Homans sign is negative, no sign of DVT Neurologic: Grossly normal  Diagnostic Studies & Laboratory data:     Recent Radiology Findings:    Recent Lab Findings: Lab Results  Component Value Date   WBC 4.7 12/15/2018   HGB 14.4 12/15/2018   HCT 44.5 12/15/2018   PLT 222 12/15/2018   GLUCOSE 79 02/08/2019   ALT 28 12/15/2018   AST 23 12/15/2018   NA 142 02/08/2019   K 4.4 02/08/2019   CL 105 02/08/2019   CREATININE 1.55 (H) 02/08/2019   BUN 14 02/08/2019   CO2 26 02/08/2019   TSH 0.969 12/15/2018   INR 1.9 (H) 06/08/2018   HGBA1C 5.0 06/03/2018      Assessment / Plan:   #1 stable after aortic valve replacement with pericardial tissue valve #2 original presentation with out-of-hospital V. fib arrest-now with AICD in place  Patient making good progress following replacement of his aortic valve, after presenting with out-of-hospital cardiac arrest, just at the beginning of Covid pandemic.   His aorta was very mildly dilated at 4.2 cm, it also had a pulmonary embolus during his original admission.  He will return in 6 months with a CTA of the chest to evaluate his aorta   Medication Changes: No orders of the defined types were placed in this encounter.     Grace Isaac MD      [address] [place] [address] Office [phone]   Beeper [phone]  04/15/2019 11:08 AM

## 2019-05-19 ENCOUNTER — Ambulatory Visit (INDEPENDENT_AMBULATORY_CARE_PROVIDER_SITE_OTHER): Payer: 59 | Admitting: *Deleted

## 2019-05-19 DIAGNOSIS — I447 Left bundle-branch block, unspecified: Secondary | ICD-10-CM

## 2019-05-19 DIAGNOSIS — I4901 Ventricular fibrillation: Secondary | ICD-10-CM | POA: Diagnosis not present

## 2019-05-19 LAB — CUP PACEART REMOTE DEVICE CHECK
Battery Remaining Longevity: 132 mo
Battery Remaining Percentage: 100 %
Brady Statistic RA Percent Paced: 99 %
Brady Statistic RV Percent Paced: 9 %
Date Time Interrogation Session: 20210512102800
HighPow Impedance: 61 Ohm
Implantable Lead Implant Date: 20200710
Implantable Lead Implant Date: 20200710
Implantable Lead Implant Date: 20200710
Implantable Lead Location: 753858
Implantable Lead Location: 753859
Implantable Lead Location: 753860
Implantable Lead Model: 293
Implantable Lead Model: 4677
Implantable Lead Model: 7841
Implantable Lead Serial Number: 1006750
Implantable Lead Serial Number: 443715
Implantable Lead Serial Number: 814245
Implantable Pulse Generator Implant Date: 20200710
Lead Channel Impedance Value: 502 Ohm
Lead Channel Impedance Value: 631 Ohm
Lead Channel Impedance Value: 682 Ohm
Lead Channel Setting Pacing Amplitude: 2 V
Lead Channel Setting Pacing Amplitude: 2.5 V
Lead Channel Setting Pacing Amplitude: 2.5 V
Lead Channel Setting Pacing Pulse Width: 0.4 ms
Lead Channel Setting Pacing Pulse Width: 0.4 ms
Lead Channel Setting Sensing Sensitivity: 0.5 mV
Lead Channel Setting Sensing Sensitivity: 1 mV
Pulse Gen Serial Number: 228146

## 2019-05-21 NOTE — Progress Notes (Signed)
Remote ICD transmission.   

## 2019-07-09 ENCOUNTER — Other Ambulatory Visit: Payer: Self-pay | Admitting: Physician Assistant

## 2019-08-18 ENCOUNTER — Ambulatory Visit (INDEPENDENT_AMBULATORY_CARE_PROVIDER_SITE_OTHER): Payer: 59 | Admitting: *Deleted

## 2019-08-18 DIAGNOSIS — I4901 Ventricular fibrillation: Secondary | ICD-10-CM | POA: Diagnosis not present

## 2019-08-18 LAB — CUP PACEART REMOTE DEVICE CHECK
Battery Remaining Longevity: 132 mo
Battery Remaining Percentage: 100 %
Brady Statistic RA Percent Paced: 99 %
Brady Statistic RV Percent Paced: 7 %
Date Time Interrogation Session: 20210811093700
HighPow Impedance: 67 Ohm
Implantable Lead Implant Date: 20200710
Implantable Lead Implant Date: 20200710
Implantable Lead Implant Date: 20200710
Implantable Lead Location: 753858
Implantable Lead Location: 753859
Implantable Lead Location: 753860
Implantable Lead Model: 293
Implantable Lead Model: 4677
Implantable Lead Model: 7841
Implantable Lead Serial Number: 1006750
Implantable Lead Serial Number: 443715
Implantable Lead Serial Number: 814245
Implantable Pulse Generator Implant Date: 20200710
Lead Channel Impedance Value: 517 Ohm
Lead Channel Impedance Value: 602 Ohm
Lead Channel Impedance Value: 718 Ohm
Lead Channel Setting Pacing Amplitude: 2 V
Lead Channel Setting Pacing Amplitude: 2.5 V
Lead Channel Setting Pacing Amplitude: 2.5 V
Lead Channel Setting Pacing Pulse Width: 0.4 ms
Lead Channel Setting Pacing Pulse Width: 0.4 ms
Lead Channel Setting Sensing Sensitivity: 0.5 mV
Lead Channel Setting Sensing Sensitivity: 1 mV
Pulse Gen Serial Number: 228146

## 2019-08-23 NOTE — Progress Notes (Signed)
Remote ICD transmission.   

## 2019-09-27 ENCOUNTER — Other Ambulatory Visit: Payer: Self-pay | Admitting: Cardiothoracic Surgery

## 2019-09-27 DIAGNOSIS — I712 Thoracic aortic aneurysm, without rupture, unspecified: Secondary | ICD-10-CM

## 2019-10-03 ENCOUNTER — Other Ambulatory Visit: Payer: Self-pay | Admitting: Internal Medicine

## 2019-10-04 ENCOUNTER — Other Ambulatory Visit: Payer: Self-pay

## 2019-10-04 MED ORDER — AMIODARONE HCL 200 MG PO TABS: ORAL_TABLET | ORAL | 0 refills | Status: DC

## 2019-10-20 NOTE — Progress Notes (Signed)
[address]       [place] [address]             [phone]      Kevin Rojas [facility] Medical Record #[id number] Date of Birth: December 17, 1955  Referring: [name], MD Primary Care: [name], MD Primary Cardiologist: Tonny Bollman, MD   Chief Complaint:   POST OP FOLLOW UP OPERATIVE REPORT DATE OF PROCEDURE:  06/08/2018 PREOPERATIVE DIAGNOSES:  Severe aortic insufficiency with history of ventricular fibrillation arrest at home. POSTOPERATIVE DIAGNOSES:  Severe aortic insufficiency with history of ventricular fibrillation arrest at home. SURGICAL PROCEDURE:  Aortic valve replacement with a pericardial tissue valve, Edwards [employer]esciences, model 3300 TFX, 25 mm, serial [id number]  History of Present Illness:     Patient returns after aortic valve replacement for severe aortic insufficiency.  He originally presented with out-of-hospital cardiac arrest.  His wife also a patient with a mechanical aortic valve in place provided CPR for him.  The patient is made good progress recovering both from his out-of-hospital cardiac arrest and cardiac surgery.    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   He denies any signs or symptoms of congestive heart failure  He has full dentures, we did discuss antibiotic prophylaxis for any invasive procedures   Past Medical History:  Diagnosis Date  . Abnormal CXR 03/24/2018  . Aortic insufficiency   . Cardiac arrest (HCC) 03/24/2018  . Cognitive disorder   . Constipation 04/07/2018  . Debility 04/05/2018  . Dyspepsia --felt to be due to dysmotility 04/10/2018  . Dysrhythmia   . Elevated troponin 03/24/2018  . Endotracheally intubated 03/24/2018  . History of implantable cardioverter-defibrillator (ICD) placement 07/18/2018  . History of kidney stones   . Hypokalemia 03/24/2018  . LBBB (left bundle branch block) 03/24/2018  . Pneumonia    history of  .  Pulmonary embolism (HCC)    04/04/18  . S/P AVR (aortic valve replacement)    Echocardiogram 07/03/2018: EF 45-50, septal-lateral dyssynchrony, mild LVH, grade 2 diastolic dysfunction, normal RVSF, mild MR, bioprosthetic AoV with no regurgitation and mildly elevated mean gradient at 18, mild dilation of the ascending aorta (42), PASP 21  . Thoracic aortic aneurysm (HCC) 06/24/2018   CT 03/2018:  Ao root 4.2 cm; ascending Aorta 4.3 cm  . Ventricular fibrillation (HCC) 03/24/2018     Social History   Tobacco Use  Smoking Status Never Smoker  Smokeless Tobacco Never Used    Social History   Substance and Sexual Activity  Alcohol Use Never     No Known Allergies  Current Outpatient Medications  Medication Sig Dispense Refill  . acetaminophen (TYLENOL) 325 MG tablet Take 1-2 tablets (325-650 mg total) by mouth every 4 (four) hours as needed for mild pain.    Marland Kitchen amiodarone (PACERONE) 200 MG tablet TAKE 1 TABLET(200 MG) BY MOUTH DAILY 90 tablet 0  . amLODipine (NORVASC) 2.5 MG tablet TAKE 1 TABLET(2.5 MG) BY MOUTH DAILY 90 tablet 1  . losartan (COZAAR) 25 MG tablet TAKE 2 TABLETS(50 MG) BY MOUTH DAILY 180 tablet 3  . metoprolol tartrate (LOPRESSOR) 25 MG tablet TAKE 1 TABLET(25 MG) BY MOUTH TWICE DAILY 180 tablet 3   No current facility-administered medications for this visit.       Physical Exam: BP (!) 132/96 (BP Location: Left Arm, Patient Position: Sitting, Cuff Size: Normal)   Pulse 61   Temp  97.7 F (36.5 C) (Skin)   Resp 18   Ht 5\' 9"  (1.753 m)   Wt 158 lb 12.8 oz (72 kg)   SpO2 100% Comment: RA  BMI 23.45 kg/m  General appearance: alert, cooperative and no distress Head: Normocephalic, without obvious abnormality, atraumatic Resp: clear to auscultation bilaterally Cardio: regular rate and rhythm, S1, S2 normal, no murmur, click, rub or gallop GI: soft, non-tender; bowel sounds normal; no masses,  no organomegaly Extremities: extremities normal, atraumatic, no  cyanosis or edema and Homans sign is negative, no sign of DVT Neurologic: Grossly normal  Diagnostic Studies & Laboratory data:     Recent Radiology Findings:   CT ANGIO CHEST AORTA W/CM & OR WO/CM  Result Date: 10/21/2019 CLINICAL DATA:  Thoracic aortic aneurysm. EXAM: CT ANGIOGRAPHY CHEST WITH CONTRAST TECHNIQUE: Multidetector CT imaging of the chest was performed using the standard protocol during bolus administration of intravenous contrast. Multiplanar CT image reconstructions and MIPs were obtained to evaluate the vascular anatomy. CONTRAST:  29mL ISOVUE-370 IOPAMIDOL (ISOVUE-370) INJECTION 76% COMPARISON:  April 04, 2018. FINDINGS: Cardiovascular: Grossly stable 4.3 cm ascending thoracic aortic aneurysm is noted. Status post aortic valve replacement. No dissection is noted. Great vessels are widely patent without significant stenosis. Stable tortuosity of distal portion of transverse aortic arch and proximal descending thoracic aorta is noted. Normal cardiac size. No pericardial effusion. Left-sided pacemaker is unchanged. Mediastinum/Nodes: No enlarged mediastinal, hilar, or axillary lymph nodes. Thyroid gland, trachea, and esophagus demonstrate no significant findings. Lungs/Pleura: Lungs are clear. No pleural effusion or pneumothorax. Upper Abdomen: No acute abnormality. Musculoskeletal: No chest wall abnormality. No acute or significant osseous findings. Review of the MIP images confirms the above findings. IMPRESSION: 1. Grossly stable 4.3 cm ascending thoracic aortic aneurysm. Recommend annual imaging followup by CTA or MRA. This recommendation follows 2010 ACCF/AHA/AATS/ACR/ASA/SCA/SCAI/SIR/STS/SVM Guidelines for the Diagnosis and Management of Patients with Thoracic Aortic Disease. Circulation. 2010; 1212011. Aortic aneurysm NOS (ICD10-I71.9). 2. Status post aortic valve replacement. 3. No other abnormality seen in the chest. Electronically Signed   By: : [id number] M.D.   On:  10/21/2019 14:23   I have independently reviewed the above radiology studies  and reviewed the findings with the patient.    Recent Lab Findings: Lab Results  Component Value Date   WBC 4.7 12/15/2018   HGB 14.4 12/15/2018   HCT 44.5 12/15/2018   PLT 222 12/15/2018   GLUCOSE 79 02/08/2019   ALT 28 12/15/2018   AST 23 12/15/2018   NA 142 02/08/2019   K 4.4 02/08/2019   CL 105 02/08/2019   CREATININE 1.55 (H) 02/08/2019   BUN 14 02/08/2019   CO2 26 02/08/2019   TSH 0.969 12/15/2018   INR 1.9 (H) 06/08/2018   HGBA1C 5.0 06/03/2018   Patient Name:  Kevin Rojas Date of Exam: 10/02/2018  Medical Rec #: 10/04/2018    Height:    69.0 in  Accession #:  [id number]   Weight:    152.8 lb  Date of Birth: 30-Jun-1955   BSA:     1.84 m  Patient Age:  62 years    BP:      141/96 mmHg  Patient Gender: M        HR:      60 bpm.  Exam Location: [address]   Procedure: 2D Echo, Cardiac Doppler and Color Doppler   Indications:  Z95.2 S/P AVR; I50.22 CHF    History:    Patient has prior history  of Echocardiogram examinations,  most         recent 07/02/2018. Defibrillator; Arrythmias:Cardiac Arrest  and         LBBB Risk Factors:Hypertension. Bicuspid aortic valve.         Bioprosthetic aortic valve replacement. NICM. Dialted  aortic         root. Thoracic aortic aneurysm. History of pulmonary  embolism.    Sonographer:  Cathie Beams RCS  Referring Phys: [id number] [name] HAMMOND   IMPRESSIONS    1. Left ventricular ejection fraction, by visual estimation, is 55 to  60%. The left ventricle has normal function. Normal left ventricular size.  There is severely increased left ventricular hypertrophy.  2. Left ventricular diastolic Doppler parameters are consistent with  impaired relaxation pattern of LV diastolic filling.  3. Global right ventricle has normal systolic function.The  right  ventricular size is normal.  4. Left atrial size was normal.  5. Right atrial size was normal.  6. The mitral valve is normal in structure. Trace mitral valve  regurgitation. No evidence of mitral stenosis.  7. The tricuspid valve is normal in structure. Tricuspid valve  regurgitation is trivial.  8. Aortic valve regurgitation is trivial by color flow Doppler.  Structurally normal aortic valve, with no evidence of sclerosis or  stenosis.  9. The pulmonic valve was normal in structure. Pulmonic valve  regurgitation is not visualized by color flow Doppler.  10. Aortic dilatation noted.  11. There is mild dilatation of the ascending aorta measuring 41 mm.  12. A pacer wire is visualized in the RV.  13. The inferior vena cava is normal in size with greater than 50%  respiratory variability, suggesting right atrial pressure of 3 mmHg.  14. Normal LV systolic function; severe LVH; grade 1 diastolic  dysfunction; mildly dilated ascending aorta; s/p AVR with mean gradient of  13 mmHg and no AI.   FINDINGS  Left Ventricle: Left ventricular ejection fraction, by visual estimation,  is 55 to 60%. The left ventricle has normal function. There is severely  increased left ventricular hypertrophy. Normal left ventricular size.  Spectral Doppler shows Left  ventricular diastolic Doppler parameters are consistent with impaired  relaxation pattern of LV diastolic filling.   Right Ventricle: The right ventricular size is normal.Global RV systolic  function is has normal systolic function.   Left Atrium: Left atrial size was normal in size.   Right Atrium: Right atrial size was normal in size   Pericardium: There is no evidence of pericardial effusion.   Mitral Valve: The mitral valve is normal in structure. No evidence of  mitral valve stenosis by observation. Trace mitral valve regurgitation.   Tricuspid Valve: The tricuspid valve is normal in structure. Tricuspid  valve  regurgitation is trivial by color flow Doppler.   Aortic Valve: The aortic valve has been repaired/replaced. Aortic valve  regurgitation is trivial by color flow Doppler. The aortic valve is  structurally normal, with no evidence of sclerosis or stenosis. Aortic  valve mean gradient measures 13.3 mmHg.  Aortic valve peak gradient measures 23.0 mmHg. Aortic valve area, by VTI  measures 1.15 cm.   Pulmonic Valve: The pulmonic valve was normal in structure. Pulmonic valve  regurgitation is not visualized by color flow Doppler.   Aorta: Aortic dilatation noted. There is mild dilatation of the ascending  aorta measuring 41 mm.   Venous: The inferior vena cava is normal in size with greater than 50%  respiratory variability, suggesting right atrial pressure  of 3 mmHg.   IAS/Shunts: No atrial level shunt detected by color flow Doppler.   Additional Comments: A pacer wire is visualized in the right ventricle.    LEFT VENTRICLE  PLAX 2D  LVIDd:     3.90 cm Diastology  LVIDs:     2.80 cm LV e' lateral:  7.29 cm/s  LV PW:     1.60 cm LV E/e' lateral: 8.8  LV IVS:    1.20 cm LV e' medial:  4.90 cm/s  LVOT diam:   2.00 cm LV E/e' medial: 13.1  LV SV:     36 ml  LV SV Index:  19.78  LVOT Area:   3.14 cm     RIGHT VENTRICLE  RV Basal diam: 2.50 cm  RV S prime:   11.00 cm/s  TAPSE (M-mode): 1.6 cm   LEFT ATRIUM       Index    RIGHT ATRIUM      Index  LA diam:    2.10 cm 1.14 cm/m RA Area:   19.40 cm  LA Vol (A2C):  36.2 ml 19.65 ml/m RA Volume:  51.60 ml 28.00 ml/m  LA Vol (A4C):  17.7 ml 9.61 ml/m  LA Biplane Vol: 24.8 ml 13.46 ml/m  AORTIC VALVE  AV Area (Vmax):  1.10 cm  AV Area (Vmean):  1.05 cm  AV Area (VTI):   1.15 cm  AV Vmax:      240.00 cm/s  AV Vmean:     172.000 cm/s  AV VTI:      0.477 m  AV Peak Grad:   23.0 mmHg  AV Mean Grad:   13.3 mmHg  LVOT Vmax:     84.20  cm/s  LVOT Vmean:    57.300 cm/s  LVOT VTI:     0.175 m  LVOT/AV VTI ratio: 0.37   MITRAL VALVE  MV Area (PHT): 3.85 cm       SHUNTS  MV PHT:    57.13 msec      Systemic VTI: 0.18 m  MV Decel Time: 197 msec       Systemic Diam: 2.00 cm  MV E velocity: 64.30 cm/s 103 cm/s  MV A velocity: 85.30 cm/s 70.3 cm/s  MV E/A ratio: 0.75    1.5     Olga Millers MD  Electronically signed by Olga Millers MD  Signature Date/Time: 10/02/2018/12:09:06 PM      Assessment / Plan:   #1 stable after aortic valve replacement with pericardial tissue valve-plan follow-up CTA of the chest for evaluation of his aorta in 18 months #2 original presentation with out-of-hospital V. fib arrest-now with AICD in place #3 poorly controlled/difficult control hypertension particularly diastolic hypertension-currently being evaluated by primary care and cardiology   Medication Changes: No orders of the defined types were placed in this encounter.     Delight Ovens MD      [address] [place] [place] [address] Office [phone]   Beeper [phone]  10/22/2019 9:29 AM

## 2019-10-21 ENCOUNTER — Encounter: Payer: Self-pay | Admitting: Cardiothoracic Surgery

## 2019-10-21 ENCOUNTER — Ambulatory Visit (INDEPENDENT_AMBULATORY_CARE_PROVIDER_SITE_OTHER): Payer: 59 | Admitting: Cardiothoracic Surgery

## 2019-10-21 ENCOUNTER — Ambulatory Visit
Admission: RE | Admit: 2019-10-21 | Discharge: 2019-10-21 | Disposition: A | Discharge status: Home / Self Care | Payer: 59 | Source: Ambulatory Visit | Attending: Cardiothoracic Surgery | Admitting: Cardiothoracic Surgery

## 2019-10-21 ENCOUNTER — Other Ambulatory Visit: Payer: Self-pay

## 2019-10-21 VITALS — BP 132/96 | HR 61 | Temp 97.7°F | Resp 18 | Ht 69.0 in | Wt 158.8 lb

## 2019-10-21 DIAGNOSIS — I712 Thoracic aortic aneurysm, without rupture, unspecified: Secondary | ICD-10-CM

## 2019-10-21 DIAGNOSIS — Z952 Presence of prosthetic heart valve: Secondary | ICD-10-CM

## 2019-10-21 MED ORDER — IOPAMIDOL (ISOVUE-370) INJECTION 76%
60.0000 mL | Freq: Once | INTRAVENOUS | Status: AC | PRN
  Administered 2019-10-21: 60 mL via INTRAVENOUS

## 2019-10-21 MED ORDER — IOPAMIDOL (ISOVUE-300) INJECTION 61%: 100.0000 mL | Freq: Once | INTRAVENOUS | Status: DC | PRN

## 2019-11-17 ENCOUNTER — Ambulatory Visit (INDEPENDENT_AMBULATORY_CARE_PROVIDER_SITE_OTHER): Payer: 59

## 2019-11-17 DIAGNOSIS — I4901 Ventricular fibrillation: Secondary | ICD-10-CM

## 2019-11-18 LAB — CUP PACEART REMOTE DEVICE CHECK
Battery Remaining Longevity: 132 mo
Battery Remaining Percentage: 100 %
Brady Statistic RA Percent Paced: 99 %
Brady Statistic RV Percent Paced: 6 %
Date Time Interrogation Session: 20211110104100
HighPow Impedance: 68 Ohm
Implantable Lead Implant Date: 20200710
Implantable Lead Implant Date: 20200710
Implantable Lead Implant Date: 20200710
Implantable Lead Location: 753858
Implantable Lead Location: 753859
Implantable Lead Location: 753860
Implantable Lead Model: 293
Implantable Lead Model: 4677
Implantable Lead Model: 7841
Implantable Lead Serial Number: 1006750
Implantable Lead Serial Number: 443715
Implantable Lead Serial Number: 814245
Implantable Pulse Generator Implant Date: 20200710
Lead Channel Impedance Value: 530 Ohm
Lead Channel Impedance Value: 567 Ohm
Lead Channel Impedance Value: 727 Ohm
Lead Channel Setting Pacing Amplitude: 2 V
Lead Channel Setting Pacing Amplitude: 2.5 V
Lead Channel Setting Pacing Amplitude: 2.5 V
Lead Channel Setting Pacing Pulse Width: 0.4 ms
Lead Channel Setting Pacing Pulse Width: 0.4 ms
Lead Channel Setting Sensing Sensitivity: 0.5 mV
Lead Channel Setting Sensing Sensitivity: 1 mV
Pulse Gen Serial Number: 228146

## 2019-11-19 NOTE — Progress Notes (Signed)
Remote ICD transmission.   

## 2019-12-09 ENCOUNTER — Telehealth: Payer: Self-pay | Admitting: Physician Assistant

## 2019-12-09 MED ORDER — METOPROLOL TARTRATE 25 MG PO TABS
ORAL_TABLET | ORAL | 0 refills | Status: DC
Start: 2019-12-09 — End: 2021-06-28

## 2019-12-09 NOTE — Telephone Encounter (Signed)
Pt's medication was sent to pt's pharmacy as requested. Confirmation received.  °

## 2019-12-09 NOTE — Telephone Encounter (Signed)
*  STAT* If patient is at the pharmacy, call can be transferred to refill team.   1. Which medications need to be refilled? (please list name of each medication and dose if known) metoprolol tartrate (LOPRESSOR) 25 MG tablet  2. Which pharmacy/location (including street and city if local pharmacy) is medication to be sent to? [id number] - [place], [place] - [address] AT [address]  3. Do they need a 30 day or 90 day supply? 90  

## 2020-01-01 ENCOUNTER — Other Ambulatory Visit: Payer: Self-pay | Admitting: Cardiovascular Disease

## 2020-02-16 ENCOUNTER — Ambulatory Visit (INDEPENDENT_AMBULATORY_CARE_PROVIDER_SITE_OTHER): Payer: 59

## 2020-02-16 DIAGNOSIS — I4901 Ventricular fibrillation: Secondary | ICD-10-CM | POA: Diagnosis not present

## 2020-02-18 LAB — CUP PACEART REMOTE DEVICE CHECK
Battery Remaining Longevity: 132 mo
Battery Remaining Percentage: 100 %
Brady Statistic RA Percent Paced: 99 %
Brady Statistic RV Percent Paced: 9 %
Date Time Interrogation Session: 20220209092400
HighPow Impedance: 70 Ohm
Implantable Lead Implant Date: 20200710
Implantable Lead Implant Date: 20200710
Implantable Lead Implant Date: 20200710
Implantable Lead Location: 753858
Implantable Lead Location: 753859
Implantable Lead Location: 753860
Implantable Lead Model: 293
Implantable Lead Model: 4677
Implantable Lead Model: 7841
Implantable Lead Serial Number: 1006750
Implantable Lead Serial Number: 443715
Implantable Lead Serial Number: 814245
Implantable Pulse Generator Implant Date: 20200710
Lead Channel Impedance Value: 526 Ohm
Lead Channel Impedance Value: 537 Ohm
Lead Channel Impedance Value: 711 Ohm
Lead Channel Setting Pacing Amplitude: 2 V
Lead Channel Setting Pacing Amplitude: 2.5 V
Lead Channel Setting Pacing Amplitude: 2.5 V
Lead Channel Setting Pacing Pulse Width: 0.4 ms
Lead Channel Setting Pacing Pulse Width: 0.4 ms
Lead Channel Setting Sensing Sensitivity: 0.5 mV
Lead Channel Setting Sensing Sensitivity: 1 mV
Pulse Gen Serial Number: 228146

## 2020-02-22 NOTE — Progress Notes (Signed)
Remote ICD transmission.   

## 2020-03-28 ENCOUNTER — Other Ambulatory Visit: Payer: Self-pay | Admitting: Cardiovascular Disease

## 2020-05-12 ENCOUNTER — Other Ambulatory Visit: Payer: Self-pay | Admitting: Cardiovascular Disease

## 2020-05-17 ENCOUNTER — Ambulatory Visit (INDEPENDENT_AMBULATORY_CARE_PROVIDER_SITE_OTHER): Payer: 59

## 2020-05-17 DIAGNOSIS — I428 Other cardiomyopathies: Secondary | ICD-10-CM

## 2020-05-18 LAB — CUP PACEART REMOTE DEVICE CHECK
Battery Remaining Longevity: 126 mo
Battery Remaining Percentage: 100 %
Brady Statistic RA Percent Paced: 100 %
Brady Statistic RV Percent Paced: 8 %
Date Time Interrogation Session: 20220511123900
HighPow Impedance: 72 Ohm
Implantable Lead Implant Date: 20200710
Implantable Lead Implant Date: 20200710
Implantable Lead Implant Date: 20200710
Implantable Lead Location: 753858
Implantable Lead Location: 753859
Implantable Lead Location: 753860
Implantable Lead Model: 293
Implantable Lead Model: 4677
Implantable Lead Model: 7841
Implantable Lead Serial Number: 1006750
Implantable Lead Serial Number: 443715
Implantable Lead Serial Number: 814245
Implantable Pulse Generator Implant Date: 20200710
Lead Channel Impedance Value: 511 Ohm
Lead Channel Impedance Value: 528 Ohm
Lead Channel Impedance Value: 699 Ohm
Lead Channel Setting Pacing Amplitude: 2 V
Lead Channel Setting Pacing Amplitude: 2.5 V
Lead Channel Setting Pacing Amplitude: 2.5 V
Lead Channel Setting Pacing Pulse Width: 0.4 ms
Lead Channel Setting Pacing Pulse Width: 0.4 ms
Lead Channel Setting Sensing Sensitivity: 0.5 mV
Lead Channel Setting Sensing Sensitivity: 1 mV
Pulse Gen Serial Number: 228146

## 2020-06-08 NOTE — Progress Notes (Signed)
Remote ICD transmission.   

## 2020-08-16 ENCOUNTER — Ambulatory Visit (INDEPENDENT_AMBULATORY_CARE_PROVIDER_SITE_OTHER): Payer: 59

## 2020-08-16 DIAGNOSIS — I428 Other cardiomyopathies: Secondary | ICD-10-CM

## 2020-08-17 LAB — CUP PACEART REMOTE DEVICE CHECK
Battery Remaining Longevity: 126 mo
Battery Remaining Percentage: 100 %
Brady Statistic RA Percent Paced: 100 %
Brady Statistic RV Percent Paced: 8 %
Date Time Interrogation Session: 20220810145200
HighPow Impedance: 71 Ohm
Implantable Lead Implant Date: 20200710
Implantable Lead Implant Date: 20200710
Implantable Lead Implant Date: 20200710
Implantable Lead Location: 753858
Implantable Lead Location: 753859
Implantable Lead Location: 753860
Implantable Lead Model: 293
Implantable Lead Model: 4677
Implantable Lead Model: 7841
Implantable Lead Serial Number: 1006750
Implantable Lead Serial Number: 443715
Implantable Lead Serial Number: 814245
Implantable Pulse Generator Implant Date: 20200710
Lead Channel Impedance Value: 501 Ohm
Lead Channel Impedance Value: 507 Ohm
Lead Channel Impedance Value: 697 Ohm
Lead Channel Setting Pacing Amplitude: 2 V
Lead Channel Setting Pacing Amplitude: 2.5 V
Lead Channel Setting Pacing Amplitude: 2.5 V
Lead Channel Setting Pacing Pulse Width: 0.4 ms
Lead Channel Setting Pacing Pulse Width: 0.4 ms
Lead Channel Setting Sensing Sensitivity: 0.5 mV
Lead Channel Setting Sensing Sensitivity: 1 mV
Pulse Gen Serial Number: 228146

## 2020-09-07 NOTE — Progress Notes (Signed)
Remote ICD transmission.   

## 2020-11-15 ENCOUNTER — Ambulatory Visit (INDEPENDENT_AMBULATORY_CARE_PROVIDER_SITE_OTHER): Payer: Medicare HMO

## 2020-11-15 DIAGNOSIS — I4901 Ventricular fibrillation: Secondary | ICD-10-CM | POA: Diagnosis not present

## 2020-11-17 LAB — CUP PACEART REMOTE DEVICE CHECK
Battery Remaining Longevity: 126 mo
Battery Remaining Percentage: 100 %
Brady Statistic RA Percent Paced: 100 %
Brady Statistic RV Percent Paced: 8 %
Date Time Interrogation Session: 20221110171100
HighPow Impedance: 73 Ohm
Implantable Lead Implant Date: 20200710
Implantable Lead Implant Date: 20200710
Implantable Lead Implant Date: 20200710
Implantable Lead Location: 753858
Implantable Lead Location: 753859
Implantable Lead Location: 753860
Implantable Lead Model: 293
Implantable Lead Model: 4677
Implantable Lead Model: 7841
Implantable Lead Serial Number: 1006750
Implantable Lead Serial Number: 443715
Implantable Lead Serial Number: 814245
Implantable Pulse Generator Implant Date: 20200710
Lead Channel Impedance Value: 515 Ohm
Lead Channel Impedance Value: 534 Ohm
Lead Channel Impedance Value: 747 Ohm
Lead Channel Setting Pacing Amplitude: 2 V
Lead Channel Setting Pacing Amplitude: 2.5 V
Lead Channel Setting Pacing Amplitude: 2.5 V
Lead Channel Setting Pacing Pulse Width: 0.4 ms
Lead Channel Setting Pacing Pulse Width: 0.4 ms
Lead Channel Setting Sensing Sensitivity: 0.5 mV
Lead Channel Setting Sensing Sensitivity: 1 mV
Pulse Gen Serial Number: 228146

## 2020-11-24 NOTE — Progress Notes (Signed)
Remote ICD transmission.   

## 2021-02-14 ENCOUNTER — Ambulatory Visit (INDEPENDENT_AMBULATORY_CARE_PROVIDER_SITE_OTHER): Payer: Medicare (Managed Care)

## 2021-02-14 DIAGNOSIS — I428 Other cardiomyopathies: Secondary | ICD-10-CM

## 2021-02-14 LAB — CUP PACEART REMOTE DEVICE CHECK
Battery Remaining Longevity: 120 mo
Battery Remaining Percentage: 100 %
Brady Statistic RA Percent Paced: 100 %
Brady Statistic RV Percent Paced: 8 %
Date Time Interrogation Session: 20230208020100
HighPow Impedance: 63 Ohm
Implantable Lead Implant Date: 20200710
Implantable Lead Implant Date: 20200710
Implantable Lead Implant Date: 20200710
Implantable Lead Location: 753858
Implantable Lead Location: 753859
Implantable Lead Location: 753860
Implantable Lead Model: 293
Implantable Lead Model: 4677
Implantable Lead Model: 7841
Implantable Lead Serial Number: 1006750
Implantable Lead Serial Number: 443715
Implantable Lead Serial Number: 814245
Implantable Pulse Generator Implant Date: 20200710
Lead Channel Impedance Value: 491 Ohm
Lead Channel Impedance Value: 495 Ohm
Lead Channel Impedance Value: 703 Ohm
Lead Channel Setting Pacing Amplitude: 2 V
Lead Channel Setting Pacing Amplitude: 2.5 V
Lead Channel Setting Pacing Amplitude: 2.5 V
Lead Channel Setting Pacing Pulse Width: 0.4 ms
Lead Channel Setting Pacing Pulse Width: 0.4 ms
Lead Channel Setting Sensing Sensitivity: 0.5 mV
Lead Channel Setting Sensing Sensitivity: 1 mV
Pulse Gen Serial Number: 228146

## 2021-02-19 NOTE — Progress Notes (Signed)
Remote ICD transmission.   

## 2021-03-12 ENCOUNTER — Other Ambulatory Visit: Payer: Self-pay | Admitting: Surgery

## 2021-03-12 DIAGNOSIS — I7121 Aneurysm of the ascending aorta, without rupture: Secondary | ICD-10-CM

## 2021-03-27 IMAGING — DX PORTABLE CHEST - 1 VIEW
1 series · 1 of 1 positions shown · non-contrast
Comparison: 06/08/18

CLINICAL DATA: Status post AVR

EXAM:
PORTABLE CHEST 1 VIEW

[chest ap]
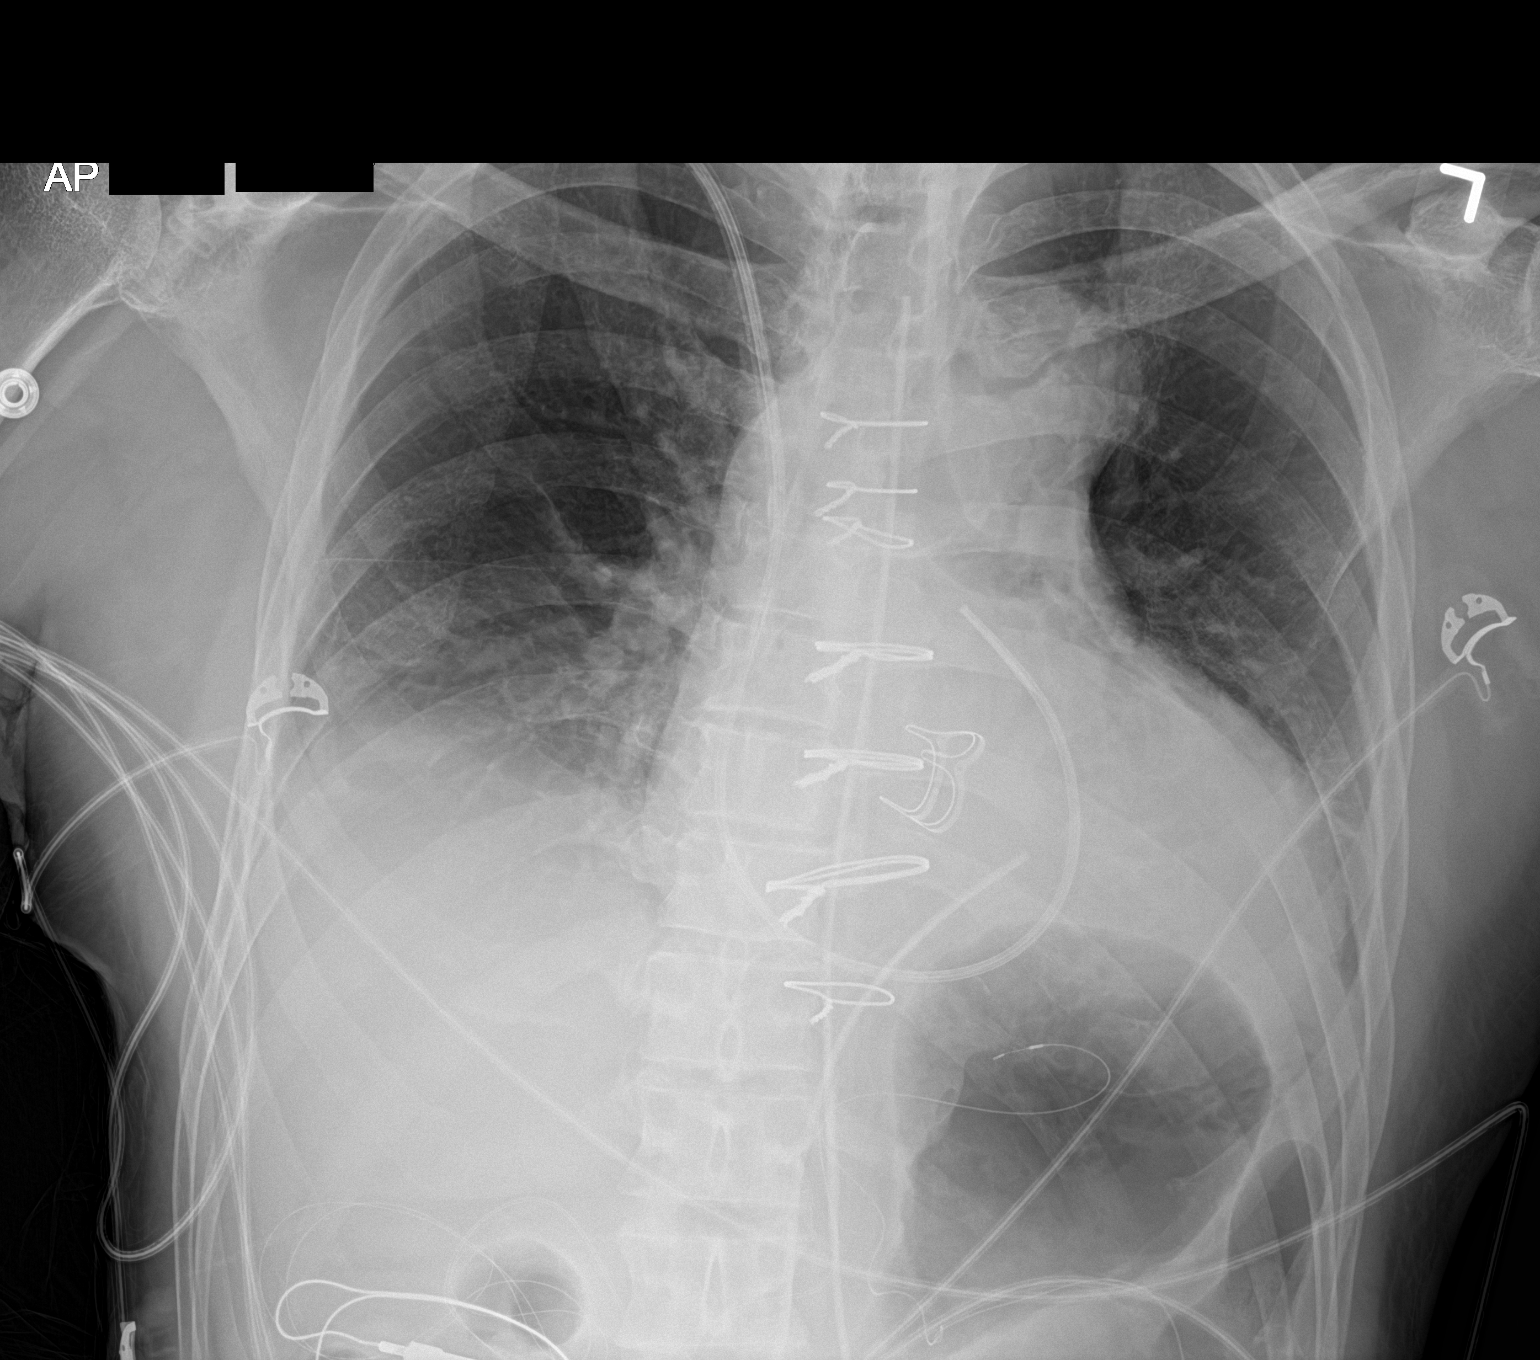

[1 of 1 positions shown; findings below may reference images not displayed]

FINDINGS: Endotracheal tube and gastric catheter have been removed in the
interval. Swan-Ganz catheter, mediastinal drain and pericardial
drain are noted and stable. No pneumothorax is seen. Mild bibasilar
atelectasis is noted increased from the prior study. No new focal
bony abnormality is noted. Changes of aortic valve replacement are
noted.
IMPRESSION: Mild bibasilar atelectasis new from the previous day.

## 2021-03-29 IMAGING — DX PORTABLE CHEST - 1 VIEW
1 series · 1 of 1 positions shown · non-contrast
Comparison: 06/10/2010

CLINICAL DATA: Follow-up cardiac surgery

EXAM:
PORTABLE CHEST 1 VIEW

[chest]
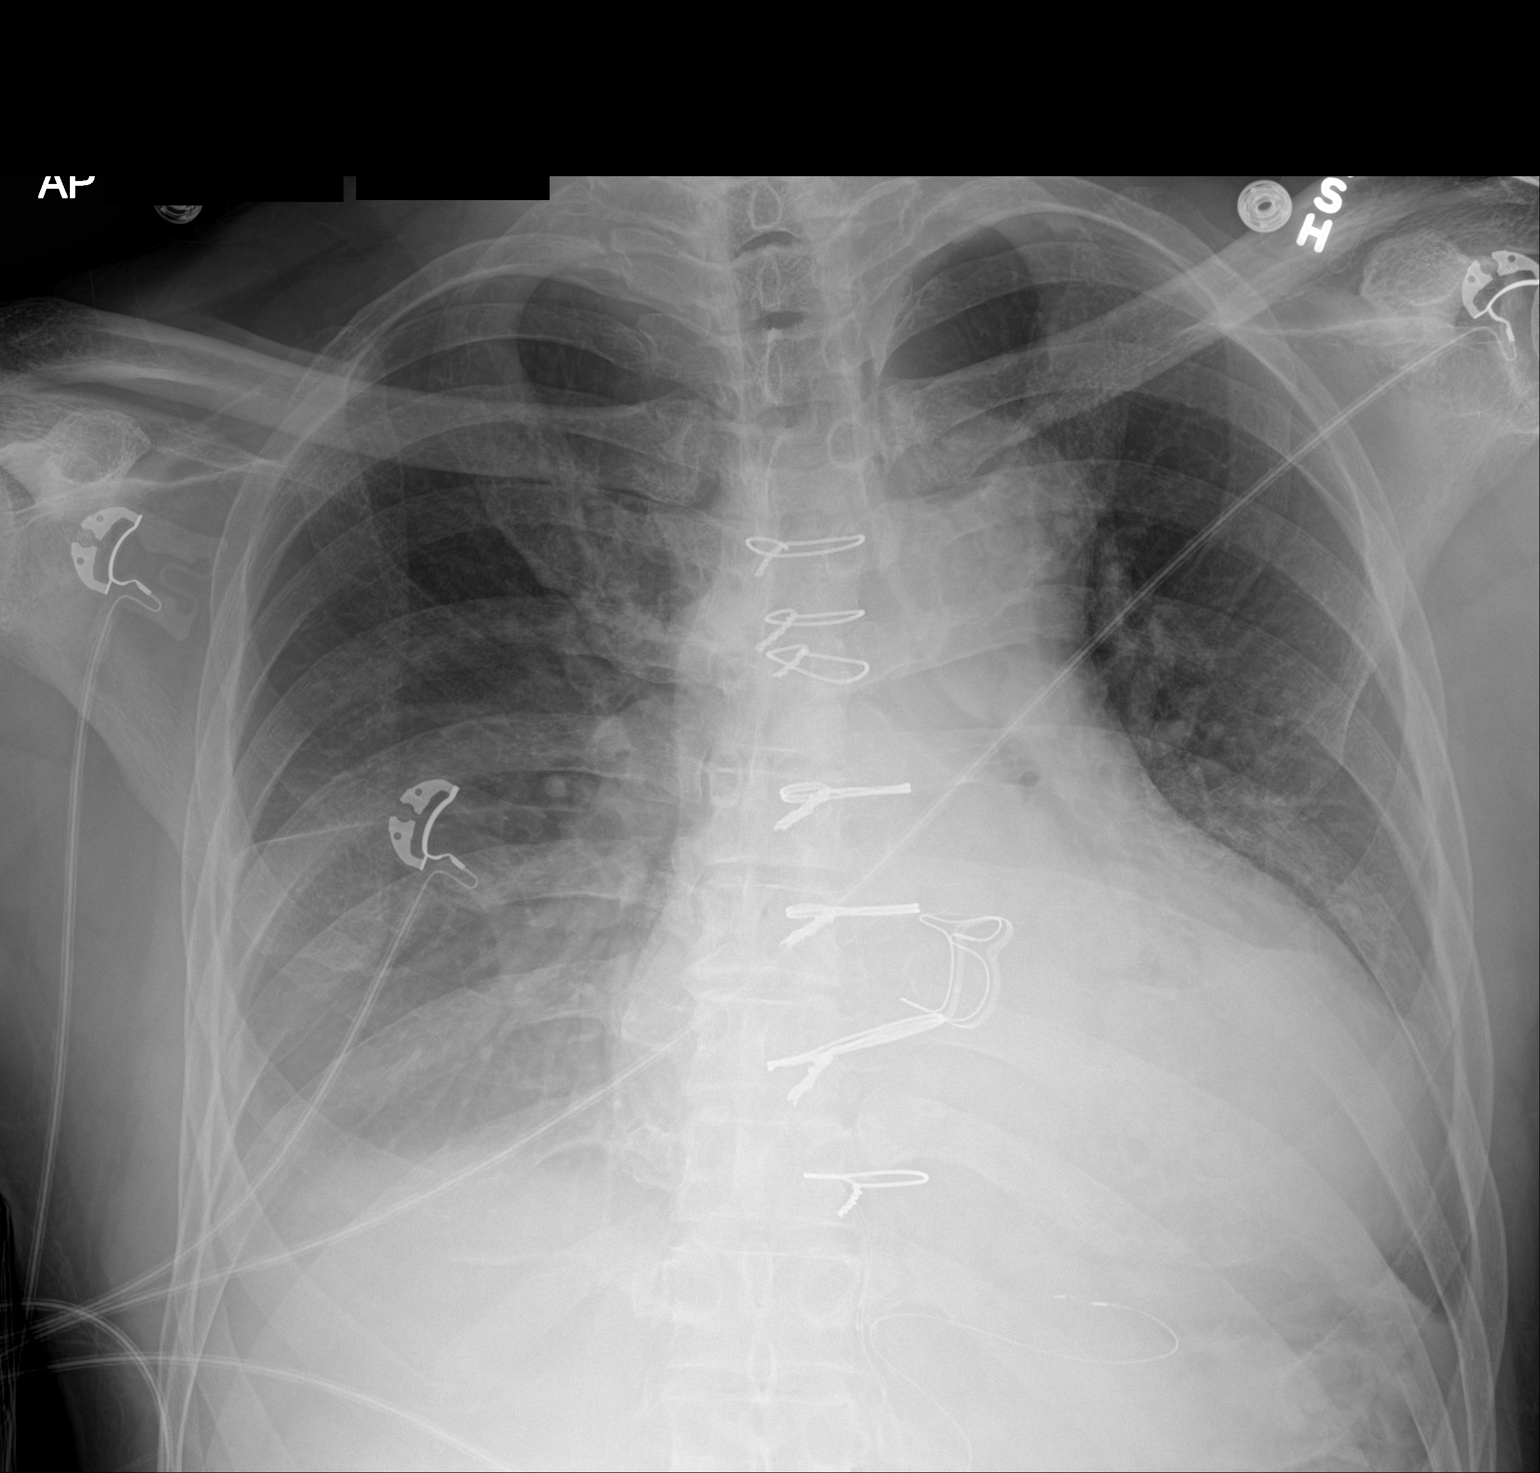

[1 of 1 positions shown; findings below may reference images not displayed]

FINDINGS: Postsurgical changes are again seen. Right jugular central line has
been removed in the interval. Small right-sided pleural effusion is
again seen. No pneumothorax is noted. Mild basilar atelectasis is
seen.
IMPRESSION: Postsurgical changes with small right effusion and bibasilar
atelectasis.

## 2021-03-30 IMAGING — DX CHEST - 2 VIEW
2 series · 2 of 2 positions shown · non-contrast
Comparison: 06/11/2018

CLINICAL DATA: Postop

EXAM:
CHEST - 2 VIEW

[chest pa]
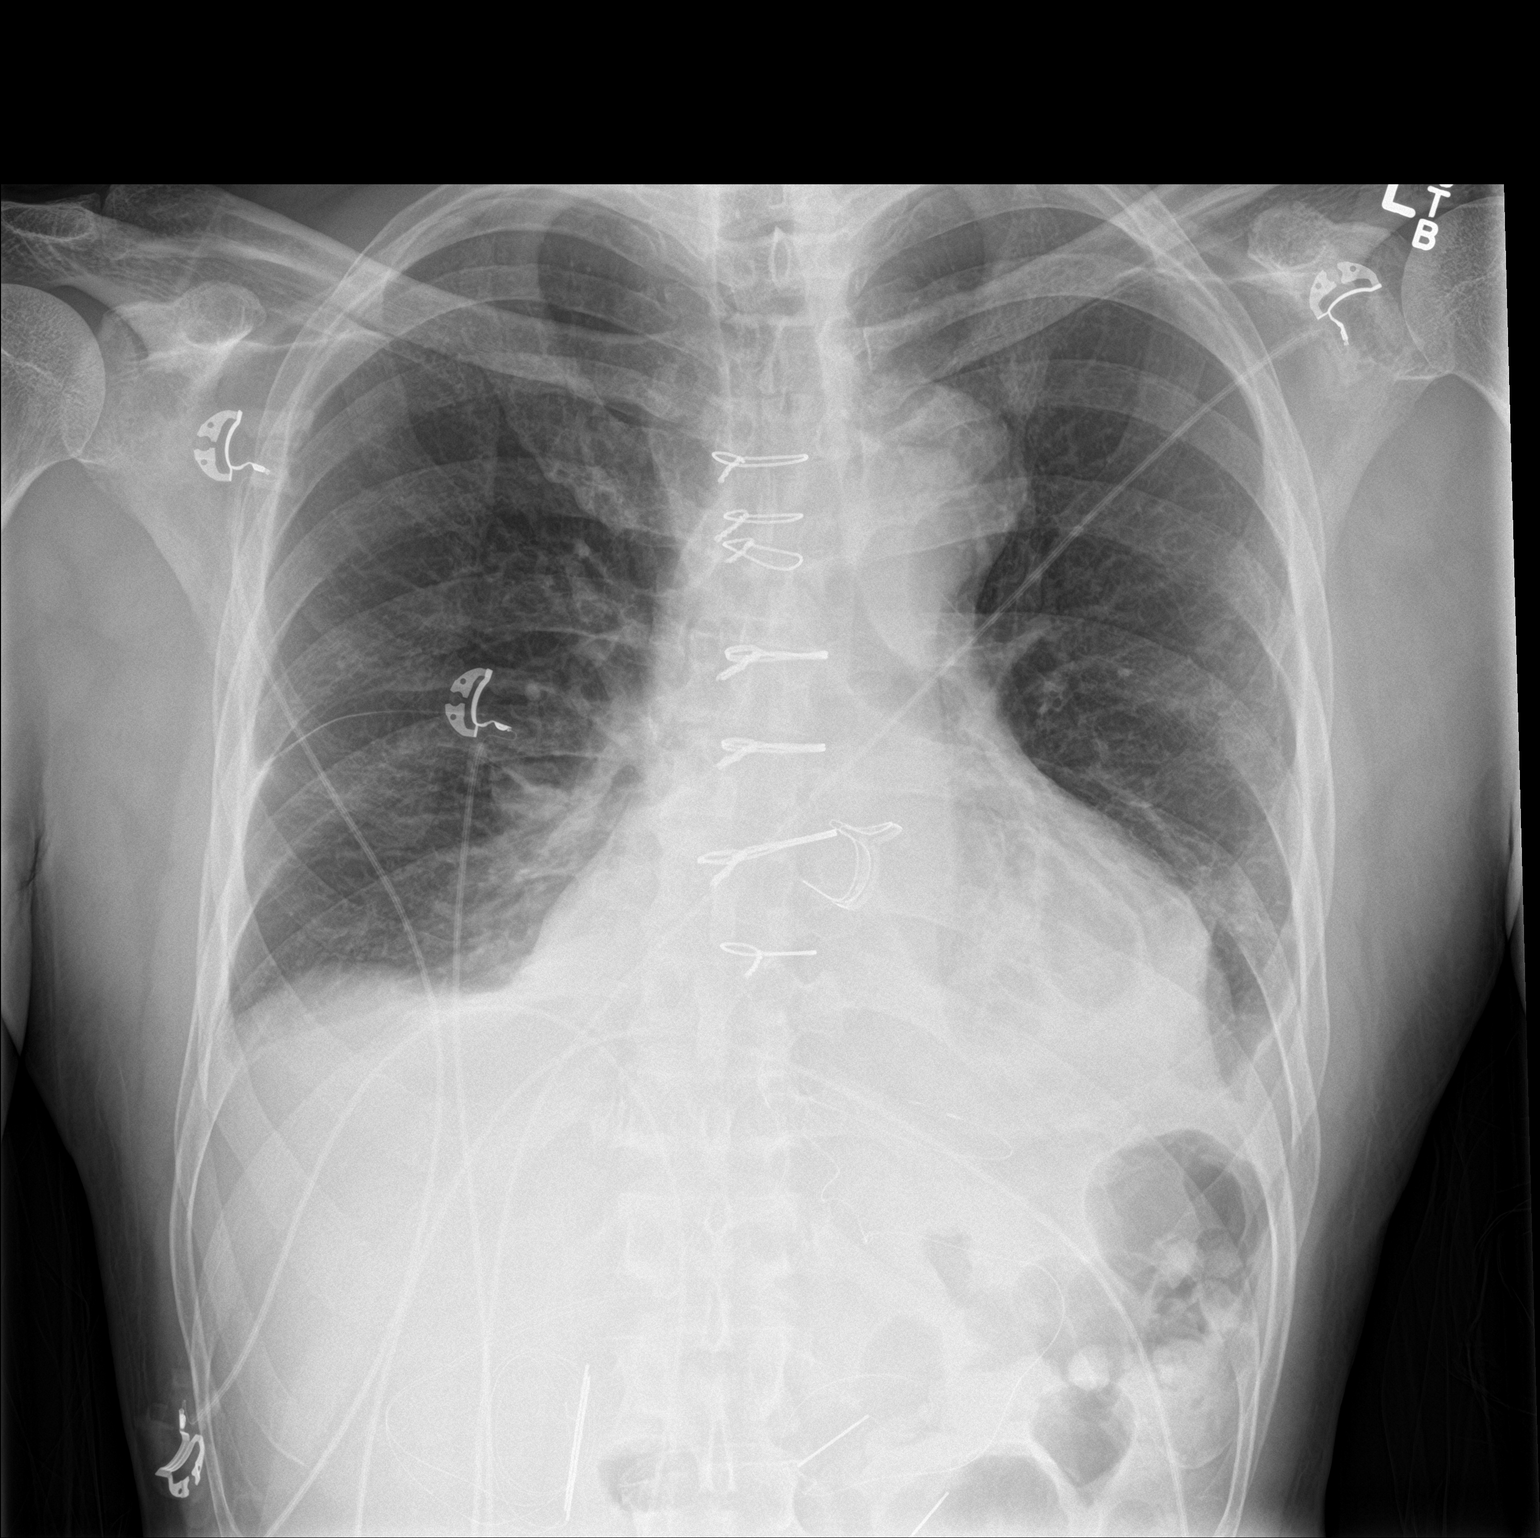

[chest lat]
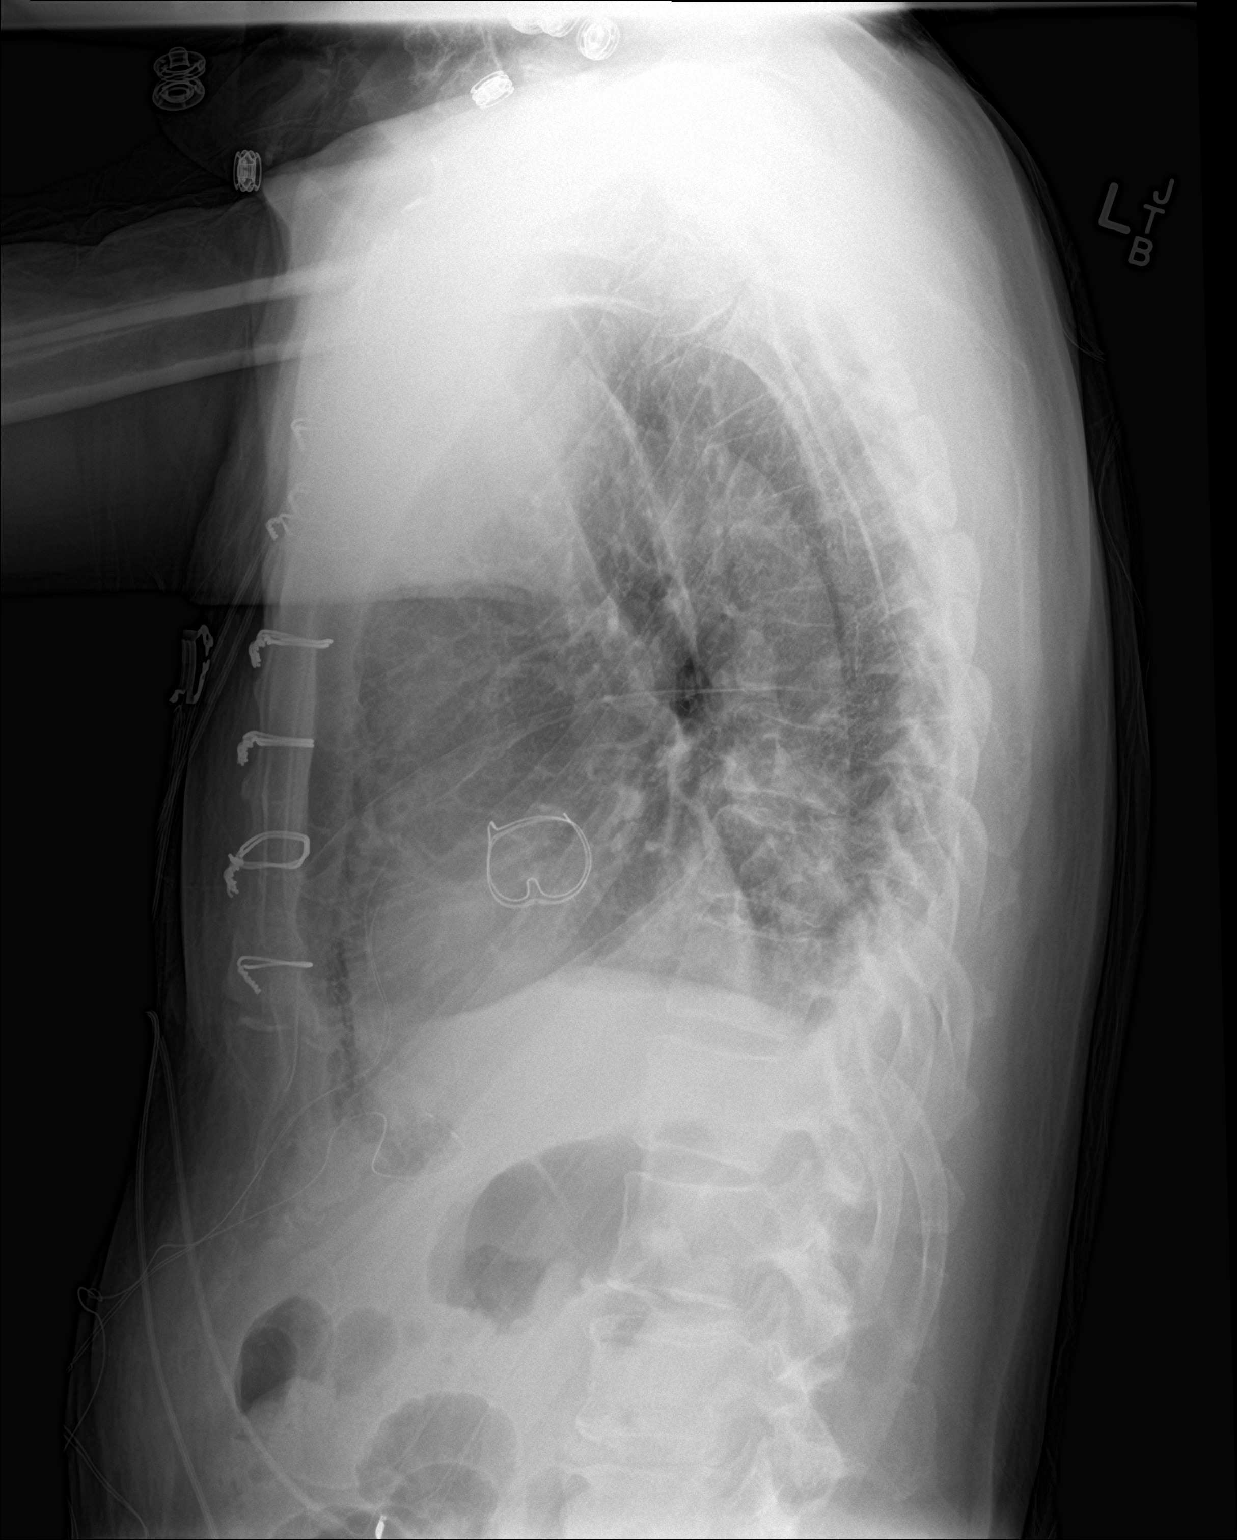

[2 of 2 positions shown; findings below may reference images not displayed]

FINDINGS: Changes of median sternotomy and valve replacement. Cardiomegaly.
Small bilateral effusions with bibasilar opacities, likely
atelectasis. Aeration in the bases improving since prior study.
IMPRESSION: Postoperative changes. Residual small effusions and bibasilar
opacities, likely atelectasis, both improving since prior study.

## 2021-04-10 DIAGNOSIS — Z95810 Presence of automatic (implantable) cardiac defibrillator: Secondary | ICD-10-CM | POA: Diagnosis not present

## 2021-04-10 DIAGNOSIS — Z952 Presence of prosthetic heart valve: Secondary | ICD-10-CM | POA: Diagnosis not present

## 2021-04-10 DIAGNOSIS — E7800 Pure hypercholesterolemia, unspecified: Secondary | ICD-10-CM | POA: Diagnosis not present

## 2021-04-10 DIAGNOSIS — N529 Male erectile dysfunction, unspecified: Secondary | ICD-10-CM | POA: Diagnosis not present

## 2021-04-10 DIAGNOSIS — I10 Essential (primary) hypertension: Secondary | ICD-10-CM | POA: Diagnosis not present

## 2021-04-24 NOTE — Progress Notes (Addendum)
? ?   ?[address] ?      [place] [address] ?            [phone]   ? ?   ?HPI: ?This is a 66 year old with a past medical history of PE, hypertension,   ?OOH cardiac arrest (s/p AICD), aortic insufficiency (s/p aortic valve replacement  ?with a pericardial tissue valve, [employer], size 25 mm), LBBB, kidney ?Stones who presents to the office today for further surveillance of an ?Ascending thoracic aortic aneurysm, last measured in 2021 at 4.3 cm. He denies chest pain, pressure or tightness. His wife states he needs to walk more and get more exercise. Patient willing to be more active;he was concerned he could do harm to aorta. ?  ?Current Outpatient Medications  ?Medication Sig Dispense Refill  ? acetaminophen (TYLENOL) 325 MG tablet Take 1-2 tablets (325-650 mg total) by mouth every 4 (four) hours as needed for mild pain.    ? amiodarone (PACERONE) 200 MG tablet TAKE 1 TABLET(200 MG) BY MOUTH DAILY 30 tablet 11  ? amLODipine (NORVASC) 2.5 MG tablet TAKE 1 TABLET(2.5 MG) BY MOUTH DAILY 30 tablet 0  ? losartan (COZAAR) 25 MG tablet TAKE 2 TABLETS(50 MG) BY MOUTH DAILY 180 tablet 3  ? metoprolol tartrate (LOPRESSOR) 25 MG tablet TAKE 1 TABLET(25 MG) BY MOUTH TWICE DAILY. Please make overdue appt with Dr. Ladona Ridgel before anymore refills. Thank you 1st attempt 60 tablet 0  ?Vital Signs: ?Vitals:  ? 04/25/21 1313  ?BP: (!) 152/108  ?Pulse: 61  ?Resp: 20  ?SpO2: 97%  ?  ? ? ?Physical Exam: ?CV-RRR, no murmur, AICD on left ?Pulmonary-Clear to auscultation bilaterally ?Abdomen-Soft, non tender, bowel sounds present ?Extremities-No LE edema ? ? ?Diagnostic Tests: ?CTA reviewed with Dr. Laneta Simmers. ATAA appears stable.  ?Addendum: ?Narrative & Impression  ?CLINICAL DATA:  Thoracic aortic aneurysm. ?  ?EXAM: ?CT ANGIOGRAPHY CHEST WITH CONTRAST ?  ?TECHNIQUE: ?Multidetector CT imaging of the chest was performed using the ?standard protocol during bolus administration of intravenous ?contrast.  Multiplanar CT image reconstructions and MIPs were ?obtained to evaluate the vascular anatomy. ?  ?CONTRAST:  75mL ISOVUE-370 IOPAMIDOL (ISOVUE-370) INJECTION 76% ?  ?COMPARISON:  April 04, 2018. ?  ?FINDINGS: ?Cardiovascular: Grossly stable 4.3 cm ascending thoracic aortic ?aneurysm is noted. Status post aortic valve replacement. No ?dissection is noted. Great vessels are widely patent without ?significant stenosis. Stable tortuosity of distal portion of ?transverse aortic arch and proximal descending thoracic aorta is ?noted. Normal cardiac size. No pericardial effusion. Left-sided ?pacemaker is unchanged. ?  ?Mediastinum/Nodes: No enlarged mediastinal, hilar, or axillary lymph ?nodes. Thyroid gland, trachea, and esophagus demonstrate no ?significant findings. ?  ?Lungs/Pleura: Lungs are clear. No pleural effusion or pneumothorax. ?  ?Upper Abdomen: No acute abnormality. ?  ?Musculoskeletal: No chest wall abnormality. No acute or significant ?osseous findings. ?  ?Review of the MIP images confirms the above findings. ?  ?IMPRESSION: ?1. Grossly stable 4.3 cm ascending thoracic aortic aneurysm. ?Recommend annual imaging followup by CTA or MRA. This recommendation ?follows 2010 ACCF/AHA/AATS/ACR/ASA/SCA/SCAI/SIR/STS/SVM Guidelines ?for the Diagnosis and Management of Patients with Thoracic Aortic ?Disease. Circulation. 2010; 121: C789-F810. Aortic aneurysm NOS ?(ICD10-I71.9). ?2. Status post aortic valve replacement. ?3. No other abnormality seen in the chest. ?  ?  ?Electronically Signed ?  By: Lupita Raider M.D. ?  On: 10/21/2019 14:23  ? ?Impression and Plan: ?We discussed the result of the scan (ATAA estimated to be 4.3 cm). I  will follow up on dictated report as well. ?We discussed several risk modifications (please see wrap up). His BP is very elevated today. Patient states he has been eating a lot of fast food. I instructed him to only eat fast food once in a great while as very hight in fat and salt. He  state he recently saw his primary Dr. [name] and BP was not that elevated. He is taking all 3 of his blood pressure medications. He was instructed on strict blood pressure control so as to not have aneurysm enlarge or rupture. We also discussed avoidance of fluoroquinolone antibiotics, use of statin (he is on Rosuvastatin ), no heavy lifting/contact sports. He will return with CTA and to see a surgeon in 1 year for further ATAA surveillance. ? ? ?Ardelle Balls, PA-C ?[facility] ?[phone] ? ? ? ? ?

## 2021-04-25 ENCOUNTER — Ambulatory Visit: Payer: Medicare (Managed Care) | Admitting: Physician Assistant

## 2021-04-25 ENCOUNTER — Ambulatory Visit
Admission: RE | Admit: 2021-04-25 | Discharge: 2021-04-25 | Disposition: A | Discharge status: Home / Self Care | Payer: Medicare (Managed Care) | Source: Ambulatory Visit | Attending: Surgery | Admitting: Surgery

## 2021-04-25 VITALS — BP 152/108 | HR 61 | Resp 20 | Ht 69.0 in | Wt 158.0 lb

## 2021-04-25 DIAGNOSIS — I7121 Aneurysm of the ascending aorta, without rupture: Secondary | ICD-10-CM

## 2021-04-25 MED ORDER — IOPAMIDOL (ISOVUE-370) INJECTION 76%
75.0000 mL | Freq: Once | INTRAVENOUS | Status: AC | PRN
  Administered 2021-04-25: 75 mL via INTRAVENOUS

## 2021-04-25 NOTE — Patient Instructions (Addendum)
He must have better control of blood pressure (prefer SBP 130/80 or less). He needs to make a follow up with cardiology and/or primary medical doctor ASAP if blood pressure continues to be high ? ?2. Avoid fluoroquinolone antibiotics (I.e Ciprofloxacin, Avelox, Levofloxacin, Ofloxacin) ? ?3.  Use of statin (to decrease cardiovascular risk). Wife states he is on Rosuvastatin ? ?4.  Exercise and activity limitations is individualized, but in general, contact sports are to be avoided and one should avoid heavy lifting (defined as half of ideal body weight) and ?exercises involving sustained Valsalva maneuver. ? ?5. Counseling for those suspected of having genetically mediated disease. First-degree ?relatives of those with TAA disease should be screened as well as those who ?have a connective tissue disease (I.e with Marfan syndrome, Ehlers-Danlos syndrome,  ?and Loeys-Dietz syndrome) or a  bicuspid aortic valve,have an increased risk for  ?complications related to TAA ? ?6. If one has tobacco abuse, smoking cessation is highly encouraged. He does not smoke. ?

## 2021-05-03 ENCOUNTER — Other Ambulatory Visit: Payer: Self-pay | Admitting: Cardiovascular Disease

## 2021-05-04 ENCOUNTER — Other Ambulatory Visit: Payer: Self-pay | Admitting: Cardiovascular Disease

## 2021-05-16 ENCOUNTER — Ambulatory Visit (INDEPENDENT_AMBULATORY_CARE_PROVIDER_SITE_OTHER): Payer: Medicare (Managed Care)

## 2021-05-16 DIAGNOSIS — I447 Left bundle-branch block, unspecified: Secondary | ICD-10-CM

## 2021-05-16 DIAGNOSIS — I4901 Ventricular fibrillation: Secondary | ICD-10-CM

## 2021-05-17 LAB — CUP PACEART REMOTE DEVICE CHECK
Date Time Interrogation Session: 20230511104430
Implantable Lead Implant Date: 20200710
Implantable Lead Implant Date: 20200710
Implantable Lead Implant Date: 20200710
Implantable Lead Location: 753858
Implantable Lead Location: 753859
Implantable Lead Location: 753860
Implantable Lead Model: 293
Implantable Lead Model: 4677
Implantable Lead Model: 7841
Implantable Lead Serial Number: 1006750
Implantable Lead Serial Number: 443715
Implantable Lead Serial Number: 814245
Implantable Pulse Generator Implant Date: 20200710
Pulse Gen Serial Number: 228146

## 2021-05-25 NOTE — Progress Notes (Signed)
Remote ICD transmission.   

## 2021-06-02 ENCOUNTER — Other Ambulatory Visit: Payer: Self-pay | Admitting: Cardiovascular Disease

## 2021-06-21 ENCOUNTER — Other Ambulatory Visit: Payer: Self-pay

## 2021-06-22 ENCOUNTER — Telehealth: Payer: Self-pay | Admitting: Internal Medicine

## 2021-06-22 NOTE — Telephone Encounter (Signed)
*  STAT* If patient is at the pharmacy, call can be transferred to refill team.   1. Which medications need to be refilled? (please list name of each medication and dose if known) amiodarone (PACERONE) 200 MG tablet  2. Which pharmacy/location (including street and city if local pharmacy) is medication to be sent to? [id number] - [place], [place] - [address] AT [facility] OF [address]  3. Do they need a 30 day or 90 day supply? 30 day  Patient is out of medication.

## 2021-06-25 ENCOUNTER — Other Ambulatory Visit: Payer: Self-pay

## 2021-06-28 ENCOUNTER — Encounter: Payer: Self-pay | Admitting: Internal Medicine

## 2021-06-28 ENCOUNTER — Ambulatory Visit: Payer: Medicare (Managed Care) | Admitting: Internal Medicine

## 2021-06-28 VITALS — BP 120/80 | HR 60 | Ht 69.0 in | Wt 159.2 lb

## 2021-06-28 DIAGNOSIS — R1013 Epigastric pain: Secondary | ICD-10-CM

## 2021-06-28 DIAGNOSIS — Z95810 Presence of automatic (implantable) cardiac defibrillator: Secondary | ICD-10-CM

## 2021-06-28 MED ORDER — AMLODIPINE BESYLATE 2.5 MG PO TABS: ORAL_TABLET | ORAL | 3 refills | Status: DC

## 2021-06-28 MED ORDER — METOPROLOL TARTRATE 25 MG PO TABS: ORAL_TABLET | ORAL | 3 refills | Status: AC

## 2021-06-28 MED ORDER — AMIODARONE HCL 200 MG PO TABS: ORAL_TABLET | ORAL | 3 refills | Status: DC

## 2021-06-28 MED ORDER — LOSARTAN POTASSIUM 25 MG PO TABS: ORAL_TABLET | ORAL | 3 refills | Status: AC

## 2021-06-28 NOTE — Progress Notes (Signed)
HPI Mr. Nakanishi returns today after a long absence from our device clinic. He is a pleasant 66 yo man with a h/o VF arrest and AI, s/p AVR. He has done well in the interim. He has been on amiodarone since 2020. He denies chest pain or sob. No syncope. He denies peripheral edema. He is s/p Biv ICD insertion and appears to be doing well. No chest pain. He has been found to have a CT of the chest demonstrating a 4.4 cm aneurysm.  He denies ICD therapies.  No Known Allergies   Current Outpatient Medications  Medication Sig Dispense Refill   acetaminophen (TYLENOL) 325 MG tablet Take 1-2 tablets (325-650 mg total) by mouth every 4 (four) hours as needed for mild pain.     amiodarone (PACERONE) 200 MG tablet TAKE 1 TABLET(200 MG) BY MOUTH DAILY 15 tablet 0   amLODipine (NORVASC) 2.5 MG tablet TAKE 1 TABLET(2.5 MG) BY MOUTH DAILY 30 tablet 0   losartan (COZAAR) 25 MG tablet Take 25 mg by mouth daily.     metoprolol tartrate (LOPRESSOR) 25 MG tablet TAKE 1 TABLET(25 MG) BY MOUTH TWICE DAILY. Please make overdue appt with Dr. Ladona Ridgel before anymore refills. Thank you 1st attempt 60 tablet 0   No current facility-administered medications for this visit.     Past Medical History:  Diagnosis Date   Abnormal CXR 03/24/2018   Aortic insufficiency    Cardiac arrest (HCC) 03/24/2018   Cognitive disorder    Constipation 04/07/2018   Debility 04/05/2018   Dyspepsia --felt to be due to dysmotility 04/10/2018   Dysrhythmia    Elevated troponin 03/24/2018   Endotracheally intubated 03/24/2018   History of implantable cardioverter-defibrillator (ICD) placement 07/18/2018   History of kidney stones    Hypokalemia 03/24/2018   LBBB (left bundle branch block) 03/24/2018   Pneumonia    history of   Pulmonary embolism (HCC)    04/04/18   S/P AVR (aortic valve replacement)    Echocardiogram 07/03/2018: EF 45-50, septal-lateral dyssynchrony, mild LVH, grade 2 diastolic dysfunction, normal RVSF, mild MR,  bioprosthetic AoV with no regurgitation and mildly elevated mean gradient at 18, mild dilation of the ascending aorta (42), PASP 21   Thoracic aortic aneurysm (HCC) 06/24/2018   CT 03/2018:  Ao root 4.2 cm; ascending Aorta 4.3 cm   Ventricular fibrillation (HCC) 03/24/2018    ROS:   All systems reviewed and negative except as noted in the HPI.   Past Surgical History:  Procedure Laterality Date   AORTIC ARCH ANGIOGRAPHY N/A 04/02/2018   Procedure: AORTIC ARCH ANGIOGRAPHY;  Surgeon: Lennette Bihari, MD;  Location: [facility] INVASIVE CV LAB;  Service: Cardiovascular;  Laterality: N/A;   AORTIC VALVE REPLACEMENT N/A 06/08/2018   Procedure: AORTIC VALVE REPLACEMENT (AVR), ON PUMP, USING MAGNA EASE AORTIC BIOPROSTHESIS VALVE ;  Surgeon: Delight Ovens, MD;  Location: [facility] OR;  Service: Open Heart Surgery;  Laterality: N/A;   BIV ICD INSERTION CRT-D N/A 07/17/2018   Procedure: BIV ICD INSERTION CRT-D;  Surgeon: Marinus Maw, MD;  Location: [facility] INVASIVE CV LAB;  Service: Cardiovascular;  Laterality: N/A;   PACEMAKER INSERTION     RIGHT/LEFT HEART CATH AND CORONARY ANGIOGRAPHY N/A 04/02/2018   Procedure: RIGHT/LEFT HEART CATH AND CORONARY ANGIOGRAPHY;  Surgeon: Lennette Bihari, MD;  Location: [facility] INVASIVE CV LAB;  Service: Cardiovascular;  Laterality: N/A;   TEE WITHOUT CARDIOVERSION N/A 06/08/2018   Procedure: TRANSESOPHAGEAL ECHOCARDIOGRAM (TEE);  Surgeon: Delight Ovens,  MD;  Location: [facility] OR;  Service: Open Heart Surgery;  Laterality: N/A;     Family History  Problem Relation Age of Onset   Pulmonary embolism Mother    Prostate cancer Father        passed awary at 6     Social History   Socioeconomic History   Marital status: Married    Spouse name: Not on file   Number of children: Not on file   Years of education: Not on file   Highest education level: Not on file  Occupational History   Not on file  Tobacco Use   Smoking status: Never   Smokeless tobacco: Never  Substance and  Sexual Activity   Alcohol use: Never   Drug use: Never   Sexual activity: Not on file  Other Topics Concern   Not on file  Social History Narrative   Not on file   Social Determinants of Health   Financial Resource Strain: Not on file  Food Insecurity: Not on file  Transportation Needs: Not on file  Physical Activity: Not on file  Stress: Not on file  Social Connections: Not on file  Intimate Partner Violence: Not on file     BP 120/80   Pulse 60   Ht 5\' 9"  (1.753 m)   Wt 159 lb 3.2 oz (72.2 kg)   BMI 23.51 kg/m   Physical Exam:  Well appearing NAD HEENT: Unremarkable Neck:  No JVD, no thyromegally Lymphatics:  No adenopathy Back:  No CVA tenderness Lungs:  Clear with no wheezes HEART:  Regular rate rhythm, no murmurs, no rubs, no clicks Abd:  soft, positive bowel sounds, no organomegally, no rebound, no guarding Ext:  2 plus pulses, no edema, no cyanosis, no clubbing Skin:  No rashes no nodules Neuro:  CN II through XII intact, motor grossly intact  EKG - nsr with biv pacing  DEVICE  Normal device function.  See PaceArt for details.   Assess/Plan:  VF arrest - interrogation of his ICD demonstrates normal Biv ICD function. He has had no recurrent VF episodes. We will have him reduce his dose of amiodarone to 200 mg daily, none on Sat/Sun.  ICD - his ICD is working normally. He has about 9.5 years of battery. AI - he is s/p aortic valve replacement. He has no murmur on exam today.  HTN - his bp is well controlled.   [employer] Gerard Bonus,MD

## 2021-06-28 NOTE — Patient Instructions (Addendum)
Medication Instructions:  Your physician has recommended you make the following change in your medication:  Amiodarone 200 MG-  Take one tablet by mouth once daily, Monday through Friday.  DO NOT TAKE any Amiodarone on Saturday or Sunday.    Labwork: None ordered.  Testing/Procedures: None ordered.  Follow-Up: Your physician wants you to follow-up in: one year with Lewayne Bunting, MD or one of the following Advanced Practice Providers on your designated Care Team:   Francis Dowse, [place] Romelle "Mardelle Matte" Lanna Poche, [place]  Remote monitoring is used to monitor your ICD from home. This monitoring reduces the number of office visits required to check your device to one time per year. It allows [place] to keep an eye on the functioning of your device to ensure it is working properly. You are scheduled for a device check from home on 08/15/21. You may send your transmission at any time that day. If you have a wireless device, the transmission will be sent automatically. After your physician reviews your transmission, you will receive a postcard with your next transmission date.  Any Other Special Instructions Will Be Listed Below (If Applicable).  If you need a refill on your cardiac medications before your next appointment, please call your pharmacy.   Important Information About Sugar

## 2021-08-15 ENCOUNTER — Ambulatory Visit (INDEPENDENT_AMBULATORY_CARE_PROVIDER_SITE_OTHER): Payer: Medicare (Managed Care)

## 2021-08-15 DIAGNOSIS — I428 Other cardiomyopathies: Secondary | ICD-10-CM | POA: Diagnosis not present

## 2021-08-16 LAB — CUP PACEART REMOTE DEVICE CHECK
Battery Remaining Longevity: 108 mo
Battery Remaining Percentage: 100 %
Brady Statistic RA Percent Paced: 100 %
Brady Statistic RV Percent Paced: 10 %
Date Time Interrogation Session: 20230809021400
HighPow Impedance: 64 Ohm
Implantable Lead Implant Date: 20200710
Implantable Lead Implant Date: 20200710
Implantable Lead Implant Date: 20200710
Implantable Lead Location: 753858
Implantable Lead Location: 753859
Implantable Lead Location: 753860
Implantable Lead Model: 293
Implantable Lead Model: 4677
Implantable Lead Model: 7841
Implantable Lead Serial Number: 1006750
Implantable Lead Serial Number: 443715
Implantable Lead Serial Number: 814245
Implantable Pulse Generator Implant Date: 20200710
Lead Channel Impedance Value: 469 Ohm
Lead Channel Impedance Value: 504 Ohm
Lead Channel Impedance Value: 686 Ohm
Lead Channel Setting Pacing Amplitude: 2 V
Lead Channel Setting Pacing Amplitude: 2.5 V
Lead Channel Setting Pacing Amplitude: 2.5 V
Lead Channel Setting Pacing Pulse Width: 0.4 ms
Lead Channel Setting Pacing Pulse Width: 0.4 ms
Lead Channel Setting Sensing Sensitivity: 0.5 mV
Lead Channel Setting Sensing Sensitivity: 1 mV
Pulse Gen Serial Number: 228146

## 2021-09-06 DIAGNOSIS — H40003 Preglaucoma, unspecified, bilateral: Secondary | ICD-10-CM | POA: Diagnosis not present

## 2021-09-06 DIAGNOSIS — H15121 Nodular episcleritis, right eye: Secondary | ICD-10-CM | POA: Diagnosis not present

## 2021-09-18 NOTE — Progress Notes (Signed)
Remote ICD transmission.   

## 2021-11-01 DIAGNOSIS — L729 Follicular cyst of the skin and subcutaneous tissue, unspecified: Secondary | ICD-10-CM | POA: Diagnosis not present

## 2021-11-01 DIAGNOSIS — I447 Left bundle-branch block, unspecified: Secondary | ICD-10-CM | POA: Diagnosis not present

## 2021-11-01 DIAGNOSIS — Z1331 Encounter for screening for depression: Secondary | ICD-10-CM | POA: Diagnosis not present

## 2021-11-01 DIAGNOSIS — E7800 Pure hypercholesterolemia, unspecified: Secondary | ICD-10-CM | POA: Diagnosis not present

## 2021-11-01 DIAGNOSIS — Z125 Encounter for screening for malignant neoplasm of prostate: Secondary | ICD-10-CM | POA: Diagnosis not present

## 2021-11-01 DIAGNOSIS — Z0000 Encounter for general adult medical examination without abnormal findings: Secondary | ICD-10-CM | POA: Diagnosis not present

## 2021-11-01 DIAGNOSIS — Z23 Encounter for immunization: Secondary | ICD-10-CM | POA: Diagnosis not present

## 2021-11-01 DIAGNOSIS — Z1159 Encounter for screening for other viral diseases: Secondary | ICD-10-CM | POA: Diagnosis not present

## 2021-11-01 DIAGNOSIS — I10 Essential (primary) hypertension: Secondary | ICD-10-CM | POA: Diagnosis not present

## 2021-11-14 ENCOUNTER — Ambulatory Visit (INDEPENDENT_AMBULATORY_CARE_PROVIDER_SITE_OTHER): Payer: Medicare (Managed Care)

## 2021-11-14 DIAGNOSIS — I428 Other cardiomyopathies: Secondary | ICD-10-CM

## 2021-11-14 LAB — CUP PACEART REMOTE DEVICE CHECK
Battery Remaining Longevity: 108 mo
Battery Remaining Percentage: 100 %
Brady Statistic RA Percent Paced: 100 %
Brady Statistic RV Percent Paced: 10 %
Date Time Interrogation Session: 20231108030600
HighPow Impedance: 68 Ohm
Implantable Lead Connection Status: 753985
Implantable Lead Connection Status: 753985
Implantable Lead Connection Status: 753985
Implantable Lead Implant Date: 20200710
Implantable Lead Implant Date: 20200710
Implantable Lead Implant Date: 20200710
Implantable Lead Location: 753858
Implantable Lead Location: 753859
Implantable Lead Location: 753860
Implantable Lead Model: 293
Implantable Lead Model: 4677
Implantable Lead Model: 7841
Implantable Lead Serial Number: 1006750
Implantable Lead Serial Number: 443715
Implantable Lead Serial Number: 814245
Implantable Pulse Generator Implant Date: 20200710
Lead Channel Impedance Value: 473 Ohm
Lead Channel Impedance Value: 502 Ohm
Lead Channel Impedance Value: 718 Ohm
Lead Channel Setting Pacing Amplitude: 2 V
Lead Channel Setting Pacing Amplitude: 2.5 V
Lead Channel Setting Pacing Amplitude: 2.5 V
Lead Channel Setting Pacing Pulse Width: 0.4 ms
Lead Channel Setting Pacing Pulse Width: 0.4 ms
Lead Channel Setting Sensing Sensitivity: 0.5 mV
Lead Channel Setting Sensing Sensitivity: 1 mV
Pulse Gen Serial Number: 228146
Zone Setting Status: 755011

## 2021-11-26 DIAGNOSIS — H2513 Age-related nuclear cataract, bilateral: Secondary | ICD-10-CM | POA: Diagnosis not present

## 2021-11-26 DIAGNOSIS — H40003 Preglaucoma, unspecified, bilateral: Secondary | ICD-10-CM | POA: Diagnosis not present

## 2022-02-13 ENCOUNTER — Ambulatory Visit (INDEPENDENT_AMBULATORY_CARE_PROVIDER_SITE_OTHER): Payer: 59

## 2022-02-13 DIAGNOSIS — I428 Other cardiomyopathies: Secondary | ICD-10-CM

## 2022-02-13 LAB — CUP PACEART REMOTE DEVICE CHECK
Battery Remaining Longevity: 102 mo
Battery Remaining Percentage: 100 %
Brady Statistic RA Percent Paced: 100 %
Brady Statistic RV Percent Paced: 9 %
Date Time Interrogation Session: 20240207020100
HighPow Impedance: 71 Ohm
Implantable Lead Connection Status: 753985
Implantable Lead Connection Status: 753985
Implantable Lead Connection Status: 753985
Implantable Lead Implant Date: 20200710
Implantable Lead Implant Date: 20200710
Implantable Lead Implant Date: 20200710
Implantable Lead Location: 753858
Implantable Lead Location: 753859
Implantable Lead Location: 753860
Implantable Lead Model: 293
Implantable Lead Model: 4677
Implantable Lead Model: 7841
Implantable Lead Serial Number: 1006750
Implantable Lead Serial Number: 443715
Implantable Lead Serial Number: 814245
Implantable Pulse Generator Implant Date: 20200710
Lead Channel Impedance Value: 480 Ohm
Lead Channel Impedance Value: 504 Ohm
Lead Channel Impedance Value: 699 Ohm
Lead Channel Setting Pacing Amplitude: 2 V
Lead Channel Setting Pacing Amplitude: 2.5 V
Lead Channel Setting Pacing Amplitude: 2.5 V
Lead Channel Setting Pacing Pulse Width: 0.4 ms
Lead Channel Setting Pacing Pulse Width: 0.4 ms
Lead Channel Setting Sensing Sensitivity: 0.5 mV
Lead Channel Setting Sensing Sensitivity: 1 mV
Pulse Gen Serial Number: 228146
Zone Setting Status: 755011

## 2022-03-12 NOTE — Progress Notes (Signed)
Remote ICD transmission.   

## 2022-03-18 ENCOUNTER — Other Ambulatory Visit: Payer: Self-pay | Admitting: Surgery

## 2022-03-18 DIAGNOSIS — I7121 Aneurysm of the ascending aorta, without rupture: Secondary | ICD-10-CM

## 2022-03-28 ENCOUNTER — Other Ambulatory Visit: Payer: Self-pay | Admitting: Family Medicine

## 2022-03-28 DIAGNOSIS — R109 Unspecified abdominal pain: Secondary | ICD-10-CM | POA: Diagnosis not present

## 2022-03-28 DIAGNOSIS — R110 Nausea: Secondary | ICD-10-CM | POA: Diagnosis not present

## 2022-03-28 DIAGNOSIS — R1010 Upper abdominal pain, unspecified: Secondary | ICD-10-CM

## 2022-04-18 ENCOUNTER — Ambulatory Visit
Admission: RE | Admit: 2022-04-18 | Discharge: 2022-04-18 | Disposition: A | Discharge status: Home / Self Care | Payer: Medicare (Managed Care) | Source: Ambulatory Visit | Attending: Family Medicine | Admitting: Family Medicine

## 2022-04-18 DIAGNOSIS — R1010 Upper abdominal pain, unspecified: Secondary | ICD-10-CM

## 2022-04-18 DIAGNOSIS — N281 Cyst of kidney, acquired: Secondary | ICD-10-CM | POA: Diagnosis not present

## 2022-05-01 ENCOUNTER — Other Ambulatory Visit: Payer: Medicare (Managed Care)

## 2022-05-01 ENCOUNTER — Ambulatory Visit: Payer: Medicare (Managed Care) | Admitting: Surgery

## 2022-05-08 ENCOUNTER — Ambulatory Visit: Payer: Medicare (Managed Care) | Admitting: Surgery

## 2022-05-15 ENCOUNTER — Ambulatory Visit (INDEPENDENT_AMBULATORY_CARE_PROVIDER_SITE_OTHER): Payer: Medicare (Managed Care)

## 2022-05-15 DIAGNOSIS — I428 Other cardiomyopathies: Secondary | ICD-10-CM

## 2022-05-15 LAB — CUP PACEART REMOTE DEVICE CHECK
Battery Remaining Longevity: 102 mo
Battery Remaining Percentage: 100 %
Brady Statistic RA Percent Paced: 100 %
Brady Statistic RV Percent Paced: 11 %
Date Time Interrogation Session: 20240508091000
HighPow Impedance: 71 Ohm
Implantable Lead Connection Status: 753985
Implantable Lead Connection Status: 753985
Implantable Lead Connection Status: 753985
Implantable Lead Implant Date: 20200710
Implantable Lead Implant Date: 20200710
Implantable Lead Implant Date: 20200710
Implantable Lead Location: 753858
Implantable Lead Location: 753859
Implantable Lead Location: 753860
Implantable Lead Model: 293
Implantable Lead Model: 4677
Implantable Lead Model: 7841
Implantable Lead Serial Number: 1006750
Implantable Lead Serial Number: 443715
Implantable Lead Serial Number: 814245
Implantable Pulse Generator Implant Date: 20200710
Lead Channel Impedance Value: 456 Ohm
Lead Channel Impedance Value: 493 Ohm
Lead Channel Impedance Value: 678 Ohm
Lead Channel Setting Pacing Amplitude: 2 V
Lead Channel Setting Pacing Amplitude: 2.5 V
Lead Channel Setting Pacing Amplitude: 2.5 V
Lead Channel Setting Pacing Pulse Width: 0.4 ms
Lead Channel Setting Pacing Pulse Width: 0.4 ms
Lead Channel Setting Sensing Sensitivity: 0.5 mV
Lead Channel Setting Sensing Sensitivity: 1 mV
Pulse Gen Serial Number: 228146
Zone Setting Status: 755011

## 2022-05-21 ENCOUNTER — Encounter: Payer: Self-pay | Admitting: Surgery

## 2022-05-22 ENCOUNTER — Ambulatory Visit
Admission: RE | Admit: 2022-05-22 | Discharge: 2022-05-22 | Disposition: A | Discharge status: Home / Self Care | Payer: Medicare (Managed Care) | Source: Ambulatory Visit | Attending: Surgery | Admitting: Surgery

## 2022-05-22 ENCOUNTER — Encounter: Payer: Self-pay | Admitting: Surgery

## 2022-05-22 ENCOUNTER — Ambulatory Visit: Payer: Medicare (Managed Care) | Admitting: Surgery

## 2022-05-22 VITALS — BP 122/82 | HR 60 | Resp 18 | Ht 69.0 in | Wt 162.0 lb

## 2022-05-22 DIAGNOSIS — I2782 Chronic pulmonary embolism: Secondary | ICD-10-CM | POA: Diagnosis not present

## 2022-05-22 DIAGNOSIS — I7121 Aneurysm of the ascending aorta, without rupture: Secondary | ICD-10-CM | POA: Diagnosis not present

## 2022-05-22 MED ORDER — IOPAMIDOL (ISOVUE-370) INJECTION 76%
75.0000 mL | Freq: Once | INTRAVENOUS | Status: AC | PRN
  Administered 2022-05-22: 75 mL via INTRAVENOUS

## 2022-05-22 NOTE — Progress Notes (Signed)
HPI:  The patient is a 67 year old gentleman with a history of hypertension, remote aortic valve replacement with a 25 mm Edwards pericardial valve by Dr. Tyrone Sage in 2020 for severe aortic insufficiency and pulmonary embolism.  He originally presented with an out of hospital cardiac arrest and was resuscitated with CPR by his wife who has a mechanical aortic valve in place.  He recovered and ultimately had an AICD placed.  He was last seen in our office on 04/25/2021 by one of our PAs for follow-up of his small ascending aortic aneurysm measured at 4.3 cm.  This has been stable.  Since his last visit here he said that he has been feeling well overall.  He denies any chest pain or shortness of breath.  Current Outpatient Medications  Medication Sig Dispense Refill   acetaminophen (TYLENOL) 325 MG tablet Take 1-2 tablets (325-650 mg total) by mouth every 4 (four) hours as needed for mild pain.     amiodarone (PACERONE) 200 MG tablet Take one tablet by mouth daily Monday through Friday.  DO NOT TAKE any tablets on Saturday or Sunday. 90 tablet 3   amLODipine (NORVASC) 2.5 MG tablet TAKE 1 TABLET(2.5 MG) BY MOUTH DAILY 90 tablet 3   losartan (COZAAR) 25 MG tablet TAKE 2 TABLETS(50 MG) BY MOUTH DAILY (Patient taking differently: TAKE 1 TABLETS BY MOUTH DAILY) 180 tablet 3   metoprolol tartrate (LOPRESSOR) 25 MG tablet TAKE 1 TABLET(25 MG) BY MOUTH TWICE DAILY 180 tablet 3   No current facility-administered medications for this visit.     Physical Exam: BP 122/82 (BP Location: Right Arm, Patient Position: Sitting)   Pulse 60   Resp 18   Ht 5\' 9"  (1.753 m)   Wt 162 lb (73.5 kg)   SpO2 99% Comment: RA  BMI 23.92 kg/m  He looks well. Cardiac exam shows a regular rate and rhythm with normal heart sounds.  There is no murmur. Lungs are clear. There is no peripheral edema.  Diagnostic Tests:  Narrative & Impression  CLINICAL DATA:  Follow-up thoracic aortic aneurysm.   EXAM: CT  ANGIOGRAPHY CHEST WITH CONTRAST   TECHNIQUE: Multidetector CT imaging of the chest was performed using the standard protocol during bolus administration of intravenous contrast. Multiplanar CT image reconstructions and MIPs were obtained to evaluate the vascular anatomy.   RADIATION DOSE REDUCTION: This exam was performed according to the departmental dose-optimization program which includes automated exposure control, adjustment of the mA and/or kV according to patient size and/or use of iterative reconstruction technique.   CONTRAST:  75mL ISOVUE-370 IOPAMIDOL (ISOVUE-370) INJECTION 76%   COMPARISON:  04/25/2021   FINDINGS: Cardiovascular: The ascending thoracic aorta measures 4.4 cm on today's exam, image 52/9. This is compared with 4.4 cm previously. Status post aortic valve replacement. No signs of thoracic aortic dissection. No significant aortic atherosclerotic calcifications. No coronary artery calcifications identified. There is a left chest wall ICD with leads in the right atrium, coronary sinus and right ventricle.   Incidental pulmonary embolus identified within segmental and subsegmental branches of the right upper lobe pulmonary artery, image 60/4, image 54/4 an image 59/4. There is also a small filling defect within segmental and subsegmental branches of the left lower lobe pulmonary artery, image 74/4.   Mediastinum/Nodes: No enlarged mediastinal, hilar, or axillary lymph nodes. Thyroid gland, trachea, and esophagus demonstrate no significant findings.   Lungs/Pleura: No pleural effusion. No airspace consolidation, atelectasis or pneumothorax.   Upper Abdomen: No acute abnormality. No acute  abnormality. Right kidney cyst measures 4.9 cm, image 151/4. No follow-up imaging recommended.   Musculoskeletal: No chest wall abnormality. No acute or significant osseous findings.   Review of the MIP images confirms the above findings.   IMPRESSION: 1. Stable  aneurysmal dilatation of the ascending thoracic aorta measuring 4.4 cm. Recommend annual imaging followup by CTA or MRA. This recommendation follows 2010 ACCF/AHA/AATS/ACR/ASA/SCA/SCAI/SIR/STS/SVM Guidelines for the Diagnosis and Management of Patients with Thoracic Aortic Disease. Circulation. 2010; 121: Z610-R604. Aortic aneurysm NOS (ICD10-I71.9) 2. Incidental finding of acute pulmonary emboli within segmental and subsegmental branches of the right upper lobe and left lower lobe pulmonary arteries.   Critical Value/emergent results were called by telephone at the time of interpretation on 05/22/2022 at 12:26 pm to provider's Nurse Eulah Pont, who verbally acknowledged these results.     Electronically Signed   By: Signa Kell M.D.   On: 05/22/2022 12:31    Impression:  CTA of the chest today shows a stable 4.4 cm fusiform ascending aortic aneurysm.  The scan also showed an incidental finding of acute pulmonary emboli within the segmental and subsegmental branches of the right upper lobe and left lower lobe pulmonary arteries that were not present on the scan 1 year ago.  He does have a history of prior PE in 2020 involving the right middle and lower lobes and reports being on anticoagulation for a while.  I reviewed the CTA images with the patient and his wife and answered their questions.  I asked him to contact his primary physician's office concerning the need for anticoagulation.  With his previous history of pulmonary embolism and new incidental pulmonary emboli he may need to be on chronic anticoagulation.  He said that his mother died of pulmonary embolism.  It is certainly possible that he has some underlying coagulation disorder leading to this.  His aortic aneurysm is well below the surgical threshold of 5.5 cm.  This will require yearly follow-up.  I stressed the importance of continued good blood pressure control in preventing further enlargement and acute aortic  dissection.  I also cautioned him about doing any heavy lifting or strenuous physical activity that may require a Valsalva maneuver and could suddenly raise his blood pressure to high levels.  Plan:  He will contact his primary physician concerning the incidental pulmonary emboli seen on the CTA of the chest today.  He will return to see me in 1 year with a CTA of the chest for aortic surveillance.  I spent 15 minutes performing this established patient evaluation and > 50% of this time was spent face to face counseling and coordinating the care of this patient's aortic aneurysm.    Alleen Borne, MD [facility] [phone]

## 2022-05-27 ENCOUNTER — Telehealth: Payer: Self-pay | Admitting: Cardiovascular Disease

## 2022-05-27 DIAGNOSIS — Z8679 Personal history of other diseases of the circulatory system: Secondary | ICD-10-CM | POA: Diagnosis not present

## 2022-05-27 DIAGNOSIS — I2699 Other pulmonary embolism without acute cor pulmonale: Secondary | ICD-10-CM | POA: Diagnosis not present

## 2022-05-27 NOTE — Telephone Encounter (Signed)
Called pt spouse ok per DPR.  Reports pt was prescribed Eliquis 2.5 mg PO BID d/t small clot on lung.  Prescriber asked pt to clear with cardiology d/t amiodarone.   Advised spouse if pt has a blood clot should take med as prescribed.  If there is an interaction pt pharmacy will alert prescribing provider.  Advised will send to our pharmacist to advise.

## 2022-05-27 NOTE — Telephone Encounter (Signed)
Agree thanks Thayer Ohm

## 2022-05-27 NOTE — Telephone Encounter (Signed)
Eliquis 2.5mg  is below standard dose for new acute PE, despite patient being on amiodarone. Possible genetic component as well per Dr Laneta Simmers. Also had PE in 2020.  Eliquis PE dosing: 10mg  BID for 7 days then reduce to 5mg  twice daily.

## 2022-05-27 NOTE — Telephone Encounter (Signed)
Pt c/o medication issue:  1. Name of Medication: Eliquis 2.5mg    2. How are you currently taking this medication (dosage and times per day)? Daily   3. Are you having a reaction (difficulty breathing--STAT)? No   4. What is your medication issue? Pt spouse called in stating pt is supposed to start eliquis 2.5 mg daily by Dr. Laneta Simmers and was told to reach out to Dr. Excell Seltzer or Lorin Picket, PA and see if they are okay with this

## 2022-05-29 MED ORDER — APIXABAN (ELIQUIS) VTE STARTER PACK (10MG AND 5MG): ORAL_TABLET | ORAL | 0 refills | Status: DC

## 2022-05-29 NOTE — Telephone Encounter (Signed)
Spoke with patient's wife and explained dosing of Eliquis. Patient picked up Eliquis 2.5mg  yesterday. Advised we will switch to 10mg  BID x 7 days and then switch to 5mg  BID. Wife appreciative of call.

## 2022-06-05 NOTE — Progress Notes (Signed)
Remote ICD transmission.   

## 2022-06-20 ENCOUNTER — Other Ambulatory Visit: Payer: Self-pay | Admitting: Internal Medicine

## 2022-06-20 ENCOUNTER — Other Ambulatory Visit: Payer: Self-pay | Admitting: *Deleted

## 2022-06-20 MED ORDER — AMLODIPINE BESYLATE 2.5 MG PO TABS
ORAL_TABLET | ORAL | 0 refills | Status: DC
Start: 2022-06-20 — End: 2022-07-19

## 2022-07-03 ENCOUNTER — Inpatient Hospital Stay: Payer: Medicare (Managed Care) | Attending: Hematology and Oncology | Admitting: Hematology and Oncology

## 2022-07-03 ENCOUNTER — Other Ambulatory Visit: Payer: Self-pay

## 2022-07-03 ENCOUNTER — Inpatient Hospital Stay: Payer: Medicare (Managed Care)

## 2022-07-03 VITALS — BP 148/103 | HR 60 | Temp 97.8°F | Resp 13 | Wt 157.3 lb

## 2022-07-03 DIAGNOSIS — Z8249 Family history of ischemic heart disease and other diseases of the circulatory system: Secondary | ICD-10-CM | POA: Diagnosis not present

## 2022-07-03 DIAGNOSIS — Z8042 Family history of malignant neoplasm of prostate: Secondary | ICD-10-CM | POA: Diagnosis not present

## 2022-07-03 DIAGNOSIS — I2694 Multiple subsegmental pulmonary emboli without acute cor pulmonale: Secondary | ICD-10-CM | POA: Diagnosis not present

## 2022-07-03 DIAGNOSIS — Z8674 Personal history of sudden cardiac arrest: Secondary | ICD-10-CM | POA: Insufficient documentation

## 2022-07-03 DIAGNOSIS — Z833 Family history of diabetes mellitus: Secondary | ICD-10-CM | POA: Insufficient documentation

## 2022-07-03 DIAGNOSIS — Z7901 Long term (current) use of anticoagulants: Secondary | ICD-10-CM | POA: Insufficient documentation

## 2022-07-03 LAB — CBC WITH DIFFERENTIAL (CANCER CENTER ONLY)
Abs Immature Granulocytes: 0.01 10*3/uL (ref 0.00–0.07)
Basophils Absolute: 0 10*3/uL (ref 0.0–0.1)
Basophils Relative: 0 %
Eosinophils Absolute: 0 10*3/uL (ref 0.0–0.5)
Eosinophils Relative: 1 %
HCT: 47 % (ref 39.0–52.0)
Hemoglobin: 15.1 g/dL (ref 13.0–17.0)
Immature Granulocytes: 0 %
Lymphocytes Relative: 28 %
Lymphs Abs: 1.3 10*3/uL (ref 0.7–4.0)
MCH: 28.3 pg (ref 26.0–34.0)
MCHC: 32.1 g/dL (ref 30.0–36.0)
MCV: 88.2 fL (ref 80.0–100.0)
Monocytes Absolute: 0.4 10*3/uL (ref 0.1–1.0)
Monocytes Relative: 9 %
Neutro Abs: 2.7 10*3/uL (ref 1.7–7.7)
Neutrophils Relative %: 62 %
Platelet Count: 218 10*3/uL (ref 150–400)
RBC: 5.33 MIL/uL (ref 4.22–5.81)
RDW: 14.6 % (ref 11.5–15.5)
WBC Count: 4.5 10*3/uL (ref 4.0–10.5)
nRBC: 0 % (ref 0.0–0.2)

## 2022-07-03 LAB — CMP (CANCER CENTER ONLY)
ALT: 14 U/L (ref 0–44)
AST: 16 U/L (ref 15–41)
Albumin: 4.2 g/dL (ref 3.5–5.0)
Alkaline Phosphatase: 89 U/L (ref 38–126)
Anion gap: 7 (ref 5–15)
BUN: 15 mg/dL (ref 8–23)
CO2: 26 mmol/L (ref 22–32)
Calcium: 9.5 mg/dL (ref 8.9–10.3)
Chloride: 108 mmol/L (ref 98–111)
Creatinine: 1.27 mg/dL — ABNORMAL HIGH (ref 0.61–1.24)
GFR, Estimated: >60 mL/min (ref >60)
Glucose, Bld: 83 mg/dL (ref 70–99)
Potassium: 4 mmol/L (ref 3.5–5.1)
Sodium: 141 mmol/L (ref 135–145)
Total Bilirubin: 1.3 mg/dL — ABNORMAL HIGH (ref 0.3–1.2)
Total Protein: 8.4 g/dL — ABNORMAL HIGH (ref 6.5–8.1)

## 2022-07-03 MED ORDER — APIXABAN 5 MG PO TABS: 5.0000 mg | ORAL_TABLET | Freq: Two times a day (BID) | ORAL | 2 refills | Status: DC

## 2022-07-03 MED ORDER — ROSUVASTATIN CALCIUM 5 MG PO TABS: 5.0000 mg | ORAL_TABLET | Freq: Every day | ORAL | 1 refills | Status: AC

## 2022-07-03 NOTE — Progress Notes (Signed)
[facility] [phone]   [phone]  INITIAL CONSULT NOTE  Patient Care Team: [name], MD as PCP - General (Family Medicine) Tonny Bollman, MD as PCP - Cardiology (Cardiology) Marinus Maw, MD as PCP - Electrophysiology (Cardiology)  Hematological/Oncological History # Recurrent Pulmonary Embolism  04/04/2018: Admitted for cardiac arrest. CT PE study showed new, acute subsegmental nonocclusive pulmonary embolism in branches of the right lower lobe and right middle lobe pulmonary arteries. 05/22/2022: patient under CT angio study which showed an incidental finding of acute pulmonary emboli within segmental and subsegmental branches of the right upper lobe and left lower lobe pulmonary arteries. 07/03/2022: establish care with Dr. Leonides Schanz   CHIEF COMPLAINTS/PURPOSE OF CONSULTATION:  "Recurrent Pulmonary Embolism "  HISTORY OF PRESENTING ILLNESS:  Kevin Rojas 67 y.o. male with medical history significant for aortic valve replacement in 2020, cardiac arrest, ICD placement, and pulmonary embolism in 2020 who presents for evaluation of recurrent VTE.  On review of the previous records Kevin Rojas was admitted after a cardiac arrest in March 2020.  During that admission he underwent a CT PE study on 04/04/2018 which showed new acute subsegmental nonocclusive pulmonary embolism in the branches of the right lower lobe and right middle lobe pulmonary arteries.  He was treated with limited duration of anticoagulation therapy.  On 05/22/2022 the patient underwent a CT angio study for cardiac reasons and incidentally was found to have an acute pulmonary emboli within the segmental and subsegmental branches of the right upper lobe and left lower lobe pulmonary arteries.  Due to concern for this recurrent VTE the patient was referred to hematology for further evaluation and management.  On exam today Kevin Rojas is accompanied by his wife.  He reports that he had  open heart surgery in 2020 and the initial blood clot was after that admission, during which time he did have a cardiac arrest.  He notes that he has had no symptoms preceding his most recent blood clot.  He notes he was surprised to hear that the CT scan showed a clot.  Has not had any chest pain, shortness of breath, or other concerning symptoms such as leg swelling or leg pain.  He reports he has never had any other VTE that he is aware of.  He reports that he is taking Eliquis therapy 5 mg twice daily and tolerating well without difficulty.  He is not having any bleeding, bruising, or dark stools.  On further discussion he reports his mother had blood clots in his maternal grandfather had a heart attack.  His father had prostate cancer.  He had a brother who had a heart attack as well and older brother with type 2 diabetes.  He has 4 healthy children.  He is a never smoker never drinker and is a retired [employer].  He notes he does have some occasional dizzy spells but denies any fevers, chills, sweats, nausea, vomiting or diarrhea.  A full 10 point ROS is otherwise negative.  MEDICAL HISTORY:  Past Medical History:  Diagnosis Date   Abnormal CXR 03/24/2018   Aortic insufficiency    Cardiac arrest (HCC) 03/24/2018   Cognitive disorder    Constipation 04/07/2018   Debility 04/05/2018   Dyspepsia --felt to be due to dysmotility 04/10/2018   Dysrhythmia    Elevated troponin 03/24/2018   Endotracheally intubated 03/24/2018   History of implantable cardioverter-defibrillator (ICD) placement 07/18/2018   History of kidney stones    Hypokalemia 03/24/2018  LBBB (left bundle branch block) 03/24/2018   Pneumonia    history of   Pulmonary embolism (HCC)    04/04/18   S/P AVR (aortic valve replacement)    Echocardiogram 07/03/2018: EF 45-50, septal-lateral dyssynchrony, mild LVH, grade 2 diastolic dysfunction, normal RVSF, mild MR, bioprosthetic AoV with no regurgitation and mildly elevated mean gradient  at 18, mild dilation of the ascending aorta (42), PASP 21   Thoracic aortic aneurysm (HCC) 06/24/2018   CT 03/2018:  Ao root 4.2 cm; ascending Aorta 4.3 cm   Ventricular fibrillation (HCC) 03/24/2018    SURGICAL HISTORY: Past Surgical History:  Procedure Laterality Date   AORTIC ARCH ANGIOGRAPHY N/A 04/02/2018   Procedure: AORTIC ARCH ANGIOGRAPHY;  Surgeon: Lennette Bihari, MD;  Location: [facility] INVASIVE CV LAB;  Service: Cardiovascular;  Laterality: N/A;   AORTIC VALVE REPLACEMENT N/A 06/08/2018   Procedure: AORTIC VALVE REPLACEMENT (AVR), ON PUMP, USING MAGNA EASE AORTIC BIOPROSTHESIS VALVE ;  Surgeon: Delight Ovens, MD;  Location: [facility] OR;  Service: Open Heart Surgery;  Laterality: N/A;   BIV ICD INSERTION CRT-D N/A 07/17/2018   Procedure: BIV ICD INSERTION CRT-D;  Surgeon: Marinus Maw, MD;  Location: [facility] INVASIVE CV LAB;  Service: Cardiovascular;  Laterality: N/A;   PACEMAKER INSERTION     RIGHT/LEFT HEART CATH AND CORONARY ANGIOGRAPHY N/A 04/02/2018   Procedure: RIGHT/LEFT HEART CATH AND CORONARY ANGIOGRAPHY;  Surgeon: Lennette Bihari, MD;  Location: [facility] INVASIVE CV LAB;  Service: Cardiovascular;  Laterality: N/A;   TEE WITHOUT CARDIOVERSION N/A 06/08/2018   Procedure: TRANSESOPHAGEAL ECHOCARDIOGRAM (TEE);  Surgeon: Delight Ovens, MD;  [facility] OR;  Service: Open Heart Surgery;  Laterality: N/A;    SOCIAL HISTORY: Social History   Socioeconomic History   Marital status: Married    Spouse name: Not on file   Number of children: Not on file   Years of education: Not on file   Highest education level: Not on file  Occupational History   Not on file  Tobacco Use   Smoking status: Never   Smokeless tobacco: Never  Substance and Sexual Activity   Alcohol use: Never   Drug use: Never   Sexual activity: Not on file  Other Topics Concern   Not on file  Social History Narrative   Not on file   Social Determinants of Health   Financial Resource Strain: Not on file  Food  Insecurity: Not on file  Transportation Needs: Not on file  Physical Activity: Not on file  Stress: Not on file  Social Connections: Not on file  Intimate Partner Violence: Not on file    FAMILY HISTORY: Family History  Problem Relation Age of Onset   Pulmonary embolism Mother    Prostate cancer Father        passed awary at 47    ALLERGIES:  has No Known Allergies.  MEDICATIONS:  Current Outpatient Medications  Medication Sig Dispense Refill   apixaban (ELIQUIS) 5 MG TABS tablet Take 1 tablet (5 mg total) by mouth 2 (two) times daily. 180 tablet 2   rosuvastatin (CRESTOR) 5 MG tablet Take 1 tablet (5 mg total) by mouth daily. 90 tablet 1   acetaminophen (TYLENOL) 325 MG tablet Take 1-2 tablets (325-650 mg total) by mouth every 4 (four) hours as needed for mild pain.     amiodarone (PACERONE) 200 MG tablet Take one tablet by mouth daily Monday through Friday.  DO NOT TAKE any tablets on Saturday or Sunday. 90 tablet 3  amLODipine (NORVASC) 2.5 MG tablet TAKE 1 TABLET(2.5 MG) BY MOUTH DAILY. 1st attempt. Pt needs a yearly appointment for # 90 supply or future refills. 30 tablet 0   losartan (COZAAR) 25 MG tablet TAKE 2 TABLETS(50 MG) BY MOUTH DAILY (Patient taking differently: TAKE 1 TABLETS BY MOUTH DAILY) 180 tablet 3   metoprolol tartrate (LOPRESSOR) 25 MG tablet TAKE 1 TABLET(25 MG) BY MOUTH TWICE DAILY 180 tablet 3   No current facility-administered medications for this visit.    REVIEW OF SYSTEMS:   Constitutional: ( - ) fevers, ( - )  chills , ( - ) night sweats Eyes: ( - ) blurriness of vision, ( - ) double vision, ( - ) watery eyes Ears, nose, mouth, throat, and face: ( - ) mucositis, ( - ) sore throat Respiratory: ( - ) cough, ( - ) dyspnea, ( - ) wheezes Cardiovascular: ( - ) palpitation, ( - ) chest discomfort, ( - ) lower extremity swelling Gastrointestinal:  ( - ) nausea, ( - ) heartburn, ( - ) change in bowel habits Skin: ( - ) abnormal skin rashes Lymphatics: (  - ) new lymphadenopathy, ( - ) easy bruising Neurological: ( - ) numbness, ( - ) tingling, ( - ) new weaknesses Behavioral/Psych: ( - ) mood change, ( - ) new changes  All other systems were reviewed with the patient and are negative.  PHYSICAL EXAMINATION:  Vitals:   07/03/22 0926  BP: (!) 148/103  Pulse: 60  Resp: 13  Temp: 97.8 F (36.6 C)  SpO2: 100%   Filed Weights   07/03/22 0926  Weight: 157 lb 4.8 oz (71.4 kg)    GENERAL: well appearing elderly African-American male in NAD  SKIN: skin color, texture, turgor are normal, no rashes or significant lesions EYES: conjunctiva are pink and non-injected, sclera clear LUNGS: clear to auscultation and percussion with normal breathing effort HEART: regular rate & rhythm and no murmurs and no lower extremity edema Musculoskeletal: no cyanosis of digits and no clubbing  PSYCH: alert & oriented x 3, fluent speech NEURO: no focal motor/sensory deficits  LABORATORY DATA:  I have reviewed the data as listed    Latest Ref Rng & Units 07/03/2022   10:15 AM 12/15/2018    1:08 PM 06/30/2018    9:22 AM  CBC  WBC 4.0 - 10.5 K/uL 4.5  4.7  5.8   Hemoglobin 13.0 - 17.0 g/dL 13.2  44.0  10.2   Hematocrit 39.0 - 52.0 % 47.0  44.5  36.4   Platelets 150 - 400 K/uL 218  222  471        Latest Ref Rng & Units 07/03/2022   10:15 AM 02/08/2019    2:04 PM 01/20/2019   11:41 AM  CMP  Glucose 70 - 99 mg/dL 83  79  87   BUN 8 - 23 mg/dL 15  14  13    Creatinine 0.61 - 1.24 mg/dL 7.25  3.66  4.40   Sodium 135 - 145 mmol/L 141  142  139   Potassium 3.5 - 5.1 mmol/L 4.0  4.4  4.4   Chloride 98 - 111 mmol/L 108  105  100   CO2 22 - 32 mmol/L 26  26  26    Calcium 8.9 - 10.3 mg/dL 9.5  9.2  9.5   Total Protein 6.5 - 8.1 g/dL 8.4     Total Bilirubin 0.3 - 1.2 mg/dL 1.3     Alkaline Phos 38 -  126 U/L 89     AST 15 - 41 U/L 16     ALT 0 - 44 U/L 14        ASSESSMENT & PLAN Kevin Rojas 67 y.o. male with medical history significant for aortic  valve replacement in 2020, cardiac arrest, ICD placement, and pulmonary embolism in 2020 who presents for evaluation of recurrent VTE.  After review of the labs, review of the records, and discussion with the patient the patients findings are most consistent with recurrent VTE's, the most recent being unprovoked.  A provoked venous thromboembolism (VTE) is one that has a clear inciting factor or event. Provoking factors include prolonged travel/immobility, surgery (particular abdominal or orthropedic), trauma,  and pregnancy/ estrogen containing birth control. After a detailed history and review of the records there is no clear provoking factor for this patient's VTE.  Patients with unprovoked VTEs have up to 25% recurrence after 5 years and 36% at 10 years, with 4% of these clots being fatal (BMJ?2019;366:l4363). Therefore the formal recommendation for unprovoked VTE's is lifelong anticoagulation, as the cause may not be transient or reversible. We recommend 6 months or full strength anticoagulation with a re-evaluation after that time.  The patient's will then have a choice of maintenance dose DOAC (preferred, recommended), 81mg  ASA PO daily (non-preferred), or no further anticoagulation (not recommended).    #Unprovoked Pulmonary Embolism  # Recurrent Pulmonary Embolism  --findings at this time are consistent with a unprovoked VTE  --will order baseline CMP and CBC to assure labs are adequate for DOAC therapy  --rule out APS with anticardiolipin and anti beta2 glycoprotein antibodies.  Lupus anticoagulant panel would be altered by presence of blood thinner, will hold on this testing.   --recommend the patient continue eliquis 5mg  BID --patient denies any bleeding, bruising, or dark stools on this medication. It is well tolerated. No difficulties accessing/affording the medication  --RTC in 6 months' time with strict return precautions for overt signs of bleeding.    Orders Placed This Encounter   Procedures   CBC with Differential (Cancer Center Only)    Standing Status:   Future    Number of Occurrences:   1    Standing Expiration Date:   07/03/2023   CMP (Cancer Center only)    Standing Status:   Future    Number of Occurrences:   1    Standing Expiration Date:   07/03/2023   Beta-2-glycoprotein i abs, IgG/M/A    Standing Status:   Future    Number of Occurrences:   1    Standing Expiration Date:   07/03/2023   Cardiolipin antibodies, IgG, IgM, IgA*    Standing Status:   Future    Number of Occurrences:   1    Standing Expiration Date:   07/03/2023    All questions were answered. The patient knows to call the clinic with any problems, questions or concerns.  A total of more than 60 minutes were spent on this encounter with face-to-face time and non-face-to-face time, including preparing to see the patient, ordering tests and/or medications, counseling the patient and coordination of care as outlined above.   Kevin Barns, MD Department of Hematology/Oncology [facility] Phone: [phone] Pager: [phone] Email: [email or web address]  07/03/2022 5:17 PM

## 2022-07-04 ENCOUNTER — Telehealth: Payer: Self-pay | Admitting: Hematology and Oncology

## 2022-07-05 LAB — CARDIOLIPIN ANTIBODIES, IGG, IGM, IGA
Anticardiolipin IgA: <9 APL U/mL (ref 0–11)
Anticardiolipin IgG: 22 GPL U/mL — ABNORMAL HIGH (ref 0–14)
Anticardiolipin IgM: <9 MPL U/mL (ref 0–12)

## 2022-07-05 LAB — BETA-2-GLYCOPROTEIN I ABS, IGG/M/A
Beta-2 Glyco I IgG: <9 GPI IgG units (ref 0–20)
Beta-2-Glycoprotein I IgA: 9 GPI IgA units (ref 0–25)
Beta-2-Glycoprotein I IgM: <9 GPI IgM units (ref 0–32)

## 2022-07-19 ENCOUNTER — Other Ambulatory Visit: Payer: Self-pay | Admitting: Internal Medicine

## 2022-08-08 IMAGING — CT CT ANGIO CHEST
2 of 6 series · 13 of 36 positions shown · IV contrast (iopamidol)
Comparison: April 04, 2018.

CLINICAL DATA: Thoracic aortic aneurysm.

EXAM:
CT ANGIOGRAPHY CHEST WITH CONTRAST
TECHNIQUE: Multidetector CT imaging of the chest was performed using the
standard protocol during bolus administration of intravenous
contrast. Multiplanar CT image reconstructions and MIPs were
obtained to evaluate the vascular anatomy.
CONTRAST:  60mL GH8XIV-HEP IOPAMIDOL (GH8XIV-HEP) INJECTION 76%

[Series 4: cta thorax 2.00 bv36 s3 axial arterial · axial · arterial · 0.58mm/px · z∈[+1673,+1931]mm · 12 of 153 slices shown]
[im 12/153  lung]
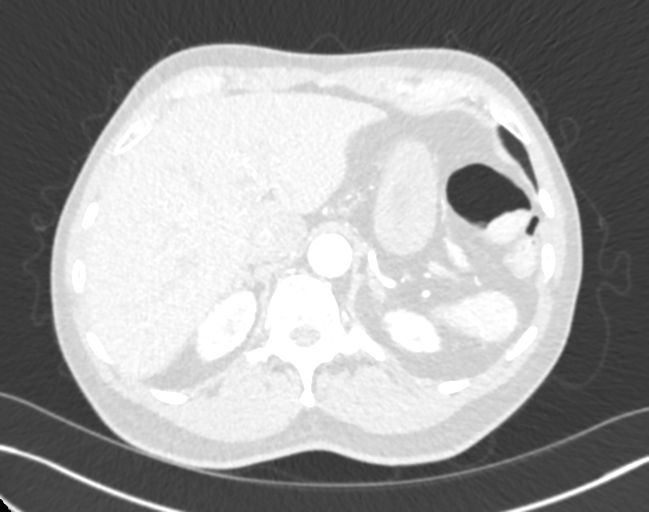
[im 24/153  mediastinal]
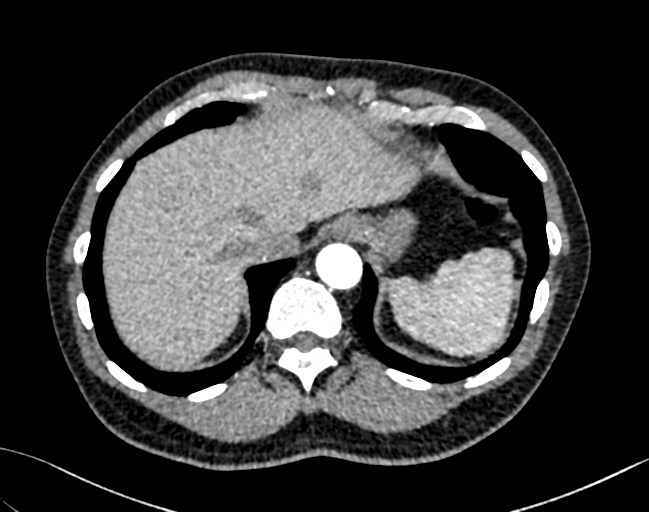
[im 36/153  lung]
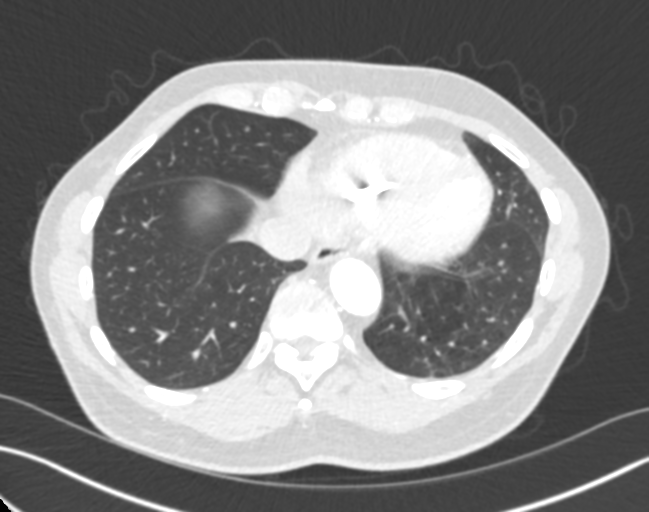
[im 47/153  mediastinal]
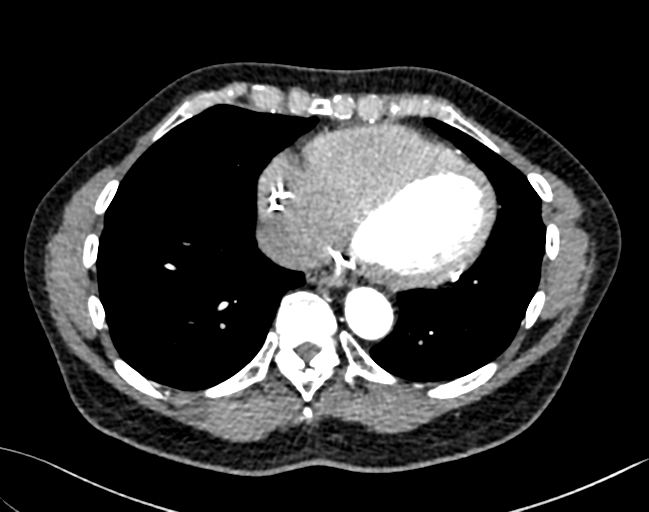
[im 59/153  lung]
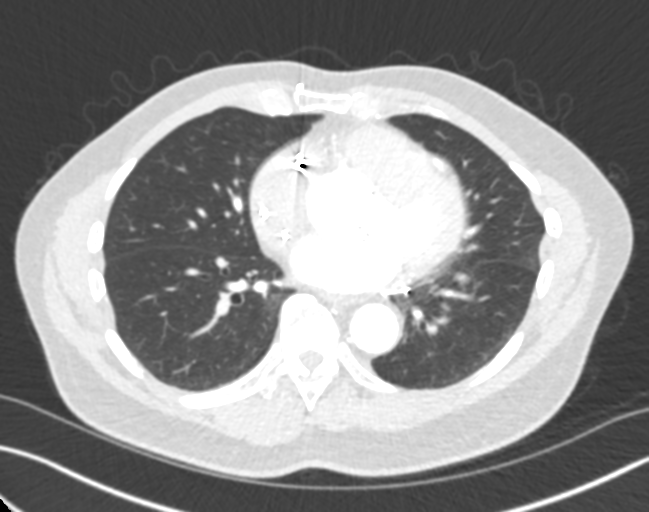
[im 71/153  mediastinal]
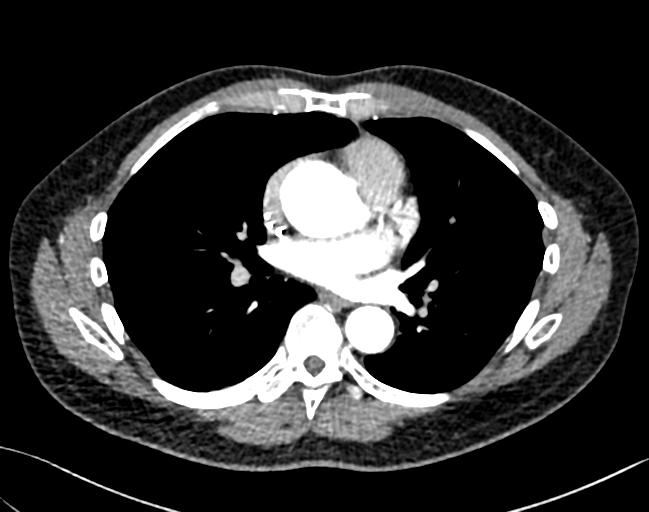
[im 82/153  lung]
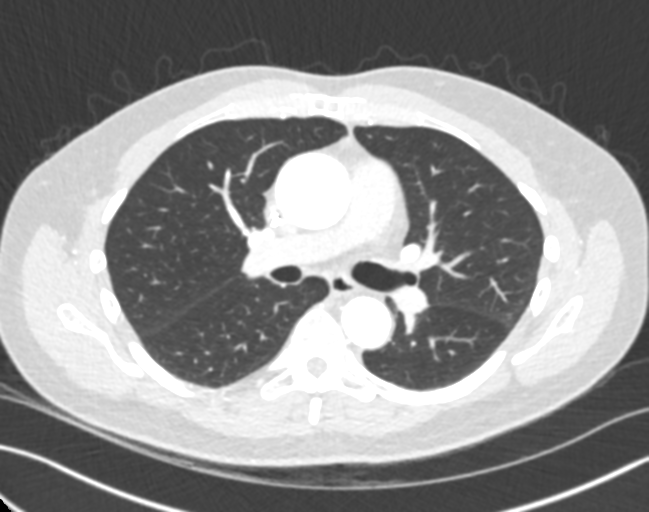
[im 94/153  mediastinal]
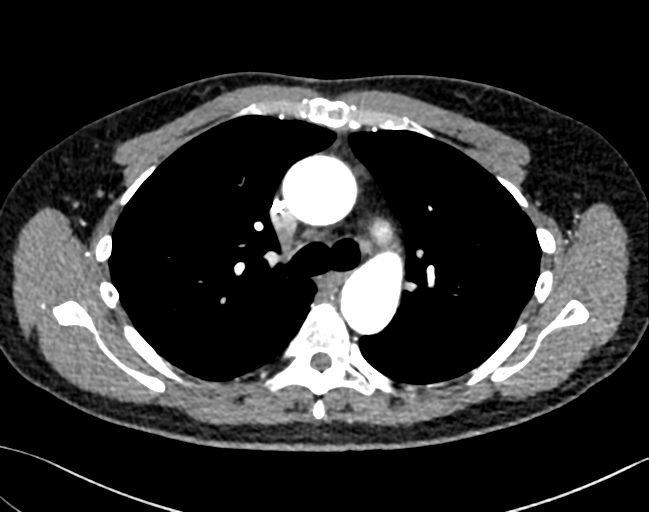
[im 106/153  lung]
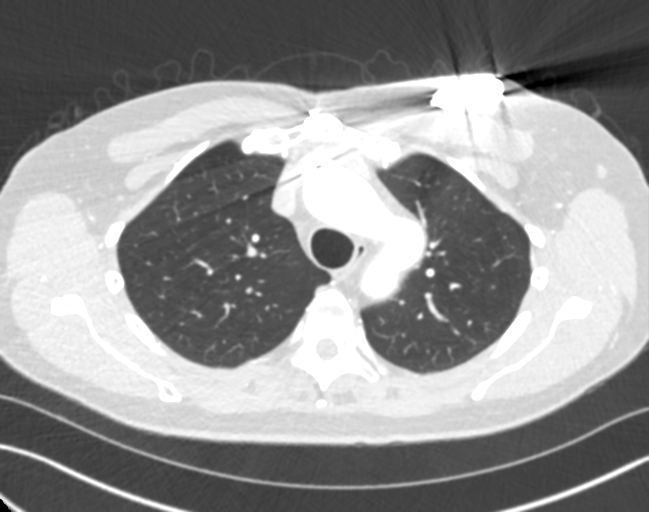
[im 117/153  mediastinal]
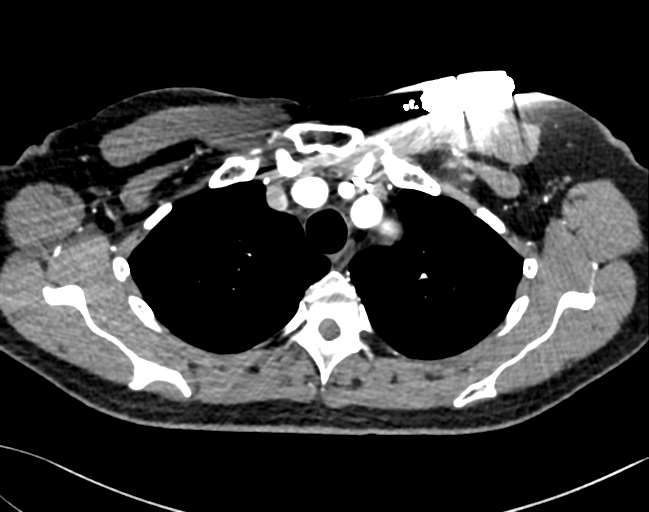
[im 129/153  lung]
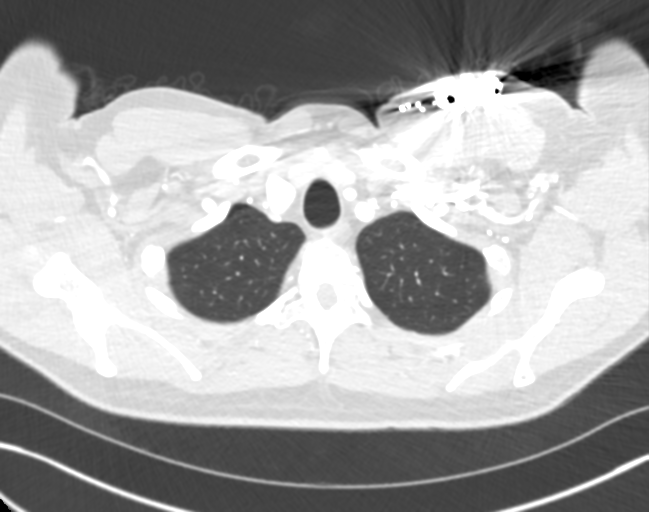
[im 141/153  mediastinal]
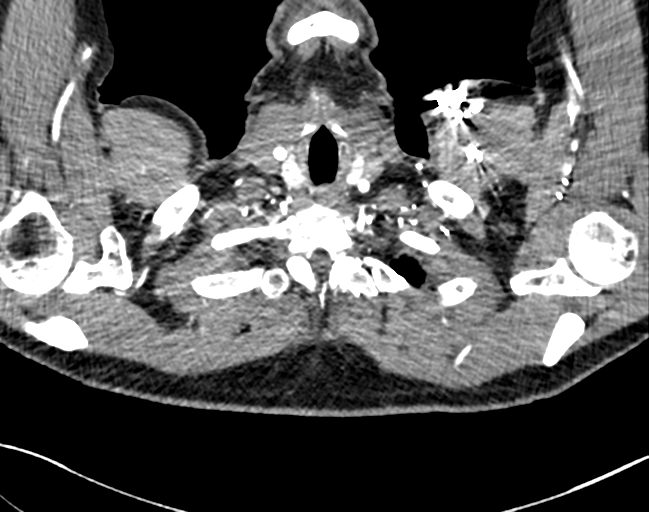

[Series 9: cta thorax 2.00 bv36 s3 cor st · coronal · 0.60mm/px · 1 of 147 slices shown]
[im 74/147  mediastinal]
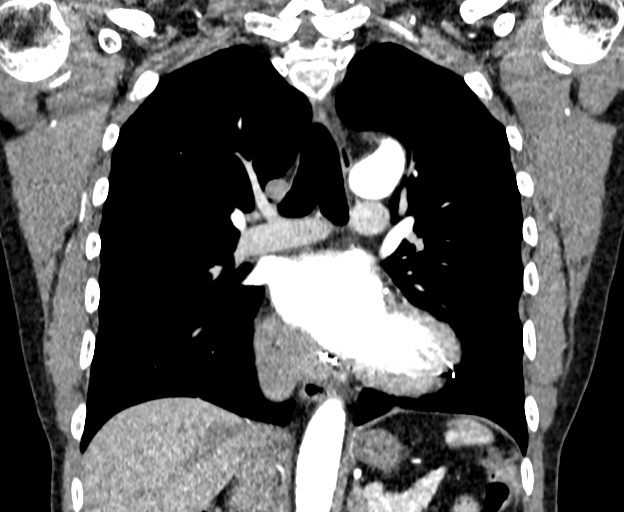

[13 of 36 positions shown; findings below may reference images not displayed]

FINDINGS: Cardiovascular: Grossly stable 4.3 cm ascending thoracic aortic
aneurysm is noted. Status post aortic valve replacement. No
dissection is noted. Great vessels are widely patent without
significant stenosis. Stable tortuosity of distal portion of
transverse aortic arch and proximal descending thoracic aorta is
noted. Normal cardiac size. No pericardial effusion. Left-sided
pacemaker is unchanged.

Mediastinum/Nodes: No enlarged mediastinal, hilar, or axillary lymph
nodes. Thyroid gland, trachea, and esophagus demonstrate no
significant findings.

Lungs/Pleura: Lungs are clear. No pleural effusion or pneumothorax.

Upper Abdomen: No acute abnormality.

Musculoskeletal: No chest wall abnormality. No acute or significant
osseous findings.

Review of the MIP images confirms the above findings.
IMPRESSION: 1. Grossly stable 4.3 cm ascending thoracic aortic aneurysm.
Recommend annual imaging followup by CTA or MRA. This recommendation
follows 4757 ACCF/AHA/AATS/ACR/ASA/SCA/KHALILI/JEANSON/JOHN-PAUL/GIORGI Guidelines
for the Diagnosis and Management of Patients with Thoracic Aortic
Disease. Circulation. 4757; 121: E266-e369. Aortic aneurysm NOS
([id number]).
2. Status post aortic valve replacement.
3. No other abnormality seen in the chest.

## 2022-08-14 ENCOUNTER — Ambulatory Visit (INDEPENDENT_AMBULATORY_CARE_PROVIDER_SITE_OTHER): Payer: Medicare (Managed Care)

## 2022-08-14 DIAGNOSIS — I428 Other cardiomyopathies: Secondary | ICD-10-CM

## 2022-08-14 DIAGNOSIS — I447 Left bundle-branch block, unspecified: Secondary | ICD-10-CM | POA: Diagnosis not present

## 2022-08-29 NOTE — Progress Notes (Signed)
Remote ICD transmission.   

## 2022-09-03 ENCOUNTER — Other Ambulatory Visit: Payer: Self-pay | Admitting: *Deleted

## 2022-09-03 MED ORDER — AMIODARONE HCL 200 MG PO TABS: ORAL_TABLET | ORAL | 0 refills | Status: DC

## 2022-09-16 ENCOUNTER — Other Ambulatory Visit: Payer: Self-pay | Admitting: Internal Medicine

## 2022-09-30 ENCOUNTER — Other Ambulatory Visit: Payer: Self-pay | Admitting: Internal Medicine

## 2022-09-30 ENCOUNTER — Telehealth: Payer: Self-pay | Admitting: Internal Medicine

## 2022-09-30 MED ORDER — AMLODIPINE BESYLATE 2.5 MG PO TABS: ORAL_TABLET | ORAL | 0 refills | Status: DC

## 2022-09-30 NOTE — Telephone Encounter (Signed)
*  STAT* If patient is at the pharmacy, call can be transferred to refill team.   1. Which medications need to be refilled? (please list name of each medication and dose if known) amLODipine (NORVASC) 2.5 MG tablet  2. Which pharmacy/location (including street and city if local pharmacy) is medication to be sent to? [id number] - [place], [place] - [address] AT [facility] OF [address]  3. Do they need a 30 day or 90 day supply?   90 day supply

## 2022-10-14 ENCOUNTER — Other Ambulatory Visit: Payer: Self-pay | Admitting: Internal Medicine

## 2022-11-08 ENCOUNTER — Ambulatory Visit: Payer: Medicare (Managed Care) | Attending: Student | Admitting: Student

## 2022-11-08 ENCOUNTER — Encounter: Payer: Self-pay | Admitting: Student

## 2022-11-08 VITALS — BP 116/78 | HR 60 | Ht 69.0 in | Wt 158.0 lb

## 2022-11-08 DIAGNOSIS — I447 Left bundle-branch block, unspecified: Secondary | ICD-10-CM | POA: Diagnosis not present

## 2022-11-08 DIAGNOSIS — I4901 Ventricular fibrillation: Secondary | ICD-10-CM | POA: Diagnosis not present

## 2022-11-08 DIAGNOSIS — Z86711 Personal history of pulmonary embolism: Secondary | ICD-10-CM | POA: Diagnosis not present

## 2022-11-08 DIAGNOSIS — I428 Other cardiomyopathies: Secondary | ICD-10-CM

## 2022-11-08 LAB — CUP PACEART INCLINIC DEVICE CHECK
Date Time Interrogation Session: 20241101110216
HighPow Impedance: 70 Ohm
Implantable Lead Connection Status: 753985
Implantable Lead Connection Status: 753985
Implantable Lead Connection Status: 753985
Implantable Lead Implant Date: 20200710
Implantable Lead Implant Date: 20200710
Implantable Lead Implant Date: 20200710
Implantable Lead Location: 753858
Implantable Lead Location: 753859
Implantable Lead Location: 753860
Implantable Lead Model: 293
Implantable Lead Model: 4677
Implantable Lead Model: 7841
Implantable Lead Serial Number: 1006750
Implantable Lead Serial Number: 443715
Implantable Lead Serial Number: 814245
Implantable Pulse Generator Implant Date: 20200710
Lead Channel Impedance Value: 484 Ohm
Lead Channel Impedance Value: 524 Ohm
Lead Channel Impedance Value: 678 Ohm
Lead Channel Pacing Threshold Amplitude: 0.8 V
Lead Channel Pacing Threshold Amplitude: 0.9 V
Lead Channel Pacing Threshold Amplitude: 1.2 V
Lead Channel Pacing Threshold Pulse Width: 0.4 ms
Lead Channel Pacing Threshold Pulse Width: 0.4 ms
Lead Channel Pacing Threshold Pulse Width: 0.4 ms
Lead Channel Sensing Intrinsic Amplitude: 24.6 mV
Lead Channel Sensing Intrinsic Amplitude: 25 mV
Lead Channel Setting Pacing Amplitude: 2 V
Lead Channel Setting Pacing Amplitude: 2.5 V
Lead Channel Setting Pacing Amplitude: 2.5 V
Lead Channel Setting Pacing Pulse Width: 0.4 ms
Lead Channel Setting Pacing Pulse Width: 0.4 ms
Lead Channel Setting Sensing Sensitivity: 0.5 mV
Lead Channel Setting Sensing Sensitivity: 1 mV
Pulse Gen Serial Number: 228146
Zone Setting Status: 755011

## 2022-11-08 NOTE — Patient Instructions (Signed)
Medication Instructions:  Your physician recommends that you continue on your current medications as directed. Please refer to the Current Medication list given to you today.  *If you need a refill on your cardiac medications before your next appointment, please call your pharmacy*  Lab Work: CMET, TSH, FreeT4-TODAY If you have labs (blood work) drawn today and your tests are completely normal, you will receive your results only by: MyChart Message (if you have MyChart) OR A paper copy in the mail If you have any lab test that is abnormal or we need to change your treatment, we will call you to review the results.  Follow-Up: At [facility], you and your health needs are our priority.  As part of our continuing mission to provide you with exceptional heart care, we have created designated Provider Care Teams.  These Care Teams include your primary Cardiologist (physician) and Advanced Practice Providers (APPs -  Physician Assistants and Nurse Practitioners) who all work together to provide you with the care you need, when you need it.  We recommend signing up for the patient portal called "MyChart".  Sign up information is provided on this After Visit Summary.  MyChart is used to connect with patients for Virtual Visits (Telemedicine).  Patients are able to view lab/test results, encounter notes, upcoming appointments, etc.  Non-urgent messages can be sent to your provider as well.   To learn more about what you can do with MyChart, go to [email or web address].    Your next appointment:   6 month(s)  Provider:   Lewayne Bunting, MD

## 2022-11-09 LAB — COMPREHENSIVE METABOLIC PANEL
ALT: 19 [IU]/L (ref 0–44)
AST: 20 [IU]/L (ref 0–40)
Albumin: 4.3 g/dL (ref 3.9–4.9)
Alkaline Phosphatase: 87 [IU]/L (ref 44–121)
BUN/Creatinine Ratio: 10 (ref 10–24)
BUN: 16 mg/dL (ref 8–27)
Bilirubin Total: 0.9 mg/dL (ref 0.0–1.2)
CO2: 23 mmol/L (ref 20–29)
Calcium: 9.2 mg/dL (ref 8.6–10.2)
Chloride: 107 mmol/L — ABNORMAL HIGH (ref 96–106)
Creatinine, Ser: 1.58 mg/dL — ABNORMAL HIGH (ref 0.76–1.27)
Globulin, Total: 3 g/dL (ref 1.5–4.5)
Glucose: 79 mg/dL (ref 70–99)
Potassium: 4.6 mmol/L (ref 3.5–5.2)
Sodium: 143 mmol/L (ref 134–144)
Total Protein: 7.3 g/dL (ref 6.0–8.5)
eGFR: 48 mL/min/{1.73_m2} — ABNORMAL LOW (ref >59)

## 2022-11-09 LAB — TSH: TSH: 0.955 u[IU]/mL (ref 0.450–4.500)

## 2022-11-09 LAB — T4, FREE: Free T4: 1.47 ng/dL (ref 0.82–1.77)

## 2022-11-13 ENCOUNTER — Ambulatory Visit (INDEPENDENT_AMBULATORY_CARE_PROVIDER_SITE_OTHER): Payer: Medicare (Managed Care)

## 2022-11-13 DIAGNOSIS — I447 Left bundle-branch block, unspecified: Secondary | ICD-10-CM | POA: Diagnosis not present

## 2022-11-14 ENCOUNTER — Other Ambulatory Visit: Payer: Self-pay | Admitting: Internal Medicine

## 2022-11-14 ENCOUNTER — Inpatient Hospital Stay: Payer: Medicare (Managed Care) | Admitting: Hematology and Oncology

## 2022-11-14 ENCOUNTER — Inpatient Hospital Stay: Payer: Medicare (Managed Care) | Attending: Hematology and Oncology

## 2022-11-14 ENCOUNTER — Other Ambulatory Visit: Payer: Self-pay | Admitting: Hematology and Oncology

## 2022-11-14 VITALS — BP 122/97 | HR 60 | Temp 97.6°F | Resp 13 | Wt 162.9 lb

## 2022-11-14 DIAGNOSIS — Z833 Family history of diabetes mellitus: Secondary | ICD-10-CM | POA: Diagnosis not present

## 2022-11-14 DIAGNOSIS — Z8042 Family history of malignant neoplasm of prostate: Secondary | ICD-10-CM | POA: Insufficient documentation

## 2022-11-14 DIAGNOSIS — Z8674 Personal history of sudden cardiac arrest: Secondary | ICD-10-CM | POA: Insufficient documentation

## 2022-11-14 DIAGNOSIS — Z86711 Personal history of pulmonary embolism: Secondary | ICD-10-CM | POA: Insufficient documentation

## 2022-11-14 DIAGNOSIS — Z7901 Long term (current) use of anticoagulants: Secondary | ICD-10-CM | POA: Diagnosis not present

## 2022-11-14 DIAGNOSIS — I2694 Multiple subsegmental pulmonary emboli without acute cor pulmonale: Secondary | ICD-10-CM

## 2022-11-14 DIAGNOSIS — Z8249 Family history of ischemic heart disease and other diseases of the circulatory system: Secondary | ICD-10-CM | POA: Insufficient documentation

## 2022-11-14 LAB — CBC WITH DIFFERENTIAL (CANCER CENTER ONLY)
Abs Immature Granulocytes: 0.01 10*3/uL (ref 0.00–0.07)
Basophils Absolute: 0 10*3/uL (ref 0.0–0.1)
Basophils Relative: 1 %
Eosinophils Absolute: 0 10*3/uL (ref 0.0–0.5)
Eosinophils Relative: 1 %
HCT: 45.9 % (ref 39.0–52.0)
Hemoglobin: 15.2 g/dL (ref 13.0–17.0)
Immature Granulocytes: 0 %
Lymphocytes Relative: 26 %
Lymphs Abs: 1.1 10*3/uL (ref 0.7–4.0)
MCH: 28.8 pg (ref 26.0–34.0)
MCHC: 33.1 g/dL (ref 30.0–36.0)
MCV: 86.9 fL (ref 80.0–100.0)
Monocytes Absolute: 0.4 10*3/uL (ref 0.1–1.0)
Monocytes Relative: 9 %
Neutro Abs: 2.7 10*3/uL (ref 1.7–7.7)
Neutrophils Relative %: 63 %
Platelet Count: 200 10*3/uL (ref 150–400)
RBC: 5.28 MIL/uL (ref 4.22–5.81)
RDW: 14.6 % (ref 11.5–15.5)
WBC Count: 4.2 10*3/uL (ref 4.0–10.5)
nRBC: 0 % (ref 0.0–0.2)

## 2022-11-14 LAB — CMP (CANCER CENTER ONLY)
ALT: 16 U/L (ref 0–44)
AST: 17 U/L (ref 15–41)
Albumin: 4.3 g/dL (ref 3.5–5.0)
Alkaline Phosphatase: 74 U/L (ref 38–126)
Anion gap: 6 (ref 5–15)
BUN: 12 mg/dL (ref 8–23)
CO2: 27 mmol/L (ref 22–32)
Calcium: 9.3 mg/dL (ref 8.9–10.3)
Chloride: 108 mmol/L (ref 98–111)
Creatinine: 1.53 mg/dL — ABNORMAL HIGH (ref 0.61–1.24)
GFR, Estimated: 50 mL/min — ABNORMAL LOW (ref >60)
Glucose, Bld: 102 mg/dL — ABNORMAL HIGH (ref 70–99)
Potassium: 4.4 mmol/L (ref 3.5–5.1)
Sodium: 141 mmol/L (ref 135–145)
Total Bilirubin: 1.2 mg/dL — ABNORMAL HIGH (ref <1.2)
Total Protein: 7.8 g/dL (ref 6.5–8.1)

## 2022-11-14 NOTE — Progress Notes (Signed)
[facility] [phone]   Fax:([phone]  PROGRESS NOTE  Patient Care Team: [name], MD as PCP - General (Family Medicine) Tonny Bollman, MD as PCP - Cardiology (Cardiology) Marinus Maw, MD as PCP - Electrophysiology (Cardiology)  Hematological/Oncological History # Recurrent Pulmonary Embolism  04/04/2018: Admitted for cardiac arrest. CT PE study showed new, acute subsegmental nonocclusive pulmonary embolism in branches of the right lower lobe and right middle lobe pulmonary arteries. 05/22/2022: patient under CT angio study which showed an incidental finding of acute pulmonary emboli within segmental and subsegmental branches of the right upper lobe and left lower lobe pulmonary arteries. 07/03/2022: establish care with Dr. Leonides Schanz   Interval History:  Kevin Rojas 67 y.o. male with medical history significant for recurrent pulmonary embolism who presents for a follow up visit. The patient's last visit was on 07/03/2022 at which time he established care. In the interim since the last visit he has had no major changes in his health.  [name] reports that he has been doing well overall since our last visit.  He reports he is had no hospitalizations, emergency room visits, or changes in his medications.  He continues taking his Eliquis 5 mg twice daily without any difficulty.  He is not having any bleeding, bruising, or dark stools.  He has not noticed any side effects as a result of his anticoagulation treatment.  He reports a 14-month supply typically cost about $12.  He notes he is not have any signs or symptoms concerning for recurrent VTE such as leg pain, leg swelling, chest pain, or shortness of breath.  He reports that he is willing and able to continue on anticoagulation therapy at this time.  A full 10 point ROS was otherwise negative.  MEDICAL HISTORY:  Past Medical History:  Diagnosis Date   Abnormal CXR 03/24/2018   Aortic insufficiency     Cardiac arrest (HCC) 03/24/2018   Cognitive disorder    Constipation 04/07/2018   Debility 04/05/2018   Dyspepsia --felt to be due to dysmotility 04/10/2018   Dysrhythmia    Elevated troponin 03/24/2018   Endotracheally intubated 03/24/2018   History of implantable cardioverter-defibrillator (ICD) placement 07/18/2018   History of kidney stones    Hypokalemia 03/24/2018   LBBB (left bundle branch block) 03/24/2018   Pneumonia    history of   Pulmonary embolism (HCC)    04/04/18   S/P AVR (aortic valve replacement)    Echocardiogram 07/03/2018: EF 45-50, septal-lateral dyssynchrony, mild LVH, grade 2 diastolic dysfunction, normal RVSF, mild MR, bioprosthetic AoV with no regurgitation and mildly elevated mean gradient at 18, mild dilation of the ascending aorta (42), PASP 21   Thoracic aortic aneurysm (HCC) 06/24/2018   CT 03/2018:  Ao root 4.2 cm; ascending Aorta 4.3 cm   Ventricular fibrillation (HCC) 03/24/2018    SURGICAL HISTORY: Past Surgical History:  Procedure Laterality Date   AORTIC ARCH ANGIOGRAPHY N/A 04/02/2018   Procedure: AORTIC ARCH ANGIOGRAPHY;  Surgeon: Lennette Bihari, MD;  Location: [facility] INVASIVE CV LAB;  Service: Cardiovascular;  Laterality: N/A;   AORTIC VALVE REPLACEMENT N/A 06/08/2018   Procedure: AORTIC VALVE REPLACEMENT (AVR), ON PUMP, USING MAGNA EASE AORTIC BIOPROSTHESIS VALVE ;  Surgeon: Delight Ovens, MD;  Location: [facility] OR;  Service: Open Heart Surgery;  Laterality: N/A;   BIV ICD INSERTION CRT-D N/A 07/17/2018   Procedure: BIV ICD INSERTION CRT-D;  Surgeon: Marinus Maw, MD;  Location: [facility] INVASIVE CV LAB;  Service: Cardiovascular;  Laterality: N/A;   PACEMAKER INSERTION     RIGHT/LEFT HEART CATH AND CORONARY ANGIOGRAPHY N/A 04/02/2018   Procedure: RIGHT/LEFT HEART CATH AND CORONARY ANGIOGRAPHY;  Surgeon: Lennette Bihari, MD;  Location: [facility] INVASIVE CV LAB;  Service: Cardiovascular;  Laterality: N/A;   TEE WITHOUT CARDIOVERSION N/A 06/08/2018   Procedure:  TRANSESOPHAGEAL ECHOCARDIOGRAM (TEE);  Surgeon: Delight Ovens, MD;  Location: [facility] OR;  Service: Open Heart Surgery;  Laterality: N/A;    SOCIAL HISTORY: Social History   Socioeconomic History   Marital status: Married    Spouse name: Not on file   Number of children: Not on file   Years of education: Not on file   Highest education level: Not on file  Occupational History   Not on file  Tobacco Use   Smoking status: Never   Smokeless tobacco: Never  Substance and Sexual Activity   Alcohol use: Never   Drug use: Never   Sexual activity: Not on file  Other Topics Concern   Not on file  Social History Narrative   Not on file   Social Determinants of Health   Financial Resource Strain: Not on file  Food Insecurity: Not on file  Transportation Needs: Not on file  Physical Activity: Not on file  Stress: No Stress Concern Present (12/10/2019)   Received from [facility], [facility]   [employer] of Occupational Health - Occupational Stress Questionnaire    Feeling of Stress : Not at all  Social Connections: Unknown (05/21/2021)   Received from [facility], [facility]   Social Network    Social Network: Not on file  Intimate Partner Violence: Unknown (04/12/2021)   Received from [facility], [facility]   HITS    Physically Hurt: Not on file    Insult or Talk Down To: Not on file    Threaten Physical Harm: Not on file    Scream or Curse: Not on file    FAMILY HISTORY: Family History  Problem Relation Age of Onset   Pulmonary embolism Mother    Prostate cancer Father        passed awary at 39    ALLERGIES:  has No Known Allergies.  MEDICATIONS:  Current Outpatient Medications  Medication Sig Dispense Refill   acetaminophen (TYLENOL) 325 MG tablet Take 1-2 tablets (325-650 mg total) by mouth every 4 (four) hours as needed for mild pain.     amiodarone (PACERONE) 200 MG tablet TAKE 1 TABLET BY MOUTH DAILY MONDAY THROUGH FRIDAY( DO NOT TAKE ANY  TABLETS ON SATURDAY AND SUNDAY)` 20 tablet 11   amLODipine (NORVASC) 2.5 MG tablet TAKE 1 TABLET(2.5MG ) BY MOUTH DAILY. 60 tablet 0   apixaban (ELIQUIS) 5 MG TABS tablet Take 1 tablet (5 mg total) by mouth 2 (two) times daily. 180 tablet 2   losartan (COZAAR) 25 MG tablet TAKE 2 TABLETS(50 MG) BY MOUTH DAILY (Patient taking differently: TAKE 1 TABLETS BY MOUTH DAILY) 180 tablet 3   metoprolol tartrate (LOPRESSOR) 25 MG tablet TAKE 1 TABLET(25 MG) BY MOUTH TWICE DAILY 180 tablet 3   rosuvastatin (CRESTOR) 5 MG tablet Take 1 tablet (5 mg total) by mouth daily. 90 tablet 1   No current facility-administered medications for this visit.    REVIEW OF SYSTEMS:   Constitutional: ( - ) fevers, ( - )  chills , ( - ) night sweats Eyes: ( - ) blurriness of vision, ( - ) double vision, ( - ) watery eyes Ears, nose, mouth, throat,  and face: ( - ) mucositis, ( - ) sore throat Respiratory: ( - ) cough, ( - ) dyspnea, ( - ) wheezes Cardiovascular: ( - ) palpitation, ( - ) chest discomfort, ( - ) lower extremity swelling Gastrointestinal:  ( - ) nausea, ( - ) heartburn, ( - ) change in bowel habits Skin: ( - ) abnormal skin rashes Lymphatics: ( - ) new lymphadenopathy, ( - ) easy bruising Neurological: ( - ) numbness, ( - ) tingling, ( - ) new weaknesses Behavioral/Psych: ( - ) mood change, ( - ) new changes  All other systems were reviewed with the patient and are negative.  PHYSICAL EXAMINATION:  Vitals:   11/14/22 1147  BP: (!) 122/97  Pulse: 60  Resp: 13  Temp: 97.6 F (36.4 C)  SpO2: 97%   Filed Weights   11/14/22 1147  Weight: 162 lb 14.4 oz (73.9 kg)    GENERAL: Well-appearing middle-age Caucasian male, alert, no distress and comfortable SKIN: skin color, texture, turgor are normal, no rashes or significant lesions EYES: conjunctiva are pink and non-injected, sclera clear LUNGS: clear to auscultation and percussion with normal breathing effort HEART: regular rate & rhythm and no  murmurs and no lower extremity edema Musculoskeletal: no cyanosis of digits and no clubbing  PSYCH: alert & oriented x 3, fluent speech NEURO: no focal motor/sensory deficits  LABORATORY DATA:  I have reviewed the data as listed    Latest Ref Rng & Units 11/14/2022   11:04 AM 07/03/2022   10:15 AM 12/15/2018    1:08 PM  CBC  WBC 4.0 - 10.5 K/uL 4.2  4.5  4.7   Hemoglobin 13.0 - 17.0 g/dL 65.7  84.6  96.2   Hematocrit 39.0 - 52.0 % 45.9  47.0  44.5   Platelets 150 - 400 K/uL 200  218  222        Latest Ref Rng & Units 11/14/2022   11:04 AM 11/08/2022   11:14 AM 07/03/2022   10:15 AM  CMP  Glucose 70 - 99 mg/dL 952  79  83   BUN 8 - 23 mg/dL 12  16  15    Creatinine 0.61 - 1.24 mg/dL 8.41  3.24  4.01   Sodium 135 - 145 mmol/L 141  143  141   Potassium 3.5 - 5.1 mmol/L 4.4  4.6  4.0   Chloride 98 - 111 mmol/L 108  107  108   CO2 22 - 32 mmol/L 27  23  26    Calcium 8.9 - 10.3 mg/dL 9.3  9.2  9.5   Total Protein 6.5 - 8.1 g/dL 7.8  7.3  8.4   Total Bilirubin <1.2 mg/dL 1.2  0.9  1.3   Alkaline Phos 38 - 126 U/L 74  87  89   AST 15 - 41 U/L 17  20  16    ALT 0 - 44 U/L 16  19  14      RADIOGRAPHIC STUDIES: CUP PACEART REMOTE DEVICE CHECK  Result Date: 11/18/2022 Scheduled remote reviewed. Normal device function.  Next remote 91 days. LA, CVRS  CUP PACEART INCLINIC DEVICE CHECK  Result Date: 11/08/2022 CRT-D device check in office. Thresholds and sensing consistent with previous device measurements. Lead impedance trends stable over time. No mode switch episodes recorded. No ventricular arrhythmia episodes recorded. Patient biventricular pacing 100% of  the time. Device programmed with appropriate safety margins. Heart failure diagnostics reviewed and trends are stable for patient. Audible/vibratory alerts demonstrated for patient.  No changes made this session. Estimated longevity >7 years.  Patient enrolled in remote follow up. Plan to check device remotely in 3 months and see in  office in 6 months. Patient education completed   ASSESSMENT & PLAN Kevin Rojas 67 y.o. male with medical history significant for recurrent pulmonary embolism who presents for a follow up visit.   After review of the labs, review of the records, and discussion with the patient the patients findings are most consistent with recurrent VTE's, the most recent being unprovoked.   A provoked venous thromboembolism (VTE) is one that has a clear inciting factor or event. Provoking factors include prolonged travel/immobility, surgery (particular abdominal or orthropedic), trauma,  and pregnancy/ estrogen containing birth control. After a detailed history and review of the records there is no clear provoking factor for this patient's VTE.  Patients with unprovoked VTEs have up to 25% recurrence after 5 years and 36% at 10 years, with 4% of these clots being fatal (BMJ?2019;366:l4363). Therefore the formal recommendation for unprovoked VTE's is lifelong anticoagulation, as the cause may not be transient or reversible. We recommend 6 months or full strength anticoagulation with a re-evaluation after that time.  The patient's will then have a choice of maintenance dose DOAC (preferred, recommended), 81mg  ASA PO daily (non-preferred), or no further anticoagulation (not recommended).     #Unprovoked Pulmonary Embolism  # Recurrent Pulmonary Embolism  --findings at this time are consistent with a unprovoked VTE  --ruled out APS with anticardiolipin and anti beta2 glycoprotein antibodies.  Lupus anticoagulant panel would be altered by presence of blood thinner, will hold on this testing.   --recommend the patient continue eliquis 5mg  BID -- labs today show WBC 4.2, Hgb 15.2, MCV 86.9, Plt 200, Cr 1.53 --patient denies any bleeding, bruising, or dark stools on this medication. It is well tolerated. No difficulties accessing/affording the medication  --RTC in 6 months' time with strict return precautions for overt  signs of bleeding  No orders of the defined types were placed in this encounter.   All questions were answered. The patient knows to call the clinic with any problems, questions or concerns.  A total of more than 25 minutes were spent on this encounter with face-to-face time and non-face-to-face time, including preparing to see the patient, ordering tests and/or medications, counseling the patient and coordination of care as outlined above.   Ulysees Barns, MD Department of Hematology/Oncology [facility] Phone: [phone] Pager: [phone] Email: [email or web address]  11/19/2022 5:03 PM

## 2022-11-18 LAB — CUP PACEART REMOTE DEVICE CHECK
Battery Remaining Longevity: 84 mo
Battery Remaining Percentage: 97 %
Brady Statistic RA Percent Paced: 100 %
Brady Statistic RV Percent Paced: 12 %
Date Time Interrogation Session: 20241108113000
HighPow Impedance: 64 Ohm
Lead Channel Impedance Value: 464 Ohm
Lead Channel Impedance Value: 501 Ohm
Lead Channel Impedance Value: 665 Ohm
Lead Channel Setting Pacing Amplitude: 2 V
Lead Channel Setting Pacing Amplitude: 2.5 V
Lead Channel Setting Pacing Amplitude: 2.5 V
Lead Channel Setting Pacing Pulse Width: 0.4 ms
Lead Channel Setting Pacing Pulse Width: 0.4 ms
Lead Channel Setting Sensing Sensitivity: 0.5 mV
Lead Channel Setting Sensing Sensitivity: 1 mV
Pulse Gen Serial Number: 228146
Zone Setting Status: 755011

## 2022-11-21 ENCOUNTER — Other Ambulatory Visit: Payer: Medicare (Managed Care)

## 2022-11-21 ENCOUNTER — Ambulatory Visit: Payer: Medicare (Managed Care) | Admitting: Hematology and Oncology

## 2022-11-21 DIAGNOSIS — E7800 Pure hypercholesterolemia, unspecified: Secondary | ICD-10-CM | POA: Diagnosis not present

## 2022-11-21 DIAGNOSIS — Z23 Encounter for immunization: Secondary | ICD-10-CM | POA: Diagnosis not present

## 2022-11-21 DIAGNOSIS — Z95810 Presence of automatic (implantable) cardiac defibrillator: Secondary | ICD-10-CM | POA: Diagnosis not present

## 2022-11-21 DIAGNOSIS — D6869 Other thrombophilia: Secondary | ICD-10-CM | POA: Diagnosis not present

## 2022-11-21 DIAGNOSIS — Z8674 Personal history of sudden cardiac arrest: Secondary | ICD-10-CM | POA: Diagnosis not present

## 2022-11-21 DIAGNOSIS — I7121 Aneurysm of the ascending aorta, without rupture: Secondary | ICD-10-CM | POA: Diagnosis not present

## 2022-11-21 DIAGNOSIS — N1831 Chronic kidney disease, stage 3a: Secondary | ICD-10-CM | POA: Diagnosis not present

## 2022-11-21 DIAGNOSIS — I10 Essential (primary) hypertension: Secondary | ICD-10-CM | POA: Diagnosis not present

## 2022-11-21 DIAGNOSIS — Z1331 Encounter for screening for depression: Secondary | ICD-10-CM | POA: Diagnosis not present

## 2022-11-21 DIAGNOSIS — Z0000 Encounter for general adult medical examination without abnormal findings: Secondary | ICD-10-CM | POA: Diagnosis not present

## 2022-11-21 DIAGNOSIS — I447 Left bundle-branch block, unspecified: Secondary | ICD-10-CM | POA: Diagnosis not present

## 2022-11-21 DIAGNOSIS — Z952 Presence of prosthetic heart valve: Secondary | ICD-10-CM | POA: Diagnosis not present

## 2022-12-03 NOTE — Progress Notes (Signed)
Remote ICD transmission.   

## 2023-02-05 DIAGNOSIS — H2513 Age-related nuclear cataract, bilateral: Secondary | ICD-10-CM | POA: Diagnosis not present

## 2023-02-05 DIAGNOSIS — H401131 Primary open-angle glaucoma, bilateral, mild stage: Secondary | ICD-10-CM | POA: Diagnosis not present

## 2023-02-12 ENCOUNTER — Ambulatory Visit: Payer: Medicare HMO

## 2023-02-12 ENCOUNTER — Encounter: Payer: Self-pay | Admitting: Internal Medicine

## 2023-02-12 DIAGNOSIS — I447 Left bundle-branch block, unspecified: Secondary | ICD-10-CM

## 2023-02-12 LAB — CUP PACEART REMOTE DEVICE CHECK
Battery Remaining Longevity: 90 mo
Battery Remaining Percentage: 100 %
Brady Statistic RA Percent Paced: 100 %
Brady Statistic RV Percent Paced: 12 %
Date Time Interrogation Session: 20250205035600
HighPow Impedance: 69 Ohm
Lead Channel Impedance Value: 452 Ohm
Lead Channel Impedance Value: 512 Ohm
Lead Channel Impedance Value: 665 Ohm
Lead Channel Setting Pacing Amplitude: 2 V
Lead Channel Setting Pacing Amplitude: 2.5 V
Lead Channel Setting Pacing Amplitude: 2.5 V
Lead Channel Setting Pacing Pulse Width: 0.4 ms
Lead Channel Setting Pacing Pulse Width: 0.4 ms
Lead Channel Setting Sensing Sensitivity: 0.5 mV
Lead Channel Setting Sensing Sensitivity: 1 mV
Pulse Gen Serial Number: 228146
Zone Setting Status: 755011

## 2023-02-24 ENCOUNTER — Other Ambulatory Visit: Payer: Self-pay | Admitting: Internal Medicine

## 2023-03-09 ENCOUNTER — Other Ambulatory Visit: Payer: Self-pay | Admitting: Hematology and Oncology

## 2023-03-21 NOTE — Progress Notes (Signed)
 Remote ICD transmission.

## 2023-04-21 ENCOUNTER — Other Ambulatory Visit: Payer: Self-pay | Admitting: Surgery

## 2023-04-21 DIAGNOSIS — I7121 Aneurysm of the ascending aorta, without rupture: Secondary | ICD-10-CM

## 2023-05-13 ENCOUNTER — Other Ambulatory Visit: Payer: Self-pay | Admitting: Physician Assistant

## 2023-05-13 DIAGNOSIS — I2694 Multiple subsegmental pulmonary emboli without acute cor pulmonale: Secondary | ICD-10-CM

## 2023-05-14 ENCOUNTER — Ambulatory Visit: Payer: Medicare HMO | Attending: Internal Medicine | Admitting: Internal Medicine

## 2023-05-14 ENCOUNTER — Inpatient Hospital Stay (HOSPITAL_BASED_OUTPATIENT_CLINIC_OR_DEPARTMENT_OTHER): Payer: Medicare (Managed Care) | Admitting: Physician Assistant

## 2023-05-14 ENCOUNTER — Other Ambulatory Visit: Payer: Medicare (Managed Care)

## 2023-05-14 ENCOUNTER — Ambulatory Visit: Payer: Medicare (Managed Care) | Admitting: Hematology and Oncology

## 2023-05-14 ENCOUNTER — Ambulatory Visit (INDEPENDENT_AMBULATORY_CARE_PROVIDER_SITE_OTHER): Payer: 59

## 2023-05-14 ENCOUNTER — Inpatient Hospital Stay: Payer: Medicare (Managed Care) | Attending: Physician Assistant

## 2023-05-14 ENCOUNTER — Encounter: Payer: Self-pay | Admitting: Internal Medicine

## 2023-05-14 VITALS — BP 122/98 | HR 63 | Temp 98.6°F | Resp 13 | Wt 163.6 lb

## 2023-05-14 DIAGNOSIS — I10 Essential (primary) hypertension: Secondary | ICD-10-CM

## 2023-05-14 DIAGNOSIS — Z86711 Personal history of pulmonary embolism: Secondary | ICD-10-CM | POA: Insufficient documentation

## 2023-05-14 DIAGNOSIS — Z8674 Personal history of sudden cardiac arrest: Secondary | ICD-10-CM | POA: Insufficient documentation

## 2023-05-14 DIAGNOSIS — Z7901 Long term (current) use of anticoagulants: Secondary | ICD-10-CM | POA: Insufficient documentation

## 2023-05-14 DIAGNOSIS — I2694 Multiple subsegmental pulmonary emboli without acute cor pulmonale: Secondary | ICD-10-CM

## 2023-05-14 DIAGNOSIS — Z8042 Family history of malignant neoplasm of prostate: Secondary | ICD-10-CM | POA: Insufficient documentation

## 2023-05-14 DIAGNOSIS — I428 Other cardiomyopathies: Secondary | ICD-10-CM

## 2023-05-14 DIAGNOSIS — Z833 Family history of diabetes mellitus: Secondary | ICD-10-CM | POA: Insufficient documentation

## 2023-05-14 DIAGNOSIS — Z8249 Family history of ischemic heart disease and other diseases of the circulatory system: Secondary | ICD-10-CM | POA: Diagnosis not present

## 2023-05-14 DIAGNOSIS — I2699 Other pulmonary embolism without acute cor pulmonale: Secondary | ICD-10-CM | POA: Diagnosis not present

## 2023-05-14 DIAGNOSIS — R030 Elevated blood-pressure reading, without diagnosis of hypertension: Secondary | ICD-10-CM | POA: Diagnosis not present

## 2023-05-14 LAB — CBC WITH DIFFERENTIAL (CANCER CENTER ONLY)
Abs Immature Granulocytes: 0.01 10*3/uL (ref 0.00–0.07)
Basophils Absolute: 0 10*3/uL (ref 0.0–0.1)
Basophils Relative: 1 %
Eosinophils Absolute: 0.1 10*3/uL (ref 0.0–0.5)
Eosinophils Relative: 2 %
HCT: 44.5 % (ref 39.0–52.0)
Hemoglobin: 14.6 g/dL (ref 13.0–17.0)
Immature Granulocytes: 0 %
Lymphocytes Relative: 31 %
Lymphs Abs: 1.4 10*3/uL (ref 0.7–4.0)
MCH: 28.4 pg (ref 26.0–34.0)
MCHC: 32.8 g/dL (ref 30.0–36.0)
MCV: 86.6 fL (ref 80.0–100.0)
Monocytes Absolute: 0.6 10*3/uL (ref 0.1–1.0)
Monocytes Relative: 12 %
Neutro Abs: 2.6 10*3/uL (ref 1.7–7.7)
Neutrophils Relative %: 54 %
Platelet Count: 197 10*3/uL (ref 150–400)
RBC: 5.14 MIL/uL (ref 4.22–5.81)
RDW: 14.4 % (ref 11.5–15.5)
WBC Count: 4.7 10*3/uL (ref 4.0–10.5)
nRBC: 0 % (ref 0.0–0.2)

## 2023-05-14 LAB — CUP PACEART REMOTE DEVICE CHECK
Battery Remaining Longevity: 84 mo
Battery Remaining Percentage: 97 %
Brady Statistic RA Percent Paced: 100 %
Brady Statistic RV Percent Paced: 11 %
Date Time Interrogation Session: 20250507022900
HighPow Impedance: 66 Ohm
Lead Channel Impedance Value: 452 Ohm
Lead Channel Impedance Value: 489 Ohm
Lead Channel Impedance Value: 643 Ohm
Lead Channel Setting Pacing Amplitude: 2 V
Lead Channel Setting Pacing Amplitude: 2.5 V
Lead Channel Setting Pacing Amplitude: 2.5 V
Lead Channel Setting Pacing Pulse Width: 0.4 ms
Lead Channel Setting Pacing Pulse Width: 0.4 ms
Lead Channel Setting Sensing Sensitivity: 0.5 mV
Lead Channel Setting Sensing Sensitivity: 1 mV
Pulse Gen Serial Number: 228146
Zone Setting Status: 755011

## 2023-05-14 LAB — CMP (CANCER CENTER ONLY)
ALT: 17 U/L (ref 0–44)
AST: 22 U/L (ref 15–41)
Albumin: 4.5 g/dL (ref 3.5–5.0)
Alkaline Phosphatase: 72 U/L (ref 38–126)
Anion gap: 7 (ref 5–15)
BUN: 20 mg/dL (ref 8–23)
CO2: 27 mmol/L (ref 22–32)
Calcium: 9.3 mg/dL (ref 8.9–10.3)
Chloride: 107 mmol/L (ref 98–111)
Creatinine: 1.71 mg/dL — ABNORMAL HIGH (ref 0.61–1.24)
GFR, Estimated: 43 mL/min — ABNORMAL LOW (ref >60)
Glucose, Bld: 87 mg/dL (ref 70–99)
Potassium: 4.5 mmol/L (ref 3.5–5.1)
Sodium: 141 mmol/L (ref 135–145)
Total Bilirubin: 1.5 mg/dL — ABNORMAL HIGH (ref 0.0–1.2)
Total Protein: 7.8 g/dL (ref 6.5–8.1)

## 2023-05-14 MED ORDER — APIXABAN 5 MG PO TABS: 5.0000 mg | ORAL_TABLET | Freq: Two times a day (BID) | ORAL | 2 refills | Status: AC

## 2023-05-14 NOTE — Progress Notes (Signed)
 [facility] Telephone:([phone]   Fax:(336) [phone]  PROGRESS NOTE  Patient Care Team: Faustina Hood, MD as PCP - General (Family Medicine) Arnoldo Lapping, MD as PCP - Cardiology (Cardiology) Tammie Fall, MD as PCP - Electrophysiology (Cardiology)  Hematological/Oncological History # Recurrent Pulmonary Embolism  04/04/2018: Admitted for cardiac arrest. CT PE study showed new, acute subsegmental nonocclusive pulmonary embolism in branches of the right lower lobe and right middle lobe pulmonary arteries. 05/22/2022: patient under CT angio study which showed an incidental finding of acute pulmonary emboli within segmental and subsegmental branches of the right upper lobe and left lower lobe pulmonary arteries. 07/03/2022: establish care with Dr. Rosaline Coma   Interval History:  Kevin Rojas 68 y.o. male with medical history significant for recurrent pulmonary embolism who presents for a follow up visit. The patient's last visit was on 11/14/2022 and in the interim since the last visit he has had no major changes in his health.  Kevin Rojas reports that he has been doing well overall since our last visit.  Kevin Rojas reports that he is doing well and tolerating Eliquis  therapy without any issues.  He denies any bleeding or bruising episodes.  He reports that Eliquis  is affordable at this time.  He denies any signs or symptoms of recurrent VTE such as leg pain, leg swelling, chest pain or shortness of breath.He reports that he is willing and able to continue on anticoagulation therapy at this time.  A full 10 point ROS was otherwise negative.  MEDICAL HISTORY:  Past Medical History:  Diagnosis Date   Abnormal CXR 03/24/2018   Aortic insufficiency    Cardiac arrest (HCC) 03/24/2018   Cognitive disorder    Constipation 04/07/2018   Debility 04/05/2018   Dyspepsia --felt to be due to dysmotility 04/10/2018   Dysrhythmia    Elevated troponin 03/24/2018   Endotracheally  intubated 03/24/2018   History of implantable cardioverter-defibrillator (ICD) placement 07/18/2018   History of kidney stones    Hypokalemia 03/24/2018   LBBB (left bundle branch block) 03/24/2018   Pneumonia    history of   Pulmonary embolism (HCC)    04/04/18   S/P AVR (aortic valve replacement)    Echocardiogram 07/03/2018: EF 45-50, septal-lateral dyssynchrony, mild LVH, grade 2 diastolic dysfunction, normal RVSF, mild MR, bioprosthetic AoV with no regurgitation and mildly elevated mean gradient at 18, mild dilation of the ascending aorta (42), PASP 21   Thoracic aortic aneurysm (HCC) 06/24/2018   CT 03/2018:  Ao root 4.2 cm; ascending Aorta 4.3 cm   Ventricular fibrillation (HCC) 03/24/2018    SURGICAL HISTORY: Past Surgical History:  Procedure Laterality Date   AORTIC ARCH ANGIOGRAPHY N/A 04/02/2018   Procedure: AORTIC ARCH ANGIOGRAPHY;  Surgeon: Millicent Ally, MD;  Location: [facility] INVASIVE CV LAB;  Service: Cardiovascular;  Laterality: N/A;   AORTIC VALVE REPLACEMENT N/A 06/08/2018   Procedure: AORTIC VALVE REPLACEMENT (AVR), ON PUMP, USING MAGNA EASE AORTIC BIOPROSTHESIS VALVE ;  Surgeon: Norita Beauvais, MD;  Location: [facility] OR;  Service: Open Heart Surgery;  Laterality: N/A;   BIV ICD INSERTION CRT-D N/A 07/17/2018   Procedure: BIV ICD INSERTION CRT-D;  Surgeon: Tammie Fall, MD;  Location: [facility] INVASIVE CV LAB;  Service: Cardiovascular;  Laterality: N/A;   PACEMAKER INSERTION     RIGHT/LEFT HEART CATH AND CORONARY ANGIOGRAPHY N/A 04/02/2018   Procedure: RIGHT/LEFT HEART CATH AND CORONARY ANGIOGRAPHY;  Surgeon: Millicent Ally, MD;  Location: [facility] INVASIVE CV LAB;  Service: Cardiovascular;  Laterality: N/A;   TEE WITHOUT CARDIOVERSION N/A 06/08/2018   Procedure: TRANSESOPHAGEAL ECHOCARDIOGRAM (TEE);  Surgeon: Norita Beauvais, MD;  Location: [facility] OR;  Service: Open Heart Surgery;  Laterality: N/A;    SOCIAL HISTORY: Social History   Socioeconomic History   Marital status: Married     Spouse name: Not on file   Number of children: Not on file   Years of education: Not on file   Highest education level: Not on file  Occupational History   Not on file  Tobacco Use   Smoking status: Never   Smokeless tobacco: Never  Substance and Sexual Activity   Alcohol use: Never   Drug use: Never   Sexual activity: Not on file  Other Topics Concern   Not on file  Social History Narrative   Not on file   Social Drivers of Health   Financial Resource Strain: Not on file  Food Insecurity: Not on file  Transportation Needs: Not on file  Physical Activity: Not on file  Stress: No Stress Concern Present (12/10/2019)   Received from [facility], [facility]   [employer] of Occupational Health - Occupational Stress Questionnaire    Feeling of Stress : Not at all  Social Connections: Unknown (05/21/2021)   Received from [facility], [facility]   Social Network    Social Network: Not on file  Intimate Partner Violence: Unknown (04/12/2021)   Received from [facility], [facility]   HITS    Physically Hurt: Not on file    Insult or Talk Down To: Not on file    Threaten Physical Harm: Not on file    Scream or Curse: Not on file    FAMILY HISTORY: Family History  Problem Relation Age of Onset   Pulmonary embolism Mother    Prostate cancer Father        passed awary at 55    ALLERGIES:  has no known allergies.  MEDICATIONS:  Current Outpatient Medications  Medication Sig Dispense Refill   acetaminophen  (TYLENOL ) 325 MG tablet Take 1-2 tablets (325-650 mg total) by mouth every 4 (four) hours as needed for mild pain.     amiodarone  (PACERONE ) 200 MG tablet TAKE 1 TABLET BY MOUTH DAILY MONDAY THROUGH FRIDAY( DO NOT TAKE ANY TABLETS ON SATURDAY AND SUNDAY)` 20 tablet 11   amLODipine  (NORVASC ) 2.5 MG tablet TAKE 1 TABLET(2.5 MG) BY MOUTH DAILY 90 tablet 1   losartan  (COZAAR ) 25 MG tablet TAKE 2 TABLETS(50 MG) BY MOUTH DAILY (Patient taking differently: TAKE 1  TABLETS BY MOUTH DAILY) 180 tablet 3   metoprolol  tartrate (LOPRESSOR ) 25 MG tablet TAKE 1 TABLET(25 MG) BY MOUTH TWICE DAILY 180 tablet 3   rosuvastatin  (CRESTOR ) 5 MG tablet Take 1 tablet (5 mg total) by mouth daily. 90 tablet 1   apixaban  (ELIQUIS ) 5 MG TABS tablet Take 1 tablet (5 mg total) by mouth 2 (two) times daily. 180 tablet 2   No current facility-administered medications for this visit.    REVIEW OF SYSTEMS:   Constitutional: ( - ) fevers, ( - )  chills , ( - ) night sweats Eyes: ( - ) blurriness of vision, ( - ) double vision, ( - ) watery eyes Ears, nose, mouth, throat, and face: ( - ) mucositis, ( - ) sore throat Respiratory: ( - ) cough, ( - ) dyspnea, ( - ) wheezes Cardiovascular: ( - ) palpitation, ( - ) chest discomfort, ( - ) lower extremity swelling Gastrointestinal:  ( - )  nausea, ( - ) heartburn, ( - ) change in bowel habits Skin: ( - ) abnormal skin rashes Lymphatics: ( - ) new lymphadenopathy, ( - ) easy bruising Neurological: ( - ) numbness, ( - ) tingling, ( - ) new weaknesses Behavioral/Psych: ( - ) mood change, ( - ) new changes  All other systems were reviewed with the patient and are negative.  PHYSICAL EXAMINATION:  Vitals:   05/14/23 0859  BP: (!) 122/98  Pulse: 63  Resp: 13  Temp: 98.6 F (37 C)  SpO2: 100%    Filed Weights   05/14/23 0859  Weight: 163 lb 9.6 oz (74.2 kg)     GENERAL: Well-appearing middle-age Caucasian male, alert, no distress and comfortable SKIN: skin color, texture, turgor are normal, no rashes or significant lesions EYES: conjunctiva are pink and non-injected, sclera clear LUNGS: clear to auscultation and percussion with normal breathing effort HEART: regular rate & rhythm and no murmurs and no lower extremity edema Musculoskeletal: no cyanosis of digits and no clubbing  PSYCH: alert & oriented x 3, fluent speech NEURO: no focal motor/sensory deficits  LABORATORY DATA:  I have reviewed the data as listed     Latest Ref Rng & Units 05/14/2023    8:22 AM 11/14/2022   11:04 AM 07/03/2022   10:15 AM  CBC  WBC 4.0 - 10.5 K/uL 4.7  4.2  4.5   Hemoglobin 13.0 - 17.0 g/dL 16.1  09.6  04.5   Hematocrit 39.0 - 52.0 % 44.5  45.9  47.0   Platelets 150 - 400 K/uL 197  200  218        Latest Ref Rng & Units 11/14/2022   11:04 AM 11/08/2022   11:14 AM 07/03/2022   10:15 AM  CMP  Glucose 70 - 99 mg/dL 409  79  83   BUN 8 - 23 mg/dL 12  16  15    Creatinine 0.61 - 1.24 mg/dL 8.11  9.14  7.82   Sodium 135 - 145 mmol/L 141  143  141   Potassium 3.5 - 5.1 mmol/L 4.4  4.6  4.0   Chloride 98 - 111 mmol/L 108  107  108   CO2 22 - 32 mmol/L 27  23  26    Calcium  8.9 - 10.3 mg/dL 9.3  9.2  9.5   Total Protein 6.5 - 8.1 g/dL 7.8  7.3  8.4   Total Bilirubin <1.2 mg/dL 1.2  0.9  1.3   Alkaline Phos 38 - 126 U/L 74  87  89   AST 15 - 41 U/L 17  20  16    ALT 0 - 44 U/L 16  19  14      RADIOGRAPHIC STUDIES: No results found.  ASSESSMENT & PLAN Kevin Rojas is a 68 y.o.  male with medical history significant for recurrent pulmonary embolism who presents for a follow up visit. After review of the labs, review of the records, and discussion with the patient the patients findings are most consistent with recurrent VTE's, the most recent being unprovoked.   #Unprovoked Pulmonary Embolism  # Recurrent Pulmonary Embolism  --findings at this time are consistent with a unprovoked VTE  --ruled out APS with anticardiolipin and anti beta2 glycoprotein antibodies.  Lupus anticoagulant panel would be altered by presence of blood thinner, will hold on this testing.   --recommend the patient continue eliquis  5mg  BID -- labs today show WBC 4.7, Hgb 14.6, MCV 86.6, Plt 197, Cr at 1.71 --patient  denies any bleeding, bruising, or dark stools on this medication. It is well tolerated. No difficulties accessing/affording the medication  --RTC in 6 months' time with strict return precautions for overt signs of bleeding   #Elevated  diastolic BP: --BP today was 122/98 and 123/102.  --Patient is asymptomatic.  --Advised to check BP daily at home and follow up with PCP in the next 1-2 weeks if DBP os greater than 85.   No orders of the defined types were placed in this encounter.   All questions were answered. The patient knows to call the clinic with any problems, questions or concerns.   I have spent a total of 25 minutes minutes of face-to-face and non-face-to-face time, preparing to see the patient, performing a medically appropriate examination, counseling and educating the patient, documenting clinical information in the electronic health record, independently interpreting results and communicating results to the patient, and care coordination.   Wyline Hearing PA-C Dept of Hematology and Oncology [facility] at [facility] Phone: [phone]   05/14/2023 9:06 AM

## 2023-05-15 ENCOUNTER — Encounter: Payer: Self-pay | Admitting: Internal Medicine

## 2023-05-19 ENCOUNTER — Encounter: Payer: Self-pay | Admitting: Surgery

## 2023-05-20 ENCOUNTER — Ambulatory Visit
Admission: RE | Admit: 2023-05-20 | Discharge: 2023-05-20 | Disposition: A | Discharge status: Home / Self Care | Source: Ambulatory Visit | Attending: Surgery | Admitting: Surgery

## 2023-05-20 DIAGNOSIS — I7121 Aneurysm of the ascending aorta, without rupture: Secondary | ICD-10-CM

## 2023-05-20 MED ORDER — IOPAMIDOL (ISOVUE-370) INJECTION 76%
75.0000 mL | Freq: Once | INTRAVENOUS | Status: AC | PRN
  Administered 2023-05-20: 75 mL via INTRAVENOUS

## 2023-05-21 NOTE — Progress Notes (Signed)
 [address], [place]         [place], [place] [address]             [phone]    PCP is Faustina Hood, MD Referring Provider is Faustina Hood, MD  Chief Complaint: Ascending thoracic aortic aneursym   HPI: This is a 68 year old male with a past medical history of OOH cardiac arrest in 2020, ICD, PE, LBBB, and aortic valve replacement in 2020 (by Dr. Nicanor Barge, size 25 mm pericardial tissue valve) who Has been followed in our office for an ascending thoracic aortic aneurysm since 2021. He was last seen by Dr. Sherene Dilling in May 2025 and the CTA showed stable aneurysmal dilatation of the ascending thoracic aorta measuring 4.4 cm. He denies chest pain, pressure, or tightness.   Past Medical History:  Diagnosis Date   Abnormal CXR 03/24/2018   Aortic insufficiency    Cardiac arrest (HCC) 03/24/2018   Cognitive disorder    Constipation 04/07/2018   Debility 04/05/2018   Dyspepsia --felt to be due to dysmotility 04/10/2018   Dysrhythmia    Elevated troponin 03/24/2018   Endotracheally intubated 03/24/2018   History of implantable cardioverter-defibrillator (ICD) placement 07/18/2018   History of kidney stones    Hypokalemia 03/24/2018   LBBB (left bundle branch block) 03/24/2018   Pneumonia    history of   Pulmonary embolism (HCC)    04/04/18   S/P AVR (aortic valve replacement)    Echocardiogram 07/03/2018: EF 45-50, septal-lateral dyssynchrony, mild LVH, grade 2 diastolic dysfunction, normal RVSF, mild MR, bioprosthetic AoV with no regurgitation and mildly elevated mean gradient at 18, mild dilation of the ascending aorta (42), PASP 21   Thoracic aortic aneurysm (HCC) 06/24/2018   CT 03/2018:  Ao root 4.2 cm; ascending Aorta 4.3 cm   Ventricular fibrillation (HCC) 03/24/2018    Past Surgical History:  Procedure Laterality Date   AORTIC ARCH ANGIOGRAPHY N/A 04/02/2018   Procedure: AORTIC ARCH ANGIOGRAPHY;  Surgeon: Millicent Ally, MD;  Location: [facility] INVASIVE CV LAB;  Service:  Cardiovascular;  Laterality: N/A;   AORTIC VALVE REPLACEMENT N/A 06/08/2018   Procedure: AORTIC VALVE REPLACEMENT (AVR), ON PUMP, USING MAGNA EASE AORTIC BIOPROSTHESIS VALVE ;  Surgeon: Norita Beauvais, MD;  Location: [facility] OR;  Service: Open Heart Surgery;  Laterality: N/A;   BIV ICD INSERTION CRT-D N/A 07/17/2018   Procedure: BIV ICD INSERTION CRT-D;  Surgeon: Tammie Fall, MD;  Location: [facility] INVASIVE CV LAB;  Service: Cardiovascular;  Laterality: N/A;   PACEMAKER INSERTION     RIGHT/LEFT HEART CATH AND CORONARY ANGIOGRAPHY N/A 04/02/2018   Procedure: RIGHT/LEFT HEART CATH AND CORONARY ANGIOGRAPHY;  Surgeon: Millicent Ally, MD;  Location: [facility] INVASIVE CV LAB;  Service: Cardiovascular;  Laterality: N/A;   TEE WITHOUT CARDIOVERSION N/A 06/08/2018   Procedure: TRANSESOPHAGEAL ECHOCARDIOGRAM (TEE);  Surgeon: Norita Beauvais, MD;  Location: [facility] OR;  Service: Open Heart Surgery;  Laterality: N/A;    Family History  Problem Relation Age of Onset   Pulmonary embolism Mother    Prostate cancer Father        passed awary at 110    Social History Social History   Tobacco Use   Smoking status: Never   Smokeless tobacco: Never  Substance Use Topics   Alcohol use: Never   Drug use: Never    Current Outpatient Medications  Medication Sig Dispense Refill   acetaminophen  (TYLENOL ) 325 MG tablet Take 1-2 tablets (325-650  mg total) by mouth every 4 (four) hours as needed for mild pain.     amiodarone  (PACERONE ) 200 MG tablet TAKE 1 TABLET BY MOUTH DAILY MONDAY THROUGH FRIDAY( DO NOT TAKE ANY TABLETS ON SATURDAY AND SUNDAY)` 20 tablet 11   amLODipine  (NORVASC ) 2.5 MG tablet TAKE 1 TABLET(2.5 MG) BY MOUTH DAILY 90 tablet 1   apixaban  (ELIQUIS ) 5 MG TABS tablet Take 1 tablet (5 mg total) by mouth 2 (two) times daily. 180 tablet 2   losartan  (COZAAR ) 25 MG tablet TAKE 2 TABLETS(50 MG) BY MOUTH DAILY (Patient taking differently: TAKE 1 TABLETS BY MOUTH DAILY) 180 tablet 3   metoprolol  tartrate  (LOPRESSOR ) 25 MG tablet TAKE 1 TABLET(25 MG) BY MOUTH TWICE DAILY 180 tablet 3   rosuvastatin  (CRESTOR ) 5 MG tablet Take 1 tablet (5 mg total) by mouth daily. 90 tablet 1   Allergies: No Known Allergies   Vital Signs: Vitals:   05/27/23 1358  BP: 124/88  Pulse: 66  Resp: 18  SpO2: 96%    Physical Exam CV-RRR, no murmur Neck-No carotid bruit Pulmonary-Clear to auscultation bilaterally Abdomen-Soft, non tender, bowel sounds present Extremities-no LE edema Neurologic-Grossly intact without focal deficit   Diagnostic Tests:  Narrative & Impression  CLINICAL DATA:  Follow-up aortic aneurysm   EXAM: CT ANGIOGRAPHY CHEST WITH CONTRAST   TECHNIQUE: Multidetector CT imaging of the chest was performed using the standard protocol during bolus administration of intravenous contrast. Multiplanar CT image reconstructions and MIPs were obtained to evaluate the vascular anatomy.   RADIATION DOSE REDUCTION: This exam was performed according to the departmental dose-optimization program which includes automated exposure control, adjustment of the mA and/or kV according to patient size and/or use of iterative reconstruction technique.   CONTRAST:  75mL ISOVUE -370 IOPAMIDOL  (ISOVUE -370) INJECTION 76%   COMPARISON:  CTA chest dated 05/22/2022   FINDINGS: Cardiovascular:   Aortic Root:   --Valve: 2.9 x 2.6 cm   --Sinuses: 4.2 x 4.1 x 3.9 cm   --Sinotubular Junction: 4.7 x 3.4 cm   Limitations by motion: Mild   Thoracic Aorta:   --Ascending Aorta: 4.6 x 4.6 cm, previously 4.6 x 4.6 cm (remeasured).   --Aortic Arch: 3.3 x 3.2 cm   --Descending Aorta: 3.1 x 2.8 cm. Similar tortuous proximal descending thoracic aorta.   Other:   Left chest wall ICD leads terminate in the right atrium, right ventricle, and tributary of the coronary sinus. Postsurgical changes of aortic valve replacement. Normal heart size. No pericardial effusion. No central pulmonary emboli.    Mediastinum/Nodes: Imaged thyroid  gland without nodules meeting criteria for imaging follow-up by size. Normal esophagus. No pathologically enlarged axillary, supraclavicular, mediastinal, or hilar lymph nodes.   Lungs/Pleura: The central airways are patent. No focal consolidation. No pneumothorax. No pleural effusion.   Upper abdomen: Partially imaged bilateral simple/minimally complicated renal cysts. No specific follow-up imaging recommended.   Musculoskeletal: No acute or abnormal lytic or blastic osseous lesions. Median sternotomy wires are nondisplaced. Multilevel degenerative changes of the thoracic spine.   IMPRESSION: 1. Unchanged ascending thoracic aortic aneurysm measuring 4.6 cm, when remeasured. Ascending thoracic aortic aneurysm. Recommend semi-annual imaging followup by CTA or MRA and referral to cardiothoracic surgery if not already obtained. This recommendation follows 2010 ACCF/AHA/AATS/ACR/ASA/SCA/SCAI/SIR/STS/SVM Guidelines for the Diagnosis and Management of Patients With Thoracic Aortic Disease. Circulation. 2010; 121: W098-J191. Aortic aneurysm NOS (ICD10-I71.9) 2. Postsurgical changes of aortic valve replacement.     Electronically Signed   By: Limin  Xu M.D.   On: 05/20/2023  15:08    Impression and Plan: CTA showed a 4.6 cm ascending aortic aneurysm.   Echo done in September 2020 showed LVEF 55-60%, trace MR, trivial TR, no AS, trivial AI, structurally normal aortic valve. We discussed the natural history and and risk factors for growth of ascending aortic aneurysms.  We covered the importance of smoking cessation, tight blood pressure control, refraining from lifting heavy objects, and avoiding fluoroquinolones.  The patient is aware of signs and symptoms of aortic dissection and when to present to the emergency department.  We will continue surveillance and a repeat CTA was ordered for 6 months. His aortic aneurysm is well below the surgical threshold of  5.5 cm. This will require follow-up every 6 months.     Allegra Arch, PA-C [facility] [phone]

## 2023-05-22 DIAGNOSIS — Z95810 Presence of automatic (implantable) cardiac defibrillator: Secondary | ICD-10-CM | POA: Diagnosis not present

## 2023-05-22 DIAGNOSIS — K219 Gastro-esophageal reflux disease without esophagitis: Secondary | ICD-10-CM | POA: Diagnosis not present

## 2023-05-22 DIAGNOSIS — I7121 Aneurysm of the ascending aorta, without rupture: Secondary | ICD-10-CM | POA: Diagnosis not present

## 2023-05-22 DIAGNOSIS — I480 Paroxysmal atrial fibrillation: Secondary | ICD-10-CM | POA: Diagnosis not present

## 2023-05-22 DIAGNOSIS — Z86711 Personal history of pulmonary embolism: Secondary | ICD-10-CM | POA: Diagnosis not present

## 2023-05-22 DIAGNOSIS — I447 Left bundle-branch block, unspecified: Secondary | ICD-10-CM | POA: Diagnosis not present

## 2023-05-22 DIAGNOSIS — I10 Essential (primary) hypertension: Secondary | ICD-10-CM | POA: Diagnosis not present

## 2023-05-22 DIAGNOSIS — E7800 Pure hypercholesterolemia, unspecified: Secondary | ICD-10-CM | POA: Diagnosis not present

## 2023-05-27 ENCOUNTER — Encounter: Payer: Self-pay | Admitting: Physician Assistant

## 2023-05-27 ENCOUNTER — Ambulatory Visit: Payer: Medicare (Managed Care) | Attending: Surgery | Admitting: Physician Assistant

## 2023-05-27 VITALS — BP 124/88 | HR 66 | Resp 18 | Ht 69.0 in | Wt 163.0 lb

## 2023-05-27 DIAGNOSIS — I7121 Aneurysm of the ascending aorta, without rupture: Secondary | ICD-10-CM | POA: Diagnosis not present

## 2023-05-27 NOTE — Patient Instructions (Signed)
 Risk Modification in those with ascending thoracic aortic aneurysm:  Continue good control of blood pressure (prefer SBP 130/80 or less)-continue Losartan  (Cozaar ), Amlodipine  (Norvasc ), and Metoprolol  Tartrate(Lopressor )  2. Avoid fluoroquinolone antibiotics (I.e Ciprofloxacin, Avelox, Levofloxacin, Ofloxacin)  3.  Use of statin (to decrease cardiovascular risk)-continue Rosuvastatin  (Crestor )  4.  Exercise and activity limitations is individualized, but in general, contact sports are to be  avoided and one should avoid heavy lifting (defined as half of ideal body weight) and exercises involving sustained Valsalva maneuver.  5. Counseling for those suspected of having genetically mediated disease. First-degree relatives of those with TAA disease should be screened as well as those who have a connective tissue disease (I.e with Marfan syndrome, Ehlers-Danlos syndrome,  and Loeys-Dietz syndrome) or a  bicuspid aortic valve,have an increased risk for  complications related to TAA. He denies family history of TAA and personal history of connective tissue disorder.  Echo done in September 2020 showed LVEF 55-60%, trace MR, trivial TR, no AS, trivial AI, structurally normal aortic valve, and Mild aortic dilatation 41 mm.  6. Patient with no history of tobacco abuse

## 2023-06-24 NOTE — Progress Notes (Signed)
 Remote ICD transmission.

## 2023-07-10 ENCOUNTER — Encounter: Payer: Self-pay | Admitting: Internal Medicine

## 2023-07-10 ENCOUNTER — Ambulatory Visit: Attending: Internal Medicine | Admitting: Internal Medicine

## 2023-07-10 VITALS — BP 118/80 | HR 60 | Ht 69.0 in | Wt 160.1 lb

## 2023-07-10 DIAGNOSIS — I428 Other cardiomyopathies: Secondary | ICD-10-CM | POA: Diagnosis not present

## 2023-07-10 DIAGNOSIS — I4901 Ventricular fibrillation: Secondary | ICD-10-CM

## 2023-07-10 DIAGNOSIS — I447 Left bundle-branch block, unspecified: Secondary | ICD-10-CM

## 2023-07-10 NOTE — Patient Instructions (Signed)
 Medication Instructions:  Your physician recommends that you continue on your current medications as directed. Please refer to the Current Medication list given to you today.  *If you need a refill on your cardiac medications before your next appointment, please call your pharmacy*  Lab Work: None ordered.  You may go to any [facility] Location for your lab work:  [facility] - [address] ([facility]) - [address] [id number]  [place] - [address]   [place]  - [address]   [place] - [address] - [address] [employer]  - [address] ([facility]   If you have labs (blood work) drawn today and your tests are completely normal, you will receive your results only by: [employer] (if you have MyChart)  If you have any lab test that is abnormal or we need to change your treatment, we will call you or send a MyChart message to review the results.  Testing/Procedures: None ordered.  Follow-Up: At [facility], you and your health needs are our priority.  As part of our continuing mission to provide you with exceptional heart care, we have created designated Provider Care Teams.  These Care Teams include your primary Cardiologist (physician) and Advanced Practice Providers (APPs -  Physician Assistants and Nurse Practitioners) who all work together to provide you with the care you need, when you need it.  Your next appointment:   1 year(s)  The format for your next appointment:   In Person  Provider:   Manya Sells, MD{or one of the following Advanced Practice Providers on your designated Care Team:   Kevin Rojas, [place] Kevin Rojas "Kevin Rojas" [place], [place] Kevin Balk, NP  Note: Remote monitoring is used to monitor your Pacemaker/ ICD from home. This monitoring reduces the number of office visits required to check your  device to one time per year. It allows us  to keep an eye on the functioning of your device to ensure it is working properly.

## 2023-07-10 NOTE — Progress Notes (Signed)
 HPI [name] returns today after a long absence from our device clinic. He is a pleasant 68 yo man with a h/o VF arrest and AI, s/p AVR. He has done well in the interim. He has been on amiodarone  since 2020. He denies chest pain or sob. No syncope. He denies peripheral edema. He is s/p Biv ICD insertion and appears to be doing well. No chest pain. He has been found to have a CT of the chest demonstrating a 4.6 cm aneurysm.  He is followed by Dr. Lucas. He denies ICD therapies.  Allergies  Allergen Reactions   Quinolones Other (See Comments)    Patient with ATAA (ascending thoracic aortic aneurysm) and needs to avoid this class of antibiotics     Current Outpatient Medications  Medication Sig Dispense Refill   acetaminophen  (TYLENOL ) 325 MG tablet Take 1-2 tablets (325-650 mg total) by mouth every 4 (four) hours as needed for mild pain.     amiodarone  (PACERONE ) 200 MG tablet TAKE 1 TABLET BY MOUTH DAILY MONDAY THROUGH FRIDAY( DO NOT TAKE ANY TABLETS ON SATURDAY AND SUNDAY)` 20 tablet 11   amLODipine  (NORVASC ) 2.5 MG tablet TAKE 1 TABLET(2.5 MG) BY MOUTH DAILY 90 tablet 1   apixaban  (ELIQUIS ) 5 MG TABS tablet Take 1 tablet (5 mg total) by mouth 2 (two) times daily. 180 tablet 2   losartan  (COZAAR ) 25 MG tablet TAKE 2 TABLETS(50 MG) BY MOUTH DAILY (Patient taking differently: TAKE 1 TABLETS BY MOUTH DAILY) 180 tablet 3   metoprolol  tartrate (LOPRESSOR ) 25 MG tablet TAKE 1 TABLET(25 MG) BY MOUTH TWICE DAILY 180 tablet 3   rosuvastatin  (CRESTOR ) 5 MG tablet Take 1 tablet (5 mg total) by mouth daily. 90 tablet 1   No current facility-administered medications for this visit.     Past Medical History:  Diagnosis Date   Abnormal CXR 03/24/2018   Aortic insufficiency    Cardiac arrest (HCC) 03/24/2018   Cognitive disorder    Constipation 04/07/2018   Debility 04/05/2018   Dyspepsia --felt to be due to dysmotility 04/10/2018   Dysrhythmia    Elevated troponin 03/24/2018   Endotracheally  intubated 03/24/2018   History of implantable cardioverter-defibrillator (ICD) placement 07/18/2018   History of kidney stones    Hypokalemia 03/24/2018   LBBB (left bundle branch block) 03/24/2018   Pneumonia    history of   Pulmonary embolism (HCC)    04/04/18   S/P AVR (aortic valve replacement)    Echocardiogram 07/03/2018: EF 45-50, septal-lateral dyssynchrony, mild LVH, grade 2 diastolic dysfunction, normal RVSF, mild MR, bioprosthetic AoV with no regurgitation and mildly elevated mean gradient at 18, mild dilation of the ascending aorta (42), PASP 21   Thoracic aortic aneurysm (HCC) 06/24/2018   CT 03/2018:  Ao root 4.2 cm; ascending Aorta 4.3 cm   Ventricular fibrillation (HCC) 03/24/2018    ROS:   All systems reviewed and negative except as noted in the HPI.   Past Surgical History:  Procedure Laterality Date   AORTIC ARCH ANGIOGRAPHY N/A 04/02/2018   Procedure: AORTIC ARCH ANGIOGRAPHY;  Surgeon: Burnard Debby LABOR, MD;  Location: [facility] INVASIVE CV LAB;  Service: Cardiovascular;  Laterality: N/A;   AORTIC VALVE REPLACEMENT N/A 06/08/2018   Procedure: AORTIC VALVE REPLACEMENT (AVR), ON PUMP, USING MAGNA EASE AORTIC BIOPROSTHESIS VALVE ;  Surgeon: Army Dallas NOVAK, MD;  Location: [facility] OR;  Service: Open Heart Surgery;  Laterality: N/A;   BIV ICD INSERTION CRT-D N/A 07/17/2018   Procedure:  BIV ICD INSERTION CRT-D;  Surgeon: Waddell Danelle ORN, MD;  Location: [facility] INVASIVE CV LAB;  Service: Cardiovascular;  Laterality: N/A;   PACEMAKER INSERTION     RIGHT/LEFT HEART CATH AND CORONARY ANGIOGRAPHY N/A 04/02/2018   Procedure: RIGHT/LEFT HEART CATH AND CORONARY ANGIOGRAPHY;  Surgeon: Burnard Debby LABOR, MD;  Location: [facility] INVASIVE CV LAB;  Service: Cardiovascular;  Laterality: N/A;   TEE WITHOUT CARDIOVERSION N/A 06/08/2018   Procedure: TRANSESOPHAGEAL ECHOCARDIOGRAM (TEE);  Surgeon: Army Dallas NOVAK, MD;  Location: [facility] OR;  Service: Open Heart Surgery;  Laterality: N/A;     Family History  Problem  Relation Age of Onset   Pulmonary embolism Mother    Prostate cancer Father        passed awary at 78     Social History   Socioeconomic History   Marital status: Married    Spouse name: Not on file   Number of children: Not on file   Years of education: Not on file   Highest education level: Not on file  Occupational History   Not on file  Tobacco Use   Smoking status: Never   Smokeless tobacco: Never  Substance and Sexual Activity   Alcohol use: Never   Drug use: Never   Sexual activity: Not on file  Other Topics Concern   Not on file  Social History Narrative   Not on file   Social Drivers of Health   Financial Resource Strain: Not on file  Food Insecurity: Not on file  Transportation Needs: Not on file  Physical Activity: Not on file  Stress: No Stress Concern Present (12/10/2019)   Received from [facility] Occupational [facility] - Occupational Stress Questionnaire    Feeling of Stress : Not at all  Social Connections: Unknown (05/21/2021)   Received from [facility]   Social Network    Social Network: Not on file  Intimate Partner Violence: Unknown (04/12/2021)   Received from [facility]   HITS    Physically Hurt: Not on file    Insult or Talk Down To: Not on file    Threaten Physical Harm: Not on file    Scream or Curse: Not on file     BP 118/80 (BP Location: Left Arm, Patient Position: Sitting, Cuff Size: Normal)   Pulse 60   Ht 5' 9 (1.753 m)   Wt 160 lb 1.6 oz (72.6 kg)   SpO2 98%   BMI 23.64 kg/m   Physical Exam:  Well appearing NAD HEENT: Unremarkable Neck:  No JVD, no thyromegally Lymphatics:  No adenopathy Back:  No CVA tenderness Lungs:  Clear with no wheezes HEART:  Regular rate rhythm, no murmurs, no rubs, no clicks Abd:  soft, positive bowel sounds, no organomegally, no rebound, no guarding Ext:  2 plus pulses, no edema, no cyanosis, no clubbing Skin:  No rashes no nodules Neuro:  CN II through XII  intact, motor grossly intact  EKG - NSR with AV pacing  DEVICE  Normal device function.  See PaceArt for details.   Assess/Plan:  VF arrest - interrogation of his ICD demonstrates normal Biv ICD function. He has had no recurrent VF episodes. We will have him continue his dose of amiodarone  to 200 mg daily, none on Sat/Sun.  ICD - his [employer] ICD is working normally. He has about 7 years of battery. AI - he is s/p aortic valve replacement. He has no murmur on exam today.  HTN -  his bp is well controlled.  Heart block - his PR has increased to over 350 today and his RV pacing was turned off and he had LV only in place. We have reprogrammed to add the RV to the pacing circuit with a the LV 40 ms before the RV.   Danelle Dian Laprade,MD

## 2023-07-28 ENCOUNTER — Encounter: Admitting: Internal Medicine

## 2023-08-13 ENCOUNTER — Ambulatory Visit (INDEPENDENT_AMBULATORY_CARE_PROVIDER_SITE_OTHER): Payer: 59

## 2023-08-13 DIAGNOSIS — I428 Other cardiomyopathies: Secondary | ICD-10-CM

## 2023-08-14 ENCOUNTER — Ambulatory Visit: Payer: Self-pay | Admitting: Internal Medicine

## 2023-08-14 LAB — CUP PACEART REMOTE DEVICE CHECK
Battery Remaining Longevity: 72 mo
Battery Remaining Percentage: 83 %
Brady Statistic RA Percent Paced: 100 %
Brady Statistic RV Percent Paced: 100 %
Date Time Interrogation Session: 20250806021300
HighPow Impedance: 70 Ohm
Lead Channel Impedance Value: 434 Ohm
Lead Channel Impedance Value: 487 Ohm
Lead Channel Impedance Value: 637 Ohm
Lead Channel Setting Pacing Amplitude: 2 V
Lead Channel Setting Pacing Amplitude: 2.5 V
Lead Channel Setting Pacing Amplitude: 2.5 V
Lead Channel Setting Pacing Pulse Width: 0.4 ms
Lead Channel Setting Pacing Pulse Width: 0.4 ms
Lead Channel Setting Sensing Sensitivity: 0.5 mV
Lead Channel Setting Sensing Sensitivity: 1 mV
Pulse Gen Serial Number: 228146
Zone Setting Status: 755011

## 2023-10-06 NOTE — Progress Notes (Signed)
 Remote ICD Transmission

## 2023-10-17 DIAGNOSIS — I509 Heart failure, unspecified: Secondary | ICD-10-CM | POA: Diagnosis not present

## 2023-10-17 DIAGNOSIS — N1832 Chronic kidney disease, stage 3b: Secondary | ICD-10-CM | POA: Diagnosis not present

## 2023-10-17 DIAGNOSIS — I4891 Unspecified atrial fibrillation: Secondary | ICD-10-CM | POA: Diagnosis not present

## 2023-10-17 DIAGNOSIS — Z95810 Presence of automatic (implantable) cardiac defibrillator: Secondary | ICD-10-CM | POA: Diagnosis not present

## 2023-10-17 DIAGNOSIS — K219 Gastro-esophageal reflux disease without esophagitis: Secondary | ICD-10-CM | POA: Diagnosis not present

## 2023-10-17 DIAGNOSIS — E785 Hyperlipidemia, unspecified: Secondary | ICD-10-CM | POA: Diagnosis not present

## 2023-10-17 DIAGNOSIS — I130 Hypertensive heart and chronic kidney disease with heart failure and stage 1 through stage 4 chronic kidney disease, or unspecified chronic kidney disease: Secondary | ICD-10-CM | POA: Diagnosis not present

## 2023-10-17 DIAGNOSIS — D6869 Other thrombophilia: Secondary | ICD-10-CM | POA: Diagnosis not present

## 2023-10-17 DIAGNOSIS — Z7901 Long term (current) use of anticoagulants: Secondary | ICD-10-CM | POA: Diagnosis not present

## 2023-10-21 ENCOUNTER — Other Ambulatory Visit: Payer: Self-pay | Admitting: Surgery

## 2023-10-21 DIAGNOSIS — I7121 Aneurysm of the ascending aorta, without rupture: Secondary | ICD-10-CM

## 2023-11-11 ENCOUNTER — Other Ambulatory Visit: Payer: Self-pay | Admitting: Hematology and Oncology

## 2023-11-11 DIAGNOSIS — Z86711 Personal history of pulmonary embolism: Secondary | ICD-10-CM

## 2023-11-11 DIAGNOSIS — I2694 Multiple subsegmental pulmonary emboli without acute cor pulmonale: Secondary | ICD-10-CM

## 2023-11-11 NOTE — Progress Notes (Unsigned)
 [facility] [phone]   [phone]  PROGRESS NOTE  Patient Care Team: Claudene Pellet, MD as PCP - General (Family Medicine) Wonda Sharper, MD as PCP - Cardiology (Cardiology) Waddell Danelle ORN, MD as PCP - Electrophysiology (Cardiology)  Hematological/Oncological History # Recurrent Pulmonary Embolism  04/04/2018: Admitted for cardiac arrest. CT PE study showed new, acute subsegmental nonocclusive pulmonary embolism in branches of the right lower lobe and right middle lobe pulmonary arteries. 05/22/2022: patient under CT angio study which showed an incidental finding of acute pulmonary emboli within segmental and subsegmental branches of the right upper lobe and left lower lobe pulmonary arteries. 07/03/2022: establish care with Dr. Federico   Interval History:  Kevin Rojas 68 y.o. male with medical history significant for recurrent pulmonary embolism who presents for a follow up visit. The patient's last visit was on 07/03/2022 at which time he established care. In the interim since the last visit he has had no major changes in his health.  Kevin Rojas reports that he has been doing well overall since our last visit.  He reports he is had no hospitalizations, emergency room visits, or changes in his medications.  He continues taking his Eliquis  5 mg twice daily without any difficulty.  He is not having any bleeding, bruising, or dark stools.  He has not noticed any side effects as a result of his anticoagulation treatment.  He reports a 52-month supply typically cost about $12.  He notes he is not have any signs or symptoms concerning for recurrent VTE such as leg pain, leg swelling, chest pain, or shortness of breath.  He reports that he is willing and able to continue on anticoagulation therapy at this time.  A full 10 point ROS was otherwise negative.  MEDICAL HISTORY:  Past Medical History:  Diagnosis Date   Abnormal CXR 03/24/2018   Aortic insufficiency     Cardiac arrest (HCC) 03/24/2018   Cognitive disorder    Constipation 04/07/2018   Debility 04/05/2018   Dyspepsia --felt to be due to dysmotility 04/10/2018   Dysrhythmia    Elevated troponin 03/24/2018   Endotracheally intubated 03/24/2018   History of implantable cardioverter-defibrillator (ICD) placement 07/18/2018   History of kidney stones    Hypokalemia 03/24/2018   LBBB (left bundle branch block) 03/24/2018   Pneumonia    history of   Pulmonary embolism (HCC)    04/04/18   S/P AVR (aortic valve replacement)    Echocardiogram 07/03/2018: EF 45-50, septal-lateral dyssynchrony, mild LVH, grade 2 diastolic dysfunction, normal RVSF, mild MR, bioprosthetic AoV with no regurgitation and mildly elevated mean gradient at 18, mild dilation of the ascending aorta (42), PASP 21   Thoracic aortic aneurysm 06/24/2018   CT 03/2018:  Ao root 4.2 cm; ascending Aorta 4.3 cm   Ventricular fibrillation (HCC) 03/24/2018    SURGICAL HISTORY: Past Surgical History:  Procedure Laterality Date   AORTIC ARCH ANGIOGRAPHY N/A 04/02/2018   Procedure: AORTIC ARCH ANGIOGRAPHY;  Surgeon: Burnard Debby LABOR, MD;  Location: [facility] INVASIVE CV LAB;  Service: Cardiovascular;  Laterality: N/A;   AORTIC VALVE REPLACEMENT N/A 06/08/2018   Procedure: AORTIC VALVE REPLACEMENT (AVR), ON PUMP, USING MAGNA EASE AORTIC BIOPROSTHESIS VALVE ;  Surgeon: Army Dallas NOVAK, MD;  Location: [facility] OR;  Service: Open Heart Surgery;  Laterality: N/A;   BIV ICD INSERTION CRT-D N/A 07/17/2018   Procedure: BIV ICD INSERTION CRT-D;  Surgeon: Waddell Danelle ORN, MD;  Location: [facility] INVASIVE CV LAB;  Service: Cardiovascular;  Laterality: N/A;   PACEMAKER INSERTION     RIGHT/LEFT HEART CATH AND CORONARY ANGIOGRAPHY N/A 04/02/2018   Procedure: RIGHT/LEFT HEART CATH AND CORONARY ANGIOGRAPHY;  Surgeon: Burnard Debby LABOR, MD;  Location: [facility] INVASIVE CV LAB;  Service: Cardiovascular;  Laterality: N/A;   TEE WITHOUT CARDIOVERSION N/A 06/08/2018   Procedure:  TRANSESOPHAGEAL ECHOCARDIOGRAM (TEE);  Surgeon: Army Dallas NOVAK, MD;  Location: [facility] OR;  Service: Open Heart Surgery;  Laterality: N/A;    SOCIAL HISTORY: Social History   Socioeconomic History   Marital status: Married    Spouse name: Not on file   Number of children: Not on file   Years of education: Not on file   Highest education level: Not on file  Occupational History   Not on file  Tobacco Use   Smoking status: Never   Smokeless tobacco: Never  Substance and Sexual Activity   Alcohol use: Never   Drug use: Never   Sexual activity: Not on file  Other Topics Concern   Not on file  Social History Narrative   Not on file   Social Drivers of Health   Financial Resource Strain: Not on file  Food Insecurity: Not on file  Transportation Needs: Not on file  Physical Activity: Not on file  Stress: No Stress Concern Present (12/10/2019)   Received from [facility] - Occupational Stress Questionnaire    Feeling of Stress : Not at all  Social Connections: Unknown (05/21/2021)   Received from [facility]   Social Network    Social Network: Not on file  Intimate Partner Violence: Unknown (04/12/2021)   Received from [facility]   HITS    Physically Hurt: Not on file    Insult or Talk Down To: Not on file    Threaten Physical Harm: Not on file    Scream or Curse: Not on file    FAMILY HISTORY: Family History  Problem Relation Age of Onset   Pulmonary embolism Mother    Prostate cancer Father        passed awary at 47    ALLERGIES:  is allergic to quinolones.  MEDICATIONS:  Current Outpatient Medications  Medication Sig Dispense Refill   acetaminophen  (TYLENOL ) 325 MG tablet Take 1-2 tablets (325-650 mg total) by mouth every 4 (four) hours as needed for mild pain.     amiodarone  (PACERONE ) 200 MG tablet TAKE 1 TABLET BY MOUTH DAILY MONDAY THROUGH FRIDAY( DO NOT TAKE ANY TABLETS ON SATURDAY AND SUNDAY)` 20 tablet 11    amLODipine  (NORVASC ) 2.5 MG tablet TAKE 1 TABLET(2.5 MG) BY MOUTH DAILY 90 tablet 1   apixaban  (ELIQUIS ) 5 MG TABS tablet Take 1 tablet (5 mg total) by mouth 2 (two) times daily. 180 tablet 2   losartan  (COZAAR ) 25 MG tablet TAKE 2 TABLETS(50 MG) BY MOUTH DAILY (Patient taking differently: TAKE 1 TABLETS BY MOUTH DAILY) 180 tablet 3   metoprolol  tartrate (LOPRESSOR ) 25 MG tablet TAKE 1 TABLET(25 MG) BY MOUTH TWICE DAILY 180 tablet 3   rosuvastatin  (CRESTOR ) 5 MG tablet Take 1 tablet (5 mg total) by mouth daily. 90 tablet 1   No current facility-administered medications for this visit.    REVIEW OF SYSTEMS:   Constitutional: ( - ) fevers, ( - )  chills , ( - ) night sweats Eyes: ( - ) blurriness of vision, ( - ) double vision, ( - ) watery eyes Ears, nose, mouth, throat, and face: ( - )  mucositis, ( - ) sore throat Respiratory: ( - ) cough, ( - ) dyspnea, ( - ) wheezes Cardiovascular: ( - ) palpitation, ( - ) chest discomfort, ( - ) lower extremity swelling Gastrointestinal:  ( - ) nausea, ( - ) heartburn, ( - ) change in bowel habits Skin: ( - ) abnormal skin rashes Lymphatics: ( - ) new lymphadenopathy, ( - ) easy bruising Neurological: ( - ) numbness, ( - ) tingling, ( - ) new weaknesses Behavioral/Psych: ( - ) mood change, ( - ) new changes  All other systems were reviewed with the patient and are negative.  PHYSICAL EXAMINATION:  There were no vitals filed for this visit.  There were no vitals filed for this visit.   GENERAL: Well-appearing middle-age Caucasian male, alert, no distress and comfortable SKIN: skin color, texture, turgor are normal, no rashes or significant lesions EYES: conjunctiva are pink and non-injected, sclera clear LUNGS: clear to auscultation and percussion with normal breathing effort HEART: regular rate & rhythm and no murmurs and no lower extremity edema Musculoskeletal: no cyanosis of digits and no clubbing  PSYCH: alert & oriented x 3, fluent  speech NEURO: no focal motor/sensory deficits  LABORATORY DATA:  I have reviewed the data as listed    Latest Ref Rng & Units 05/14/2023    8:22 AM 11/14/2022   11:04 AM 07/03/2022   10:15 AM  CBC  WBC 4.0 - 10.5 K/uL 4.7  4.2  4.5   Hemoglobin 13.0 - 17.0 g/dL 85.3  84.7  84.8   Hematocrit 39.0 - 52.0 % 44.5  45.9  47.0   Platelets 150 - 400 K/uL 197  200  218        Latest Ref Rng & Units 05/14/2023    8:22 AM 11/14/2022   11:04 AM 11/08/2022   11:14 AM  CMP  Glucose 70 - 99 mg/dL 87  897  79   BUN 8 - 23 mg/dL 20  12  16    Creatinine 0.61 - 1.24 mg/dL 8.28  8.46  8.41   Sodium 135 - 145 mmol/L 141  141  143   Potassium 3.5 - 5.1 mmol/L 4.5  4.4  4.6   Chloride 98 - 111 mmol/L 107  108  107   CO2 22 - 32 mmol/L 27  27  23    Calcium  8.9 - 10.3 mg/dL 9.3  9.3  9.2   Total Protein 6.5 - 8.1 g/dL 7.8  7.8  7.3   Total Bilirubin 0.0 - 1.2 mg/dL 1.5  1.2  0.9   Alkaline Phos 38 - 126 U/L 72  74  87   AST 15 - 41 U/L 22  17  20    ALT 0 - 44 U/L 17  16  19      RADIOGRAPHIC STUDIES: No results found.  ASSESSMENT & PLAN Kevin Rojas 68 y.o. male with medical history significant for recurrent pulmonary embolism who presents for a follow up visit.   After review of the labs, review of the records, and discussion with the patient the patients findings are most consistent with recurrent VTE's, the most recent being unprovoked.   A provoked venous thromboembolism (VTE) is one that has a clear inciting factor or event. Provoking factors include prolonged travel/immobility, surgery (particular abdominal or orthropedic), trauma,  and pregnancy/ estrogen containing birth control. After a detailed history and review of the records there is no clear provoking factor for this patient's VTE.  Patients with unprovoked  VTEs have up to 25% recurrence after 5 years and 36% at 10 years, with 4% of these clots being fatal (BMJ?2019;366:[id number]). Therefore the formal recommendation for unprovoked VTE's is  lifelong anticoagulation, as the cause may not be transient or reversible. We recommend 6 months or full strength anticoagulation with a re-evaluation after that time.  The patient's will then have a choice of maintenance dose DOAC (preferred, recommended), 81mg  ASA PO daily (non-preferred), or no further anticoagulation (not recommended).     #Unprovoked Pulmonary Embolism  # Recurrent Pulmonary Embolism  --findings at this time are consistent with a unprovoked VTE  --ruled out APS with anticardiolipin and anti beta2 glycoprotein antibodies.  Lupus anticoagulant panel would be altered by presence of blood thinner, will hold on this testing.   --recommend the patient continue eliquis  5mg  BID -- labs today show WBC 4.2, Hgb 15.2, MCV 86.9, Plt 200, Cr 1.53 --patient denies any bleeding, bruising, or dark stools on this medication. It is well tolerated. No difficulties accessing/affording the medication  --RTC in 6 months' time with strict return precautions for overt signs of bleeding  No orders of the defined types were placed in this encounter.   All questions were answered. The patient knows to call the clinic with any problems, questions or concerns.  A total of more than 25 minutes were spent on this encounter with face-to-face time and non-face-to-face time, including preparing to see the patient, ordering tests and/or medications, counseling the patient and coordination of care as outlined above.   Norleen IVAR Kidney, MD Department of Hematology/Oncology [facility] at [facility] Phone: [phone] Pager: [phone] Email: [email or web address]  11/11/2023 9:30 PM

## 2023-11-12 ENCOUNTER — Inpatient Hospital Stay: Attending: Hematology and Oncology

## 2023-11-12 ENCOUNTER — Inpatient Hospital Stay

## 2023-11-12 ENCOUNTER — Inpatient Hospital Stay: Admitting: Hematology and Oncology

## 2023-11-12 ENCOUNTER — Ambulatory Visit: Payer: 59

## 2023-11-12 ENCOUNTER — Telehealth: Payer: Self-pay | Admitting: *Deleted

## 2023-11-12 VITALS — BP 113/87 | HR 60 | Temp 98.0°F | Resp 13 | Wt 160.8 lb

## 2023-11-12 DIAGNOSIS — Z8674 Personal history of sudden cardiac arrest: Secondary | ICD-10-CM | POA: Diagnosis not present

## 2023-11-12 DIAGNOSIS — Z7901 Long term (current) use of anticoagulants: Secondary | ICD-10-CM | POA: Insufficient documentation

## 2023-11-12 DIAGNOSIS — I428 Other cardiomyopathies: Secondary | ICD-10-CM | POA: Diagnosis not present

## 2023-11-12 DIAGNOSIS — Z8249 Family history of ischemic heart disease and other diseases of the circulatory system: Secondary | ICD-10-CM | POA: Insufficient documentation

## 2023-11-12 DIAGNOSIS — Z8042 Family history of malignant neoplasm of prostate: Secondary | ICD-10-CM | POA: Insufficient documentation

## 2023-11-12 DIAGNOSIS — I2694 Multiple subsegmental pulmonary emboli without acute cor pulmonale: Secondary | ICD-10-CM

## 2023-11-12 DIAGNOSIS — D509 Iron deficiency anemia, unspecified: Secondary | ICD-10-CM | POA: Insufficient documentation

## 2023-11-12 DIAGNOSIS — Z833 Family history of diabetes mellitus: Secondary | ICD-10-CM | POA: Insufficient documentation

## 2023-11-12 DIAGNOSIS — D500 Iron deficiency anemia secondary to blood loss (chronic): Secondary | ICD-10-CM

## 2023-11-12 DIAGNOSIS — Z86711 Personal history of pulmonary embolism: Secondary | ICD-10-CM | POA: Diagnosis not present

## 2023-11-12 LAB — CMP (CANCER CENTER ONLY)
ALT: 14 U/L (ref 0–44)
AST: 18 U/L (ref 15–41)
Albumin: 4.1 g/dL (ref 3.5–5.0)
Alkaline Phosphatase: 70 U/L (ref 38–126)
Anion gap: 5 (ref 5–15)
BUN: 13 mg/dL (ref 8–23)
CO2: 27 mmol/L (ref 22–32)
Calcium: 9 mg/dL (ref 8.9–10.3)
Chloride: 108 mmol/L (ref 98–111)
Creatinine: 1.5 mg/dL — ABNORMAL HIGH (ref 0.61–1.24)
GFR, Estimated: 51 mL/min — ABNORMAL LOW (ref >60)
Glucose, Bld: 110 mg/dL — ABNORMAL HIGH (ref 70–99)
Potassium: 4.6 mmol/L (ref 3.5–5.1)
Sodium: 140 mmol/L (ref 135–145)
Total Bilirubin: 0.8 mg/dL (ref 0.0–1.2)
Total Protein: 7.5 g/dL (ref 6.5–8.1)

## 2023-11-12 LAB — VITAMIN B12: Vitamin B-12: 282 pg/mL (ref 180–914)

## 2023-11-12 LAB — CBC WITH DIFFERENTIAL (CANCER CENTER ONLY)
Abs Immature Granulocytes: 0.01 K/uL (ref 0.00–0.07)
Basophils Absolute: 0 K/uL (ref 0.0–0.1)
Basophils Relative: 1 %
Eosinophils Absolute: 0.1 K/uL (ref 0.0–0.5)
Eosinophils Relative: 1 %
HCT: 36 % — ABNORMAL LOW (ref 39.0–52.0)
Hemoglobin: 11.4 g/dL — ABNORMAL LOW (ref 13.0–17.0)
Immature Granulocytes: 0 %
Lymphocytes Relative: 31 %
Lymphs Abs: 1.4 K/uL (ref 0.7–4.0)
MCH: 26.9 pg (ref 26.0–34.0)
MCHC: 31.7 g/dL (ref 30.0–36.0)
MCV: 84.9 fL (ref 80.0–100.0)
Monocytes Absolute: 0.5 K/uL (ref 0.1–1.0)
Monocytes Relative: 11 %
Neutro Abs: 2.5 K/uL (ref 1.7–7.7)
Neutrophils Relative %: 56 %
Platelet Count: 254 K/uL (ref 150–400)
RBC: 4.24 MIL/uL (ref 4.22–5.81)
RDW: 14.2 % (ref 11.5–15.5)
WBC Count: 4.5 K/uL (ref 4.0–10.5)
nRBC: 0 % (ref 0.0–0.2)

## 2023-11-12 LAB — IRON AND IRON BINDING CAPACITY (CC-WL,HP ONLY)
Iron: 37 ug/dL — ABNORMAL LOW (ref 45–182)
Saturation Ratios: 9 % — ABNORMAL LOW (ref 17.9–39.5)
TIBC: 400 ug/dL (ref 250–450)
UIBC: 363 ug/dL (ref 117–376)

## 2023-11-12 LAB — LACTATE DEHYDROGENASE: LDH: 186 U/L (ref 98–192)

## 2023-11-12 LAB — FERRITIN: Ferritin: 17 ng/mL — ABNORMAL LOW (ref 24–336)

## 2023-11-12 LAB — FOLATE: Folate: 10.5 ng/mL (ref >5.9)

## 2023-11-12 MED ORDER — VITAMIN B-12 1000 MCG PO TABS
1000.0000 ug | ORAL_TABLET | Freq: Every day | ORAL | 2 refills | Status: AC
Start: 2023-11-12 — End: ?

## 2023-11-12 NOTE — Telephone Encounter (Signed)
 TCT patient regarding lab results from today.  No answer but was able to leave detailed vm message on his identified phone. Advised that he has iron deficiency and borderline low Vitamin b12. Dr. Federico will set him up for IV iron infusion. Also advised Dr. Federico has  prescribed  vitamin b12 1000 mcg PO daily . We will let his GI doctor know of his lab results. Advised to calll back tomorrow to review this  to [phone]

## 2023-11-13 ENCOUNTER — Other Ambulatory Visit (HOSPITAL_COMMUNITY): Payer: Self-pay | Admitting: Hematology and Oncology

## 2023-11-13 ENCOUNTER — Telehealth: Payer: Self-pay | Admitting: Pharmacy Technician

## 2023-11-13 LAB — CUP PACEART REMOTE DEVICE CHECK
Battery Remaining Longevity: 66 mo
Battery Remaining Percentage: 78 %
Brady Statistic RA Percent Paced: 100 %
Brady Statistic RV Percent Paced: 100 %
Date Time Interrogation Session: 20251105034400
HighPow Impedance: 62 Ohm
Lead Channel Impedance Value: 435 Ohm
Lead Channel Impedance Value: 452 Ohm
Lead Channel Impedance Value: 620 Ohm
Lead Channel Setting Pacing Amplitude: 2 V
Lead Channel Setting Pacing Amplitude: 2.5 V
Lead Channel Setting Pacing Amplitude: 2.5 V
Lead Channel Setting Pacing Pulse Width: 0.4 ms
Lead Channel Setting Pacing Pulse Width: 0.4 ms
Lead Channel Setting Sensing Sensitivity: 0.5 mV
Lead Channel Setting Sensing Sensitivity: 1 mV
Pulse Gen Serial Number: 228146
Zone Setting Status: 755011

## 2023-11-13 NOTE — Progress Notes (Signed)
 Remote ICD Transmission

## 2023-11-13 NOTE — Telephone Encounter (Signed)
 Dr. Federico, Monoferric is non preferred and will be denied if patient has not failed preferred medication.  Preferred medication is venofer.  Would you like to try Venofer?  Auth Submission: NO AUTH NEEDED Site of care: Site of care: [facility] Payer: [employer] MEDICARE Medication & CPT/J Code(s) submitted: Venofer (Iron Sucrose) J1756 Diagnosis Code:  Route of submission (phone, fax, portal):  Phone # Fax # Auth type: Buy/Bill PB Units/visits requested:  Reference number:  Approval from: 11/13/23 to 01/07/24

## 2023-11-14 ENCOUNTER — Other Ambulatory Visit: Payer: Self-pay | Admitting: Internal Medicine

## 2023-11-14 ENCOUNTER — Ambulatory Visit: Payer: Self-pay | Admitting: Internal Medicine

## 2023-11-14 NOTE — Telephone Encounter (Signed)
 Auth Submission: NO AUTH NEEDED Site of care: Site of care: [facility] Payer: [employer] MEDICARE Medication & CPT/J Code(s) submitted: Venofer (Iron Sucrose) J1756 Diagnosis Code: D50.9 Route of submission (phone, fax, portal):  Phone # Fax # Auth type: Buy/Bill PB Units/visits requested: 200mg  x 5 doses Reference number:  Approval from: 11/14/23 to 01/07/24

## 2023-11-17 LAB — METHYLMALONIC ACID, SERUM: Methylmalonic Acid, Quantitative: 354 nmol/L (ref 0–378)

## 2023-11-18 ENCOUNTER — Other Ambulatory Visit: Payer: Self-pay | Admitting: Hematology and Oncology

## 2023-11-24 ENCOUNTER — Ambulatory Visit

## 2023-11-24 VITALS — BP 115/80 | HR 60 | Temp 97.6°F | Resp 12 | Ht 69.0 in | Wt 163.0 lb

## 2023-11-24 DIAGNOSIS — D500 Iron deficiency anemia secondary to blood loss (chronic): Secondary | ICD-10-CM | POA: Diagnosis not present

## 2023-11-24 MED ORDER — IRON SUCROSE 200 MG IVPB - SIMPLE MED
200.0000 mg | Freq: Once | Status: AC
  Administered 2023-11-24: 200 mg via INTRAVENOUS

## 2023-11-24 NOTE — Progress Notes (Signed)
 Diagnosis: Iron Deficiency Anemia  Provider:  Praveen Mannam MD  Procedure: IV Infusion  IV Type: Peripheral, IV Location: L Antecubital  Venofer (Iron Sucrose), Dose: 200 mg  Infusion Start Time: 0901  Infusion Stop Time: 0917  Post Infusion IV Care: Observation period completed and Peripheral IV Discontinued  Discharge: Condition: Good, Destination: Home . AVS Provided  Performed by:  Maximiano JONELLE Pouch, LPN

## 2023-11-25 ENCOUNTER — Encounter: Payer: Self-pay | Admitting: Hematology and Oncology

## 2023-11-26 ENCOUNTER — Ambulatory Visit (HOSPITAL_COMMUNITY)
Admission: RE | Admit: 2023-11-26 | Discharge: 2023-11-26 | Disposition: A | Discharge status: Home / Self Care | Source: Ambulatory Visit | Attending: Cardiology | Admitting: Cardiology

## 2023-11-26 DIAGNOSIS — I7121 Aneurysm of the ascending aorta, without rupture: Secondary | ICD-10-CM | POA: Insufficient documentation

## 2023-11-26 DIAGNOSIS — I517 Cardiomegaly: Secondary | ICD-10-CM | POA: Diagnosis not present

## 2023-11-26 MED ORDER — IOHEXOL 350 MG/ML SOLN
75.0000 mL | Freq: Once | INTRAVENOUS | Status: AC | PRN
  Administered 2023-11-26: 75 mL via INTRAVENOUS

## 2023-12-01 ENCOUNTER — Ambulatory Visit

## 2023-12-01 VITALS — BP 119/75 | HR 60 | Temp 97.7°F | Resp 16 | Ht 69.0 in | Wt 161.8 lb

## 2023-12-01 DIAGNOSIS — D500 Iron deficiency anemia secondary to blood loss (chronic): Secondary | ICD-10-CM

## 2023-12-01 MED ORDER — SODIUM CHLORIDE 0.9 % IV SOLN
200.0000 mg | Freq: Once | INTRAVENOUS | Status: AC
  Administered 2023-12-01: 200 mg via INTRAVENOUS
  Filled 2023-12-01: qty 10

## 2023-12-01 NOTE — Progress Notes (Signed)
 Diagnosis: Iron  Deficiency Anemia  Provider:  Praveen Mannam MD  Procedure: IV Infusion  IV Type: Peripheral, IV Location: R Antecubital  Venofer  (Iron  Sucrose), Dose: 200 mg  Infusion Start Time: 0913  Infusion Stop Time: 0930  Post Infusion IV Care: Observation period completed and Peripheral IV Discontinued  Discharge: Condition: Good, Destination: Home . AVS Declined  Performed by:  Maximiano JONELLE Pouch, LPN

## 2023-12-03 ENCOUNTER — Ambulatory Visit

## 2023-12-03 VITALS — BP 116/80 | HR 61 | Resp 18 | Ht 69.0 in | Wt 160.0 lb

## 2023-12-03 DIAGNOSIS — I7121 Aneurysm of the ascending aorta, without rupture: Secondary | ICD-10-CM | POA: Diagnosis not present

## 2023-12-03 NOTE — Patient Instructions (Signed)
 Risk Modification in those with ascending thoracic aortic aneurysm:   Continue control of blood pressure (prefer BP 130/80 or less)   2. Avoid fluoroquinolone antibiotics (I.e Ciprofloxacin, Avelox, Levofloxacin, Ofloxacin)   3.  Use of statin (to decrease cardiovascular risk)   4.  Exercise and activity limitations is individualized, but in general, contact sports are to be avoided and one should avoid heavy lifting (defined as half of ideal body weight) and exercises involving sustained Valsalva maneuver.   5.  Follow-up in 6 months with CTA chest.  OK to use a non-contrast CT if you have had a recent study for surveillance of the lung nodule.

## 2023-12-03 NOTE — Progress Notes (Signed)
 [address] [place] [address]             [phone]            MINARD MILLIRONS [phone] [birth year]   History of Present Illness:  Kevin Rojas is a 68 year old man with medical history of hypertension, pulmonary embolism, cardiac arrest, s/p implantable cardioverter-defibrillator placement, s/p aortic valv replacement with [facility], size 25 mm in 2020 with Dr. Army.  He presents today for continued follow up of ascending thoracic aortic aneurysm.  Aneurysm has measured 4.3 cm - 4.6 cm over the past 5 years.  On recent CTA of chest aneurysm measured 4.5 cm.   He presents to the clinic today with his wife and reports that he is doing well.  His blood pressure is controlled with current medications. His home readings are consistently under 130s/80s.  He is trying to be more active with walking for exercise. He also cuts wood for his fireplace.  He denies chest pain, shortness of breath and lower leg swelling.    Current Outpatient Medications on File Prior to Visit  Medication Sig Dispense Refill   acetaminophen  (TYLENOL ) 325 MG tablet Take 1-2 tablets (325-650 mg total) by mouth every 4 (four) hours as needed for mild pain.     amiodarone  (PACERONE ) 200 MG tablet TAKE 1 TABLET BY MOUTH DAILY ON MONDAY THROUGH FRIDAY. DO NOT TAKE ANY TABLETS ON SATURDAY AND SUNDAY 25 tablet 8   amLODipine  (NORVASC ) 2.5 MG tablet TAKE 1 TABLET(2.5 MG) BY MOUTH DAILY 90 tablet 1   apixaban  (ELIQUIS ) 5 MG TABS tablet Take 1 tablet (5 mg total) by mouth 2 (two) times daily. 180 tablet 2   aspirin  EC 81 MG tablet Take 81 mg by mouth daily.     Cholecalciferol 50 MCG (2000 UT) CAPS Take 1 capsule by mouth daily.     cyanocobalamin (VITAMIN B12) 1000 MCG tablet Take 1 tablet (1,000 mcg total) by mouth daily. 90 tablet 2   losartan  (COZAAR ) 25 MG tablet TAKE 2 TABLETS(50 MG) BY MOUTH DAILY (Patient taking differently: TAKE 1 TABLETS BY MOUTH DAILY) 180  tablet 3   metoprolol  tartrate (LOPRESSOR ) 25 MG tablet TAKE 1 TABLET(25 MG) BY MOUTH TWICE DAILY 180 tablet 3   rosuvastatin  (CRESTOR ) 5 MG tablet Take 1 tablet (5 mg total) by mouth daily. 90 tablet 1   No current facility-administered medications on file prior to visit.     ROS: Review of Systems  Constitutional: Negative.  Negative for malaise/fatigue.  Respiratory: Negative.  Negative for cough and shortness of breath.   Cardiovascular: Negative.  Negative for chest pain, palpitations and leg swelling.     BP 116/80 (BP Location: Left Arm)   Pulse 61   Resp 18   Ht 5' 9 (1.753 m)   Wt 160 lb (72.6 kg)   SpO2 98%   BMI 23.63 kg/m   Physical Exam Constitutional:      Appearance: Normal appearance.  HENT:     Head: Normocephalic and atraumatic.  Cardiovascular:     Rate and Rhythm: Normal rate and regular rhythm.     Heart sounds: Normal heart sounds, S1 normal and S2 normal.  Pulmonary:     Effort: Pulmonary effort is normal.     Breath sounds: Normal breath sounds.  Skin:    General: Skin is warm and dry.  Neurological:     General: No  focal deficit present.     Mental Status: He is alert and oriented to person, place, and time.      Imaging: CLINICAL DATA:  Follow-up aortic aneurysm.   EXAM: CT ANGIOGRAPHY CHEST WITH CONTRAST   TECHNIQUE: Multidetector CT imaging of the chest was performed using the standard protocol during bolus administration of intravenous contrast. Multiplanar CT image reconstructions and MIPs were obtained to evaluate the vascular anatomy.   RADIATION DOSE REDUCTION: This exam was performed according to the departmental dose-optimization program which includes automated exposure control, adjustment of the mA and/or kV according to patient size and/or use of iterative reconstruction technique.   CONTRAST:  75mL OMNIPAQUE  IOHEXOL  350 MG/ML SOLN   COMPARISON:  CT dated 05/20/2023.   FINDINGS: Cardiovascular: There is mild  cardiomegaly. No pericardial effusion. Left pectoral pacemaker device. Mechanical aortic valve. Dilated ascending aorta measures approximately 4.5 cm in maximal diameter. No aortic dissection. The origins of the great vessels of the aortic arch are patent. No pulmonary artery embolus identified.   Mediastinum/Nodes: No hilar or mediastinal adenopathy. The esophagus is grossly unremarkable. No mediastinal fluid collection.   Lungs/Pleura: Minimal left lung base atelectasis/scarring. No focal consolidation, pleural effusion, pneumothorax. The central airways are patent.   Upper Abdomen: No acute abnormality.   Musculoskeletal: No acute osseous pathology. Median sternotomy wires.   Review of the MIP images confirms the above findings.   IMPRESSION: 1. No acute intrathoracic pathology. 2. Dilated ascending aorta measures approximately 4.5 cm in maximal diameter. Ascending thoracic aortic aneurysm. Recommend semi-annual imaging followup by CTA or MRA and referral to cardiothoracic surgery if not already obtained. This recommendation follows 2010 ACCF/AHA/AATS/ACR/ASA/SCA/SCAI/SIR/STS/SVM Guidelines for the Diagnosis and Management of Patients With Thoracic Aortic Disease. Circulation. 2010; 121: Z733-z630. Aortic aneurysm NOS (ICD10-I71.9)     Electronically Signed   By: Vanetta Chou M.D.   On: 11/26/2023 12:45     A/P:  Aneurysm of ascending aorta without rupture -4.5 cm ascending thoracic aortic aneurysm on CTA of chest.  -We discussed the natural history and and risk factors for growth of ascending aortic aneurysms. Discussed recommendations to minimize the risk of further expansion or dissection including careful blood pressure control, avoidance of contact sports and heavy lifting, attention to lipid management.  We covered the importance of staying never user of tobacco.  The patient does not yet meet surgical criteria of >5.5cm. The patient is aware of signs and  symptoms of aortic dissection and when to present to the emergency department   -Follow up in 6 months with CTA of chest for continued surveillance    Risk Modification:  Statin:  rosuvastatin    Smoking cessation instruction/counseling given:  never user  Patient was counseled on importance of Blood Pressure Control  They are instructed to contact their Primary Care Physician if they start to have blood pressure readings over 130s/90s. Do not ever stop blood pressure medications on your own, unless instructed by healthcare professional.  Please avoid use of Fluoroquinolones as this can potentially increase your risk of Aortic Rupture and/or Dissection  Patient educated on signs and symptoms of Aortic Dissection, handout also provided in AVS  Kevin CHRISTELLA Rough, PA-C 12/03/23

## 2023-12-08 ENCOUNTER — Ambulatory Visit (INDEPENDENT_AMBULATORY_CARE_PROVIDER_SITE_OTHER)

## 2023-12-08 VITALS — BP 104/74 | HR 59 | Temp 97.7°F | Resp 16 | Ht 69.0 in | Wt 165.2 lb

## 2023-12-08 DIAGNOSIS — D500 Iron deficiency anemia secondary to blood loss (chronic): Secondary | ICD-10-CM | POA: Diagnosis not present

## 2023-12-08 MED ORDER — IRON SUCROSE 200 MG IVPB - SIMPLE MED
200.0000 mg | Freq: Once | Status: AC
  Administered 2023-12-08: 200 mg via INTRAVENOUS
  Filled 2023-12-08: qty 110

## 2023-12-08 NOTE — Progress Notes (Signed)
 Diagnosis: Iron  Deficiency Anemia  Provider:  Mannam, Praveen MD  Procedure: IV Infusion  IV Type: Peripheral, IV Location: L Antecubital   Venofer  (Iron  Sucrose), Dose: 200 mg  Infusion Start Time: 0910  Infusion Stop Time: 0926  Post Infusion IV Care: Observation period completed and Peripheral IV Discontinued  Discharge: Condition: Good, Destination: Home . AVS Declined  Performed by:  Maximiano JONELLE Pouch, LPN

## 2023-12-15 ENCOUNTER — Ambulatory Visit (INDEPENDENT_AMBULATORY_CARE_PROVIDER_SITE_OTHER)

## 2023-12-15 VITALS — BP 125/87 | HR 58 | Temp 98.1°F | Resp 16 | Ht 69.0 in | Wt 162.6 lb

## 2023-12-15 DIAGNOSIS — D500 Iron deficiency anemia secondary to blood loss (chronic): Secondary | ICD-10-CM

## 2023-12-15 MED ORDER — IRON SUCROSE 200 MG IVPB - SIMPLE MED
200.0000 mg | Freq: Once | Status: AC
  Administered 2023-12-15: 200 mg via INTRAVENOUS
  Filled 2023-12-15: qty 110

## 2023-12-15 NOTE — Progress Notes (Signed)
 Diagnosis:  Iron  Deficiency Anemia  Provider:  Praveen Mannam MD  Procedure: IV Infusion  IV Type: Peripheral, IV Location: L Antecubital   Venofer  (Iron  Sucrose), Dose: 200 mg  Infusion Start Time: 0915  Infusion Stop Time: 0931  Post Infusion IV Care: Observation period completed and Peripheral IV Discontinued  Discharge: Condition: Good, Destination: Home . AVS Declined  Performed by:  Maximiano JONELLE Pouch, LPN

## 2023-12-22 ENCOUNTER — Ambulatory Visit

## 2023-12-29 ENCOUNTER — Ambulatory Visit

## 2023-12-29 VITALS — BP 136/91 | HR 58 | Temp 98.2°F | Resp 18 | Ht 69.0 in | Wt 164.0 lb

## 2023-12-29 DIAGNOSIS — D500 Iron deficiency anemia secondary to blood loss (chronic): Secondary | ICD-10-CM

## 2023-12-29 MED ORDER — IRON SUCROSE 200 MG IVPB - SIMPLE MED
200.0000 mg | Freq: Once | Status: DC
  Filled 2023-12-29: qty 110

## 2023-12-29 NOTE — Progress Notes (Signed)
 Pt here for Venofer  200mg  infusion. However, after multiple attempts, unable to obtain IV access. Pt rescheduled for tomorrow 12/30/23.

## 2023-12-30 ENCOUNTER — Ambulatory Visit (INDEPENDENT_AMBULATORY_CARE_PROVIDER_SITE_OTHER)

## 2023-12-30 VITALS — BP 125/82 | HR 60 | Temp 97.6°F | Resp 16 | Ht 69.0 in | Wt 165.0 lb

## 2023-12-30 DIAGNOSIS — D500 Iron deficiency anemia secondary to blood loss (chronic): Secondary | ICD-10-CM | POA: Diagnosis not present

## 2023-12-30 MED ORDER — IRON SUCROSE 200 MG IVPB - SIMPLE MED
200.0000 mg | Freq: Once | Status: AC
  Administered 2023-12-30: 200 mg via INTRAVENOUS
  Filled 2023-12-30: qty 110

## 2023-12-30 NOTE — Progress Notes (Signed)
 Diagnosis:  Iron  Deficiency Anemia  Provider:  Praveen Mannam MD  Procedure: IV Infusion  IV Type: Peripheral, IV Location: L Antecubital   Venofer  (Iron  Sucrose), Dose: 200 mg  Infusion Start Time: 1524  Infusion Stop Time: 1602  Post Infusion IV Care: Patient declined observation and Peripheral IV Discontinued  Discharge: Condition: Good, Destination: Home . AVS Declined  Performed by:  Maximiano JONELLE Pouch, LPN

## 2024-02-11 ENCOUNTER — Ambulatory Visit

## 2024-02-12 LAB — CUP PACEART REMOTE DEVICE CHECK
Battery Remaining Longevity: 66 mo
Battery Remaining Percentage: 75 %
Brady Statistic RA Percent Paced: 100 %
Brady Statistic RV Percent Paced: 100 %
Date Time Interrogation Session: 20260204064300
HighPow Impedance: 67 Ohm
Lead Channel Impedance Value: 442 Ohm
Lead Channel Impedance Value: 470 Ohm
Lead Channel Impedance Value: 659 Ohm
Lead Channel Setting Pacing Amplitude: 2 V
Lead Channel Setting Pacing Amplitude: 2.5 V
Lead Channel Setting Pacing Amplitude: 2.5 V
Lead Channel Setting Pacing Pulse Width: 0.4 ms
Lead Channel Setting Pacing Pulse Width: 0.4 ms
Lead Channel Setting Sensing Sensitivity: 0.5 mV
Lead Channel Setting Sensing Sensitivity: 1 mV
Pulse Gen Serial Number: 228146
Zone Setting Status: 755011

## 2024-02-18 ENCOUNTER — Inpatient Hospital Stay: Admitting: Hematology and Oncology

## 2024-02-18 ENCOUNTER — Inpatient Hospital Stay: Attending: Hematology and Oncology

## 2024-05-12 ENCOUNTER — Ambulatory Visit

## 2024-08-11 ENCOUNTER — Ambulatory Visit

## 2024-11-10 ENCOUNTER — Ambulatory Visit

## 2025-02-09 ENCOUNTER — Ambulatory Visit
